# Patient Record
Sex: Male | Born: 1940 | Race: White | Hispanic: No | Marital: Married | Smoking: Never smoker
Health system: Southern US, Community
[De-identification: ages and names within clinical notes are randomized; demographics above are authoritative.]

## PROBLEM LIST (undated history)

## (undated) DIAGNOSIS — B029 Zoster without complications: Secondary | ICD-10-CM

## (undated) DIAGNOSIS — R197 Diarrhea, unspecified: Secondary | ICD-10-CM

## (undated) DIAGNOSIS — M1990 Unspecified osteoarthritis, unspecified site: Secondary | ICD-10-CM

## (undated) DIAGNOSIS — I10 Essential (primary) hypertension: Secondary | ICD-10-CM

## (undated) DIAGNOSIS — E785 Hyperlipidemia, unspecified: Secondary | ICD-10-CM

## (undated) DIAGNOSIS — R339 Retention of urine, unspecified: Secondary | ICD-10-CM

## (undated) DIAGNOSIS — R7309 Other abnormal glucose: Secondary | ICD-10-CM

## (undated) DIAGNOSIS — J189 Pneumonia, unspecified organism: Secondary | ICD-10-CM

## (undated) DIAGNOSIS — L309 Dermatitis, unspecified: Secondary | ICD-10-CM

## (undated) DIAGNOSIS — N189 Chronic kidney disease, unspecified: Secondary | ICD-10-CM

## (undated) DIAGNOSIS — K529 Noninfective gastroenteritis and colitis, unspecified: Secondary | ICD-10-CM

## (undated) HISTORY — DX: Other abnormal glucose: R73.09

## (undated) HISTORY — DX: Hyperlipidemia, unspecified: E78.5

## (undated) HISTORY — DX: Essential (primary) hypertension: I10

## (undated) HISTORY — DX: Noninfective gastroenteritis and colitis, unspecified: K52.9

## (undated) HISTORY — PX: TONSILLECTOMY: SUR1361

## (undated) HISTORY — DX: Zoster without complications: B02.9

## (undated) HISTORY — DX: Retention of urine, unspecified: R33.9

## (undated) HISTORY — DX: Dermatitis, unspecified: L30.9

## (undated) HISTORY — DX: Diarrhea, unspecified: R19.7

---

## 2005-03-01 ENCOUNTER — Ambulatory Visit: Payer: Self-pay | Admitting: Gastroenterology

## 2005-03-15 ENCOUNTER — Ambulatory Visit: Payer: Self-pay | Admitting: Gastroenterology

## 2005-03-15 ENCOUNTER — Encounter (INDEPENDENT_AMBULATORY_CARE_PROVIDER_SITE_OTHER): Payer: Self-pay | Admitting: Specialist

## 2005-04-13 ENCOUNTER — Ambulatory Visit: Payer: Self-pay | Admitting: Gastroenterology

## 2005-05-18 ENCOUNTER — Ambulatory Visit: Payer: Self-pay | Admitting: Gastroenterology

## 2005-06-14 ENCOUNTER — Ambulatory Visit: Payer: Self-pay | Admitting: Gastroenterology

## 2005-07-15 ENCOUNTER — Ambulatory Visit: Payer: Self-pay | Admitting: Gastroenterology

## 2005-09-09 ENCOUNTER — Ambulatory Visit: Payer: Self-pay | Admitting: Internal Medicine

## 2005-11-28 ENCOUNTER — Ambulatory Visit: Payer: Self-pay | Admitting: Internal Medicine

## 2005-11-28 LAB — CONVERTED CEMR LAB
BUN: 13 mg/dL (ref 6–23)
HDL: 41.2 mg/dL (ref >39.0)
LDL DIRECT: 154.6 mg/dL
PSA: 0.42 ng/mL (ref 0.10–4.00)
Potassium: 4.5 meq/L (ref 3.5–5.1)
Sed Rate: 10 mm/hr (ref 0–20)
Sodium: 139 meq/L (ref 135–145)
Triglyceride fasting, serum: 384 mg/dL (ref 0–149)

## 2005-12-09 ENCOUNTER — Ambulatory Visit: Payer: Self-pay | Admitting: Internal Medicine

## 2006-03-17 ENCOUNTER — Ambulatory Visit: Payer: Self-pay | Admitting: Internal Medicine

## 2006-06-16 ENCOUNTER — Ambulatory Visit: Payer: Self-pay | Admitting: Internal Medicine

## 2006-06-16 LAB — CONVERTED CEMR LAB
Chloride: 101 meq/L (ref 96–112)
Creatinine, Ser: 0.9 mg/dL (ref 0.4–1.5)
GFR calc non Af Amer: 89 mL/min
Glucose, Bld: 106 mg/dL — ABNORMAL HIGH (ref 70–99)
Potassium: 4.3 meq/L (ref 3.5–5.1)
Sodium: 141 meq/L (ref 135–145)

## 2006-06-23 ENCOUNTER — Ambulatory Visit: Payer: Self-pay | Admitting: Internal Medicine

## 2006-11-09 ENCOUNTER — Encounter: Payer: Self-pay | Admitting: *Deleted

## 2006-11-09 DIAGNOSIS — I10 Essential (primary) hypertension: Secondary | ICD-10-CM

## 2006-12-08 ENCOUNTER — Ambulatory Visit: Payer: Self-pay | Admitting: Internal Medicine

## 2007-01-04 DIAGNOSIS — B029 Zoster without complications: Secondary | ICD-10-CM

## 2007-01-04 HISTORY — DX: Zoster without complications: B02.9

## 2007-06-08 ENCOUNTER — Ambulatory Visit: Payer: Self-pay | Admitting: Internal Medicine

## 2007-11-17 ENCOUNTER — Ambulatory Visit: Payer: Self-pay | Admitting: Family Medicine

## 2007-11-17 ENCOUNTER — Telehealth: Payer: Self-pay | Admitting: Family Medicine

## 2007-11-17 DIAGNOSIS — B029 Zoster without complications: Secondary | ICD-10-CM | POA: Insufficient documentation

## 2007-11-19 ENCOUNTER — Ambulatory Visit: Payer: Self-pay | Admitting: Internal Medicine

## 2007-11-19 DIAGNOSIS — R531 Weakness: Secondary | ICD-10-CM

## 2007-11-26 ENCOUNTER — Telehealth: Payer: Self-pay | Admitting: Internal Medicine

## 2007-11-30 ENCOUNTER — Telehealth: Payer: Self-pay | Admitting: Family Medicine

## 2007-12-05 ENCOUNTER — Telehealth: Payer: Self-pay | Admitting: Internal Medicine

## 2008-01-10 ENCOUNTER — Encounter: Payer: Self-pay | Admitting: Internal Medicine

## 2008-01-25 ENCOUNTER — Ambulatory Visit: Payer: Self-pay | Admitting: Internal Medicine

## 2008-01-29 LAB — CONVERTED CEMR LAB
ALT: 25 units/L (ref 0–53)
AST: 24 units/L (ref 0–37)
Alkaline Phosphatase: 34 units/L — ABNORMAL LOW (ref 39–117)
CO2: 29 meq/L (ref 19–32)
Chloride: 100 meq/L (ref 96–112)
Cholesterol: 245 mg/dL (ref 0–200)
GFR calc non Af Amer: 79 mL/min
Potassium: 3.9 meq/L (ref 3.5–5.1)
Total Bilirubin: 0.8 mg/dL (ref 0.3–1.2)
Total CHOL/HDL Ratio: 6.4
VLDL: 69 mg/dL — ABNORMAL HIGH (ref 0–40)

## 2008-02-01 ENCOUNTER — Ambulatory Visit: Payer: Self-pay | Admitting: Internal Medicine

## 2008-02-01 DIAGNOSIS — E785 Hyperlipidemia, unspecified: Secondary | ICD-10-CM

## 2008-02-01 DIAGNOSIS — R7309 Other abnormal glucose: Secondary | ICD-10-CM | POA: Insufficient documentation

## 2008-02-28 ENCOUNTER — Encounter: Payer: Self-pay | Admitting: Internal Medicine

## 2008-03-11 ENCOUNTER — Encounter: Payer: Self-pay | Admitting: Internal Medicine

## 2008-04-15 ENCOUNTER — Ambulatory Visit: Payer: Self-pay | Admitting: Internal Medicine

## 2008-05-23 ENCOUNTER — Ambulatory Visit: Payer: Self-pay | Admitting: Internal Medicine

## 2008-05-26 LAB — CONVERTED CEMR LAB
ALT: 26 units/L (ref 0–53)
AST: 22 units/L (ref 0–37)
Albumin: 4.7 g/dL (ref 3.5–5.2)
BUN: 22 mg/dL (ref 6–23)
CO2: 28 meq/L (ref 19–32)
Chloride: 104 meq/L (ref 96–112)
Cholesterol: 262 mg/dL — ABNORMAL HIGH (ref 0–200)
Creatinine, Ser: 1.1 mg/dL (ref 0.4–1.5)
Glucose, Bld: 101 mg/dL — ABNORMAL HIGH (ref 70–99)
Potassium: 4.1 meq/L (ref 3.5–5.1)
Total Bilirubin: 0.7 mg/dL (ref 0.3–1.2)
Total CHOL/HDL Ratio: 6
Total Protein: 7.1 g/dL (ref 6.0–8.3)

## 2008-05-30 ENCOUNTER — Ambulatory Visit: Payer: Self-pay | Admitting: Internal Medicine

## 2008-05-30 DIAGNOSIS — L298 Other pruritus: Secondary | ICD-10-CM

## 2008-07-28 ENCOUNTER — Encounter: Payer: Self-pay | Admitting: Internal Medicine

## 2008-11-28 ENCOUNTER — Ambulatory Visit: Payer: Self-pay | Admitting: Internal Medicine

## 2008-12-01 LAB — CONVERTED CEMR LAB
BUN: 27 mg/dL — ABNORMAL HIGH (ref 6–23)
Basophils Absolute: 0.1 10*3/uL (ref 0.0–0.1)
Basophils Relative: 0.9 % (ref 0.0–3.0)
CO2: 25 meq/L (ref 19–32)
Calcium: 9.6 mg/dL (ref 8.4–10.5)
Chloride: 105 meq/L (ref 96–112)
Cholesterol: 311 mg/dL — ABNORMAL HIGH (ref 0–200)
Creatinine, Ser: 1.2 mg/dL (ref 0.4–1.5)
Direct LDL: 168.8 mg/dL
Eosinophils Absolute: 0.1 10*3/uL (ref 0.0–0.7)
Eosinophils Relative: 1.4 % (ref 0.0–5.0)
GFR calc non Af Amer: 63.58 mL/min (ref >60)
Glucose, Bld: 114 mg/dL — ABNORMAL HIGH (ref 70–99)
HCT: 34.2 % — ABNORMAL LOW (ref 39.0–52.0)
HDL: 43 mg/dL (ref >39.00)
Hemoglobin: 11.9 g/dL — ABNORMAL LOW (ref 13.0–17.0)
Hgb A1c MFr Bld: 5.6 % (ref 4.6–6.5)
Lymphocytes Relative: 19.5 % (ref 12.0–46.0)
Lymphs Abs: 1.5 10*3/uL (ref 0.7–4.0)
MCHC: 34.7 g/dL (ref 30.0–36.0)
MCV: 98 fL (ref 78.0–100.0)
Monocytes Absolute: 0.6 10*3/uL (ref 0.1–1.0)
Monocytes Relative: 7.3 % (ref 3.0–12.0)
Neutro Abs: 5.4 10*3/uL (ref 1.4–7.7)
Neutrophils Relative %: 70.9 % (ref 43.0–77.0)
PSA: 0.52 ng/mL (ref 0.10–4.00)
Platelets: 203 10*3/uL (ref 150.0–400.0)
Potassium: 4.5 meq/L (ref 3.5–5.1)
RBC: 3.49 M/uL — ABNORMAL LOW (ref 4.22–5.81)
RDW: 12.1 % (ref 11.5–14.6)
Sodium: 140 meq/L (ref 135–145)
TSH: 0.58 microintl units/mL (ref 0.35–5.50)
Total CHOL/HDL Ratio: 7
Triglycerides: 418 mg/dL — ABNORMAL HIGH (ref 0.0–149.0)
VLDL: 83.6 mg/dL — ABNORMAL HIGH (ref 0.0–40.0)
WBC: 7.7 10*3/uL (ref 4.5–10.5)

## 2008-12-05 ENCOUNTER — Ambulatory Visit: Payer: Self-pay | Admitting: Internal Medicine

## 2009-03-20 ENCOUNTER — Ambulatory Visit: Payer: Self-pay | Admitting: Internal Medicine

## 2009-03-23 LAB — CONVERTED CEMR LAB
Calcium: 9.1 mg/dL (ref 8.4–10.5)
Cholesterol: 251 mg/dL — ABNORMAL HIGH (ref 0–200)
Creatinine, Ser: 1.4 mg/dL (ref 0.4–1.5)
Triglycerides: 370 mg/dL — ABNORMAL HIGH (ref 0.0–149.0)

## 2009-03-27 ENCOUNTER — Ambulatory Visit: Payer: Self-pay | Admitting: Internal Medicine

## 2009-03-27 DIAGNOSIS — R197 Diarrhea, unspecified: Secondary | ICD-10-CM

## 2009-08-24 ENCOUNTER — Telehealth: Payer: Self-pay | Admitting: Internal Medicine

## 2009-09-25 ENCOUNTER — Ambulatory Visit: Payer: Self-pay | Admitting: Internal Medicine

## 2009-09-25 LAB — CONVERTED CEMR LAB
ALT: 22 units/L (ref 0–53)
BUN: 24 mg/dL — ABNORMAL HIGH (ref 6–23)
CO2: 24 meq/L (ref 19–32)
Chloride: 104 meq/L (ref 96–112)
Cholesterol: 255 mg/dL — ABNORMAL HIGH (ref 0–200)
Creatinine, Ser: 1.3 mg/dL (ref 0.4–1.5)
Direct LDL: 144.8 mg/dL
Total Protein: 6.7 g/dL (ref 6.0–8.3)
Triglycerides: 265 mg/dL — ABNORMAL HIGH (ref 0.0–149.0)

## 2009-10-02 ENCOUNTER — Ambulatory Visit: Payer: Self-pay | Admitting: Internal Medicine

## 2010-01-20 ENCOUNTER — Telehealth: Payer: Self-pay | Admitting: Internal Medicine

## 2010-02-02 NOTE — Assessment & Plan Note (Signed)
Summary: 3 MONTH FOLLOW UP   Vital Signs:  Patient profile:   70 year old male Weight:      176 pounds BMI:     3449.69 Temp:     97.8 degrees F oral Pulse rate:   83 / minute BP sitting:   132 / 74  (left arm)  Vitals Entered By: Tora Perches (March 27, 2009 3:42 PM) CC: f/u Is Patient Diabetic? No   Primary Care Provider:  Tresa Garter MD  CC:  f/u.  History of Present Illness: The patient presents for a follow up of hypertension, rash, hyperlipidemia   Preventive Screening-Counseling & Management  Alcohol-Tobacco     Smoking Status: never  Current Medications (verified): 1)  Verapamil Hcl Cr 240 Mg  Tbcr (Verapamil Hcl) .... Take One Tablet Once Daily 2)  Chlorthalidone 100 Mg  Tabs (Chlorthalidone) .... Take One Tablet Once Daily 3)  Welchol 625 Mg  Tabs (Colesevelam Hcl) .... 3 Two Times A Day By Mouth 4)  Lisinopril 40 Mg Tabs (Lisinopril) .Marland Kitchen.. 1 By Mouth Once Daily 5)  Vitamin D3 1000 Unit  Tabs (Cholecalciferol) .Marland Kitchen.. 1 Qd 6)  Bactroban 2 % Oint (Mupirocin) .... Apply To Affected Area Two Times A Day As Needed 7)  Triamcinolone Acetonide 0.1 % Oint (Triamcinolone Acetonide) .... Use 1-2 Times A Day  Allergies (verified): No Known Drug Allergies  Past History:  Past Medical History: Last updated: 02/01/2008 HYPERTENSION (ICD-401.9) COLITIS, HX OF (ICD-V12.79) H zoster 2009 Elev glu  Hyperlipidemia  Social History: Last updated: 12/05/2008 Occupation:- car dealership sales Married Never Smoked  Physical Exam  General:  alert, well-developed, well-nourished, and cooperative to examination.    Nose:  External nasal examination shows no deformity or inflammation. Nasal mucosa are pink and moist without lesions or exudates. Mouth:  Oral mucosa and oropharynx without lesions or exudates.  Teeth in good repair. Lungs:  normal respiratory effort, no intercostal retractions, no accessory muscle use, no wheezes, and L base crackles.   Heart:  normal  rate, regular rhythm, no murmur, and no rub. BLE without edema. normal DP pulses and normal cap refill in all 4 extremities    Abdomen:  Bowel sounds positive,abdomen soft and non-tender without masses, organomegaly or hernias noted. Msk:  No deformity or scoliosis noted of thoracic or lumbar spine.   Extremities:  No clubbing, cyanosis, edema, or deformity noted with normal full range of motion of all joints.   Neurologic:  No cranial nerve deficits noted. Station and gait are normal. Plantar reflexes are down-going bilaterally. DTRs are symmetrical throughout. Sensory, motor and coordinative functions appear intact. Skin:  Erythematous throat mucosa and intranasal erythema. patches on face and scalp  Cervical Nodes:  No lymphadenopathy noted Psych:  Cognition and judgment appear intact. Alert and cooperative with normal attention span and concentration. No apparent delusions, illusions, hallucinations   Impression & Recommendations:  Problem # 1:  HYPERLIPIDEMIA (ICD-272.4) Assessment Improved  His updated medication list for this problem includes:    Welchol 625 Mg Tabs (Colesevelam hcl) .Marland KitchenMarland KitchenMarland KitchenMarland Kitchen 3 two times a day by mouth Refusing statins Labs Reviewed: SGOT: 22 (05/23/2008)   SGPT: 26 (05/23/2008)   HDL:42.80 (03/20/2009), 43.00 (11/28/2008)  LDL:DEL (01/25/2008), DEL (11/28/2005)  Chol:251 (03/20/2009), 311 (11/28/2008)  Trig:370.0 (03/20/2009), 418.0 (11/28/2008)  Problem # 2:  FACIAL RASH (ICD-782.1) Assessment: Improved  His updated medication list for this problem includes:    Triamcinolone Acetonide 0.1 % Oint (Triamcinolone acetonide) ..... Use 1-2 times a day  Problem #  3:  HYPERTENSION (ICD-401.9) Assessment: Improved  His updated medication list for this problem includes:    Verapamil Hcl Cr 240 Mg Tbcr (Verapamil hcl) .Marland Kitchen... Take one tablet once daily    Chlorthalidone 100 Mg Tabs (Chlorthalidone) .Marland Kitchen... Take one tablet once daily    Lisinopril 40 Mg Tabs (Lisinopril)  .Marland Kitchen... 1 by mouth once daily  Problem # 4:  DIARRHEA, CHRONIC (ICD-787.91) Assessment: Improved On prescription/OTC  therapy   Complete Medication List: 1)  Verapamil Hcl Cr 240 Mg Tbcr (Verapamil hcl) .... Take one tablet once daily 2)  Chlorthalidone 100 Mg Tabs (Chlorthalidone) .... Take one tablet once daily 3)  Welchol 625 Mg Tabs (Colesevelam hcl) .... 3 two times a day by mouth 4)  Lisinopril 40 Mg Tabs (Lisinopril) .Marland Kitchen.. 1 by mouth once daily 5)  Vitamin D3 1000 Unit Tabs (Cholecalciferol) .Marland Kitchen.. 1 qd 6)  Bactroban 2 % Oint (Mupirocin) .... Apply to affected area two times a day as needed 7)  Triamcinolone Acetonide 0.1 % Oint (Triamcinolone acetonide) .... Use 1-2 times a day  Patient Instructions: 1)  Please schedule a follow-up appointment in 6 months. 2)  BMP prior to visit, ICD-9: 3)  Hepatic Panel prior to visit, ICD-9: 4)  Lipid Panel prior to visit, ICD-9:272.2

## 2010-02-02 NOTE — Progress Notes (Signed)
Summary: Rf Bactroban  Phone Note Refill Request   Refills Requested: Medication #1:  BACTROBAN 2 % OINT apply to affected area two times a day as needed   Dosage confirmed as above?Dosage Confirmed   Supply Requested: 44 gram orig RX was in 2010   Method Requested: Electronic Initial call taken by: Lanier Prude, [facility]),  August 24, 2009 5:14 PM  Follow-up for Phone Call        ok 3 ref Follow-up by: Tresa Garter MD,  August 24, 2009 5:37 PM    Prescriptions: BACTROBAN 2 % OINT (MUPIROCIN) apply to affected area two times a day as needed  #45 g x 3   Entered by:   Lucious Groves CMA   Authorized by:   Tresa Garter MD   Signed by:   Lucious Groves CMA on 08/25/2009   Method used:   [facility] to        [facility]  [phone]* (retail)       [address] [place], [place]  [address]       Ph: [phone] or [phone]       Fax: [phone]   RxID:   [id number]

## 2010-02-02 NOTE — Assessment & Plan Note (Signed)
Summary: 6 MO ROV /NWS #   Vital Signs:  Patient profile:   70 year old male Height:      72 inches Weight:      177 pounds BMI:     24.09 Temp:     97.4 degrees F oral Pulse rate:   88 / minute Pulse rhythm:   regular Resp:     16 per minute BP sitting:   140 / 78  (left arm) Cuff size:   regular  Vitals Entered By: Lanier Prude, CMA(AAMA) (October 02, 2009 10:38 AM) CC: 6 mo f/u Is Patient Diabetic? No   Primary Care Provider:  Tresa Garter MD  CC:  6 mo f/u.  History of Present Illness: The patient presents for a follow up of hypertension, diarrhea, hyperlipidemia  Preventive Screening-Counseling & Management  Alcohol-Tobacco     Smoking Status: never  Current Medications (verified): 1)  Verapamil Hcl Cr 240 Mg  Tbcr (Verapamil Hcl) .... Take One Tablet Once Daily 2)  Chlorthalidone 100 Mg  Tabs (Chlorthalidone) .... Take One Tablet Once Daily 3)  Welchol 625 Mg  Tabs (Colesevelam Hcl) .... 3 Two Times A Day By Mouth 4)  Lisinopril 40 Mg Tabs (Lisinopril) .Marland Kitchen.. 1 By Mouth Once Daily 5)  Vitamin D3 1000 Unit  Tabs (Cholecalciferol) .Marland Kitchen.. 1 Qd 6)  Bactroban 2 % Oint (Mupirocin) .... Apply To Affected Area Two Times A Day As Needed 7)  Triamcinolone Acetonide 0.1 % Oint (Triamcinolone Acetonide) .... Use 1-2 Times A Day  Allergies (verified): No Known Drug Allergies  Past History:  Past Medical History: Last updated: 02/01/2008 HYPERTENSION (ICD-401.9) COLITIS, HX OF (ICD-V12.79) H zoster 2009 Elev glu  Hyperlipidemia  Social History: Last updated: 12/05/2008 Occupation:- car dealership sales Married Never Smoked  Review of Systems  The patient denies abdominal pain, chest pain, syncope, and dyspnea on exertion.    Physical Exam  General:  alert, well-developed, well-nourished, and cooperative to examination.    Mouth:  Oral mucosa and oropharynx without lesions or exudates.  Teeth in good repair. Heart:  normal rate, regular rhythm, no  murmur, and no rub. BLE without edema. normal DP pulses and normal cap refill in all 4 extremities    Abdomen:  Bowel sounds positive,abdomen soft and non-tender without masses, organomegaly or hernias noted. Msk:  No deformity or scoliosis noted of thoracic or lumbar spine.   Extremities:  No clubbing, cyanosis, edema, or deformity noted with normal full range of motion of all joints.   Skin:  erythematous  patches on face and scalp  Psych:  Cognition and judgment appear intact. Alert and cooperative with normal attention span and concentration. No apparent delusions, illusions, hallucinations   Impression & Recommendations:  Problem # 1:  HYPERLIPIDEMIA (ICD-272.4) Assessment Unchanged  Not interested in other Rx His updated medication list for this problem includes:    Welchol 625 Mg Tabs (Colesevelam hcl) .Marland KitchenMarland KitchenMarland KitchenMarland Kitchen 3 two times a day by mouth  Labs Reviewed: SGOT: 21 (09/25/2009)   SGPT: 22 (09/25/2009)   HDL:41.70 (09/25/2009), 42.80 (03/20/2009)  LDL:DEL (01/25/2008), DEL (11/28/2005)  Chol:255 (09/25/2009), 251 (03/20/2009)  Trig:265.0 (09/25/2009), 370.0 (03/20/2009)  Problem # 2:  FACIAL RASH (ICD-782.1) Assessment: Improved  His updated medication list for this problem includes:    Triamcinolone Acetonide 0.1 % Oint (Triamcinolone acetonide) ..... Use 1-2 times a day  Problem # 3:  ABNORMAL GLUCOSE NEC (ICD-790.29) Assessment: Improved  Problem # 4:  COLITIS, HX OF (ICD-V12.79) Assessment: Unchanged  Problem # 5:  HYPERTENSION (ICD-401.9) Assessment: Unchanged  His updated medication list for this problem includes:    Verapamil Hcl Cr 240 Mg Tbcr (Verapamil hcl) .Marland Kitchen... Take one tablet once daily    Chlorthalidone 100 Mg Tabs (Chlorthalidone) .Marland Kitchen... Take one tablet once daily    Lisinopril 40 Mg Tabs (Lisinopril) .Marland Kitchen... 1 by mouth once daily  Complete Medication List: 1)  Verapamil Hcl Cr 240 Mg Tbcr (Verapamil hcl) .... Take one tablet once daily 2)  Chlorthalidone 100 Mg  Tabs (Chlorthalidone) .... Take one tablet once daily 3)  Welchol 625 Mg Tabs (Colesevelam hcl) .... 3 two times a day by mouth 4)  Lisinopril 40 Mg Tabs (Lisinopril) .Marland Kitchen.. 1 by mouth once daily 5)  Vitamin D3 1000 Unit Tabs (Cholecalciferol) .Marland Kitchen.. 1 qd 6)  Bactroban 2 % Oint (Mupirocin) .... Apply to affected area two times a day as needed 7)  Triamcinolone Acetonide 0.1 % Oint (Triamcinolone acetonide) .... Use 1-2 times a day  Other Orders: EKG w/ Interpretation (93000)  Patient Instructions: 1)  Please schedule a follow-up appointment in 6 months well w/labs.  Contraindications/Deferment of Procedures/Staging:    Test/Procedure: FLU VAX    Reason for deferment: patient declined     Test/Procedure: Pneumovax vaccine    Reason for deferment: patient declined     Test/Procedure: DPT vaccine    Reason for deferment: declined  Prescriptions: BACTROBAN 2 % OINT (MUPIROCIN) apply to affected area two times a day as needed  #45 g x 3   Entered and Authorized by:   Tresa Garter MD   Signed by:   Tresa Garter MD on 10/02/2009   Method used:   Print then Give to Patient   RxID:   [id number] TRIAMCINOLONE ACETONIDE 0.1 % OINT (TRIAMCINOLONE ACETONIDE) Use 1-2 times a day  #120 g x 3   Entered and Authorized by:   Tresa Garter MD   Signed by:   Tresa Garter MD on 10/02/2009   Method used:   Print then Give to Patient   RxID:   [id number] LISINOPRIL 40 MG TABS (LISINOPRIL) 1 by mouth once daily  #30 Tablet x 12   Entered and Authorized by:   Tresa Garter MD   Signed by:   Tresa Garter MD on 10/02/2009   Method used:   Print then Give to Patient   RxID:   [id number] WELCHOL 625 MG  TABS (COLESEVELAM HCL) 3 two times a day by mouth  #180 Tablet x 12   Entered and Authorized by:   Tresa Garter MD   Signed by:   Tresa Garter MD on 10/02/2009   Method used:   Print then Give to Patient   RxID:    [id number] CHLORTHALIDONE 100 MG  TABS (CHLORTHALIDONE) Take one tablet once daily  #30 x 12   Entered and Authorized by:   Tresa Garter MD   Signed by:   Tresa Garter MD on 10/02/2009   Method used:   Print then Give to Patient   RxID:   [id number] VERAPAMIL HCL CR 240 MG  TBCR (VERAPAMIL HCL) Take one tablet once daily  #30 Tablet x 12   Entered and Authorized by:   Tresa Garter MD   Signed by:   Tresa Garter MD on 10/02/2009   Method used:   Print then Give to Patient   RxID:   [id number]    Not Administered:    Influenza Vaccine not given  due to: declined

## 2010-02-04 NOTE — Progress Notes (Signed)
Summary: med clarification  Phone Note From Pharmacy   [facility] [facility]  [phone] Summary of Call: Pharmacy called regarding rx for CHLORTHALIDONE 100 MG  TABS  twice daily. They state that he 100mg  is not available and comes as 50mg  tabs. They advised patient that a new rx for 50mg  two times a day is needed but pt informed them that MD discussed decreasing med down to one tab daily . Please clarify thanks.Alvy Beal Archie CMA  January 20, 2010 11:11 AM   Follow-up for Phone Call        Take 100 mg q am Follow-up by: Tresa Garter MD,  January 21, 2010 7:38 AM    New/Updated Medications: CHLORTHALIDONE 50 MG TABS (CHLORTHALIDONE) 2 by mouth once daily in am Prescriptions: CHLORTHALIDONE 50 MG TABS (CHLORTHALIDONE) 2 by mouth once daily in am  #180 x 1   Entered by:   Lamar Sprinkles, CMA   Authorized by:   Tresa Garter MD   Signed by:   Lamar Sprinkles, CMA on 01/21/2010   Method used:   Electronically to        [facility]  [phone]* (retail)       [address] [place], [place]  [address]       Ph: [phone] or [phone]       Fax: [phone]   RxID:   [id number]

## 2010-03-29 ENCOUNTER — Other Ambulatory Visit: Payer: Self-pay | Admitting: *Deleted

## 2010-03-29 DIAGNOSIS — Z0389 Encounter for observation for other suspected diseases and conditions ruled out: Secondary | ICD-10-CM

## 2010-03-29 DIAGNOSIS — Z0000 Encounter for general adult medical examination without abnormal findings: Secondary | ICD-10-CM

## 2010-03-30 ENCOUNTER — Other Ambulatory Visit (INDEPENDENT_AMBULATORY_CARE_PROVIDER_SITE_OTHER): Payer: Medicare Other

## 2010-03-30 DIAGNOSIS — Z0000 Encounter for general adult medical examination without abnormal findings: Secondary | ICD-10-CM

## 2010-03-30 DIAGNOSIS — Z0389 Encounter for observation for other suspected diseases and conditions ruled out: Secondary | ICD-10-CM

## 2010-04-02 ENCOUNTER — Other Ambulatory Visit: Payer: Medicare Other

## 2010-04-02 LAB — CBC WITH DIFFERENTIAL/PLATELET
Eosinophils Relative: 3 % (ref 0.0–5.0)
MCV: 96.1 fl (ref 78.0–100.0)
Monocytes Absolute: 0.7 10*3/uL (ref 0.1–1.0)
Neutrophils Relative %: 65.6 % (ref 43.0–77.0)
Platelets: 184 10*3/uL (ref 150.0–400.0)
WBC: 8 10*3/uL (ref 4.5–10.5)

## 2010-04-02 LAB — BASIC METABOLIC PANEL
BUN: 40 mg/dL — ABNORMAL HIGH (ref 6–23)
Calcium: 9.2 mg/dL (ref 8.4–10.5)
Chloride: 112 mEq/L (ref 96–112)
Creatinine, Ser: 1.8 mg/dL — ABNORMAL HIGH (ref 0.4–1.5)
GFR: 40.18 mL/min — ABNORMAL LOW (ref >60.00)

## 2010-04-02 LAB — URINALYSIS
Bilirubin Urine: NEGATIVE
Hgb urine dipstick: NEGATIVE
Ketones, ur: NEGATIVE
Urine Glucose: NEGATIVE
Urobilinogen, UA: 0.2 (ref 0.0–1.0)

## 2010-04-02 LAB — HEPATIC FUNCTION PANEL
ALT: 23 U/L (ref 0–53)
Bilirubin, Direct: 0 mg/dL (ref 0.0–0.3)
Total Bilirubin: 0.2 mg/dL — ABNORMAL LOW (ref 0.3–1.2)

## 2010-04-02 LAB — LIPID PANEL
HDL: 34.7 mg/dL — ABNORMAL LOW (ref >39.00)
Total CHOL/HDL Ratio: 6
Triglycerides: 258 mg/dL — ABNORMAL HIGH (ref 0.0–149.0)

## 2010-04-02 LAB — PSA: PSA: 0.41 ng/mL (ref 0.10–4.00)

## 2010-04-06 ENCOUNTER — Ambulatory Visit (INDEPENDENT_AMBULATORY_CARE_PROVIDER_SITE_OTHER): Payer: Medicare Other | Admitting: Internal Medicine

## 2010-04-06 ENCOUNTER — Encounter: Payer: Self-pay | Admitting: Internal Medicine

## 2010-04-06 DIAGNOSIS — D509 Iron deficiency anemia, unspecified: Secondary | ICD-10-CM | POA: Insufficient documentation

## 2010-04-06 DIAGNOSIS — I10 Essential (primary) hypertension: Secondary | ICD-10-CM

## 2010-04-06 DIAGNOSIS — R21 Rash and other nonspecific skin eruption: Secondary | ICD-10-CM

## 2010-04-06 DIAGNOSIS — D6489 Other specified anemias: Secondary | ICD-10-CM

## 2010-04-06 DIAGNOSIS — E785 Hyperlipidemia, unspecified: Secondary | ICD-10-CM

## 2010-04-06 DIAGNOSIS — R197 Diarrhea, unspecified: Secondary | ICD-10-CM

## 2010-04-06 MED ORDER — ACYCLOVIR 400 MG PO TABS: 400.0000 mg | ORAL_TABLET | Freq: Two times a day (BID) | ORAL | Status: DC

## 2010-04-06 NOTE — Assessment & Plan Note (Signed)
Recurrent - better now after a flare up

## 2010-04-06 NOTE — Assessment & Plan Note (Signed)
On Rx - cont meds

## 2010-04-06 NOTE — Progress Notes (Signed)
  Subjective:    Patient ID: Marvin Morrison, male    DOB: [birth year], 70 y.o.   MRN: [id number]  HPI  The patient presents for a follow-up of  chronic hypertension, chronic dyslipidemia, diarrhea controlled with medicines. His aunt died in 04-06-2022 and he had a flare up of diarrhea and rash.    Review of Systems  Constitutional: Negative for appetite change and fatigue.  HENT: Negative for sneezing and neck pain.   Eyes: Negative for itching.  Respiratory: Negative for cough.   Cardiovascular: Negative for leg swelling.  Gastrointestinal: Positive for diarrhea. Negative for nausea, abdominal pain and blood in stool.  Genitourinary: Negative for difficulty urinating.  Musculoskeletal: Negative for arthralgias.  Skin: Positive for rash. Negative for color change.  Neurological: Negative for dizziness and syncope.  Psychiatric/Behavioral: Negative for dysphoric mood.       Objective:   Physical Exam  Constitutional: He is oriented to person, place, and time. He appears well-developed.  HENT:  Mouth/Throat: Oropharynx is clear and moist.  Eyes: Conjunctivae are normal. Pupils are equal, round, and reactive to light.  Neck: Normal range of motion. No JVD present. No thyromegaly present.  Cardiovascular: Normal rate, regular rhythm, normal heart sounds and intact distal pulses.  Exam reveals no gallop and no friction rub.   No murmur heard. Pulmonary/Chest: Effort normal and breath sounds normal. No respiratory distress. He has no wheezes. He has no rales. He exhibits no tenderness.  Abdominal: Soft. Bowel sounds are normal. He exhibits no distension and no mass. There is no tenderness. There is no rebound and no guarding.  Musculoskeletal: Normal range of motion. He exhibits no edema and no tenderness.  Lymphadenopathy:    He has no cervical adenopathy.  Neurological: He is alert and oriented to person, place, and time. He has normal reflexes. No cranial nerve deficit. He exhibits normal  muscle tone. Coordination normal.  Skin: Skin is warm and dry. Rash (on face) noted.  Psychiatric: He has a normal mood and affect. His behavior is normal. Judgment and thought content normal.          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 Will get a Celiac panel  He refused skin biopsy  HYPERTENSION Controlled - cont Rx  HYPERLIPIDEMIA On Rx - cont meds  Anemia, worse  Check labs  Lab Results  Component Value Date   WBC 8.0 04/02/2010   HGB 9.9* 04/02/2010   HCT 29.2* 04/02/2010   PLT 184.0 04/02/2010   CHOL 204* 04/02/2010   TRIG 258.0* 04/02/2010   HDL 34.70* 04/02/2010   LDLDIRECT 111.3 04/02/2010   ALT 23 04/02/2010   AST 16 04/02/2010   NA 140 04/02/2010   K 4.5 04/02/2010   CL 112 04/02/2010   CREATININE 1.8* 04/02/2010   BUN 40* 04/02/2010   CO2 20 04/02/2010   TSH 1.07 04/02/2010   PSA 0.41 04/02/2010   HGBA1C 5.6 11/28/2008   Wt Readings from Last 3 Encounters:  04/06/10 163 lb (73.936 kg)  10/02/09 177 lb (80.287 kg)  03/27/09 176 lb (79.833 kg)   Total time over 45 mins > 50% spent counseling patient with regards to the problems above, coordination of care and treatment options.

## 2010-04-06 NOTE — Assessment & Plan Note (Signed)
Controlled - cont Rx

## 2010-04-06 NOTE — Assessment & Plan Note (Addendum)
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

## 2010-05-18 ENCOUNTER — Other Ambulatory Visit: Payer: Self-pay | Admitting: Internal Medicine

## 2010-05-21 NOTE — Assessment & Plan Note (Signed)
[facility]                             PRIMARY CARE OFFICE NOTE   Marvin Morrison, Marvin Morrison                        MRN:          [id number]  DATE:09/09/2005                            DOB:          08-12-1940    The patient is a 70 year old male, new patient, presents to establish  primary with me and to address problems with his diarrhea, weight loss and  elevated blood pressure.   ALLERGIES:  None.   PAST MEDICAL HISTORY:  Diarrhea, diagnosed as collagenous colitis by Dr.  Arlyce Dice.  Weight loss due to the above results.  Hypertension.   FAMILY HISTORY:  Father died at age 69 with coronary disease.  Mother died  early of unknown cause.   SOCIAL HISTORY:  He is married with one daughter.  Occasional beer, does not  smoke.  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   CURRENT MEDICATIONS:  1. Lisinopril 20 mg daily.  2. Avalide 300/25 daily.   REVIEW OF SYSTEMS:  He has been physically very active.  Denies chest pain,  shortness of breath.  Continues to have occasional diarrhea. Mostly  manifested by urgency after meals.  No blood or mucus.  Weight loss has  resulted and he has gained back much of his weight.  No urinary symptoms.  The rest is negative.   PHYSICAL EXAMINATION:  VITAL SIGNS:  Blood pressure 160/90.  Pulse 96.  Temperature 98.3.  Weight 187 pounds.  GENERAL:  No in acute distress.  Looks well.  HEENT:  Moist mucosa.  NECK:  Supple.  No thyromegaly or bruit.  LUNGS:  Clear no wheezes or rales.  HEART:  S1, S2.  No gallop.  No murmur.  ABDOMEN:  Soft, nontender.  No organomegaly or mass palpable.  LOWER EXTREMITIES:  Without edema.  He is alert and cooperative.  Denies being depressed.  SKIN:  With aging changes.  He has some actinic keratosis lesions on his  forehead.   LABORATORY DATA:  Intestinal colon biopsy consistent with collagenous  colitis.  A colonoscopy in March 2007.  CBC normal on July 15, 2005 with  hemoglobin 12.5.   ASSESSMENT/PLAN:  1. 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  Given Welchol to try 1 twice a      day. May consider Flagyl.  Lomotil as needed.  2. Weight loss due to problem number 1 - resolved.  3. Diarrhea due to problem number 1.  Lomotil 1-2 t.i.d. as needed.  4. Hypertension.  5. I will see him back in three months with lab work.  We will try      verapamil XR 180 mg daily. Discontinue Avalide.  Continue Lisinopril.  6. Actinic keratosis, sun screen to use.  I will see him back in three      months, we will do cryosurgery.                                   Georgina Quint. Plotnikov, MD   AVP/MedQ  DD:  09/15/2005  DT:  09/16/2005  Job #:  [id number]

## 2010-07-06 ENCOUNTER — Other Ambulatory Visit (INDEPENDENT_AMBULATORY_CARE_PROVIDER_SITE_OTHER): Payer: Medicare Other

## 2010-07-06 DIAGNOSIS — D509 Iron deficiency anemia, unspecified: Secondary | ICD-10-CM

## 2010-07-06 DIAGNOSIS — I10 Essential (primary) hypertension: Secondary | ICD-10-CM

## 2010-07-06 DIAGNOSIS — R21 Rash and other nonspecific skin eruption: Secondary | ICD-10-CM

## 2010-07-06 DIAGNOSIS — D6489 Other specified anemias: Secondary | ICD-10-CM

## 2010-07-06 DIAGNOSIS — R197 Diarrhea, unspecified: Secondary | ICD-10-CM

## 2010-07-06 LAB — CBC WITH DIFFERENTIAL/PLATELET
Basophils Absolute: 0 10*3/uL (ref 0.0–0.1)
Eosinophils Absolute: 0.2 10*3/uL (ref 0.0–0.7)
Lymphocytes Relative: 24.8 % (ref 12.0–46.0)
MCHC: 34.7 g/dL (ref 30.0–36.0)
MCV: 96.9 fl (ref 78.0–100.0)
Monocytes Absolute: 0.6 10*3/uL (ref 0.1–1.0)
Neutrophils Relative %: 63.1 % (ref 43.0–77.0)
Platelets: 177 10*3/uL (ref 150.0–400.0)
RDW: 13.6 % (ref 11.5–14.6)

## 2010-07-06 LAB — BASIC METABOLIC PANEL
BUN: 26 mg/dL — ABNORMAL HIGH (ref 6–23)
GFR: 46.76 mL/min — ABNORMAL LOW (ref >60.00)
Potassium: 4.2 mEq/L (ref 3.5–5.1)
Sodium: 137 mEq/L (ref 135–145)

## 2010-07-06 LAB — SEDIMENTATION RATE: Sed Rate: 19 mm/hr (ref 0–22)

## 2010-07-06 LAB — TESTOSTERONE: Testosterone: 233.8 ng/dL — ABNORMAL LOW (ref 350.00–890.00)

## 2010-07-08 LAB — GLIADIN ANTIBODIES, SERUM: Gliadin IgA: 3.7 U/mL (ref <20)

## 2010-07-08 LAB — RETICULIN ANTIBODIES, IGA W TITER

## 2010-07-09 ENCOUNTER — Telehealth: Payer: Self-pay | Admitting: Internal Medicine

## 2010-07-09 LAB — PROTEIN ELECTROPHORESIS, SERUM
Albumin ELP: 66.7 % — ABNORMAL HIGH (ref 55.8–66.1)
Alpha-1-Globulin: 4 % (ref 2.9–4.9)
Total Protein, Serum Electrophoresis: 6.9 g/dL (ref 6.0–8.3)

## 2010-07-09 NOTE — Telephone Encounter (Signed)
Keep rov 

## 2010-07-13 ENCOUNTER — Encounter: Payer: Self-pay | Admitting: Internal Medicine

## 2010-07-13 ENCOUNTER — Ambulatory Visit (INDEPENDENT_AMBULATORY_CARE_PROVIDER_SITE_OTHER): Payer: Medicare Other | Admitting: Internal Medicine

## 2010-07-13 DIAGNOSIS — R5381 Other malaise: Secondary | ICD-10-CM

## 2010-07-13 DIAGNOSIS — I10 Essential (primary) hypertension: Secondary | ICD-10-CM

## 2010-07-13 DIAGNOSIS — R21 Rash and other nonspecific skin eruption: Secondary | ICD-10-CM

## 2010-07-13 DIAGNOSIS — R7309 Other abnormal glucose: Secondary | ICD-10-CM

## 2010-07-13 DIAGNOSIS — D6489 Other specified anemias: Secondary | ICD-10-CM

## 2010-07-13 DIAGNOSIS — R197 Diarrhea, unspecified: Secondary | ICD-10-CM

## 2010-07-13 DIAGNOSIS — N189 Chronic kidney disease, unspecified: Secondary | ICD-10-CM

## 2010-07-13 DIAGNOSIS — E291 Testicular hypofunction: Secondary | ICD-10-CM

## 2010-07-13 MED ORDER — VITAMIN D 1000 UNITS PO TABS: 1000.0000 [IU] | ORAL_TABLET | Freq: Every day | ORAL | Status: AC

## 2010-07-13 NOTE — Patient Instructions (Signed)
Wt Readings from Last 3 Encounters:  07/13/10 171 lb (77.565 kg)  04/06/10 163 lb (73.936 kg)  10/02/09 177 lb (80.287 kg)

## 2010-07-13 NOTE — Assessment & Plan Note (Signed)
Discussed.

## 2010-07-13 NOTE — Assessment & Plan Note (Signed)
Controlled w/Welchol

## 2010-07-13 NOTE — Assessment & Plan Note (Signed)
Renal consult offered - he refused

## 2010-07-13 NOTE — Progress Notes (Signed)
  Subjective:    Patient ID: Marvin Morrison, male    DOB: 1940-12-31, 70 y.o.   MRN: [id number]  HPI  The patient presents for a follow-up of  chronic hypertension, chronic dyslipidemia, diarrhea, CRI, wt loss and rash controlled with medicines    Review of Systems  Constitutional: Negative for appetite change, fatigue and unexpected weight change.  HENT: Negative for nosebleeds, congestion, sore throat, sneezing, trouble swallowing and neck pain.   Eyes: Negative for itching and visual disturbance.  Respiratory: Negative for cough.   Cardiovascular: Negative for chest pain, palpitations and leg swelling.  Gastrointestinal: Negative for nausea, diarrhea (2 stools a day), blood in stool and abdominal distention.  Genitourinary: Negative for frequency and hematuria.  Musculoskeletal: Negative for back pain, joint swelling and gait problem.  Skin: Positive for rash (recurrent).  Neurological: Negative for dizziness, tremors, speech difficulty and weakness.  Psychiatric/Behavioral: Negative for sleep disturbance, dysphoric mood and agitation. The patient is nervous/anxious.        Objective:   Physical Exam  Constitutional: He is oriented to person, place, and time. He appears well-developed.  HENT:  Head: Normocephalic.  Mouth/Throat: Oropharynx is clear and moist.  Eyes: Conjunctivae are normal. Pupils are equal, round, and reactive to light.  Neck: Normal range of motion. No JVD present. No thyromegaly present.  Cardiovascular: Normal rate, regular rhythm, normal heart sounds and intact distal pulses.  Exam reveals no gallop and no friction rub.   No murmur heard. Pulmonary/Chest: Effort normal and breath sounds normal. No respiratory distress. He has no wheezes. He has no rales. He exhibits no tenderness.  Abdominal: Soft. Bowel sounds are normal. He exhibits no distension and no mass. There is no tenderness. There is no rebound and no guarding.  Musculoskeletal: Normal range of  motion. He exhibits no edema and no tenderness.       Rash on face  Lymphadenopathy:    He has no cervical adenopathy.  Neurological: He is alert and oriented to person, place, and time. He has normal reflexes. No cranial nerve deficit. He exhibits normal muscle tone. Coordination normal.  Skin: Skin is warm and dry. Rash noted.       Rash on face  Psychiatric: He has a normal mood and affect. His behavior is normal. Judgment and thought content normal.        Lab Results  Component Value Date   WBC 6.6 07/06/2010   HGB 10.9* 07/06/2010   HCT 31.3* 07/06/2010   PLT 177.0 07/06/2010   CHOL 204* 04/02/2010   TRIG 258.0* 04/02/2010   HDL 34.70* 04/02/2010   LDLDIRECT 111.3 04/02/2010   ALT 23 04/02/2010   AST 16 04/02/2010   NA 137 07/06/2010   K 4.2 07/06/2010   CL 110 07/06/2010   CREATININE 1.6* 07/06/2010   BUN 26* 07/06/2010   CO2 21 07/06/2010   TSH 1.07 04/02/2010   PSA 0.41 04/02/2010   HGBA1C 5.6 11/28/2008     Assessment & Plan:

## 2010-07-13 NOTE — Assessment & Plan Note (Signed)
On Welchol

## 2010-07-13 NOTE — Assessment & Plan Note (Signed)
BP Readings from Last 3 Encounters:  07/13/10 110/72  04/06/10 120/70  10/02/09 140/78  On Rx

## 2010-07-13 NOTE — Assessment & Plan Note (Signed)
Better  

## 2010-07-13 NOTE — Assessment & Plan Note (Signed)
Monitoring

## 2010-07-13 NOTE — Assessment & Plan Note (Signed)
Tests for gluten intol were neg

## 2010-09-20 ENCOUNTER — Other Ambulatory Visit: Payer: Self-pay | Admitting: Internal Medicine

## 2010-10-05 ENCOUNTER — Other Ambulatory Visit: Payer: Self-pay | Admitting: Internal Medicine

## 2010-10-15 ENCOUNTER — Ambulatory Visit: Payer: Medicare Other | Admitting: Internal Medicine

## 2010-10-17 ENCOUNTER — Other Ambulatory Visit: Payer: Self-pay | Admitting: Internal Medicine

## 2010-11-01 ENCOUNTER — Other Ambulatory Visit (INDEPENDENT_AMBULATORY_CARE_PROVIDER_SITE_OTHER): Payer: Medicare Other

## 2010-11-01 DIAGNOSIS — D6489 Other specified anemias: Secondary | ICD-10-CM

## 2010-11-01 DIAGNOSIS — N189 Chronic kidney disease, unspecified: Secondary | ICD-10-CM

## 2010-11-01 DIAGNOSIS — R5383 Other fatigue: Secondary | ICD-10-CM

## 2010-11-01 DIAGNOSIS — R5381 Other malaise: Secondary | ICD-10-CM

## 2010-11-01 LAB — CBC WITH DIFFERENTIAL/PLATELET
Basophils Relative: 0.7 % (ref 0.0–3.0)
Eosinophils Relative: 2.5 % (ref 0.0–5.0)
HCT: 32.3 % — ABNORMAL LOW (ref 39.0–52.0)
Hemoglobin: 11.2 g/dL — ABNORMAL LOW (ref 13.0–17.0)
Lymphs Abs: 1.3 10*3/uL (ref 0.7–4.0)
Monocytes Relative: 9.3 % (ref 3.0–12.0)
Platelets: 188 10*3/uL (ref 150.0–400.0)
RBC: 3.32 Mil/uL — ABNORMAL LOW (ref 4.22–5.81)
WBC: 6.7 10*3/uL (ref 4.5–10.5)

## 2010-11-01 LAB — COMPREHENSIVE METABOLIC PANEL
Albumin: 4.3 g/dL (ref 3.5–5.2)
CO2: 22 mEq/L (ref 19–32)
GFR: 45.04 mL/min — ABNORMAL LOW (ref >60.00)
Glucose, Bld: 122 mg/dL — ABNORMAL HIGH (ref 70–99)
Potassium: 3.9 mEq/L (ref 3.5–5.1)
Sodium: 138 mEq/L (ref 135–145)
Total Bilirubin: 0.3 mg/dL (ref 0.3–1.2)
Total Protein: 6.9 g/dL (ref 6.0–8.3)

## 2010-11-05 ENCOUNTER — Encounter: Payer: Self-pay | Admitting: Internal Medicine

## 2010-11-05 ENCOUNTER — Ambulatory Visit (INDEPENDENT_AMBULATORY_CARE_PROVIDER_SITE_OTHER): Payer: Medicare Other | Admitting: Internal Medicine

## 2010-11-05 DIAGNOSIS — N189 Chronic kidney disease, unspecified: Secondary | ICD-10-CM

## 2010-11-05 DIAGNOSIS — R7309 Other abnormal glucose: Secondary | ICD-10-CM

## 2010-11-05 DIAGNOSIS — E785 Hyperlipidemia, unspecified: Secondary | ICD-10-CM

## 2010-11-05 DIAGNOSIS — R197 Diarrhea, unspecified: Secondary | ICD-10-CM

## 2010-11-05 DIAGNOSIS — I10 Essential (primary) hypertension: Secondary | ICD-10-CM

## 2010-11-05 DIAGNOSIS — E291 Testicular hypofunction: Secondary | ICD-10-CM

## 2010-11-05 MED ORDER — MUPIROCIN 2 % EX OINT: TOPICAL_OINTMENT | Freq: Two times a day (BID) | CUTANEOUS | Status: DC | PRN

## 2010-11-05 MED ORDER — VERAPAMIL HCL 240 MG (CO) PO TB24: 240.0000 mg | ORAL_TABLET | Freq: Every day | ORAL | Status: DC

## 2010-11-05 MED ORDER — LISINOPRIL 40 MG PO TABS: 40.0000 mg | ORAL_TABLET | Freq: Every day | ORAL | Status: DC

## 2010-11-05 MED ORDER — COLESEVELAM HCL 625 MG PO TABS: 1250.0000 mg | ORAL_TABLET | Freq: Two times a day (BID) | ORAL | Status: DC

## 2010-11-05 MED ORDER — TRIAMCINOLONE ACETONIDE 0.1 % EX OINT: TOPICAL_OINTMENT | Freq: Two times a day (BID) | CUTANEOUS | Status: DC

## 2010-11-05 MED ORDER — ACYCLOVIR 400 MG PO TABS: 400.0000 mg | ORAL_TABLET | Freq: Two times a day (BID) | ORAL | Status: DC

## 2010-11-05 MED ORDER — CHLORTHALIDONE 50 MG PO TABS: 50.0000 mg | ORAL_TABLET | Freq: Every day | ORAL | Status: DC

## 2010-11-05 NOTE — Assessment & Plan Note (Signed)
Continue with current prescription therapy as reflected on the Med list.  

## 2010-11-05 NOTE — Assessment & Plan Note (Signed)
Controlled w/Welchol

## 2010-11-05 NOTE — Assessment & Plan Note (Signed)
Stable. Increase water intake

## 2010-11-05 NOTE — Assessment & Plan Note (Signed)
No desire to be treated yet

## 2010-11-05 NOTE — Assessment & Plan Note (Signed)
Monitoring

## 2010-11-05 NOTE — Progress Notes (Signed)
  Subjective:    Patient ID: Marvin Morrison, male    DOB: 1940/03/08, 70 y.o.   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  HENT: Negative for nosebleeds, congestion, sore throat, sneezing, trouble swallowing and neck pain.   Eyes: Negative for itching and visual disturbance.  Respiratory: Negative for cough.   Cardiovascular: Negative for chest pain, palpitations and leg swelling.  Gastrointestinal: Positive for diarrhea. Negative for nausea, blood in stool and abdominal distention.  Genitourinary: Negative for frequency and hematuria.  Musculoskeletal: Negative for back pain, joint swelling and gait problem.  Skin: Negative for rash.  Neurological: Negative for dizziness, tremors, speech difficulty and weakness.  Psychiatric/Behavioral: Negative for sleep disturbance, dysphoric mood and agitation. The patient is not nervous/anxious.        Objective:   Physical Exam  Constitutional: He is oriented to person, place, and time. He appears well-developed.  HENT:  Mouth/Throat: Oropharynx is clear and moist.  Eyes: Conjunctivae are normal. Pupils are equal, round, and reactive to light.  Neck: Normal range of motion. No JVD present. No thyromegaly present.  Cardiovascular: Normal rate, regular rhythm, normal heart sounds and intact distal pulses.  Exam reveals no gallop and no friction rub.   No murmur heard. Pulmonary/Chest: Effort normal and breath sounds normal. No respiratory distress. He has no wheezes. He has no rales. He exhibits no tenderness.  Abdominal: Soft. Bowel sounds are normal. He exhibits no distension and no mass. There  is no tenderness. There is no rebound and no guarding.  Musculoskeletal: Normal range of motion. He exhibits no edema and no tenderness.  Lymphadenopathy:    He has no cervical adenopathy.  Neurological: He is alert and oriented to person, place, and time. He has normal reflexes. No cranial nerve deficit. He exhibits normal muscle tone. Coordination normal.  Skin: Skin is warm and dry. Rash (mild) noted.  Psychiatric: He has a normal mood and affect. His behavior is normal. Judgment and thought content normal.    Lab Results  Component Value Date   WBC 6.7 11/01/2010   HGB 11.2* 11/01/2010   HCT 32.3* 11/01/2010   PLT 188.0 11/01/2010   GLUCOSE 122* 11/01/2010   CHOL 204* 04/02/2010   TRIG 258.0* 04/02/2010   HDL 34.70* 04/02/2010   LDLDIRECT 111.3 04/02/2010   ALT 20 11/01/2010   AST 17 11/01/2010   NA 138 11/01/2010   K 3.9 11/01/2010   CL 107 11/01/2010   CREATININE 1.6* 11/01/2010   BUN 32* 11/01/2010   CO2 22 11/01/2010   TSH 1.07 04/02/2010   PSA 0.41 04/02/2010   HGBA1C 5.6 11/28/2008          Assessment & Plan:

## 2010-11-16 ENCOUNTER — Other Ambulatory Visit: Payer: Self-pay | Admitting: Internal Medicine

## 2011-04-18 ENCOUNTER — Other Ambulatory Visit: Payer: Self-pay | Admitting: Internal Medicine

## 2011-05-06 ENCOUNTER — Ambulatory Visit: Payer: Medicare Other | Admitting: Internal Medicine

## 2011-05-13 ENCOUNTER — Other Ambulatory Visit (INDEPENDENT_AMBULATORY_CARE_PROVIDER_SITE_OTHER): Payer: Medicare Other

## 2011-05-13 ENCOUNTER — Encounter: Payer: Self-pay | Admitting: Internal Medicine

## 2011-05-13 ENCOUNTER — Ambulatory Visit (INDEPENDENT_AMBULATORY_CARE_PROVIDER_SITE_OTHER): Payer: Medicare Other | Admitting: Internal Medicine

## 2011-05-13 VITALS — BP 130/80 | HR 84 | Temp 97.4°F | Resp 16 | Wt 168.0 lb

## 2011-05-13 DIAGNOSIS — N189 Chronic kidney disease, unspecified: Secondary | ICD-10-CM | POA: Diagnosis not present

## 2011-05-13 DIAGNOSIS — R5381 Other malaise: Secondary | ICD-10-CM

## 2011-05-13 DIAGNOSIS — R5383 Other fatigue: Secondary | ICD-10-CM

## 2011-05-13 DIAGNOSIS — I10 Essential (primary) hypertension: Secondary | ICD-10-CM

## 2011-05-13 DIAGNOSIS — Z0000 Encounter for general adult medical examination without abnormal findings: Secondary | ICD-10-CM | POA: Diagnosis not present

## 2011-05-13 DIAGNOSIS — R197 Diarrhea, unspecified: Secondary | ICD-10-CM | POA: Diagnosis not present

## 2011-05-13 DIAGNOSIS — E785 Hyperlipidemia, unspecified: Secondary | ICD-10-CM | POA: Diagnosis not present

## 2011-05-13 DIAGNOSIS — N320 Bladder-neck obstruction: Secondary | ICD-10-CM

## 2011-05-13 LAB — CBC WITH DIFFERENTIAL/PLATELET
Basophils Relative: 0.7 % (ref 0.0–3.0)
Eosinophils Absolute: 0.2 10*3/uL (ref 0.0–0.7)
Eosinophils Relative: 2.1 % (ref 0.0–5.0)
Hemoglobin: 10.7 g/dL — ABNORMAL LOW (ref 13.0–17.0)
Lymphocytes Relative: 20.3 % (ref 12.0–46.0)
MCHC: 33.3 g/dL (ref 30.0–36.0)
Monocytes Relative: 11.5 % (ref 3.0–12.0)
Neutro Abs: 5.7 10*3/uL (ref 1.4–7.7)
RBC: 3.26 Mil/uL — ABNORMAL LOW (ref 4.22–5.81)
WBC: 8.8 10*3/uL (ref 4.5–10.5)

## 2011-05-13 LAB — LIPID PANEL
HDL: 38.1 mg/dL — ABNORMAL LOW (ref >39.00)
Total CHOL/HDL Ratio: 5
Triglycerides: 208 mg/dL — ABNORMAL HIGH (ref 0.0–149.0)

## 2011-05-13 LAB — URINALYSIS
Ketones, ur: NEGATIVE
Specific Gravity, Urine: 1.025 (ref 1.000–1.030)
Urine Glucose: NEGATIVE
Urobilinogen, UA: 0.2 (ref 0.0–1.0)

## 2011-05-13 LAB — HEPATIC FUNCTION PANEL
ALT: 16 U/L (ref 0–53)
AST: 18 U/L (ref 0–37)
Albumin: 4.4 g/dL (ref 3.5–5.2)
Alkaline Phosphatase: 43 U/L (ref 39–117)
Total Protein: 6.9 g/dL (ref 6.0–8.3)

## 2011-05-13 LAB — BASIC METABOLIC PANEL
CO2: 20 mEq/L (ref 19–32)
Calcium: 9.3 mg/dL (ref 8.4–10.5)
Sodium: 139 mEq/L (ref 135–145)

## 2011-05-13 LAB — LDL CHOLESTEROL, DIRECT: Direct LDL: 114.2 mg/dL

## 2011-05-13 NOTE — Assessment & Plan Note (Signed)
Monitor labs 

## 2011-05-13 NOTE — Progress Notes (Signed)
Patient ID: Marvin Morrison, male   DOB: [birth year], 71 y.o.   MRN: [id number]  Subjective:    Patient ID: Marvin Morrison, male    DOB: 1940-01-17, 71 y.o.   MRN: [id number]  HPI The patient is here for a wellness exam. The patient has been doing well overall without major physical or psychological issues going on lately. 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  HENT: Negative for nosebleeds, congestion, sore throat, sneezing, trouble swallowing and neck pain.   Eyes: Negative for itching and visual disturbance.  Respiratory: Negative for cough.   Cardiovascular: Negative for chest pain, palpitations and leg swelling.  Gastrointestinal: Positive for diarrhea. Negative for nausea, blood in stool and abdominal distention.  Genitourinary: Negative for frequency and hematuria.  Musculoskeletal: Negative for back pain, joint swelling and gait problem.  Skin: Negative for rash.  Neurological: Negative for dizziness, tremors, speech difficulty and weakness.  Psychiatric/Behavioral: Negative for sleep disturbance, dysphoric mood and agitation. The patient is not nervous/anxious.        Objective:   Physical Exam  Constitutional: He is oriented to person, place, and time. He appears well-developed.  HENT:  Mouth/Throat: Oropharynx is clear and moist.  Eyes: Conjunctivae are normal. Pupils are equal, round, and reactive to light.  Neck: Normal range of motion. No JVD present. No thyromegaly present.  Cardiovascular: Normal rate, regular rhythm, normal heart sounds and intact  distal pulses.  Exam reveals no gallop and no friction rub.   No murmur heard. Pulmonary/Chest: Effort normal and breath sounds normal. No respiratory distress. He has no wheezes. He has no rales. He exhibits no tenderness.  Abdominal: Soft. Bowel sounds are normal. He exhibits no distension and no mass. There is no tenderness. There is no rebound and no guarding.  Musculoskeletal: Normal range of motion. He exhibits no edema and no tenderness.  Lymphadenopathy:    He has no cervical adenopathy.  Neurological: He is alert and oriented to person, place, and time. He has normal reflexes. No cranial nerve deficit. He exhibits normal muscle tone. Coordination normal.  Skin: Skin is warm and dry. Rash (mild) noted.  Psychiatric: He has a normal mood and affect. His behavior is normal. Judgment and thought content normal.    Lab Results  Component Value Date   WBC 6.7 11/01/2010   HGB 11.2* 11/01/2010   HCT 32.3* 11/01/2010   PLT 188.0 11/01/2010   GLUCOSE 122* 11/01/2010   CHOL 204* 04/02/2010   TRIG 258.0* 04/02/2010   HDL 34.70* 04/02/2010   LDLDIRECT 111.3 04/02/2010   ALT 20 11/01/2010   AST 17 11/01/2010   NA 138 11/01/2010   K 3.9 11/01/2010   CL 107 11/01/2010   CREATININE 1.6* 11/01/2010   BUN 32* 11/01/2010   CO2 22 11/01/2010   TSH 1.07 04/02/2010   PSA 0.41 04/02/2010   HGBA1C 5.6 11/28/2008          Assessment & Plan:

## 2011-05-13 NOTE — Assessment & Plan Note (Signed)

## 2011-05-13 NOTE — Assessment & Plan Note (Signed)
Continue with current prescription therapy as reflected on the Med list.  

## 2011-05-13 NOTE — Assessment & Plan Note (Signed)
Controlled Continue with current prescription therapy as reflected on the Med list.  

## 2011-05-13 NOTE — Assessment & Plan Note (Signed)
Monitoring labs 

## 2011-05-13 NOTE — Assessment & Plan Note (Signed)
Better  

## 2011-05-17 ENCOUNTER — Other Ambulatory Visit: Payer: Self-pay | Admitting: Internal Medicine

## 2011-06-13 ENCOUNTER — Other Ambulatory Visit: Payer: Self-pay | Admitting: Internal Medicine

## 2011-10-14 ENCOUNTER — Encounter: Payer: Self-pay | Admitting: Internal Medicine

## 2011-10-14 ENCOUNTER — Ambulatory Visit (INDEPENDENT_AMBULATORY_CARE_PROVIDER_SITE_OTHER): Payer: Medicare Other | Admitting: Internal Medicine

## 2011-10-14 VITALS — BP 128/72 | HR 80 | Temp 98.1°F | Resp 16 | Wt 169.0 lb

## 2011-10-14 DIAGNOSIS — R7309 Other abnormal glucose: Secondary | ICD-10-CM

## 2011-10-14 DIAGNOSIS — I10 Essential (primary) hypertension: Secondary | ICD-10-CM | POA: Diagnosis not present

## 2011-10-14 DIAGNOSIS — E291 Testicular hypofunction: Secondary | ICD-10-CM | POA: Diagnosis not present

## 2011-10-14 DIAGNOSIS — E785 Hyperlipidemia, unspecified: Secondary | ICD-10-CM | POA: Diagnosis not present

## 2011-10-14 DIAGNOSIS — R197 Diarrhea, unspecified: Secondary | ICD-10-CM | POA: Diagnosis not present

## 2011-10-14 DIAGNOSIS — N320 Bladder-neck obstruction: Secondary | ICD-10-CM

## 2011-10-14 MED ORDER — ACYCLOVIR 400 MG PO TABS: 400.0000 mg | ORAL_TABLET | Freq: Two times a day (BID) | ORAL | Status: DC

## 2011-10-14 MED ORDER — CHLORTHALIDONE 50 MG PO TABS: 50.0000 mg | ORAL_TABLET | Freq: Every day | ORAL | Status: DC

## 2011-10-14 MED ORDER — VERAPAMIL HCL ER 240 MG PO TBCR: 240.0000 mg | EXTENDED_RELEASE_TABLET | Freq: Every day | ORAL | Status: DC

## 2011-10-14 MED ORDER — COLESEVELAM HCL 625 MG PO TABS: 1250.0000 mg | ORAL_TABLET | Freq: Two times a day (BID) | ORAL | Status: DC

## 2011-10-14 MED ORDER — LISINOPRIL 40 MG PO TABS: 40.0000 mg | ORAL_TABLET | Freq: Every day | ORAL | Status: DC

## 2011-10-14 MED ORDER — MUPIROCIN 2 % EX OINT: TOPICAL_OINTMENT | Freq: Two times a day (BID) | CUTANEOUS | Status: DC | PRN

## 2011-10-14 MED ORDER — TRIAMCINOLONE ACETONIDE 0.1 % EX OINT: TOPICAL_OINTMENT | Freq: Two times a day (BID) | CUTANEOUS | Status: DC

## 2011-10-14 NOTE — Progress Notes (Signed)
   Subjective:    Patient ID: Marvin Morrison, male    DOB: 1940-07-02, 71 y.o.   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  HENT: Negative for nosebleeds, congestion, sore throat, sneezing, trouble swallowing and neck pain.   Eyes: Negative for itching and visual disturbance.  Respiratory: Negative for cough.   Cardiovascular: Negative for chest pain, palpitations and leg swelling.  Gastrointestinal: Positive for diarrhea. Negative for nausea, blood in stool and abdominal distention.  Genitourinary: Negative for frequency and hematuria.  Musculoskeletal: Negative for back pain, joint swelling and gait problem.  Skin: Negative for rash.  Neurological: Negative for dizziness, tremors, speech difficulty and weakness.  Psychiatric/Behavioral: Negative for disturbed wake/sleep cycle, dysphoric mood and agitation. The patient is not nervous/anxious.        Objective:   Physical Exam  Constitutional: He is oriented to person, place, and time. He appears well-developed.  HENT:  Mouth/Throat: Oropharynx is clear and moist.  Eyes: Conjunctivae normal are normal. Pupils are equal, round, and reactive to light.  Neck: Normal range of motion. No JVD present. No thyromegaly present.  Cardiovascular: Normal rate, regular rhythm, normal heart sounds and intact distal pulses.  Exam reveals no gallop and no friction rub.   No murmur heard. Pulmonary/Chest: Effort normal and breath sounds normal. No respiratory distress. He has no wheezes. He has no rales. He  exhibits no tenderness.  Abdominal: Soft. Bowel sounds are normal. He exhibits no distension and no mass. There is no tenderness. There is no rebound and no guarding.  Musculoskeletal: Normal range of motion. He exhibits no edema and no tenderness.  Lymphadenopathy:    He has no cervical adenopathy.  Neurological: He is alert and oriented to person, place, and time. He has normal reflexes. No cranial nerve deficit. He exhibits normal muscle tone. Coordination normal.  Skin: Skin is warm and dry. Rash (mild) noted.  Psychiatric: He has a normal mood and affect. His behavior is normal. Judgment and thought content normal.    Lab Results  Component Value Date   WBC 8.8 05/13/2011   HGB 10.7* 05/13/2011   HCT 32.0* 05/13/2011   PLT 173.0 05/13/2011   GLUCOSE 116* 05/13/2011   CHOL 197 05/13/2011   TRIG 208.0* 05/13/2011   HDL 38.10* 05/13/2011   LDLDIRECT 114.2 05/13/2011   ALT 16 05/13/2011   AST 18 05/13/2011   NA 139 05/13/2011   K 4.6 05/13/2011   CL 110 05/13/2011   CREATININE 1.8* 05/13/2011   BUN 38* 05/13/2011   CO2 20 05/13/2011   TSH 1.17 05/13/2011   PSA 0.57 05/13/2011   HGBA1C 5.6 11/28/2008          Assessment & Plan:

## 2011-10-14 NOTE — Assessment & Plan Note (Signed)
  2012 He declined Rx

## 2011-10-14 NOTE — Assessment & Plan Note (Signed)
=-  On welchol

## 2011-10-14 NOTE — Assessment & Plan Note (Signed)
Continue with current prescription therapy as reflected on the Med list.  

## 2011-10-14 NOTE — Assessment & Plan Note (Signed)
On Welchol 

## 2011-10-15 ENCOUNTER — Other Ambulatory Visit: Payer: Self-pay | Admitting: Internal Medicine

## 2011-10-20 ENCOUNTER — Other Ambulatory Visit: Payer: Self-pay

## 2011-10-20 NOTE — Telephone Encounter (Signed)
Pt called requesting direction on Welchol be changed to 3 tablets BID so that he can get a more medications at a cheaper cost. Pharmacist and I advised pt that conflicting record on actual doing vs prescribed dosing is not a good medical practice as it can become confusing. Pt is still requesting MD change medication direction and call him directly.

## 2011-10-20 NOTE — Telephone Encounter (Signed)
Ok 3 tabs bid - changed Rx Thx

## 2011-10-25 MED ORDER — COLESEVELAM HCL 625 MG PO TABS: 1875.0000 mg | ORAL_TABLET | Freq: Two times a day (BID) | ORAL | Status: DC

## 2011-11-15 ENCOUNTER — Other Ambulatory Visit: Payer: Self-pay | Admitting: Internal Medicine

## 2012-01-16 ENCOUNTER — Other Ambulatory Visit: Payer: Self-pay | Admitting: Internal Medicine

## 2012-05-14 ENCOUNTER — Other Ambulatory Visit: Payer: Self-pay | Admitting: Internal Medicine

## 2012-05-15 ENCOUNTER — Other Ambulatory Visit: Payer: Self-pay | Admitting: Internal Medicine

## 2012-05-18 ENCOUNTER — Encounter: Payer: Self-pay | Admitting: Internal Medicine

## 2012-05-18 ENCOUNTER — Ambulatory Visit (INDEPENDENT_AMBULATORY_CARE_PROVIDER_SITE_OTHER): Payer: Medicare Other | Admitting: Internal Medicine

## 2012-05-18 VITALS — BP 120/80 | HR 76 | Temp 98.0°F | Resp 16 | Wt 164.0 lb

## 2012-05-18 DIAGNOSIS — E785 Hyperlipidemia, unspecified: Secondary | ICD-10-CM

## 2012-05-18 DIAGNOSIS — N320 Bladder-neck obstruction: Secondary | ICD-10-CM

## 2012-05-18 DIAGNOSIS — I10 Essential (primary) hypertension: Secondary | ICD-10-CM

## 2012-05-18 DIAGNOSIS — N189 Chronic kidney disease, unspecified: Secondary | ICD-10-CM

## 2012-05-18 DIAGNOSIS — R197 Diarrhea, unspecified: Secondary | ICD-10-CM

## 2012-05-18 DIAGNOSIS — I129 Hypertensive chronic kidney disease with stage 1 through stage 4 chronic kidney disease, or unspecified chronic kidney disease: Secondary | ICD-10-CM

## 2012-05-18 DIAGNOSIS — R7309 Other abnormal glucose: Secondary | ICD-10-CM

## 2012-05-18 DIAGNOSIS — Z0000 Encounter for general adult medical examination without abnormal findings: Secondary | ICD-10-CM | POA: Diagnosis not present

## 2012-05-18 DIAGNOSIS — E291 Testicular hypofunction: Secondary | ICD-10-CM

## 2012-05-18 DIAGNOSIS — R634 Abnormal weight loss: Secondary | ICD-10-CM

## 2012-05-18 MED ORDER — CHLORTHALIDONE 50 MG PO TABS: 25.0000 mg | ORAL_TABLET | Freq: Every day | ORAL | Status: DC

## 2012-05-18 MED ORDER — VERAPAMIL HCL ER 240 MG PO TBCR: 240.0000 mg | EXTENDED_RELEASE_TABLET | Freq: Every day | ORAL | Status: DC

## 2012-05-18 MED ORDER — LISINOPRIL 40 MG PO TABS: 40.0000 mg | ORAL_TABLET | Freq: Every day | ORAL | Status: DC

## 2012-05-18 MED ORDER — COLESEVELAM HCL 625 MG PO TABS: 1250.0000 mg | ORAL_TABLET | Freq: Two times a day (BID) | ORAL | Status: DC

## 2012-05-18 MED ORDER — MUPIROCIN 2 % EX OINT: TOPICAL_OINTMENT | Freq: Every day | CUTANEOUS | Status: DC

## 2012-05-18 MED ORDER — COLESEVELAM HCL 625 MG PO TABS: 1875.0000 mg | ORAL_TABLET | Freq: Two times a day (BID) | ORAL | Status: DC

## 2012-05-18 MED ORDER — ACYCLOVIR 400 MG PO TABS: 400.0000 mg | ORAL_TABLET | Freq: Two times a day (BID) | ORAL | Status: DC

## 2012-05-18 MED ORDER — TRIAMCINOLONE ACETONIDE 0.1 % EX OINT: TOPICAL_OINTMENT | Freq: Two times a day (BID) | CUTANEOUS | Status: DC

## 2012-05-18 NOTE — Progress Notes (Signed)
  Subjective:    HPI  The patient is here for a wellness exam. The patient has been doing well overall without major physical or psychological issues going on lately.  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  HENT: Negative for nosebleeds, congestion, sore throat, sneezing, trouble swallowing and neck pain.   Eyes: Negative for itching and visual disturbance.  Respiratory: Negative for cough.   Cardiovascular: Negative for chest pain, palpitations and leg swelling.  Gastrointestinal: Positive for diarrhea. Negative for nausea, blood in stool and abdominal distention.  Genitourinary: Negative for frequency and hematuria.  Musculoskeletal: Negative for back pain, joint swelling and gait problem.  Skin: Negative for rash.  Neurological: Negative for dizziness, tremors, speech difficulty and weakness.  Psychiatric/Behavioral: Negative for suicidal ideas, sleep disturbance, dysphoric mood and agitation. The patient is not nervous/anxious.        Objective:   Physical Exam  Constitutional: He is oriented to person, place, and time. He appears well-developed.  HENT:  Mouth/Throat: Oropharynx is clear and moist.  Eyes: Conjunctivae are normal. Pupils are equal, round, and reactive to light.  Neck: Normal range of motion. No JVD present. No thyromegaly present.  Cardiovascular: Normal rate, regular rhythm, normal heart sounds and intact distal pulses.  Exam reveals no gallop and no friction rub.   No murmur heard. Pulmonary/Chest: Effort normal and breath sounds  normal. No respiratory distress. He has no wheezes. He has no rales. He exhibits no tenderness.  Abdominal: Soft. Bowel sounds are normal. He exhibits no distension and no mass. There is no tenderness. There is no rebound and no guarding.  Genitourinary: Rectum normal, prostate normal and penis normal. Guaiac negative stool.  Musculoskeletal: Normal range of motion. He exhibits no edema and no tenderness.  Lymphadenopathy:    He has no cervical adenopathy.  Neurological: He is alert and oriented to person, place, and time. He has normal reflexes. No cranial nerve deficit. He exhibits normal muscle tone. Coordination normal.  Skin: Skin is warm and dry. Rash (mild) noted.  Psychiatric: He has a normal mood and affect. His behavior is normal. Judgment and thought content normal.    Lab Results  Component Value Date   WBC 8.8 05/13/2011   HGB 10.7* 05/13/2011   HCT 32.0* 05/13/2011   PLT 173.0 05/13/2011   GLUCOSE 116* 05/13/2011   CHOL 197 05/13/2011   TRIG 208.0* 05/13/2011   HDL 38.10* 05/13/2011   LDLDIRECT 114.2 05/13/2011   ALT 16 05/13/2011   AST 18 05/13/2011   NA 139 05/13/2011   K 4.6 05/13/2011   CL 110 05/13/2011   CREATININE 1.8* 05/13/2011   BUN 38* 05/13/2011   CO2 20 05/13/2011   TSH 1.17 05/13/2011   PSA 0.57 05/13/2011   HGBA1C 5.6 11/28/2008          Assessment & Plan:

## 2012-05-18 NOTE — Assessment & Plan Note (Signed)
labs

## 2012-05-18 NOTE — Assessment & Plan Note (Addendum)

## 2012-05-18 NOTE — Assessment & Plan Note (Signed)
Labs

## 2012-05-18 NOTE — Assessment & Plan Note (Signed)
Continue with current prescription therapy as reflected on the Med list.  

## 2012-05-21 ENCOUNTER — Other Ambulatory Visit (INDEPENDENT_AMBULATORY_CARE_PROVIDER_SITE_OTHER): Payer: Medicare Other

## 2012-05-21 DIAGNOSIS — I10 Essential (primary) hypertension: Secondary | ICD-10-CM

## 2012-05-21 DIAGNOSIS — E785 Hyperlipidemia, unspecified: Secondary | ICD-10-CM | POA: Diagnosis not present

## 2012-05-21 DIAGNOSIS — E291 Testicular hypofunction: Secondary | ICD-10-CM

## 2012-05-21 DIAGNOSIS — N189 Chronic kidney disease, unspecified: Secondary | ICD-10-CM

## 2012-05-21 DIAGNOSIS — R197 Diarrhea, unspecified: Secondary | ICD-10-CM | POA: Diagnosis not present

## 2012-05-21 DIAGNOSIS — Z0000 Encounter for general adult medical examination without abnormal findings: Secondary | ICD-10-CM

## 2012-05-21 DIAGNOSIS — N320 Bladder-neck obstruction: Secondary | ICD-10-CM

## 2012-05-21 DIAGNOSIS — R634 Abnormal weight loss: Secondary | ICD-10-CM

## 2012-05-21 DIAGNOSIS — R7309 Other abnormal glucose: Secondary | ICD-10-CM | POA: Diagnosis not present

## 2012-05-21 LAB — CBC WITH DIFFERENTIAL/PLATELET
Basophils Absolute: 0.1 10*3/uL (ref 0.0–0.1)
Basophils Relative: 0.8 % (ref 0.0–3.0)
Eosinophils Absolute: 0.3 10*3/uL (ref 0.0–0.7)
HCT: 31.1 % — ABNORMAL LOW (ref 39.0–52.0)
Hemoglobin: 10.7 g/dL — ABNORMAL LOW (ref 13.0–17.0)
Lymphs Abs: 1.7 10*3/uL (ref 0.7–4.0)
MCHC: 34.5 g/dL (ref 30.0–36.0)
MCV: 95.9 fl (ref 78.0–100.0)
Monocytes Absolute: 0.7 10*3/uL (ref 0.1–1.0)
Neutro Abs: 4.3 10*3/uL (ref 1.4–7.7)
RDW: 13.2 % (ref 11.5–14.6)

## 2012-05-21 LAB — URINALYSIS
Hgb urine dipstick: NEGATIVE
Nitrite: NEGATIVE
Total Protein, Urine: NEGATIVE
Urine Glucose: NEGATIVE
pH: 6 (ref 5.0–8.0)

## 2012-05-21 LAB — LDL CHOLESTEROL, DIRECT: Direct LDL: 117.7 mg/dL

## 2012-05-21 LAB — HEPATIC FUNCTION PANEL
AST: 17 U/L (ref 0–37)
Albumin: 4.3 g/dL (ref 3.5–5.2)
Alkaline Phosphatase: 41 U/L (ref 39–117)
Bilirubin, Direct: 0 mg/dL (ref 0.0–0.3)
Total Protein: 6.8 g/dL (ref 6.0–8.3)

## 2012-05-21 LAB — LIPID PANEL: Cholesterol: 207 mg/dL — ABNORMAL HIGH (ref 0–200)

## 2012-05-21 LAB — BASIC METABOLIC PANEL
BUN: 45 mg/dL — ABNORMAL HIGH (ref 6–23)
Calcium: 9.3 mg/dL (ref 8.4–10.5)
Creatinine, Ser: 1.9 mg/dL — ABNORMAL HIGH (ref 0.4–1.5)

## 2012-05-21 LAB — HEMOGLOBIN A1C: Hgb A1c MFr Bld: 5.6 % (ref 4.6–6.5)

## 2012-10-19 ENCOUNTER — Other Ambulatory Visit: Payer: Self-pay | Admitting: Internal Medicine

## 2012-12-13 ENCOUNTER — Ambulatory Visit (INDEPENDENT_AMBULATORY_CARE_PROVIDER_SITE_OTHER): Payer: Medicare Other | Admitting: Internal Medicine

## 2012-12-13 ENCOUNTER — Encounter: Payer: Self-pay | Admitting: Internal Medicine

## 2012-12-13 ENCOUNTER — Other Ambulatory Visit (INDEPENDENT_AMBULATORY_CARE_PROVIDER_SITE_OTHER): Payer: Medicare Other

## 2012-12-13 VITALS — BP 130/78 | HR 72 | Temp 96.0°F | Resp 16 | Wt 164.8 lb

## 2012-12-13 DIAGNOSIS — R634 Abnormal weight loss: Secondary | ICD-10-CM | POA: Diagnosis not present

## 2012-12-13 DIAGNOSIS — B029 Zoster without complications: Secondary | ICD-10-CM

## 2012-12-13 DIAGNOSIS — E785 Hyperlipidemia, unspecified: Secondary | ICD-10-CM

## 2012-12-13 DIAGNOSIS — N189 Chronic kidney disease, unspecified: Secondary | ICD-10-CM

## 2012-12-13 DIAGNOSIS — I10 Essential (primary) hypertension: Secondary | ICD-10-CM | POA: Diagnosis not present

## 2012-12-13 DIAGNOSIS — R7309 Other abnormal glucose: Secondary | ICD-10-CM | POA: Diagnosis not present

## 2012-12-13 LAB — CBC WITH DIFFERENTIAL/PLATELET
Basophils Relative: 0.5 % (ref 0.0–3.0)
Eosinophils Relative: 1.7 % (ref 0.0–5.0)
HCT: 32.6 % — ABNORMAL LOW (ref 39.0–52.0)
Hemoglobin: 11.1 g/dL — ABNORMAL LOW (ref 13.0–17.0)
Lymphocytes Relative: 16.9 % (ref 12.0–46.0)
MCHC: 34 g/dL (ref 30.0–36.0)
Monocytes Absolute: 0.8 10*3/uL (ref 0.1–1.0)
Monocytes Relative: 9.5 % (ref 3.0–12.0)
Neutro Abs: 6.1 10*3/uL (ref 1.4–7.7)
Neutrophils Relative %: 71.4 % (ref 43.0–77.0)
Platelets: 177 10*3/uL (ref 150.0–400.0)

## 2012-12-13 LAB — BASIC METABOLIC PANEL
BUN: 44 mg/dL — ABNORMAL HIGH (ref 6–23)
CO2: 19 mEq/L (ref 19–32)
Chloride: 108 mEq/L (ref 96–112)
GFR: 40.14 mL/min — ABNORMAL LOW (ref >60.00)
Potassium: 4.5 mEq/L (ref 3.5–5.1)

## 2012-12-13 LAB — HEPATIC FUNCTION PANEL
ALT: 16 U/L (ref 0–53)
AST: 15 U/L (ref 0–37)
Alkaline Phosphatase: 60 U/L (ref 39–117)
Bilirubin, Direct: 0 mg/dL (ref 0.0–0.3)
Total Bilirubin: 0.5 mg/dL (ref 0.3–1.2)
Total Protein: 7.3 g/dL (ref 6.0–8.3)

## 2012-12-13 LAB — HEMOGLOBIN A1C: Hgb A1c MFr Bld: 5.6 % (ref 4.6–6.5)

## 2012-12-13 MED ORDER — COLESEVELAM HCL 625 MG PO TABS: 1875.0000 mg | ORAL_TABLET | Freq: Two times a day (BID) | ORAL | Status: DC

## 2012-12-13 MED ORDER — VERAPAMIL HCL ER 240 MG PO TBCR: 240.0000 mg | EXTENDED_RELEASE_TABLET | Freq: Every day | ORAL | Status: DC

## 2012-12-13 MED ORDER — LISINOPRIL 40 MG PO TABS: 40.0000 mg | ORAL_TABLET | Freq: Every day | ORAL | Status: DC

## 2012-12-13 MED ORDER — CHLORTHALIDONE 50 MG PO TABS: 50.0000 mg | ORAL_TABLET | Freq: Two times a day (BID) | ORAL | Status: DC

## 2012-12-13 MED ORDER — ACYCLOVIR 400 MG PO TABS: 400.0000 mg | ORAL_TABLET | Freq: Two times a day (BID) | ORAL | Status: DC

## 2012-12-13 NOTE — Progress Notes (Signed)
Pre visit review using our clinic review tool, if applicable. No additional management support is needed unless otherwise documented below in the visit note. 

## 2012-12-13 NOTE — Assessment & Plan Note (Signed)
Continue with current prescription therapy as reflected on the Med list.  

## 2012-12-13 NOTE — Assessment & Plan Note (Signed)
Labs

## 2012-12-13 NOTE — Assessment & Plan Note (Signed)
No relapse 

## 2012-12-13 NOTE — Progress Notes (Signed)
   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  HENT: Negative for congestion, nosebleeds, sneezing, sore throat and trouble swallowing.   Eyes: Negative for itching and visual disturbance.  Respiratory: Negative for cough.   Cardiovascular: Negative for chest pain, palpitations and leg swelling.  Gastrointestinal: Positive for diarrhea. Negative for nausea, blood in stool and abdominal distention.  Genitourinary: Negative for frequency and hematuria.  Musculoskeletal: Negative for back pain, gait problem, joint swelling and neck pain.  Skin: Negative for rash.  Neurological: Negative for dizziness, tremors, speech difficulty and weakness.  Psychiatric/Behavioral: Negative for sleep disturbance, dysphoric mood and agitation. The patient is not nervous/anxious.        Objective:   Physical Exam  Constitutional: He is oriented to person, place, and time. He appears well-developed.  HENT:  Mouth/Throat: Oropharynx is clear and moist.  Eyes: Conjunctivae are normal. Pupils are equal, round, and reactive to light.  Neck: Normal range of motion. No JVD present. No thyromegaly present.  Cardiovascular: Normal rate, regular rhythm, normal heart sounds and intact distal pulses.  Exam reveals no gallop and no friction rub.   No murmur heard. Pulmonary/Chest: Effort normal and breath sounds normal. No respiratory distress. He has no wheezes. He has no rales. He exhibits no tenderness.  Abdominal: Soft. Bowel sounds are normal. He exhibits no  distension and no mass. There is no tenderness. There is no rebound and no guarding.  Musculoskeletal: Normal range of motion. He exhibits no edema and no tenderness.  Lymphadenopathy:    He has no cervical adenopathy.  Neurological: He is alert and oriented to person, place, and time. He has normal reflexes. No cranial nerve deficit. He exhibits normal muscle tone. Coordination normal.  Skin: Skin is warm and dry. Rash (mild) noted.  Psychiatric: He has a normal mood and affect. His behavior is normal. Judgment and thought content normal.    Lab Results  Component Value Date   WBC 7.0 05/21/2012   HGB 10.7* 05/21/2012   HCT 31.1* 05/21/2012   PLT 158.0 05/21/2012   GLUCOSE 104* 05/21/2012   CHOL 207* 05/21/2012   TRIG 186.0* 05/21/2012   HDL 35.60* 05/21/2012   LDLDIRECT 117.7 05/21/2012   ALT 18 05/21/2012   AST 17 05/21/2012   NA 138 05/21/2012   K 4.9 05/21/2012   CL 112 05/21/2012   CREATININE 1.9* 05/21/2012   BUN 45* 05/21/2012   CO2 18* 05/21/2012   TSH 1.17 05/21/2012   PSA 0.48 05/21/2012   HGBA1C 5.6 05/21/2012          Assessment & Plan:

## 2012-12-13 NOTE — Assessment & Plan Note (Signed)
Wt Readings from Last 3 Encounters:  12/13/12 164 lb 12 oz (74.73 kg)  05/18/12 164 lb (74.39 kg)  10/14/11 169 lb (76.658 kg)

## 2013-06-13 ENCOUNTER — Ambulatory Visit (INDEPENDENT_AMBULATORY_CARE_PROVIDER_SITE_OTHER): Payer: Medicare Other | Admitting: Internal Medicine

## 2013-06-13 ENCOUNTER — Encounter: Payer: Self-pay | Admitting: Internal Medicine

## 2013-06-13 ENCOUNTER — Other Ambulatory Visit (INDEPENDENT_AMBULATORY_CARE_PROVIDER_SITE_OTHER): Payer: Medicare Other

## 2013-06-13 VITALS — BP 140/86 | HR 103 | Temp 98.3°F | Resp 16 | Ht 72.0 in | Wt 171.0 lb

## 2013-06-13 DIAGNOSIS — Z0000 Encounter for general adult medical examination without abnormal findings: Secondary | ICD-10-CM

## 2013-06-13 DIAGNOSIS — D509 Iron deficiency anemia, unspecified: Secondary | ICD-10-CM

## 2013-06-13 DIAGNOSIS — I10 Essential (primary) hypertension: Secondary | ICD-10-CM | POA: Diagnosis not present

## 2013-06-13 DIAGNOSIS — N320 Bladder-neck obstruction: Secondary | ICD-10-CM

## 2013-06-13 DIAGNOSIS — R7309 Other abnormal glucose: Secondary | ICD-10-CM

## 2013-06-13 DIAGNOSIS — E785 Hyperlipidemia, unspecified: Secondary | ICD-10-CM

## 2013-06-13 DIAGNOSIS — R634 Abnormal weight loss: Secondary | ICD-10-CM

## 2013-06-13 LAB — HEPATIC FUNCTION PANEL
ALBUMIN: 4.2 g/dL (ref 3.5–5.2)
ALT: 21 U/L (ref 0–53)
AST: 20 U/L (ref 0–37)
Alkaline Phosphatase: 50 U/L (ref 39–117)
Bilirubin, Direct: 0.1 mg/dL (ref 0.0–0.3)
TOTAL PROTEIN: 6.6 g/dL (ref 6.0–8.3)
Total Bilirubin: 0.3 mg/dL (ref 0.2–1.2)

## 2013-06-13 LAB — BASIC METABOLIC PANEL
BUN: 50 mg/dL — ABNORMAL HIGH (ref 6–23)
CALCIUM: 9.6 mg/dL (ref 8.4–10.5)
CO2: 21 mEq/L (ref 19–32)
CREATININE: 1.9 mg/dL — AB (ref 0.4–1.5)
Chloride: 110 mEq/L (ref 96–112)
GFR: 37.62 mL/min — AB (ref >60.00)
Glucose, Bld: 120 mg/dL — ABNORMAL HIGH (ref 70–99)
Potassium: 5.1 mEq/L (ref 3.5–5.1)
Sodium: 138 mEq/L (ref 135–145)

## 2013-06-13 LAB — LIPID PANEL
CHOL/HDL RATIO: 5
Cholesterol: 199 mg/dL (ref 0–200)
HDL: 36.9 mg/dL — AB (ref >39.00)
LDL CALC: 102 mg/dL — AB (ref 0–99)
NONHDL: 162.1
Triglycerides: 302 mg/dL — ABNORMAL HIGH (ref 0.0–149.0)
VLDL: 60.4 mg/dL — ABNORMAL HIGH (ref 0.0–40.0)

## 2013-06-13 LAB — URINALYSIS
BILIRUBIN URINE: NEGATIVE
Hgb urine dipstick: NEGATIVE
KETONES UR: NEGATIVE
Leukocytes, UA: NEGATIVE
Nitrite: NEGATIVE
Specific Gravity, Urine: 1.02 (ref 1.000–1.030)
Total Protein, Urine: NEGATIVE
URINE GLUCOSE: NEGATIVE
UROBILINOGEN UA: 0.2 (ref 0.0–1.0)
pH: 5.5 (ref 5.0–8.0)

## 2013-06-13 LAB — CBC WITH DIFFERENTIAL/PLATELET
BASOS ABS: 0 10*3/uL (ref 0.0–0.1)
Basophils Relative: 0.5 % (ref 0.0–3.0)
EOS ABS: 0.2 10*3/uL (ref 0.0–0.7)
Eosinophils Relative: 1.9 % (ref 0.0–5.0)
HCT: 31 % — ABNORMAL LOW (ref 39.0–52.0)
HEMOGLOBIN: 10.6 g/dL — AB (ref 13.0–17.0)
LYMPHS PCT: 16.5 % (ref 12.0–46.0)
Lymphs Abs: 1.4 10*3/uL (ref 0.7–4.0)
MCHC: 34.1 g/dL (ref 30.0–36.0)
MCV: 97 fl (ref 78.0–100.0)
Monocytes Absolute: 0.9 10*3/uL (ref 0.1–1.0)
Monocytes Relative: 11.2 % (ref 3.0–12.0)
NEUTROS PCT: 69.9 % (ref 43.0–77.0)
Neutro Abs: 5.8 10*3/uL (ref 1.4–7.7)
Platelets: 204 10*3/uL (ref 150.0–400.0)
RBC: 3.19 Mil/uL — ABNORMAL LOW (ref 4.22–5.81)
RDW: 14 % (ref 11.5–15.5)
WBC: 8.3 10*3/uL (ref 4.0–10.5)

## 2013-06-13 LAB — HEMOGLOBIN A1C: HEMOGLOBIN A1C: 5.7 % (ref 4.6–6.5)

## 2013-06-13 LAB — IBC PANEL
Iron: 112 ug/dL (ref 42–165)
SATURATION RATIOS: 38.3 % (ref 20.0–50.0)
Transferrin: 208.8 mg/dL — ABNORMAL LOW (ref 212.0–360.0)

## 2013-06-13 LAB — PSA: PSA: 0.66 ng/mL (ref 0.10–4.00)

## 2013-06-13 LAB — TSH: TSH: 0.92 u[IU]/mL (ref 0.35–4.50)

## 2013-06-13 MED ORDER — TRIAMCINOLONE ACETONIDE 0.1 % EX OINT: TOPICAL_OINTMENT | Freq: Two times a day (BID) | CUTANEOUS | Status: DC

## 2013-06-13 MED ORDER — LISINOPRIL 40 MG PO TABS: 40.0000 mg | ORAL_TABLET | Freq: Every day | ORAL | Status: DC

## 2013-06-13 MED ORDER — CHLORTHALIDONE 50 MG PO TABS: 50.0000 mg | ORAL_TABLET | Freq: Two times a day (BID) | ORAL | Status: DC

## 2013-06-13 MED ORDER — COLESEVELAM HCL 625 MG PO TABS: 1875.0000 mg | ORAL_TABLET | Freq: Two times a day (BID) | ORAL | Status: DC

## 2013-06-13 MED ORDER — MUPIROCIN 2 % EX OINT: TOPICAL_OINTMENT | Freq: Every day | CUTANEOUS | Status: DC

## 2013-06-13 MED ORDER — ACYCLOVIR 400 MG PO TABS: 400.0000 mg | ORAL_TABLET | Freq: Two times a day (BID) | ORAL | Status: DC

## 2013-06-13 MED ORDER — VERAPAMIL HCL ER 240 MG PO TBCR: 240.0000 mg | EXTENDED_RELEASE_TABLET | Freq: Every day | ORAL | Status: DC

## 2013-06-13 NOTE — Assessment & Plan Note (Signed)
Continue with current prescription therapy as reflected on the Med list.  

## 2013-06-13 NOTE — Progress Notes (Signed)
Subjective:    HPI  The patient is here for a wellness exam. The patient has been doing well overall without major physical or psychological issues going on lately.  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  HENT: Negative for congestion, nosebleeds, sneezing, sore throat and trouble swallowing.   Eyes: Negative for itching and visual disturbance.  Respiratory: Negative for cough.   Cardiovascular: Negative for chest pain, palpitations and leg swelling.  Gastrointestinal: Positive for diarrhea. Negative for nausea, blood in stool and abdominal distention.  Genitourinary: Negative for frequency and hematuria.  Musculoskeletal: Negative for back pain, gait problem, joint swelling and neck pain.  Skin: Negative for rash.  Neurological: Negative for dizziness, tremors, speech difficulty and weakness.  Psychiatric/Behavioral: Negative for sleep disturbance, dysphoric mood and agitation. The patient is not nervous/anxious.        Objective:   Physical Exam  Constitutional: He is oriented to person, place, and time. He appears well-developed.  HENT:  Mouth/Throat: Oropharynx is clear and moist.  Eyes: Conjunctivae are normal. Pupils are equal, round, and reactive to light.  Neck: Normal range of motion. No JVD present. No thyromegaly present.  Cardiovascular: Normal rate, regular rhythm, normal heart sounds and intact distal pulses.  Exam reveals no gallop and no friction rub.   No murmur heard. Pulmonary/Chest: Effort normal and breath sounds normal. No  respiratory distress. He has no wheezes. He has no rales. He exhibits no tenderness.  Abdominal: Soft. Bowel sounds are normal. He exhibits no distension and no mass. There is no tenderness. There is no rebound and no guarding.  Musculoskeletal: Normal range of motion. He exhibits no edema and no tenderness.  Lymphadenopathy:    He has no cervical adenopathy.  Neurological: He is alert and oriented to person, place, and time. He has normal reflexes. No cranial nerve deficit. He exhibits normal muscle tone. Coordination normal.  Skin: Skin is warm and dry. Rash (mild) noted.  Psychiatric: He has a normal mood and affect. His behavior is normal. Judgment and thought content normal.    Subjective:    HPI  The patient is here for a wellness exam. The patient has been doing well overall without major physical or psychological issues going on lately.  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  HENT: Negative for nosebleeds, congestion, sore throat, sneezing, trouble swallowing and neck pain.   Eyes: Negative for itching and visual disturbance.  Respiratory: Negative for cough.   Cardiovascular: Negative for chest pain, palpitations and leg swelling.  Gastrointestinal: Positive for diarrhea. Negative for nausea, blood in stool and abdominal distention.  Genitourinary:  Negative for frequency and hematuria.  Musculoskeletal: Negative for back pain, joint swelling and gait problem.  Skin: Negative for rash.  Neurological: Negative for dizziness, tremors, speech  difficulty and weakness.  Psychiatric/Behavioral: Negative for suicidal ideas, sleep disturbance, dysphoric mood and agitation. The patient is not nervous/anxious.        Objective:   Physical Exam  Constitutional: He is oriented to person, place, and time. He appears well-developed.  HENT:  Mouth/Throat: Oropharynx is clear and moist.  Eyes: Conjunctivae are normal. Pupils are equal, round, and reactive to light.  Neck: Normal range of motion. No JVD present. No thyromegaly present.  Cardiovascular: Normal rate, regular rhythm, normal heart sounds and intact distal pulses.  Exam reveals no gallop and no friction rub.   No murmur heard. Pulmonary/Chest: Effort normal and breath sounds normal. No respiratory distress. He has no wheezes. He has no rales. He exhibits no tenderness.  Abdominal: Soft. Bowel sounds are normal. He exhibits no distension and no mass. There is no tenderness. There is no rebound and no guarding.  Genitourinary: Rectum normal, prostate normal and penis normal. Guaiac negative stool.  Musculoskeletal: Normal range of motion. He exhibits no edema and no tenderness.  Lymphadenopathy:    He has no cervical adenopathy.  Neurological: He is alert and oriented to person, place, and time. He has normal reflexes. No cranial nerve deficit. He exhibits normal muscle tone. Coordination normal.  Skin: Skin is warm and dry. Rash (mild) noted.  Psychiatric: He has a normal mood and affect. His behavior is normal. Judgment and thought content normal.    Lab Results  Component Value Date   WBC 8.6 12/13/2012   HGB 11.1* 12/13/2012   HCT 32.6* 12/13/2012   PLT 177.0 12/13/2012   GLUCOSE 117* 12/13/2012   CHOL 207* 05/21/2012   TRIG 186.0* 05/21/2012   HDL 35.60* 05/21/2012   LDLDIRECT 117.7 05/21/2012   ALT 16 12/13/2012   AST 15 12/13/2012   NA 137 12/13/2012   K 4.5 12/13/2012   CL 108 12/13/2012   CREATININE 1.8* 12/13/2012   BUN 44* 12/13/2012   CO2 19 12/13/2012    TSH 0.96 12/13/2012   PSA 0.48 05/21/2012   HGBA1C 5.6 12/13/2012          Assessment & Plan:    Lab Results  Component Value Date   WBC 8.6 12/13/2012   HGB 11.1* 12/13/2012   HCT 32.6* 12/13/2012   PLT 177.0 12/13/2012   GLUCOSE 117* 12/13/2012   CHOL 207* 05/21/2012   TRIG 186.0* 05/21/2012   HDL 35.60* 05/21/2012   LDLDIRECT 117.7 05/21/2012   ALT 16 12/13/2012   AST 15 12/13/2012   NA 137 12/13/2012   K 4.5 12/13/2012   CL 108 12/13/2012   CREATININE 1.8* 12/13/2012   BUN 44* 12/13/2012   CO2 19 12/13/2012   TSH 0.96 12/13/2012   PSA 0.48 05/21/2012   HGBA1C 5.6 12/13/2012          Assessment & Plan:

## 2013-06-13 NOTE — Assessment & Plan Note (Signed)
CBC

## 2013-06-13 NOTE — Assessment & Plan Note (Signed)

## 2013-06-13 NOTE — Assessment & Plan Note (Signed)
Resolved

## 2013-06-13 NOTE — Assessment & Plan Note (Signed)
Labs

## 2013-06-13 NOTE — Progress Notes (Signed)
Pre visit review using our clinic review tool, if applicable. No additional management support is needed unless otherwise documented below in the visit note. 

## 2013-06-14 ENCOUNTER — Telehealth: Payer: Self-pay | Admitting: Internal Medicine

## 2013-06-14 NOTE — Telephone Encounter (Signed)
Relevant patient education assigned to patient using Emmi. ° °

## 2013-08-13 ENCOUNTER — Other Ambulatory Visit: Payer: Self-pay | Admitting: Internal Medicine

## 2013-12-05 ENCOUNTER — Ambulatory Visit (INDEPENDENT_AMBULATORY_CARE_PROVIDER_SITE_OTHER): Payer: Medicare Other | Admitting: Internal Medicine

## 2013-12-05 ENCOUNTER — Encounter: Payer: Self-pay | Admitting: Internal Medicine

## 2013-12-05 VITALS — BP 138/74 | HR 87 | Temp 97.5°F | Wt 167.0 lb

## 2013-12-05 DIAGNOSIS — R634 Abnormal weight loss: Secondary | ICD-10-CM

## 2013-12-05 DIAGNOSIS — L298 Other pruritus: Secondary | ICD-10-CM

## 2013-12-05 DIAGNOSIS — E785 Hyperlipidemia, unspecified: Secondary | ICD-10-CM

## 2013-12-05 DIAGNOSIS — I10 Essential (primary) hypertension: Secondary | ICD-10-CM | POA: Diagnosis not present

## 2013-12-05 DIAGNOSIS — Z0000 Encounter for general adult medical examination without abnormal findings: Secondary | ICD-10-CM

## 2013-12-05 DIAGNOSIS — N320 Bladder-neck obstruction: Secondary | ICD-10-CM

## 2013-12-05 NOTE — Assessment & Plan Note (Signed)
Continue with current prescription therapy as reflected on the Med list.  

## 2013-12-05 NOTE — Progress Notes (Signed)
Pre visit review using our clinic review tool, if applicable. No additional management support is needed unless otherwise documented below in the visit note. 

## 2013-12-05 NOTE — Assessment & Plan Note (Signed)
Wt Readings from Last 3 Encounters:  12/05/13 167 lb (75.751 kg)  06/13/13 171 lb (77.565 kg)  12/13/12 164 lb 12 oz (74.73 kg)

## 2013-12-05 NOTE — Progress Notes (Signed)
   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  HENT: Negative for congestion, nosebleeds, sneezing, sore throat and trouble swallowing.   Eyes: Negative for itching and visual disturbance.  Respiratory: Negative for cough.   Cardiovascular: Negative for chest pain, palpitations and leg swelling.  Gastrointestinal: Positive for diarrhea. Negative for nausea, blood in stool and abdominal distention.  Genitourinary: Negative for frequency and hematuria.  Musculoskeletal: Negative for back pain, joint swelling, gait problem and neck pain.  Skin: Negative for rash.  Neurological: Negative for dizziness, tremors, speech difficulty and weakness.  Psychiatric/Behavioral: Negative for sleep disturbance, dysphoric mood and agitation. The patient is not nervous/anxious.        Objective:   Physical Exam  Constitutional: He is oriented to person, place, and time. He appears well-developed. No distress.  NAD  HENT:  Mouth/Throat: Oropharynx is clear and moist.  Eyes: Conjunctivae are normal. Pupils are equal, round, and reactive to light.  Neck: Normal range of motion. No JVD present. No thyromegaly present.  Cardiovascular: Normal rate, regular rhythm, normal heart sounds and intact distal pulses.  Exam reveals no gallop and no friction rub.   No murmur heard. Pulmonary/Chest: Effort normal and breath sounds normal. No respiratory distress. He has no wheezes. He has no rales. He exhibits no tenderness.  Abdominal: Soft. Bowel sounds are normal.  He exhibits no distension and no mass. There is no tenderness. There is no rebound and no guarding.  Musculoskeletal: Normal range of motion. He exhibits no edema or tenderness.  Lymphadenopathy:    He has no cervical adenopathy.  Neurological: He is alert and oriented to person, place, and time. He has normal reflexes. No cranial nerve deficit. He exhibits normal muscle tone. He displays a negative Romberg sign. Coordination and gait normal.  No meningeal signs  Skin: Skin is warm and dry. No rash noted.  Psychiatric: He has a normal mood and affect. His behavior is normal. Judgment and thought content normal.    Lab Results  Component Value Date   WBC 8.3 06/13/2013   HGB 10.6* 06/13/2013   HCT 31.0* 06/13/2013   PLT 204.0 06/13/2013   GLUCOSE 120* 06/13/2013   CHOL 199 06/13/2013   TRIG 302.0* 06/13/2013   HDL 36.90* 06/13/2013   LDLDIRECT 117.7 05/21/2012   LDLCALC 102* 06/13/2013   ALT 21 06/13/2013   AST 20 06/13/2013   NA 138 06/13/2013   K 5.1 06/13/2013   CL 110 06/13/2013   CREATININE 1.9* 06/13/2013   BUN 50* 06/13/2013   CO2 21 06/13/2013   TSH 0.92 06/13/2013   PSA 0.66 06/13/2013   HGBA1C 5.7 06/13/2013          Assessment & Plan:

## 2013-12-24 ENCOUNTER — Other Ambulatory Visit: Payer: Self-pay | Admitting: Internal Medicine

## 2013-12-24 ENCOUNTER — Ambulatory Visit (INDEPENDENT_AMBULATORY_CARE_PROVIDER_SITE_OTHER): Payer: Medicare Other | Admitting: Internal Medicine

## 2013-12-24 ENCOUNTER — Encounter: Payer: Self-pay | Admitting: Internal Medicine

## 2013-12-24 ENCOUNTER — Other Ambulatory Visit (INDEPENDENT_AMBULATORY_CARE_PROVIDER_SITE_OTHER): Payer: Medicare Other

## 2013-12-24 VITALS — BP 98/60 | HR 115 | Temp 97.8°F | Wt 160.0 lb

## 2013-12-24 DIAGNOSIS — I10 Essential (primary) hypertension: Secondary | ICD-10-CM

## 2013-12-24 DIAGNOSIS — D6489 Other specified anemias: Secondary | ICD-10-CM

## 2013-12-24 DIAGNOSIS — E785 Hyperlipidemia, unspecified: Secondary | ICD-10-CM

## 2013-12-24 DIAGNOSIS — R790 Abnormal level of blood mineral: Secondary | ICD-10-CM

## 2013-12-24 DIAGNOSIS — R634 Abnormal weight loss: Secondary | ICD-10-CM | POA: Diagnosis not present

## 2013-12-24 DIAGNOSIS — N183 Chronic kidney disease, stage 3 unspecified: Secondary | ICD-10-CM

## 2013-12-24 DIAGNOSIS — Z0000 Encounter for general adult medical examination without abnormal findings: Secondary | ICD-10-CM | POA: Diagnosis not present

## 2013-12-24 DIAGNOSIS — R531 Weakness: Secondary | ICD-10-CM | POA: Diagnosis not present

## 2013-12-24 DIAGNOSIS — N320 Bladder-neck obstruction: Secondary | ICD-10-CM

## 2013-12-24 DIAGNOSIS — L298 Other pruritus: Secondary | ICD-10-CM | POA: Diagnosis not present

## 2013-12-24 DIAGNOSIS — Z125 Encounter for screening for malignant neoplasm of prostate: Secondary | ICD-10-CM | POA: Diagnosis not present

## 2013-12-24 DIAGNOSIS — I129 Hypertensive chronic kidney disease with stage 1 through stage 4 chronic kidney disease, or unspecified chronic kidney disease: Secondary | ICD-10-CM

## 2013-12-24 LAB — CBC WITH DIFFERENTIAL/PLATELET
Basophils Absolute: 0 10*3/uL (ref 0.0–0.1)
Basophils Relative: 0.3 % (ref 0.0–3.0)
Eosinophils Absolute: 0.2 10*3/uL (ref 0.0–0.7)
Eosinophils Relative: 1.9 % (ref 0.0–5.0)
HCT: 27.9 % — ABNORMAL LOW (ref 39.0–52.0)
Hemoglobin: 9.1 g/dL — ABNORMAL LOW (ref 13.0–17.0)
LYMPHS ABS: 1.3 10*3/uL (ref 0.7–4.0)
Lymphocytes Relative: 11.4 % — ABNORMAL LOW (ref 12.0–46.0)
MCHC: 32.7 g/dL (ref 30.0–36.0)
MCV: 98 fl (ref 78.0–100.0)
Monocytes Absolute: 1 10*3/uL (ref 0.1–1.0)
Monocytes Relative: 9.2 % (ref 3.0–12.0)
NEUTROS PCT: 77.2 % — AB (ref 43.0–77.0)
Neutro Abs: 8.5 10*3/uL — ABNORMAL HIGH (ref 1.4–7.7)
PLATELETS: 191 10*3/uL (ref 150.0–400.0)
RBC: 2.85 Mil/uL — ABNORMAL LOW (ref 4.22–5.81)
RDW: 13.7 % (ref 11.5–15.5)
WBC: 11.1 10*3/uL — ABNORMAL HIGH (ref 4.0–10.5)

## 2013-12-24 LAB — LDL CHOLESTEROL, DIRECT: LDL DIRECT: 92.1 mg/dL

## 2013-12-24 LAB — URINALYSIS
Bilirubin Urine: NEGATIVE
Hgb urine dipstick: NEGATIVE
Ketones, ur: NEGATIVE
LEUKOCYTES UA: NEGATIVE
Nitrite: NEGATIVE
Specific Gravity, Urine: 1.02 (ref 1.000–1.030)
UROBILINOGEN UA: 0.2 (ref 0.0–1.0)
Urine Glucose: NEGATIVE
pH: 5.5 (ref 5.0–8.0)

## 2013-12-24 LAB — HEPATIC FUNCTION PANEL
ALT: 41 U/L (ref 0–53)
AST: 22 U/L (ref 0–37)
Albumin: 4.3 g/dL (ref 3.5–5.2)
Alkaline Phosphatase: 53 U/L (ref 39–117)
BILIRUBIN DIRECT: 0 mg/dL (ref 0.0–0.3)
BILIRUBIN TOTAL: 0.6 mg/dL (ref 0.2–1.2)
Total Protein: 6.5 g/dL (ref 6.0–8.3)

## 2013-12-24 LAB — BASIC METABOLIC PANEL
BUN: 103 mg/dL — AB (ref 6–23)
CHLORIDE: 114 meq/L — AB (ref 96–112)
CO2: 10 meq/L — AB (ref 19–32)
CREATININE: 3.4 mg/dL — AB (ref 0.4–1.5)
Calcium: 9.8 mg/dL (ref 8.4–10.5)
GFR: 18.78 mL/min — ABNORMAL LOW (ref >60.00)
Glucose, Bld: 147 mg/dL — ABNORMAL HIGH (ref 70–99)
POTASSIUM: 3.8 meq/L (ref 3.5–5.1)
Sodium: 138 mEq/L (ref 135–145)

## 2013-12-24 LAB — PSA: PSA: 0.58 ng/mL (ref 0.10–4.00)

## 2013-12-24 LAB — LIPID PANEL
CHOLESTEROL: 154 mg/dL (ref 0–200)
HDL: 18.1 mg/dL — ABNORMAL LOW (ref >39.00)
NonHDL: 135.9
TRIGLYCERIDES: 287 mg/dL — AB (ref 0.0–149.0)
Total CHOL/HDL Ratio: 9
VLDL: 57.4 mg/dL — ABNORMAL HIGH (ref 0.0–40.0)

## 2013-12-24 LAB — TSH: TSH: 0.89 u[IU]/mL (ref 0.35–4.50)

## 2013-12-24 NOTE — Progress Notes (Signed)
   Subjective:    Patient ID: Marvin Morrison, male    DOB: [birth year], 73 y.o.   MRN: [id number]  HPI He was seen 12/05/13 for a physical; since that time he's had profound fatigue. A week after he was seen he was unable to walk his dog. This was not due to chest pain or shortness of breath but just generalized weakness  He's also had anorexia for the last week.  Over the weekend 12/19 and 12/22/13 had nausea and vomiting 3. He also had loose-watery stools.  The muscle weakness is not associated with muscle pain. He has no associated joint symptoms either  With the present symptoms he's noticed slight blurring  Of his vision when he was not wearing his glasses This is a new phenomena.  He has lost 8 pounds since that visit 12/3.  He was to get labs but these have not been scheduled. His last labs on record was 6/15. At that time creatinine was 1.9, BUN 50, and GFR 37.62.  It was his impression that his labs were normal. He is not monitoring his blood pressure at home. He is on chlorthalidone as well as an calcium channel blocker, and ACE inhibitor  Review of Systems   He specifically denies dysphagia. He has had some dyspepsia  There's been no hematemesis.  He has no fever, chills, sweats  He has no genitourinary symptoms such as dysuria, pyuria, hematuria.  There's been no associated hoarseness .  He denies any associated rash. He's had no tick exposure  There is no new changes in hair, skin, or nails  His wife denies excessive snoring or apnea.  He has no abnormal bruising or bleeding.     Objective:   Physical Exam  Pertinent or positive findings include: He ambulates with a cane; gait is slightly broad Ptosis present, greater on the left. He has severe caries to the gumline and below He has a long beard ,hair and mustache.Pattern alopecia. He exhibits a resting tachycardia with slight flow murmur.  General appearance :adequately nourished; in no distress. Eyes:  No conjunctival inflammation or scleral icterus is present. Oral exam:  Lips and gums are healthy appearing.There is no oropharyngeal erythema or exudate noted.  Lungs:Chest clear to auscultation; no wheezes, rhonchi,rales ,or rubs present.No increased work of breathing.  Abdomen: bowel sounds normal, soft and non-tender without masses, organomegaly or hernias noted.  No guarding or rebound.  Vascular : all pulses equal ; no bruits present. Skin:Warm & dry.  Intact without suspicious lesions or rashes ; no jaundice or tenting Lymphatic: No lymphadenopathy is noted about the head, neck, axilla          Assessment & Plan:  #1 profound fatigue   #2 hypotension in the context of 3 agents #3 renal insufficiency  Plan: See orders recommendations

## 2013-12-24 NOTE — Patient Instructions (Signed)
Your next office appointment will be determined based upon review of your pending labs . Those instructions will be transmitted to you through My Chart  OR  by mail;whichever process is your choice to receive results & recommendations .   Followup as needed for your acute issue. Please report any significant change in your symptoms.  Hold the Hygroton until labs are back.

## 2013-12-24 NOTE — Progress Notes (Signed)
Pre visit review using our clinic review tool, if applicable. No additional management support is needed unless otherwise documented below in the visit note. 

## 2013-12-25 ENCOUNTER — Telehealth: Payer: Self-pay

## 2013-12-25 ENCOUNTER — Other Ambulatory Visit (INDEPENDENT_AMBULATORY_CARE_PROVIDER_SITE_OTHER): Payer: Medicare Other

## 2013-12-25 ENCOUNTER — Telehealth: Payer: Self-pay | Admitting: *Deleted

## 2013-12-25 DIAGNOSIS — R739 Hyperglycemia, unspecified: Secondary | ICD-10-CM | POA: Diagnosis not present

## 2013-12-25 LAB — HEMOGLOBIN A1C: HEMOGLOBIN A1C: 5.9 % (ref 4.6–6.5)

## 2013-12-25 NOTE — Telephone Encounter (Signed)
-----   Message from Hendricks Limes, MD sent at 12/24/2013  6:34 PM EST ----- Please add A1c (R73.9)

## 2013-12-25 NOTE — Telephone Encounter (Signed)
Pt informed of below. 12/24/13 copy of labs mailed to pt. He would like to know Dr. Judeen Hammans opinion on the labs before he sees nephrologist. Pt is aware PCP is out of office until 12/30/13. Please advise.

## 2013-12-25 NOTE — Telephone Encounter (Signed)
Request for add on has been faxed to lab 

## 2013-12-25 NOTE — Telephone Encounter (Signed)
-----   Message from Hendricks Limes, MD sent at 12/24/2013  6:29 PM EST -----  BUN, creatinine, and GFR  all assess kidney function. To protect the kidneys it  is important to control your blood pressure and sugar. You should also stay well hydrated. Drink to thirst, up to 32 ounces of fluids per day.  Progressive kidney impairment is typically associated with anemia which is unrelated to  iron deficiency or B12 deficiency.Anemia is present & has progressed. STOP Hygroton & monitor BP.Minimal Blood Pressure Goal= AVERAGE < 140/90;  Ideal is an AVERAGE < 135/85. This AVERAGE should be calculated from @ least 5-7 BP readings taken @ different times of day on different days of week. You should not respond to isolated BP readings , but rather the AVERAGE for that week .Please bring your  blood pressure cuff to office visits to verify that it is reliable.It  can also be checked against the blood pressure device at the pharmacy. Finger or wrist cuffs are not dependable; an arm cuff is. The  glucose is elevated. An A1c , the Diabetes screening test will be added.  I recommend an Nephrology consultation to determine optimal therapy of kidney impairment

## 2013-12-26 NOTE — Telephone Encounter (Incomplete)
I agree w/Nephrology consult Thx

## 2013-12-31 ENCOUNTER — Other Ambulatory Visit: Payer: Self-pay

## 2013-12-31 MED ORDER — CHLORTHALIDONE 50 MG PO TABS: 50.0000 mg | ORAL_TABLET | Freq: Two times a day (BID) | ORAL | Status: DC

## 2014-01-09 ENCOUNTER — Telehealth: Payer: Self-pay | Admitting: Internal Medicine

## 2014-01-09 ENCOUNTER — Telehealth: Payer: Self-pay

## 2014-01-09 NOTE — Telephone Encounter (Signed)
Phone call from Beatrix Shipper at [facility] [address]. She states patient will be seen soon and Dr Joelyn Oms Request patient to be off his Lisinopril. [facility] will notify the patient of this. FYI for you.

## 2014-01-09 NOTE — Telephone Encounter (Signed)
Ok off Lisinopril Thx

## 2014-01-09 NOTE — Telephone Encounter (Signed)
Marvin Morrison, I am concerned about his judgement

## 2014-01-09 NOTE — Telephone Encounter (Signed)
Per Pam at [facility] pt was rated a number 1. They called pt and offered pt an appt for 01/10/14 and pt declined appt .

## 2014-01-09 NOTE — Telephone Encounter (Signed)
Marvin Morrison, your patient; I saw him acutely. [employer]

## 2014-01-19 NOTE — Telephone Encounter (Signed)
Stacey, please,call patient to confirm that he stopped  Lisinopril and chlorthalidone. Check BMET in 1 week OV w/results Thx

## 2014-01-21 NOTE — Telephone Encounter (Signed)
Noted. Pls ask him at least to repeat labs next wk - it is very important: he doesn't want his kidneys to fail... I'll f/u on labs w/him. Thx

## 2014-01-21 NOTE — Telephone Encounter (Signed)
Pt informed of below. He says he has not stopped taking any meds. He says he feels fine and nothing is wrong with him. He states he was only dehydrated and he feels fine. He declines coming any sooner than 05/22/14.

## 2014-01-22 NOTE — Telephone Encounter (Signed)
Notified pt with md response. Pt states there is nothing wrong with his kidneys he was just dehydrated for 10 days. He feels fine. The md that he saw just got him all work up for nothing. Will see Dr. Camila Li on 5/19...Marvin Morrison

## 2014-04-18 ENCOUNTER — Other Ambulatory Visit (INDEPENDENT_AMBULATORY_CARE_PROVIDER_SITE_OTHER): Payer: Medicare Other

## 2014-04-18 ENCOUNTER — Ambulatory Visit (INDEPENDENT_AMBULATORY_CARE_PROVIDER_SITE_OTHER)
Admission: RE | Admit: 2014-04-18 | Discharge: 2014-04-18 | Disposition: A | Discharge status: Home / Self Care | Payer: Medicare Other | Source: Ambulatory Visit | Attending: Internal Medicine | Admitting: Internal Medicine

## 2014-04-18 ENCOUNTER — Encounter: Payer: Self-pay | Admitting: Internal Medicine

## 2014-04-18 ENCOUNTER — Ambulatory Visit (INDEPENDENT_AMBULATORY_CARE_PROVIDER_SITE_OTHER): Payer: Medicare Other | Admitting: Internal Medicine

## 2014-04-18 VITALS — BP 92/78 | HR 105 | Wt 148.0 lb

## 2014-04-18 DIAGNOSIS — R531 Weakness: Secondary | ICD-10-CM

## 2014-04-18 DIAGNOSIS — D6489 Other specified anemias: Secondary | ICD-10-CM

## 2014-04-18 DIAGNOSIS — R112 Nausea with vomiting, unspecified: Secondary | ICD-10-CM

## 2014-04-18 DIAGNOSIS — I10 Essential (primary) hypertension: Secondary | ICD-10-CM

## 2014-04-18 DIAGNOSIS — N183 Chronic kidney disease, stage 3 unspecified: Secondary | ICD-10-CM

## 2014-04-18 DIAGNOSIS — R1110 Vomiting, unspecified: Secondary | ICD-10-CM | POA: Diagnosis not present

## 2014-04-18 DIAGNOSIS — R634 Abnormal weight loss: Secondary | ICD-10-CM

## 2014-04-18 LAB — CBC WITH DIFFERENTIAL/PLATELET
BASOS PCT: 0.1 % (ref 0.0–3.0)
Basophils Absolute: 0 10*3/uL (ref 0.0–0.1)
EOS PCT: 0.9 % (ref 0.0–5.0)
Eosinophils Absolute: 0.1 10*3/uL (ref 0.0–0.7)
HEMATOCRIT: 28.6 % — AB (ref 39.0–52.0)
HEMOGLOBIN: 9.7 g/dL — AB (ref 13.0–17.0)
LYMPHS ABS: 1 10*3/uL (ref 0.7–4.0)
Lymphocytes Relative: 8.1 % — ABNORMAL LOW (ref 12.0–46.0)
MCHC: 34 g/dL (ref 30.0–36.0)
MCV: 95.3 fl (ref 78.0–100.0)
MONOS PCT: 6.9 % (ref 3.0–12.0)
Monocytes Absolute: 0.9 10*3/uL (ref 0.1–1.0)
Neutro Abs: 10.6 10*3/uL — ABNORMAL HIGH (ref 1.4–7.7)
Neutrophils Relative %: 84 % — ABNORMAL HIGH (ref 43.0–77.0)
PLATELETS: 202 10*3/uL (ref 150.0–400.0)
RBC: 3 Mil/uL — ABNORMAL LOW (ref 4.22–5.81)
RDW: 13.5 % (ref 11.5–15.5)
WBC: 12.6 10*3/uL — AB (ref 4.0–10.5)

## 2014-04-18 LAB — HEPATIC FUNCTION PANEL
ALT: 30 U/L (ref 0–53)
AST: 12 U/L (ref 0–37)
Albumin: 4.4 g/dL (ref 3.5–5.2)
Alkaline Phosphatase: 65 U/L (ref 39–117)
BILIRUBIN DIRECT: 0.1 mg/dL (ref 0.0–0.3)
BILIRUBIN TOTAL: 0.4 mg/dL (ref 0.2–1.2)
Total Protein: 7.3 g/dL (ref 6.0–8.3)

## 2014-04-18 LAB — SEDIMENTATION RATE: Sed Rate: 54 mm/hr — ABNORMAL HIGH (ref 0–22)

## 2014-04-18 LAB — BASIC METABOLIC PANEL
BUN: 136 mg/dL (ref 6–23)
CHLORIDE: 103 meq/L (ref 96–112)
CO2: 21 meq/L (ref 19–32)
Calcium: 10.1 mg/dL (ref 8.4–10.5)
Creatinine, Ser: 3.56 mg/dL — ABNORMAL HIGH (ref 0.40–1.50)
GFR: 17.86 mL/min — ABNORMAL LOW (ref >60.00)
Glucose, Bld: 199 mg/dL — ABNORMAL HIGH (ref 70–99)
Potassium: 4.1 mEq/L (ref 3.5–5.1)
SODIUM: 138 meq/L (ref 135–145)

## 2014-04-18 LAB — LIPASE: Lipase: 203 U/L — ABNORMAL HIGH (ref 11.0–59.0)

## 2014-04-18 LAB — IBC PANEL
Iron: 127 ug/dL (ref 42–165)
SATURATION RATIOS: 48 % (ref 20.0–50.0)
TRANSFERRIN: 189 mg/dL — AB (ref 212.0–360.0)

## 2014-04-18 LAB — VITAMIN D 25 HYDROXY (VIT D DEFICIENCY, FRACTURES): VITD: 30.54 ng/mL (ref 30.00–100.00)

## 2014-04-18 LAB — AMMONIA: Ammonia: 22 umol/L (ref 11–35)

## 2014-04-18 LAB — MAGNESIUM: Magnesium: 2.3 mg/dL (ref 1.5–2.5)

## 2014-04-18 LAB — HEMOGLOBIN A1C: Hgb A1c MFr Bld: 5.8 % (ref 4.6–6.5)

## 2014-04-18 LAB — VITAMIN B12: Vitamin B-12: >1500 pg/mL — ABNORMAL HIGH (ref 211–911)

## 2014-04-18 LAB — TSH: TSH: 0.45 u[IU]/mL (ref 0.35–4.50)

## 2014-04-18 MED ORDER — PANTOPRAZOLE SODIUM 40 MG PO TBEC: 40.0000 mg | DELAYED_RELEASE_TABLET | Freq: Every day | ORAL | Status: DC

## 2014-04-18 MED ORDER — ONDANSETRON HCL 4 MG PO TABS: 4.0000 mg | ORAL_TABLET | Freq: Three times a day (TID) | ORAL | Status: DC | PRN

## 2014-04-18 MED ORDER — ONDANSETRON HCL 4 MG/2ML IJ SOLN
4.0000 mg | Freq: Once | INTRAMUSCULAR | Status: AC
  Administered 2014-04-18: 4 mg via INTRAMUSCULAR

## 2014-04-18 NOTE — Assessment & Plan Note (Addendum)
Labs incl SPEP, UPEP Abd [place] Marvin Morrison has refused a w/up in the past

## 2014-04-18 NOTE — Progress Notes (Signed)
Pre visit review using our clinic review tool, if applicable. No additional management support is needed unless otherwise documented below in the visit note. 

## 2014-04-18 NOTE — Assessment & Plan Note (Addendum)
?  etiology Multifactorial  Labs, incl UPEP, CPEP CXR Abd [place]  Go to ER if worse. Mitsugi wants to try to manage at home first (w/meds prescribed)

## 2014-04-18 NOTE — Assessment & Plan Note (Addendum)
Labs CXR Abd [place] Go to ER if worse. Krikor wants to try to manage at home first (w/meds prescribed)

## 2014-04-18 NOTE — Assessment & Plan Note (Signed)
Labs CXR Abd [place]

## 2014-04-18 NOTE — Progress Notes (Signed)
Subjective:    HPI  Marvin Morrison comes w/his wife. C/o n/v, wt loss x3 weeks or longer. No diarrhea lately. C/o weakness. [name] has been able to drink Boost only, 1-3 a day. He has stopped all his meds a while ago. No cough, CP, abd pain...  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  HENT: Negative for congestion, nosebleeds, sneezing, sore throat and trouble swallowing.   Eyes: Negative for redness, itching and visual disturbance.  Respiratory: Negative for cough, chest tightness, shortness of breath and wheezing.   Cardiovascular: Negative for chest pain, palpitations and leg swelling.  Gastrointestinal: Positive for nausea and vomiting. Negative for abdominal pain, diarrhea, constipation, blood in stool and abdominal distention.  Genitourinary: Negative for frequency and hematuria.  Musculoskeletal: Positive for gait problem. Negative for back pain, joint swelling, arthralgias and neck pain.  Skin: Negative for pallor, rash and wound.  Neurological: Positive for dizziness, weakness and light-headedness. Negative for tremors, speech difficulty and numbness.  Psychiatric/Behavioral: Negative for suicidal ideas, confusion, sleep disturbance, dysphoric mood and agitation. The patient is not nervous/anxious.        Objective:   Physical Exam  Constitutional: He is oriented to person, place, and time. He appears well-developed. No distress.  Appears chronically ill. Thin. Weak.  HENT:  Mouth/Throat: Oropharynx is clear  and moist. No oropharyngeal exudate.  Eyes: Conjunctivae are normal. Pupils are equal, round, and reactive to light.  Neck: Normal range of motion. No JVD present. No thyromegaly present.  Cardiovascular: Normal rate, normal heart sounds and intact distal pulses.  Exam reveals no gallop and no friction rub.   No murmur heard. Tachy - mild  Pulmonary/Chest: Effort normal and breath sounds normal. No respiratory distress. He has no wheezes. He has no rales. He exhibits no tenderness.  Abdominal: Soft. Bowel sounds are normal. He exhibits no distension and no mass. There is no tenderness. There is no rebound and no guarding.  Musculoskeletal: Normal range of motion. He exhibits no edema or tenderness.  Lymphadenopathy:    He has no cervical adenopathy.  Neurological: He is alert and oriented to person, place, and time. He has normal reflexes. No cranial nerve deficit. He exhibits normal muscle tone. He displays a negative Romberg sign. Coordination and gait normal.  Skin: Skin is warm and dry. No rash noted. No pallor.  Psychiatric: He has a normal mood and affect. His behavior is normal. Judgment and thought content normal.    Lab Results  Component Value Date   WBC 11.1* 12/24/2013   HGB 9.1* 12/24/2013   HCT 27.9* 12/24/2013   PLT 191.0 12/24/2013   GLUCOSE 147* 12/24/2013   CHOL 154 12/24/2013   TRIG 287.0* 12/24/2013   HDL 18.10* 12/24/2013   LDLDIRECT 92.1 12/24/2013   LDLCALC 102* 06/13/2013   ALT 41 12/24/2013   AST 22 12/24/2013   NA 138 12/24/2013   K 3.8 12/24/2013   CL 114* 12/24/2013   CREATININE 3.4* 12/24/2013   BUN 103* 12/24/2013   CO2 10*  12/24/2013   TSH 0.89 12/24/2013   PSA 0.58 12/24/2013   HGBA1C 5.9 12/25/2013     A complex case     Assessment & Plan:

## 2014-04-19 ENCOUNTER — Telehealth: Payer: Self-pay | Admitting: Family Medicine

## 2014-04-19 DIAGNOSIS — R112 Nausea with vomiting, unspecified: Secondary | ICD-10-CM | POA: Insufficient documentation

## 2014-04-19 NOTE — Assessment & Plan Note (Signed)
Go to ER if worse. Kriss wants to try to manage at home first (w/meds prescribed) Zofran IM/PO Protonix qd Abd [place] Labs

## 2014-04-19 NOTE — Telephone Encounter (Signed)
Apparently I have no access to his full lab report this weekend but regardless I am very alarmed about his health status. He was in renal failure last December when his BUN was 130 and his creatinine was 3.4. He was advised to see Nephrology but he refused. Then when he was seen yesterday he was anoriexic, vomiting, losing weight, and very weak. In my opinion he needs to be admitted to the hospital, and the one lab result we do have shows his kidneys are even worse than they were before. Please call the patient and insist that he go to the ER this morning to be evaluated and treated.

## 2014-04-19 NOTE — Telephone Encounter (Signed)
I received a call yesterday about 5:30 pm from [facility] that the lab had called to report a critical value of BUN at 136. The lab person told the [facility] nurse that "everything else was normal". I then checked the computer for the rest of the results but none were available. I checked again for results this am but nothing is available in the computer. Please check with [facility] to see if we can get these results.

## 2014-04-19 NOTE — Telephone Encounter (Signed)
Called and spoke with pt and pt states " I do not want to hear from any doctor except Dr. Alain Marion."  Advised pt that Dr. Sarajane Jews was the on call physician for the weekend and received a phone call from the lab about pt's lab results.  Pt states " I really only want to hear from Dr. Alain Marion but go ahead and tell me what he had to say."  Advised pt of Dr. Barbie Banner recommendations and pt states "Dr. Alain Marion gave me a medication yesterday and it is working.  I am able to keep my food down.  I only want to deal with the doctor I have been seeing.  Yesterday, Dr. Alain Marion told me not to go to the ER. Have him call me on Monday."  Advised pt that I would send this information to Dr. Alain Marion.

## 2014-04-19 NOTE — Assessment & Plan Note (Signed)
Off all meds x 1 month

## 2014-04-19 NOTE — Telephone Encounter (Signed)
Called [facility] and spoke with Tammara and she states pt's lab were not sent to there.  Further investigation and pt's labs were done downstairs in the lab at [facility].

## 2014-04-21 ENCOUNTER — Other Ambulatory Visit: Payer: Medicare Other

## 2014-04-21 DIAGNOSIS — R531 Weakness: Secondary | ICD-10-CM | POA: Diagnosis not present

## 2014-04-21 DIAGNOSIS — I10 Essential (primary) hypertension: Secondary | ICD-10-CM

## 2014-04-21 DIAGNOSIS — D6489 Other specified anemias: Secondary | ICD-10-CM | POA: Diagnosis not present

## 2014-04-21 DIAGNOSIS — N183 Chronic kidney disease, stage 3 unspecified: Secondary | ICD-10-CM

## 2014-04-21 DIAGNOSIS — R112 Nausea with vomiting, unspecified: Secondary | ICD-10-CM

## 2014-04-21 DIAGNOSIS — R634 Abnormal weight loss: Secondary | ICD-10-CM | POA: Diagnosis not present

## 2014-04-21 LAB — H. PYLORI ANTIBODY, IGG: H PYLORI IGG: NEGATIVE

## 2014-04-21 NOTE — Addendum Note (Signed)
Addended by: Cresenciano Lick on: 04/21/2014 08:06 AM   Modules accepted: Orders

## 2014-04-22 NOTE — Addendum Note (Signed)
Addended by: Cresenciano Lick on: 04/22/2014 01:14 PM   Modules accepted: Orders

## 2014-04-23 ENCOUNTER — Telehealth: Payer: Self-pay | Admitting: *Deleted

## 2014-04-23 LAB — PROTEIN ELECTROPHORESIS, SERUM
Albumin ELP: 4.4 g/dL (ref 3.8–4.8)
Alpha-1-Globulin: 0.4 g/dL — ABNORMAL HIGH (ref 0.2–0.3)
Alpha-2-Globulin: 1 g/dL — ABNORMAL HIGH (ref 0.5–0.9)
BETA 2: 0.8 g/dL — AB (ref 0.2–0.5)
BETA GLOBULIN: 0.4 g/dL (ref 0.4–0.6)
Gamma Globulin: 0.6 g/dL — ABNORMAL LOW (ref 0.8–1.7)
Total Protein, Serum Electrophoresis: 7.6 g/dL (ref 6.1–8.1)

## 2014-04-23 NOTE — Telephone Encounter (Signed)
[place] Night - Client TELEPHONE [facility] Patient Name: Marvin Morrison Gender: Male DOB: 04/01/1940 Age: 74 Y 58 M 2 D Return Phone Number: [phone] (Primary) Address: City/State/Zip: [place] Client [facility] Night - Client Client Site [place] - Night Physician Plotnikov, Alex Contact Type Call Call Type Triage / Clinical Caller Name Gordy Levan Relationship To Patient Provider Return Phone Number [phone] (Primary) Chief Complaint Lab Result (Critical) Initial Comment Caller States Gordy Levan From [facility] at ph [phone], critical Lab, ref number none Nurse Assessment Nurse: Justine Null, RN, Rodena Piety Date/Time (Eastern Time): 04/18/2014 5:49:55 PM Is there an on-call provider listed? ---Yes Please list name of person reporting value (Lab Employee) and a contact number. ---Caller States Gill From [facility] at ph [phone], critical Lab, ref number none Please document the following items: Lab name Lab value (read back to lab to verify) Reference range for lab value Date and time blood was drawn ---BUN 136 drawn on 04/18/2014 at 1627 and reference value is 6 to 23 Please collect the patient contact information from the lab. (name, phone number and address) ---as per the chart List any special notes provided by lab. ---last BUN on 12/2013 103 Guidelines Guideline Title Affirmed Question Affirmed Notes Nurse Date/Time (Eastern Time) Disp. Time Eilene Ghazi Time) Disposition Final User 04/18/2014 5:59:38 PM Paged On Call back to [facility], RN, Rodena Piety 04/18/2014 6:17:52 PM Call Completed Justine Null, RN, Rodena Piety 04/18/2014 6:18:05 PM Clinical Call Yes Justine Null, RN, Rodena Piety After Care Instructions Given Call Event Type User Date / Time Description

## 2014-04-25 ENCOUNTER — Encounter: Payer: Self-pay | Admitting: Internal Medicine

## 2014-04-25 ENCOUNTER — Other Ambulatory Visit (INDEPENDENT_AMBULATORY_CARE_PROVIDER_SITE_OTHER): Payer: Medicare Other

## 2014-04-25 ENCOUNTER — Ambulatory Visit (HOSPITAL_BASED_OUTPATIENT_CLINIC_OR_DEPARTMENT_OTHER)
Admission: RE | Admit: 2014-04-25 | Discharge: 2014-04-25 | Disposition: A | Discharge status: Home / Self Care | Payer: Medicare Other | Source: Ambulatory Visit | Attending: Internal Medicine | Admitting: Internal Medicine

## 2014-04-25 ENCOUNTER — Ambulatory Visit (INDEPENDENT_AMBULATORY_CARE_PROVIDER_SITE_OTHER): Payer: Medicare Other | Admitting: Internal Medicine

## 2014-04-25 VITALS — BP 92/60 | HR 114 | Wt 151.0 lb

## 2014-04-25 DIAGNOSIS — N183 Chronic kidney disease, stage 3 unspecified: Secondary | ICD-10-CM

## 2014-04-25 DIAGNOSIS — R634 Abnormal weight loss: Secondary | ICD-10-CM

## 2014-04-25 DIAGNOSIS — I10 Essential (primary) hypertension: Secondary | ICD-10-CM

## 2014-04-25 DIAGNOSIS — N2889 Other specified disorders of kidney and ureter: Secondary | ICD-10-CM | POA: Diagnosis not present

## 2014-04-25 DIAGNOSIS — R112 Nausea with vomiting, unspecified: Secondary | ICD-10-CM | POA: Insufficient documentation

## 2014-04-25 DIAGNOSIS — R531 Weakness: Secondary | ICD-10-CM

## 2014-04-25 LAB — HEPATIC FUNCTION PANEL
ALT: 29 U/L (ref 0–53)
AST: 18 U/L (ref 0–37)
Albumin: 3.9 g/dL (ref 3.5–5.2)
Alkaline Phosphatase: 60 U/L (ref 39–117)
BILIRUBIN TOTAL: 0.3 mg/dL (ref 0.2–1.2)
Bilirubin, Direct: 0.1 mg/dL (ref 0.0–0.3)
Total Protein: 6.9 g/dL (ref 6.0–8.3)

## 2014-04-25 LAB — URINALYSIS, ROUTINE W REFLEX MICROSCOPIC
Bilirubin Urine: NEGATIVE
Hgb urine dipstick: NEGATIVE
Ketones, ur: NEGATIVE
LEUKOCYTES UA: NEGATIVE
Nitrite: NEGATIVE
RBC / HPF: NONE SEEN (ref 0–?)
Specific Gravity, Urine: 1.02 (ref 1.000–1.030)
TOTAL PROTEIN, URINE-UPE24: NEGATIVE
Urine Glucose: NEGATIVE
Urobilinogen, UA: 0.2 (ref 0.0–1.0)
pH: 5.5 (ref 5.0–8.0)

## 2014-04-25 LAB — CBC WITH DIFFERENTIAL/PLATELET
Basophils Absolute: 0 10*3/uL (ref 0.0–0.1)
Basophils Relative: 0.2 % (ref 0.0–3.0)
EOS PCT: 3.3 % (ref 0.0–5.0)
Eosinophils Absolute: 0.4 10*3/uL (ref 0.0–0.7)
HCT: 25.6 % — ABNORMAL LOW (ref 39.0–52.0)
Hemoglobin: 8.6 g/dL — ABNORMAL LOW (ref 13.0–17.0)
LYMPHS PCT: 9.7 % — AB (ref 12.0–46.0)
Lymphs Abs: 1.3 10*3/uL (ref 0.7–4.0)
MCHC: 33.5 g/dL (ref 30.0–36.0)
MCV: 95.5 fl (ref 78.0–100.0)
MONO ABS: 1.5 10*3/uL — AB (ref 0.1–1.0)
Monocytes Relative: 11.7 % (ref 3.0–12.0)
Neutro Abs: 9.8 10*3/uL — ABNORMAL HIGH (ref 1.4–7.7)
Neutrophils Relative %: 75.1 % (ref 43.0–77.0)
PLATELETS: 163 10*3/uL (ref 150.0–400.0)
RDW: 12.7 % (ref 11.5–15.5)
WBC: 13 10*3/uL — ABNORMAL HIGH (ref 4.0–10.5)

## 2014-04-25 LAB — BASIC METABOLIC PANEL
BUN: 64 mg/dL — ABNORMAL HIGH (ref 6–23)
CO2: 25 mEq/L (ref 19–32)
Calcium: 8.9 mg/dL (ref 8.4–10.5)
Chloride: 98 mEq/L (ref 96–112)
Creatinine, Ser: 2.86 mg/dL — ABNORMAL HIGH (ref 0.40–1.50)
GFR: 22.99 mL/min — ABNORMAL LOW (ref >60.00)
Glucose, Bld: 149 mg/dL — ABNORMAL HIGH (ref 70–99)
Potassium: 3.4 mEq/L — ABNORMAL LOW (ref 3.5–5.1)
Sodium: 133 mEq/L — ABNORMAL LOW (ref 135–145)

## 2014-04-25 LAB — SEDIMENTATION RATE: SED RATE: 94 mm/h — AB (ref 0–22)

## 2014-04-25 LAB — LIPASE: Lipase: 131 U/L — ABNORMAL HIGH (ref 11.0–59.0)

## 2014-04-25 NOTE — Assessment & Plan Note (Signed)
Resolved  Pt had [place] this am Repeat labs

## 2014-04-25 NOTE — Assessment & Plan Note (Signed)
Wt Readings from Last 3 Encounters:  04/25/14 151 lb (68.493 kg)  04/18/14 148 lb (67.132 kg)  12/24/13 160 lb (72.576 kg)

## 2014-04-25 NOTE — Assessment & Plan Note (Signed)
Off Rx now   

## 2014-04-25 NOTE — Progress Notes (Signed)
Pre visit review using our clinic review tool, if applicable. No additional management support is needed unless otherwise documented below in the visit note. 

## 2014-04-25 NOTE — Assessment & Plan Note (Signed)
  Pt had [place] this am Repeat labs

## 2014-04-27 NOTE — Progress Notes (Signed)
Subjective:    HPI  Marvin Morrison comes w/his wife. F/u n/v, wt loss x3 weeks or longer - much better. No diarrhea lately.   F/u weakness - much better. No cough, CP, abd pain...  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  HENT: Negative for congestion, nosebleeds, sneezing, sore throat and trouble swallowing.   Eyes: Negative for redness, itching and visual disturbance.  Respiratory: Negative for cough, chest tightness, shortness of breath and wheezing.   Cardiovascular: Negative for chest pain, palpitations and leg swelling.  Gastrointestinal: Positive for nausea and vomiting. Negative for abdominal pain, diarrhea, constipation, blood in stool and abdominal distention.  Genitourinary: Negative for frequency and hematuria.  Musculoskeletal: Positive for gait problem. Negative for back pain, joint swelling, arthralgias and neck pain.  Skin: Negative for pallor, rash and wound.  Neurological: Positive for dizziness, weakness and light-headedness. Negative for tremors, speech difficulty and numbness.  Psychiatric/Behavioral: Negative for suicidal ideas, confusion, sleep disturbance, dysphoric mood and agitation. The patient is not nervous/anxious.        Objective:   Physical Exam  Constitutional: He is oriented to person, place, and time. He appears well-developed. No distress.  Appears chronically ill. Thin. Weak.  HENT:  Mouth/Throat: Oropharynx is clear and moist. No oropharyngeal exudate.  Eyes: Conjunctivae are  normal. Pupils are equal, round, and reactive to light.  Neck: Normal range of motion. No JVD present. No thyromegaly present.  Cardiovascular: Normal rate, normal heart sounds and intact distal pulses.  Exam reveals no gallop and no friction rub.   No murmur heard. Tachy - mild  Pulmonary/Chest: Effort normal and breath sounds normal. No respiratory distress. He has no wheezes. He has no rales. He exhibits no tenderness.  Abdominal: Soft. Bowel sounds are normal. He exhibits no distension and no mass. There is no tenderness. There is no rebound and no guarding.  Musculoskeletal: Normal range of motion. He exhibits no edema or tenderness.  Lymphadenopathy:    He has no cervical adenopathy.  Neurological: He is alert and oriented to person, place, and time. He has normal reflexes. No cranial nerve deficit. He exhibits normal muscle tone. He displays a negative Romberg sign. Coordination and gait normal.  Skin: Skin is warm and dry. No rash noted. No pallor.  Psychiatric: He has a normal mood and affect. His behavior is normal. Judgment and thought content normal.    Lab Results  Component Value Date   WBC 13.0* 04/25/2014   HGB 8.6 Repeated and verified X2.* 04/25/2014   HCT 25.6 Repeated and verified X2.* 04/25/2014   PLT 163.0 04/25/2014   GLUCOSE 149* 04/25/2014   CHOL 154 12/24/2013   TRIG 287.0* 12/24/2013   HDL 18.10* 12/24/2013   LDLDIRECT 92.1 12/24/2013   LDLCALC 102* 06/13/2013   ALT 29 04/25/2014   AST 18 04/25/2014   NA 133* 04/25/2014   K 3.4* 04/25/2014   CL 98 04/25/2014   CREATININE 2.86* 04/25/2014   BUN 64* 04/25/2014   CO2 25 04/25/2014   TSH  0.45 04/18/2014   PSA 0.58 12/24/2013   HGBA1C 5.8 04/18/2014     A complex case     Assessment & Plan:

## 2014-04-29 LAB — PROTEIN ELECTROPHORESIS, URINE REFLEX: Total Protein, Urine: 39 mg/dL

## 2014-05-04 ENCOUNTER — Other Ambulatory Visit: Payer: Self-pay | Admitting: Internal Medicine

## 2014-05-05 ENCOUNTER — Other Ambulatory Visit: Payer: Self-pay

## 2014-05-05 NOTE — Telephone Encounter (Signed)
Please advise, thanks.

## 2014-05-22 ENCOUNTER — Ambulatory Visit: Payer: Medicare Other | Admitting: Internal Medicine

## 2014-05-28 ENCOUNTER — Other Ambulatory Visit (INDEPENDENT_AMBULATORY_CARE_PROVIDER_SITE_OTHER): Payer: Medicare Other

## 2014-05-28 ENCOUNTER — Ambulatory Visit (INDEPENDENT_AMBULATORY_CARE_PROVIDER_SITE_OTHER): Payer: Medicare Other | Admitting: Internal Medicine

## 2014-05-28 ENCOUNTER — Encounter: Payer: Self-pay | Admitting: Internal Medicine

## 2014-05-28 VITALS — BP 160/90 | HR 90 | Wt 175.0 lb

## 2014-05-28 DIAGNOSIS — R634 Abnormal weight loss: Secondary | ICD-10-CM

## 2014-05-28 DIAGNOSIS — R112 Nausea with vomiting, unspecified: Secondary | ICD-10-CM

## 2014-05-28 DIAGNOSIS — N184 Chronic kidney disease, stage 4 (severe): Secondary | ICD-10-CM

## 2014-05-28 DIAGNOSIS — N183 Chronic kidney disease, stage 3 unspecified: Secondary | ICD-10-CM

## 2014-05-28 DIAGNOSIS — N2889 Other specified disorders of kidney and ureter: Secondary | ICD-10-CM

## 2014-05-28 DIAGNOSIS — I10 Essential (primary) hypertension: Secondary | ICD-10-CM

## 2014-05-28 DIAGNOSIS — R197 Diarrhea, unspecified: Secondary | ICD-10-CM

## 2014-05-28 DIAGNOSIS — D509 Iron deficiency anemia, unspecified: Secondary | ICD-10-CM

## 2014-05-28 DIAGNOSIS — D6489 Other specified anemias: Secondary | ICD-10-CM | POA: Diagnosis not present

## 2014-05-28 DIAGNOSIS — R531 Weakness: Secondary | ICD-10-CM

## 2014-05-28 LAB — CBC WITH DIFFERENTIAL/PLATELET
Basophils Absolute: 0 10*3/uL (ref 0.0–0.1)
Basophils Relative: 0.3 % (ref 0.0–3.0)
EOS PCT: 2.2 % (ref 0.0–5.0)
Eosinophils Absolute: 0.2 10*3/uL (ref 0.0–0.7)
HCT: 26.5 % — ABNORMAL LOW (ref 39.0–52.0)
Hemoglobin: 8.9 g/dL — ABNORMAL LOW (ref 13.0–17.0)
LYMPHS ABS: 1.4 10*3/uL (ref 0.7–4.0)
Lymphocytes Relative: 14 % (ref 12.0–46.0)
MCHC: 33.6 g/dL (ref 30.0–36.0)
MCV: 94.1 fl (ref 78.0–100.0)
Monocytes Absolute: 0.9 10*3/uL (ref 0.1–1.0)
Monocytes Relative: 8.6 % (ref 3.0–12.0)
NEUTROS PCT: 74.9 % (ref 43.0–77.0)
Neutro Abs: 7.7 10*3/uL (ref 1.4–7.7)
Platelets: 193 10*3/uL (ref 150.0–400.0)
RBC: 2.82 Mil/uL — AB (ref 4.22–5.81)
RDW: 13.9 % (ref 11.5–15.5)
WBC: 10.3 10*3/uL (ref 4.0–10.5)

## 2014-05-28 LAB — IBC PANEL
IRON: 49 ug/dL (ref 42–165)
Saturation Ratios: 16.2 % — ABNORMAL LOW (ref 20.0–50.0)
TRANSFERRIN: 216 mg/dL (ref 212.0–360.0)

## 2014-05-28 LAB — BASIC METABOLIC PANEL
BUN: 27 mg/dL — ABNORMAL HIGH (ref 6–23)
CHLORIDE: 102 meq/L (ref 96–112)
CO2: 28 mEq/L (ref 19–32)
Calcium: 9 mg/dL (ref 8.4–10.5)
Creatinine, Ser: 1.83 mg/dL — ABNORMAL HIGH (ref 0.40–1.50)
GFR: 38.48 mL/min — AB (ref >60.00)
GLUCOSE: 113 mg/dL — AB (ref 70–99)
Potassium: 4.5 mEq/L (ref 3.5–5.1)
SODIUM: 137 meq/L (ref 135–145)

## 2014-05-28 LAB — LIPASE: Lipase: 62 U/L — ABNORMAL HIGH (ref 11.0–59.0)

## 2014-05-28 MED ORDER — FERROUS SULFATE 325 (65 FE) MG PO TABS: 325.0000 mg | ORAL_TABLET | Freq: Every day | ORAL | Status: DC

## 2014-05-28 MED ORDER — VITAMIN D 1000 UNITS PO TABS: 1000.0000 [IU] | ORAL_TABLET | Freq: Every day | ORAL | Status: AC

## 2014-05-28 NOTE — Progress Notes (Signed)
Subjective:    HPI  Marvin Morrison comes w/his wife. F/u n/v, wt loss - resolved. No diarrhea lately.   F/u weakness - much better. No cough, CP, abd pain...  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  HENT: Negative for congestion, nosebleeds, sneezing, sore throat and trouble swallowing.   Eyes: Negative for redness, itching and visual disturbance.  Respiratory: Negative for cough, chest tightness, shortness of breath and wheezing.   Cardiovascular: Negative for chest pain, palpitations and leg swelling.  Gastrointestinal: Positive for nausea and vomiting. Negative for abdominal pain, diarrhea, constipation, blood in stool and abdominal distention.  Genitourinary: Negative for frequency and hematuria.  Musculoskeletal: Positive for gait problem. Negative for back pain, joint swelling, arthralgias and neck pain.  Skin: Negative for pallor, rash and wound.  Neurological: Positive for dizziness, weakness and light-headedness. Negative for tremors, speech difficulty and numbness.  Psychiatric/Behavioral: Negative for suicidal ideas, confusion, sleep disturbance, dysphoric mood and agitation. The patient is not nervous/anxious.        Objective:   Physical Exam  Constitutional: He is oriented to person, place, and time. He appears well-developed. No distress.  Appears chronically ill. Thin. Weak.  HENT:  Mouth/Throat: Oropharynx is clear and moist. No oropharyngeal exudate.  Eyes: Conjunctivae are normal. Pupils are  equal, round, and reactive to light.  Neck: Normal range of motion. No JVD present. No thyromegaly present.  Cardiovascular: Normal rate, normal heart sounds and intact distal pulses.  Exam reveals no gallop and no friction rub.   No murmur heard. Tachy - mild  Pulmonary/Chest: Effort normal and breath sounds normal. No respiratory distress. He has no wheezes. He has no rales. He exhibits no tenderness.  Abdominal: Soft. Bowel sounds are normal. He exhibits no distension and no mass. There is no tenderness. There is no rebound and no guarding.  Musculoskeletal: Normal range of motion. He exhibits no edema or tenderness.  Lymphadenopathy:    He has no cervical adenopathy.  Neurological: He is alert and oriented to person, place, and time. He has normal reflexes. No cranial nerve deficit. He exhibits normal muscle tone. He displays a negative Romberg sign. Coordination and gait normal.  Skin: Skin is warm and dry. No rash noted. No pallor.  Psychiatric: He has a normal mood and affect. His behavior is normal. Judgment and thought content normal.    Lab Results  Component Value Date   WBC 13.0* 04/25/2014   HGB 8.6 Repeated and verified X2.* 04/25/2014   HCT 25.6 Repeated and verified X2.* 04/25/2014   PLT 163.0 04/25/2014   GLUCOSE 149* 04/25/2014   CHOL 154 12/24/2013   TRIG 287.0* 12/24/2013   HDL 18.10* 12/24/2013   LDLDIRECT 92.1 12/24/2013   LDLCALC 102* 06/13/2013   ALT 29 04/25/2014   AST 18 04/25/2014   NA 133* 04/25/2014   K 3.4* 04/25/2014   CL 98 04/25/2014   CREATININE 2.86* 04/25/2014   BUN 64* 04/25/2014   CO2 25 04/25/2014   TSH 0.45 04/18/2014   PSA  0.58 12/24/2013   HGBA1C 5.8 04/18/2014   A complex case     Assessment & Plan:

## 2014-05-28 NOTE — Assessment & Plan Note (Signed)
Resolved

## 2014-05-28 NOTE — Assessment & Plan Note (Signed)
No diarrhea

## 2014-05-28 NOTE — Assessment & Plan Note (Signed)
Labs today

## 2014-05-28 NOTE — Assessment & Plan Note (Signed)
Labs

## 2014-05-28 NOTE — Progress Notes (Signed)
Pre visit review using our clinic review tool, if applicable. No additional management support is needed unless otherwise documented below in the visit note. 

## 2014-05-29 ENCOUNTER — Telehealth: Payer: Self-pay | Admitting: *Deleted

## 2014-05-29 NOTE — Telephone Encounter (Signed)
Pt wants to know if it is ok to take Vit E. Please advise.

## 2014-05-30 LAB — PROTEIN ELECTROPHORESIS, SERUM, WITH REFLEX
ALBUMIN ELP: 3.8 g/dL (ref 3.8–4.8)
ALPHA-1-GLOBULIN: 0.5 g/dL — AB (ref 0.2–0.3)
Alpha-2-Globulin: 0.8 g/dL (ref 0.5–0.9)
BETA GLOBULIN: 0.4 g/dL (ref 0.4–0.6)
Beta 2: 0.2 g/dL (ref 0.2–0.5)
GAMMA GLOBULIN: 0.6 g/dL — AB (ref 0.8–1.7)
Total Protein, Serum Electrophoresis: 6.2 g/dL (ref 6.1–8.1)

## 2014-05-30 LAB — IFE INTERPRETATION

## 2014-05-30 NOTE — Telephone Encounter (Signed)
No need to take Vit E Thx

## 2014-05-30 NOTE — Telephone Encounter (Signed)
LVM for pt to call back as soon as possible. \  RE: MD response below.  

## 2014-05-30 NOTE — Telephone Encounter (Signed)
Gave patient MD response.

## 2014-05-31 LAB — IGG, IGA, IGM
IGA: 121 mg/dL (ref 68–379)
IgG (Immunoglobin G), Serum: 680 mg/dL (ref 650–1600)
IgM, Serum: 25 mg/dL — ABNORMAL LOW (ref 41–251)

## 2014-07-24 ENCOUNTER — Other Ambulatory Visit: Payer: Self-pay | Admitting: Internal Medicine

## 2014-07-24 NOTE — Telephone Encounter (Signed)
Noted  

## 2014-08-01 ENCOUNTER — Other Ambulatory Visit: Payer: Self-pay | Admitting: Internal Medicine

## 2014-08-07 ENCOUNTER — Other Ambulatory Visit: Payer: Self-pay | Admitting: Internal Medicine

## 2014-09-03 ENCOUNTER — Other Ambulatory Visit: Payer: Self-pay | Admitting: *Deleted

## 2014-09-03 MED ORDER — ONDANSETRON HCL 4 MG PO TABS: 4.0000 mg | ORAL_TABLET | Freq: Three times a day (TID) | ORAL | Status: DC | PRN

## 2014-09-05 ENCOUNTER — Other Ambulatory Visit: Payer: Self-pay | Admitting: Internal Medicine

## 2014-09-09 NOTE — Telephone Encounter (Signed)
Patient is requesting call back in regards. °

## 2014-09-09 NOTE — Telephone Encounter (Signed)
Patient called in.  States face is breaking out and needs script as soon as possible.

## 2014-09-30 ENCOUNTER — Other Ambulatory Visit (INDEPENDENT_AMBULATORY_CARE_PROVIDER_SITE_OTHER): Payer: Medicare Other

## 2014-09-30 ENCOUNTER — Encounter: Payer: Self-pay | Admitting: Internal Medicine

## 2014-09-30 ENCOUNTER — Ambulatory Visit (INDEPENDENT_AMBULATORY_CARE_PROVIDER_SITE_OTHER): Payer: Medicare Other | Admitting: Internal Medicine

## 2014-09-30 VITALS — BP 160/70 | HR 83 | Wt 184.0 lb

## 2014-09-30 DIAGNOSIS — R197 Diarrhea, unspecified: Secondary | ICD-10-CM | POA: Diagnosis not present

## 2014-09-30 DIAGNOSIS — I10 Essential (primary) hypertension: Secondary | ICD-10-CM

## 2014-09-30 DIAGNOSIS — R112 Nausea with vomiting, unspecified: Secondary | ICD-10-CM | POA: Diagnosis not present

## 2014-09-30 DIAGNOSIS — R634 Abnormal weight loss: Secondary | ICD-10-CM

## 2014-09-30 DIAGNOSIS — N183 Chronic kidney disease, stage 3 unspecified: Secondary | ICD-10-CM

## 2014-09-30 DIAGNOSIS — D509 Iron deficiency anemia, unspecified: Secondary | ICD-10-CM | POA: Diagnosis not present

## 2014-09-30 DIAGNOSIS — N2889 Other specified disorders of kidney and ureter: Secondary | ICD-10-CM

## 2014-09-30 LAB — CBC WITH DIFFERENTIAL/PLATELET
BASOS ABS: 0 10*3/uL (ref 0.0–0.1)
BASOS PCT: 0.4 % (ref 0.0–3.0)
EOS ABS: 0.2 10*3/uL (ref 0.0–0.7)
Eosinophils Relative: 2.5 % (ref 0.0–5.0)
HEMATOCRIT: 34 % — AB (ref 39.0–52.0)
HEMOGLOBIN: 11.4 g/dL — AB (ref 13.0–17.0)
LYMPHS PCT: 14.7 % (ref 12.0–46.0)
Lymphs Abs: 1.4 10*3/uL (ref 0.7–4.0)
MCHC: 33.6 g/dL (ref 30.0–36.0)
MCV: 92.6 fl (ref 78.0–100.0)
MONO ABS: 0.7 10*3/uL (ref 0.1–1.0)
Monocytes Relative: 7.8 % (ref 3.0–12.0)
Neutro Abs: 7 10*3/uL (ref 1.4–7.7)
Neutrophils Relative %: 74.6 % (ref 43.0–77.0)
Platelets: 183 10*3/uL (ref 150.0–400.0)
RBC: 3.68 Mil/uL — AB (ref 4.22–5.81)
RDW: 15.3 % (ref 11.5–15.5)
WBC: 9.4 10*3/uL (ref 4.0–10.5)

## 2014-09-30 LAB — BASIC METABOLIC PANEL
BUN: 30 mg/dL — AB (ref 6–23)
CHLORIDE: 100 meq/L (ref 96–112)
CO2: 29 meq/L (ref 19–32)
Calcium: 9.5 mg/dL (ref 8.4–10.5)
Creatinine, Ser: 1.89 mg/dL — ABNORMAL HIGH (ref 0.40–1.50)
GFR: 37.04 mL/min — AB (ref >60.00)
GLUCOSE: 113 mg/dL — AB (ref 70–99)
POTASSIUM: 4 meq/L (ref 3.5–5.1)
SODIUM: 136 meq/L (ref 135–145)

## 2014-09-30 LAB — HEPATIC FUNCTION PANEL
ALBUMIN: 4.5 g/dL (ref 3.5–5.2)
ALT: 24 U/L (ref 0–53)
AST: 20 U/L (ref 0–37)
Alkaline Phosphatase: 80 U/L (ref 39–117)
BILIRUBIN TOTAL: 0.4 mg/dL (ref 0.2–1.2)
Bilirubin, Direct: 0 mg/dL (ref 0.0–0.3)
Total Protein: 7.2 g/dL (ref 6.0–8.3)

## 2014-09-30 LAB — SEDIMENTATION RATE: Sed Rate: 37 mm/hr — ABNORMAL HIGH (ref 0–22)

## 2014-09-30 LAB — IBC PANEL
Iron: 87 ug/dL (ref 42–165)
SATURATION RATIOS: 28.6 % (ref 20.0–50.0)
TRANSFERRIN: 217 mg/dL (ref 212.0–360.0)

## 2014-09-30 MED ORDER — MUPIROCIN 2 % EX OINT: TOPICAL_OINTMENT | Freq: Every day | CUTANEOUS | Status: DC

## 2014-09-30 NOTE — Progress Notes (Signed)
Subjective:  Patient ID: Marvin Morrison, male    DOB: Apr 19, 1940  Age: 74 y.o. MRN: [id number]  CC: No chief complaint on file.   HPI Marvin Morrison presents for weakness, diarrhea, wt loss f/u --- resolved. F/u GERD, HTN  Outpatient Prescriptions Prior to Visit  Medication Sig Dispense Refill  . acyclovir (ZOVIRAX) 400 MG tablet TAKE 1 TABLET BY MOUTH TWICE DAILY 60 tablet 3  . cholecalciferol (VITAMIN D) 1000 UNITS tablet Take 1 tablet (1,000 Units total) by mouth daily. 100 tablet 3  . ferrous sulfate 325 (65 FE) MG tablet TAKE 1 TABLET (325 MG TOTAL) BY MOUTH DAILY. 30 tablet 5  . ondansetron (ZOFRAN) 4 MG tablet Take 1 tablet (4 mg total) by mouth every 8 (eight) hours as needed for nausea or vomiting. 20 tablet 1  . pantoprazole (PROTONIX) 40 MG tablet TAKE 1 TABLET BY MOUTH EVERY DAY 30 tablet 3  . Saw Palmetto, Serenoa repens, (SAW PALMETTO PO) Take by mouth daily.    Marland Kitchen triamcinolone ointment (KENALOG) 0.1 %   5  . verapamil (CALAN-SR) 240 MG CR tablet TAKE 1 TABLET BY MOUTH AT BEDTIME 30 tablet 11   No facility-administered medications prior to visit.    ROS Review of Systems  Constitutional: Negative for appetite change, fatigue and unexpected weight change.  HENT: Negative for congestion, nosebleeds, sneezing, sore throat and trouble swallowing.   Eyes: Negative for itching and visual disturbance.  Respiratory: Negative for cough.   Cardiovascular: Negative for chest pain, palpitations and leg swelling.  Gastrointestinal: Negative for nausea, diarrhea, blood in stool and abdominal distention.  Genitourinary: Negative for frequency and hematuria.  Musculoskeletal: Negative for back pain, joint swelling, gait problem and neck pain.  Skin: Negative for rash.  Neurological: Negative for dizziness, tremors, speech difficulty and weakness.  Psychiatric/Behavioral: Negative for sleep disturbance, dysphoric mood and agitation. The patient is not nervous/anxious.      Objective:  BP 160/70 mmHg  Pulse 83  Wt 184 lb (83.462 kg)  SpO2 94%  BP Readings from Last 3 Encounters:  09/30/14 160/70  05/28/14 160/90  04/25/14 92/60    Wt Readings from Last 3 Encounters:  09/30/14 184 lb (83.462 kg)  05/28/14 175 lb (79.379 kg)  04/25/14 151 lb (68.493 kg)    Physical Exam  Constitutional: He is oriented to person, place, and time. He appears well-developed. No distress.  NAD  HENT:  Mouth/Throat: Oropharynx is clear and moist.  Eyes: Conjunctivae are normal. Pupils are equal, round, and reactive to light.  Neck: Normal range of motion. No JVD present. No thyromegaly present.  Cardiovascular: Normal rate, regular rhythm, normal heart sounds and intact distal pulses.  Exam reveals no gallop and no friction rub.   No murmur heard. Pulmonary/Chest: Effort normal and breath sounds normal. No respiratory distress. He has no wheezes. He has no rales. He exhibits no tenderness.  Abdominal: Soft. Bowel sounds are normal. He exhibits no distension and no mass. There is no tenderness. There is no rebound and no guarding.  Musculoskeletal: Normal range of motion. He exhibits no edema or tenderness.  Lymphadenopathy:    He has no cervical adenopathy.  Neurological: He is alert and oriented to person, place, and time. He has normal reflexes. No cranial nerve deficit. He exhibits normal muscle tone. He displays a negative Romberg sign. Coordination and gait normal.  Skin: Skin is warm and dry. Rash noted.  Psychiatric: He has a normal mood and affect. His behavior is  normal. Judgment and thought content normal.    Lab Results  Component Value Date   WBC 10.3 05/28/2014   HGB 8.9* 05/28/2014   HCT 26.5 Repeated and verified X2.* 05/28/2014   PLT 193.0 05/28/2014   GLUCOSE 113* 05/28/2014   CHOL 154 12/24/2013   TRIG 287.0* 12/24/2013   HDL 18.10* 12/24/2013   LDLDIRECT 92.1 12/24/2013   LDLCALC 102* 06/13/2013   ALT 29 04/25/2014   AST 18 04/25/2014    NA 137 05/28/2014   K 4.5 05/28/2014   CL 102 05/28/2014   CREATININE 1.83* 05/28/2014   BUN 27* 05/28/2014   CO2 28 05/28/2014   TSH 0.45 04/18/2014   PSA 0.58 12/24/2013   HGBA1C 5.8 04/18/2014    US Abdomen Complete  04/25/2014   CLINICAL DATA:  Nausea and vomiting.  EXAM: ULTRASOUND ABDOMEN COMPLETE  COMPARISON:  None.  FINDINGS: Gallbladder: No gallstones or wall thickening visualized. No sonographic Murphy sign noted.  Common bile duct: Diameter: 2 mm.  Liver: No focal lesion identified. Within normal limits in parenchymal echogenicity.  IVC: No abnormality visualized.  Pancreas: Visualized portion unremarkable.  Spleen: Size and appearance within normal limits.  Right Kidney: Length: 12.8 cm. Lobular renal contour. This could be from fetal lobulation or scarring. Echogenicity within normal limits. No hydronephrosis visualized. Tiny 8 mm hypoechoic region with increased through transmission right kidney. Most likely tiny cyst.  Left Kidney: Length: 11.8 cm. Lobular renal contour. This could be from fetal lobulation or scarring. Echogenicity within normal limits. No mass or hydronephrosis visualized.  Abdominal aorta: No aneurysm visualized.  Other findings: None.  IMPRESSION: No acute abnormality identified. No evidence of gallstones or biliary distention.   Electronically Signed   By: Marcello Moores  Register   On: 04/25/2014 10:38    Assessment & Plan:   There are no diagnoses linked to this encounter. I am having Mr. Gadsden maintain his triamcinolone ointment, (Saw Palmetto, Serenoa repens, (SAW PALMETTO PO)), cholecalciferol, pantoprazole, verapamil, ondansetron, ferrous sulfate, and acyclovir.  No orders of the defined types were placed in this encounter.     Follow-up: No Follow-up on file.  Walker Kehr, MD

## 2014-09-30 NOTE — Assessment & Plan Note (Signed)
Resolved, however he has some in am - taking Zofran

## 2014-09-30 NOTE — Assessment & Plan Note (Signed)
On Verapamil 

## 2014-09-30 NOTE — Assessment & Plan Note (Signed)
Resolved in 2016

## 2014-09-30 NOTE — Assessment & Plan Note (Signed)
Recheck labs 

## 2014-09-30 NOTE — Assessment & Plan Note (Signed)
Resolved

## 2014-09-30 NOTE — Assessment & Plan Note (Signed)
Labs

## 2014-09-30 NOTE — Progress Notes (Signed)
Pre visit review using our clinic review tool, if applicable. No additional management support is needed unless otherwise documented below in the visit note. 

## 2014-10-02 ENCOUNTER — Other Ambulatory Visit: Payer: Self-pay | Admitting: Internal Medicine

## 2014-11-13 ENCOUNTER — Other Ambulatory Visit: Payer: Self-pay | Admitting: *Deleted

## 2014-11-13 MED ORDER — ONDANSETRON HCL 4 MG PO TABS: 4.0000 mg | ORAL_TABLET | Freq: Three times a day (TID) | ORAL | Status: DC | PRN

## 2014-11-17 NOTE — Telephone Encounter (Signed)
A user error has taken place.

## 2014-11-26 ENCOUNTER — Other Ambulatory Visit: Payer: Self-pay | Admitting: Internal Medicine

## 2014-12-14 ENCOUNTER — Other Ambulatory Visit: Payer: Self-pay | Admitting: Internal Medicine

## 2014-12-15 NOTE — Telephone Encounter (Signed)
Please advise, thanks.

## 2015-01-12 ENCOUNTER — Other Ambulatory Visit: Payer: Self-pay | Admitting: Internal Medicine

## 2015-01-27 ENCOUNTER — Other Ambulatory Visit: Payer: Self-pay | Admitting: Internal Medicine

## 2015-02-25 ENCOUNTER — Other Ambulatory Visit: Payer: Self-pay | Admitting: Internal Medicine

## 2015-03-09 ENCOUNTER — Other Ambulatory Visit: Payer: Self-pay | Admitting: Internal Medicine

## 2015-03-09 NOTE — Telephone Encounter (Signed)
Please advise in dr plotnikov's absence---thanks

## 2015-03-24 ENCOUNTER — Other Ambulatory Visit: Payer: Self-pay | Admitting: Internal Medicine

## 2015-03-26 ENCOUNTER — Other Ambulatory Visit: Payer: Self-pay | Admitting: Internal Medicine

## 2015-03-26 NOTE — Telephone Encounter (Signed)
Please advise, thanks.

## 2015-03-31 ENCOUNTER — Encounter: Payer: Self-pay | Admitting: Internal Medicine

## 2015-03-31 ENCOUNTER — Other Ambulatory Visit (INDEPENDENT_AMBULATORY_CARE_PROVIDER_SITE_OTHER): Payer: Medicare Other

## 2015-03-31 ENCOUNTER — Ambulatory Visit (INDEPENDENT_AMBULATORY_CARE_PROVIDER_SITE_OTHER): Payer: Medicare Other | Admitting: Internal Medicine

## 2015-03-31 VITALS — BP 160/92 | HR 83 | Wt 180.0 lb

## 2015-03-31 DIAGNOSIS — R112 Nausea with vomiting, unspecified: Secondary | ICD-10-CM

## 2015-03-31 DIAGNOSIS — I10 Essential (primary) hypertension: Secondary | ICD-10-CM

## 2015-03-31 DIAGNOSIS — R7309 Other abnormal glucose: Secondary | ICD-10-CM

## 2015-03-31 DIAGNOSIS — N183 Chronic kidney disease, stage 3 unspecified: Secondary | ICD-10-CM

## 2015-03-31 DIAGNOSIS — K591 Functional diarrhea: Secondary | ICD-10-CM

## 2015-03-31 DIAGNOSIS — D509 Iron deficiency anemia, unspecified: Secondary | ICD-10-CM

## 2015-03-31 LAB — HEPATIC FUNCTION PANEL
ALBUMIN: 4.3 g/dL (ref 3.5–5.2)
ALT: 16 U/L (ref 0–53)
AST: 18 U/L (ref 0–37)
Alkaline Phosphatase: 77 U/L (ref 39–117)
BILIRUBIN TOTAL: 0.3 mg/dL (ref 0.2–1.2)
Bilirubin, Direct: 0.1 mg/dL (ref 0.0–0.3)
Total Protein: 6.9 g/dL (ref 6.0–8.3)

## 2015-03-31 LAB — BASIC METABOLIC PANEL
BUN: 25 mg/dL — AB (ref 6–23)
CALCIUM: 9.1 mg/dL (ref 8.4–10.5)
CO2: 26 mEq/L (ref 19–32)
CREATININE: 1.72 mg/dL — AB (ref 0.40–1.50)
Chloride: 103 mEq/L (ref 96–112)
GFR: 41.24 mL/min — AB (ref >60.00)
GLUCOSE: 115 mg/dL — AB (ref 70–99)
POTASSIUM: 4.1 meq/L (ref 3.5–5.1)
Sodium: 139 mEq/L (ref 135–145)

## 2015-03-31 LAB — CBC WITH DIFFERENTIAL/PLATELET
BASOS ABS: 0 10*3/uL (ref 0.0–0.1)
BASOS PCT: 0.5 % (ref 0.0–3.0)
EOS PCT: 2.1 % (ref 0.0–5.0)
Eosinophils Absolute: 0.2 10*3/uL (ref 0.0–0.7)
HEMATOCRIT: 34.3 % — AB (ref 39.0–52.0)
Hemoglobin: 11.8 g/dL — ABNORMAL LOW (ref 13.0–17.0)
LYMPHS PCT: 15.7 % (ref 12.0–46.0)
Lymphs Abs: 1.4 10*3/uL (ref 0.7–4.0)
MCHC: 34.3 g/dL (ref 30.0–36.0)
MCV: 95.6 fl (ref 78.0–100.0)
MONOS PCT: 9.5 % (ref 3.0–12.0)
Monocytes Absolute: 0.8 10*3/uL (ref 0.1–1.0)
NEUTROS ABS: 6.4 10*3/uL (ref 1.4–7.7)
Neutrophils Relative %: 72.2 % (ref 43.0–77.0)
PLATELETS: 169 10*3/uL (ref 150.0–400.0)
RBC: 3.59 Mil/uL — ABNORMAL LOW (ref 4.22–5.81)
RDW: 13.4 % (ref 11.5–15.5)
WBC: 8.8 10*3/uL (ref 4.0–10.5)

## 2015-03-31 LAB — IBC PANEL
IRON: 77 ug/dL (ref 42–165)
SATURATION RATIOS: 24.8 % (ref 20.0–50.0)
TRANSFERRIN: 222 mg/dL (ref 212.0–360.0)

## 2015-03-31 MED ORDER — ONDANSETRON HCL 4 MG PO TABS: 4.0000 mg | ORAL_TABLET | Freq: Three times a day (TID) | ORAL | Status: DC | PRN

## 2015-03-31 MED ORDER — ACYCLOVIR 400 MG PO TABS: 400.0000 mg | ORAL_TABLET | Freq: Two times a day (BID) | ORAL | Status: DC

## 2015-03-31 NOTE — Assessment & Plan Note (Signed)
On Verapamil 

## 2015-03-31 NOTE — Assessment & Plan Note (Signed)
Protonix.  ?

## 2015-03-31 NOTE — Progress Notes (Signed)
Subjective:  Patient ID: Marvin Morrison, male    DOB: 08/06/1940  Age: 75 y.o. MRN: [id number]  CC: No chief complaint on file.   HPI Marvin Morrison presents for 6 mo f/u - diarrhea, rash, GERD f/u  Outpatient Prescriptions Prior to Visit  Medication Sig Dispense Refill  . cholecalciferol (VITAMIN D) 1000 UNITS tablet Take 1 tablet (1,000 Units total) by mouth daily. 100 tablet 3  . ferrous sulfate 325 (65 FE) MG tablet TAKE 1 TABLET (325 MG TOTAL) BY MOUTH DAILY. 30 tablet 5  . mupirocin ointment (BACTROBAN) 2 % Apply topically daily. 30 g 3  . pantoprazole (PROTONIX) 40 MG tablet TAKE 1 TABLET BY MOUTH EVERY DAY 30 tablet 3  . Saw Palmetto, Serenoa repens, (SAW PALMETTO PO) Take by mouth daily.    Marland Kitchen triamcinolone ointment (KENALOG) 0.1 % APPLY TO AFFECTED AREA TWICE DAILY 80 g 2  . verapamil (CALAN-SR) 240 MG CR tablet TAKE 1 TABLET BY MOUTH AT BEDTIME 30 tablet 11  . acyclovir (ZOVIRAX) 400 MG tablet TAKE 1 TABLET BY MOUTH TWICE DAILY 60 tablet 3  . ondansetron (ZOFRAN) 4 MG tablet TAKE 1 TABLET BY MOUTH EVERY 8 HOURS AS NEEDED FOR NAUSEA AND VOMITING 20 tablet 0   No facility-administered medications prior to visit.    ROS Review of Systems  Constitutional: Negative for appetite change, fatigue and unexpected weight change.  HENT: Negative for congestion, nosebleeds, sneezing, sore throat and trouble swallowing.   Eyes: Negative for itching and visual disturbance.  Respiratory: Negative for cough.   Cardiovascular: Negative for chest pain, palpitations and leg swelling.  Gastrointestinal: Negative for nausea, diarrhea, blood in stool and abdominal distention.  Genitourinary: Negative for frequency and hematuria.  Musculoskeletal: Negative for back pain, joint swelling, gait problem and neck pain.  Skin: Negative for rash.  Neurological: Negative for dizziness, tremors, speech difficulty and weakness.  Psychiatric/Behavioral: Negative for suicidal ideas, sleep disturbance,  dysphoric mood and agitation. The patient is not nervous/anxious.     Objective:  BP 160/92 mmHg  Pulse 83  Wt 180 lb (81.647 kg)  SpO2 95%  BP Readings from Last 3 Encounters:  03/31/15 160/92  09/30/14 160/70  05/28/14 160/90    Wt Readings from Last 3 Encounters:  03/31/15 180 lb (81.647 kg)  09/30/14 184 lb (83.462 kg)  05/28/14 175 lb (79.379 kg)    Physical Exam  Constitutional: He is oriented to person, place, and time. He appears well-developed. No distress.  NAD  HENT:  Mouth/Throat: Oropharynx is clear and moist.  Eyes: Conjunctivae are normal. Pupils are equal, round, and reactive to light.  Neck: Normal range of motion. No JVD present. No thyromegaly present.  Cardiovascular: Normal rate, regular rhythm, normal heart sounds and intact distal pulses.  Exam reveals no gallop and no friction rub.   No murmur heard. Pulmonary/Chest: Effort normal and breath sounds normal. No respiratory distress. He has no wheezes. He has no rales. He exhibits no tenderness.  Abdominal: Soft. Bowel sounds are normal. He exhibits no distension and no mass. There is no tenderness. There is no rebound and no guarding.  Musculoskeletal: Normal range of motion. He exhibits no edema or tenderness.  Lymphadenopathy:    He has no cervical adenopathy.  Neurological: He is alert and oriented to person, place, and time. He has normal reflexes. No cranial nerve deficit. He exhibits normal muscle tone. He displays a negative Romberg sign. Coordination and gait normal.  Skin: Skin is warm and  dry. No rash noted.  Psychiatric: He has a normal mood and affect. His behavior is normal. Judgment and thought content normal.    Lab Results  Component Value Date   WBC 9.4 09/30/2014   HGB 11.4* 09/30/2014   HCT 34.0* 09/30/2014   PLT 183.0 09/30/2014   GLUCOSE 113* 09/30/2014   CHOL 154 12/24/2013   TRIG 287.0* 12/24/2013   HDL 18.10* 12/24/2013   LDLDIRECT 92.1 12/24/2013   LDLCALC 102*  06/13/2013   ALT 24 09/30/2014   AST 20 09/30/2014   NA 136 09/30/2014   K 4.0 09/30/2014   CL 100 09/30/2014   CREATININE 1.89* 09/30/2014   BUN 30* 09/30/2014   CO2 29 09/30/2014   TSH 0.45 04/18/2014   PSA 0.58 12/24/2013   HGBA1C 5.8 04/18/2014    US Abdomen Complete  04/25/2014  CLINICAL DATA:  Nausea and vomiting. EXAM: ULTRASOUND ABDOMEN COMPLETE COMPARISON:  None. FINDINGS: Gallbladder: No gallstones or wall thickening visualized. No sonographic Murphy sign noted. Common bile duct: Diameter: 2 mm. Liver: No focal lesion identified. Within normal limits in parenchymal echogenicity. IVC: No abnormality visualized. Pancreas: Visualized portion unremarkable. Spleen: Size and appearance within normal limits. Right Kidney: Length: 12.8 cm. Lobular renal contour. This could be from fetal lobulation or scarring. Echogenicity within normal limits. No hydronephrosis visualized. Tiny 8 mm hypoechoic region with increased through transmission right kidney. Most likely tiny cyst. Left Kidney: Length: 11.8 cm. Lobular renal contour. This could be from fetal lobulation or scarring. Echogenicity within normal limits. No mass or hydronephrosis visualized. Abdominal aorta: No aneurysm visualized. Other findings: None. IMPRESSION: No acute abnormality identified. No evidence of gallstones or biliary distention. Electronically Signed   By: Marcello Moores  Register   On: 04/25/2014 10:38    Assessment & Plan:   Diagnoses and all orders for this visit:  Essential hypertension -     CBC with Differential/Platelet; Future -     Basic metabolic panel; Future -     Hepatic function panel; Future -     IBC panel; Future  Non-intractable vomiting with nausea, unspecified vomiting type -     CBC with Differential/Platelet; Future -     Basic metabolic panel; Future -     Hepatic function panel; Future -     IBC panel; Future  Functional diarrhea -     CBC with Differential/Platelet; Future -     Basic  metabolic panel; Future -     Hepatic function panel; Future -     IBC panel; Future  ABNORMAL GLUCOSE NEC -     CBC with Differential/Platelet; Future -     Basic metabolic panel; Future -     Hepatic function panel; Future -     IBC panel; Future  CRI (chronic renal insufficiency), stage 3 (moderate) -     CBC with Differential/Platelet; Future -     Basic metabolic panel; Future -     Hepatic function panel; Future -     IBC panel; Future  Anemia, iron deficiency -     CBC with Differential/Platelet; Future -     Basic metabolic panel; Future -     Hepatic function panel; Future -     IBC panel; Future  Other orders -     ondansetron (ZOFRAN) 4 MG tablet; Take 1 tablet (4 mg total) by mouth every 8 (eight) hours as needed for nausea or vomiting. -     acyclovir (ZOVIRAX) 400 MG tablet; Take 1 tablet (  400 mg total) by mouth 2 (two) times daily.  I have changed Mr. Stedman ondansetron and acyclovir. I am also having him maintain his (Saw Palmetto, Serenoa repens, (SAW PALMETTO PO)), cholecalciferol, verapamil, mupirocin ointment, triamcinolone ointment, ferrous sulfate, and pantoprazole.  Meds ordered this encounter  Medications  . ondansetron (ZOFRAN) 4 MG tablet    Sig: Take 1 tablet (4 mg total) by mouth every 8 (eight) hours as needed for nausea or vomiting.    Dispense:  30 tablet    Refill:  5  . acyclovir (ZOVIRAX) 400 MG tablet    Sig: Take 1 tablet (400 mg total) by mouth 2 (two) times daily.    Dispense:  60 tablet    Refill:  11     Follow-up: Return in about 6 months (around 10/01/2015) for Wellness Exam.  Walker Kehr, MD

## 2015-03-31 NOTE — Assessment & Plan Note (Signed)
CBC and iron 

## 2015-03-31 NOTE — Assessment & Plan Note (Signed)
Labs

## 2015-03-31 NOTE — Progress Notes (Signed)
Pre visit review using our clinic review tool, if applicable. No additional management support is needed unless otherwise documented below in the visit note. 

## 2015-03-31 NOTE — Assessment & Plan Note (Signed)
zofran prn

## 2015-06-19 ENCOUNTER — Other Ambulatory Visit: Payer: Self-pay | Admitting: *Deleted

## 2015-07-15 ENCOUNTER — Other Ambulatory Visit: Payer: Self-pay | Admitting: *Deleted

## 2015-07-15 MED ORDER — PANTOPRAZOLE SODIUM 40 MG PO TBEC: 40.0000 mg | DELAYED_RELEASE_TABLET | Freq: Every day | ORAL | Status: DC

## 2015-07-15 MED ORDER — VERAPAMIL HCL ER 240 MG PO TBCR: 240.0000 mg | EXTENDED_RELEASE_TABLET | Freq: Every day | ORAL | Status: DC

## 2015-09-02 ENCOUNTER — Other Ambulatory Visit: Payer: Self-pay | Admitting: Internal Medicine

## 2015-10-02 ENCOUNTER — Other Ambulatory Visit: Payer: Self-pay | Admitting: Internal Medicine

## 2015-10-21 ENCOUNTER — Ambulatory Visit: Payer: Medicare Other | Admitting: Internal Medicine

## 2015-10-28 ENCOUNTER — Ambulatory Visit (INDEPENDENT_AMBULATORY_CARE_PROVIDER_SITE_OTHER): Payer: Medicare Other | Admitting: Internal Medicine

## 2015-10-28 ENCOUNTER — Ambulatory Visit (INDEPENDENT_AMBULATORY_CARE_PROVIDER_SITE_OTHER)
Admission: RE | Admit: 2015-10-28 | Discharge: 2015-10-28 | Disposition: A | Discharge status: Home / Self Care | Payer: Medicare Other | Source: Ambulatory Visit | Attending: Internal Medicine | Admitting: Internal Medicine

## 2015-10-28 ENCOUNTER — Encounter: Payer: Self-pay | Admitting: Internal Medicine

## 2015-10-28 VITALS — BP 158/92 | HR 92 | Wt 172.0 lb

## 2015-10-28 DIAGNOSIS — M48062 Spinal stenosis, lumbar region with neurogenic claudication: Secondary | ICD-10-CM

## 2015-10-28 DIAGNOSIS — R531 Weakness: Secondary | ICD-10-CM

## 2015-10-28 DIAGNOSIS — M545 Low back pain: Secondary | ICD-10-CM | POA: Diagnosis not present

## 2015-10-28 DIAGNOSIS — M48061 Spinal stenosis, lumbar region without neurogenic claudication: Secondary | ICD-10-CM | POA: Insufficient documentation

## 2015-10-28 MED ORDER — PREDNISONE 10 MG PO TABS: ORAL_TABLET | ORAL | 1 refills | Status: DC

## 2015-10-28 NOTE — Assessment & Plan Note (Signed)
LEs 10/17 ?spinal stenosis

## 2015-10-28 NOTE — Progress Notes (Signed)
Subjective:  Patient ID: Marvin Morrison, male    DOB: [birth year]  Age: 75 y.o. MRN: [id number]  CC: No chief complaint on file.   HPI Marvin Morrison presents for shooting pain down B legs w/o arterial claudication sx's x 2 mo. C/o "heat flushes" in B inner thighs x 2-3 min. C/o numbness in buttocks, and thighs. Pain in B legs is relieved w/bending over  Outpatient Medications Prior to Visit  Medication Sig Dispense Refill  . acyclovir (ZOVIRAX) 400 MG tablet Take 1 tablet (400 mg total) by mouth 2 (two) times daily. 60 tablet 11  . ferrous sulfate 325 (65 FE) MG tablet TAKE 1 TABLET BY MOUTH EVERY DAY 30 tablet 5  . mupirocin ointment (BACTROBAN) 2 % APPLY TOPICALLY DAILY. 22 g 2  . ondansetron (ZOFRAN) 4 MG tablet TAKE 1 TABLET BY MOUTH EVERY 8 HOURS AS NEEDED FOR NAUSEA AND VOMITING 30 tablet 5  . pantoprazole (PROTONIX) 40 MG tablet Take 1 tablet (40 mg total) by mouth daily. 90 tablet 2  . Saw Palmetto, Serenoa repens, (SAW PALMETTO PO) Take by mouth daily.    Marland Kitchen triamcinolone ointment (KENALOG) 0.1 % APPLY TO AFFECTED AREA TWICE DAILY 80 g 2  . verapamil (CALAN-SR) 240 MG CR tablet Take 1 tablet (240 mg total) by mouth at bedtime. 90 tablet 2   No facility-administered medications prior to visit.     ROS Review of Systems  Constitutional: Negative for appetite change, fatigue and unexpected weight change.  HENT: Negative for congestion, nosebleeds, sneezing, sore throat and trouble swallowing.   Eyes: Negative for itching and visual disturbance.  Respiratory: Negative for cough.   Cardiovascular: Negative for chest pain, palpitations and leg swelling.  Gastrointestinal: Negative for abdominal distention, blood in stool, diarrhea and nausea.  Genitourinary: Negative for frequency and hematuria.  Musculoskeletal: Positive for arthralgias and gait problem. Negative for back pain, joint swelling and neck pain.  Skin: Negative for rash.  Neurological: Negative for dizziness, tremors,  speech difficulty and weakness.  Psychiatric/Behavioral: Negative for agitation, dysphoric mood and sleep disturbance. The patient is not nervous/anxious.     Objective:  BP (!) 158/92   Pulse 92   Wt 172 lb (78 kg)   SpO2 93%   BMI 23.33 kg/m   BP Readings from Last 3 Encounters:  10/28/15 (!) 158/92  03/31/15 (!) 160/92  09/30/14 (!) 160/70    Wt Readings from Last 3 Encounters:  10/28/15 172 lb (78 kg)  03/31/15 180 lb (81.6 kg)  09/30/14 184 lb (83.5 kg)    Physical Exam  Constitutional: He is oriented to person, place, and time. He appears well-developed. No distress.  NAD  HENT:  Mouth/Throat: Oropharynx is clear and moist.  Eyes: Conjunctivae are normal. Pupils are equal, round, and reactive to light.  Neck: Normal range of motion. No JVD present. No thyromegaly present.  Cardiovascular: Normal rate, regular rhythm, normal heart sounds and intact distal pulses.  Exam reveals no gallop and no friction rub.   No murmur heard. Pulmonary/Chest: Effort normal and breath sounds normal. No respiratory distress. He has no wheezes. He has no rales. He exhibits no tenderness.  Abdominal: Soft. Bowel sounds are normal. He exhibits no distension and no mass. There is no tenderness. There is no rebound and no guarding.  Musculoskeletal: Normal range of motion. He exhibits tenderness. He exhibits no edema.  Lymphadenopathy:    He has no cervical adenopathy.  Neurological: He is alert and oriented to person,  place, and time. He has normal reflexes. No cranial nerve deficit. He exhibits normal muscle tone. He displays a negative Romberg sign. Coordination and gait normal.  Skin: Skin is warm and dry. No rash noted.  Psychiatric: He has a normal mood and affect. His behavior is normal. Judgment and thought content normal.  LE pulses ok Thin calves No hair on LEs B LS tender w/decreased ROM  Lab Results  Component Value Date   WBC 8.8 03/31/2015   HGB 11.8 (L) 03/31/2015   HCT  34.3 (L) 03/31/2015   PLT 169.0 03/31/2015   GLUCOSE 115 (H) 03/31/2015   CHOL 154 12/24/2013   TRIG 287.0 (H) 12/24/2013   HDL 18.10 (L) 12/24/2013   LDLDIRECT 92.1 12/24/2013   LDLCALC 102 (H) 06/13/2013   ALT 16 03/31/2015   AST 18 03/31/2015   NA 139 03/31/2015   K 4.1 03/31/2015   CL 103 03/31/2015   CREATININE 1.72 (H) 03/31/2015   BUN 25 (H) 03/31/2015   CO2 26 03/31/2015   TSH 0.45 04/18/2014   PSA 0.58 12/24/2013   HGBA1C 5.8 04/18/2014    US Abdomen Complete  Result Date: 04/25/2014 CLINICAL DATA:  Nausea and vomiting. EXAM: ULTRASOUND ABDOMEN COMPLETE COMPARISON:  None. FINDINGS: Gallbladder: No gallstones or wall thickening visualized. No sonographic Murphy sign noted. Common bile duct: Diameter: 2 mm. Liver: No focal lesion identified. Within normal limits in parenchymal echogenicity. IVC: No abnormality visualized. Pancreas: Visualized portion unremarkable. Spleen: Size and appearance within normal limits. Right Kidney: Length: 12.8 cm. Lobular renal contour. This could be from fetal lobulation or scarring. Echogenicity within normal limits. No hydronephrosis visualized. Tiny 8 mm hypoechoic region with increased through transmission right kidney. Most likely tiny cyst. Left Kidney: Length: 11.8 cm. Lobular renal contour. This could be from fetal lobulation or scarring. Echogenicity within normal limits. No mass or hydronephrosis visualized. Abdominal aorta: No aneurysm visualized. Other findings: None. IMPRESSION: No acute abnormality identified. No evidence of gallstones or biliary distention. Electronically Signed   By: Marcello Moores  Register   On: 04/25/2014 10:38    Assessment & Plan:   There are no diagnoses linked to this encounter. I am having Mr. Salee maintain his (Saw Palmetto, Serenoa repens, (SAW PALMETTO PO)), triamcinolone ointment, acyclovir, verapamil, pantoprazole, ferrous sulfate, mupirocin ointment, and ondansetron.  No orders of the defined types were  placed in this encounter.    Follow-up: No Follow-up on file.  Walker Kehr, MD

## 2015-10-28 NOTE — Assessment & Plan Note (Addendum)
10/17 Prednisone 10 mg: take 4 tabs a day x 3 days; then 3 tabs a day x 4 days; then 2 tabs a day x 4 days, then 1 tab a day x 6 days, then stop. Take pc. X ray Labs MRI if needed  RTC 2 wks

## 2015-10-28 NOTE — Progress Notes (Signed)
Pre visit review using our clinic review tool, if applicable. No additional management support is needed unless otherwise documented below in the visit note. 

## 2015-11-10 ENCOUNTER — Telehealth: Payer: Self-pay | Admitting: Internal Medicine

## 2015-11-10 DIAGNOSIS — M48062 Spinal stenosis, lumbar region with neurogenic claudication: Secondary | ICD-10-CM

## 2015-11-10 NOTE — Telephone Encounter (Signed)
Patient is calling to advise that he is unable to sleep because of the arthritis pain in his legs. 10/10.   He is asking that [facility] on [facility] and call him to advise we did so. He states that he is bed-ridden

## 2015-11-11 MED ORDER — TRAMADOL HCL 50 MG PO TABS: 50.0000 mg | ORAL_TABLET | Freq: Three times a day (TID) | ORAL | 0 refills | Status: DC | PRN

## 2015-11-11 NOTE — Telephone Encounter (Signed)
Tramadol - pls call in I'll sch MRI Go to ER if worse Thx

## 2015-11-11 NOTE — Telephone Encounter (Signed)
Rx phoned in. Pt informed . He seems to be doing better today and he slept well lastnight.

## 2015-11-18 ENCOUNTER — Encounter: Payer: Self-pay | Admitting: Internal Medicine

## 2015-11-18 ENCOUNTER — Ambulatory Visit (INDEPENDENT_AMBULATORY_CARE_PROVIDER_SITE_OTHER): Payer: Medicare Other | Admitting: Internal Medicine

## 2015-11-18 DIAGNOSIS — R634 Abnormal weight loss: Secondary | ICD-10-CM | POA: Diagnosis not present

## 2015-11-18 DIAGNOSIS — R269 Unspecified abnormalities of gait and mobility: Secondary | ICD-10-CM | POA: Diagnosis not present

## 2015-11-18 DIAGNOSIS — M48062 Spinal stenosis, lumbar region with neurogenic claudication: Secondary | ICD-10-CM | POA: Diagnosis not present

## 2015-11-18 MED ORDER — PREDNISONE 10 MG PO TABS: ORAL_TABLET | ORAL | 1 refills | Status: DC

## 2015-11-18 MED ORDER — TRAMADOL HCL 50 MG PO TABS: 50.0000 mg | ORAL_TABLET | Freq: Three times a day (TID) | ORAL | 2 refills | Status: DC | PRN

## 2015-11-18 MED ORDER — GABAPENTIN 300 MG PO CAPS: 300.0000 mg | ORAL_CAPSULE | Freq: Three times a day (TID) | ORAL | 3 refills | Status: DC

## 2015-11-18 NOTE — Progress Notes (Signed)
Pre visit review using our clinic review tool, if applicable. No additional management support is needed unless otherwise documented below in the visit note. 

## 2015-11-18 NOTE — Assessment & Plan Note (Addendum)
weakness was better on 40 mg/d Prednisone only; pain is better on Tramadol MRI pending Start higher dose prednisone Cont Tramadol prn Gabapentin at HS  Potential benefits of a long term opioids use as well as potential risks (i.e. addiction risk, apnea etc) and complications (i.e. Somnolence, constipation and others) were explained to the patient and were aknowledged.  Potential benefits of steroid  use as well as potential risks  and complications were explained to the patient and were aknowledged.

## 2015-11-18 NOTE — Assessment & Plan Note (Signed)
Wt Readings from Last 3 Encounters:  11/18/15 170 lb (77.1 kg)  10/28/15 172 lb (78 kg)  03/31/15 180 lb (81.6 kg)

## 2015-11-18 NOTE — Progress Notes (Signed)
Subjective:  Patient ID: Marvin Morrison, male    DOB: 1940/03/15  Age: 75 y.o. MRN: [id number]  CC: No chief complaint on file.   HPI DAGMAWI BALSTER presents for B leg pain and weakness - (weakness was better on 40 mg/d Prednisone only; pain is better on Tramadol)  Outpatient Medications Prior to Visit  Medication Sig Dispense Refill  . acyclovir (ZOVIRAX) 400 MG tablet Take 1 tablet (400 mg total) by mouth 2 (two) times daily. 60 tablet 11  . ferrous sulfate 325 (65 FE) MG tablet TAKE 1 TABLET BY MOUTH EVERY DAY 30 tablet 5  . mupirocin ointment (BACTROBAN) 2 % APPLY TOPICALLY DAILY. 22 g 2  . ondansetron (ZOFRAN) 4 MG tablet TAKE 1 TABLET BY MOUTH EVERY 8 HOURS AS NEEDED FOR NAUSEA AND VOMITING 30 tablet 5  . pantoprazole (PROTONIX) 40 MG tablet Take 1 tablet (40 mg total) by mouth daily. 90 tablet 2  . predniSONE (DELTASONE) 10 MG tablet Prednisone 10 mg: take 4 tabs a day x 3 days; then 3 tabs a day x 4 days; then 2 tabs a day x 4 days, then 1 tab a day x 6 days, then stop. Take pc. 38 tablet 1  . Saw Palmetto, Serenoa repens, (SAW PALMETTO PO) Take by mouth daily.    . traMADol (ULTRAM) 50 MG tablet Take 1-2 tablets (50-100 mg total) by mouth every 8 (eight) hours as needed. 30 tablet 0  . triamcinolone ointment (KENALOG) 0.1 % APPLY TO AFFECTED AREA TWICE DAILY 80 g 2  . verapamil (CALAN-SR) 240 MG CR tablet Take 1 tablet (240 mg total) by mouth at bedtime. 90 tablet 2   No facility-administered medications prior to visit.     ROS Review of Systems  Objective:  BP (!) 188/90   Pulse (!) 113   Wt 170 lb (77.1 kg)   SpO2 97%   BMI 23.06 kg/m   BP Readings from Last 3 Encounters:  11/18/15 (!) 188/90  10/28/15 (!) 158/92  03/31/15 (!) 160/92    Wt Readings from Last 3 Encounters:  11/18/15 170 lb (77.1 kg)  10/28/15 172 lb (78 kg)  03/31/15 180 lb (81.6 kg)    Physical Exam  Lab Results  Component Value Date   WBC 8.8 03/31/2015   HGB 11.8 (L) 03/31/2015   HCT  34.3 (L) 03/31/2015   PLT 169.0 03/31/2015   GLUCOSE 115 (H) 03/31/2015   CHOL 154 12/24/2013   TRIG 287.0 (H) 12/24/2013   HDL 18.10 (L) 12/24/2013   LDLDIRECT 92.1 12/24/2013   LDLCALC 102 (H) 06/13/2013   ALT 16 03/31/2015   AST 18 03/31/2015   NA 139 03/31/2015   K 4.1 03/31/2015   CL 103 03/31/2015   CREATININE 1.72 (H) 03/31/2015   BUN 25 (H) 03/31/2015   CO2 26 03/31/2015   TSH 0.45 04/18/2014   PSA 0.58 12/24/2013   HGBA1C 5.8 04/18/2014    Dg Lumbar Spine 2-3 Views  Result Date: 10/28/2015 CLINICAL DATA:  Low back pain EXAM: LUMBAR SPINE - 2-3 VIEW COMPARISON:  None. FINDINGS: Three views of the lumbar spine submitted. No acute fracture or subluxation. Significant disc space flattening with vacuum disc phenomenon endplate sclerotic changes and anterior spurring at L2-L3 and L3-L4 level. Moderate disc space flattening with vacuum disc phenomenon at L4-L5 and L5-S1 level. Facet degenerative changes noted L3-L4 and L5 level. Atherosclerotic calcifications of abdominal aorta. IMPRESSION: No acute fracture or subluxation. Osteoarthritic changes as described above. Electronically  Signed   By: Lahoma Crocker M.D.   On: 10/28/2015 14:34    Assessment & Plan:   There are no diagnoses linked to this encounter. I am having Mr. Reaume maintain his (Saw Palmetto, Serenoa repens, (SAW PALMETTO PO)), triamcinolone ointment, acyclovir, verapamil, pantoprazole, ferrous sulfate, mupirocin ointment, ondansetron, predniSONE, and traMADol.  No orders of the defined types were placed in this encounter.    Follow-up: No Follow-up on file.  Walker Kehr, MD

## 2015-11-28 ENCOUNTER — Ambulatory Visit
Admission: RE | Admit: 2015-11-28 | Discharge: 2015-11-28 | Disposition: A | Discharge status: Home / Self Care | Payer: Medicare Other | Source: Ambulatory Visit | Attending: Internal Medicine | Admitting: Internal Medicine

## 2015-11-28 DIAGNOSIS — M48062 Spinal stenosis, lumbar region with neurogenic claudication: Secondary | ICD-10-CM

## 2015-11-28 DIAGNOSIS — M48061 Spinal stenosis, lumbar region without neurogenic claudication: Secondary | ICD-10-CM | POA: Diagnosis not present

## 2015-11-30 ENCOUNTER — Other Ambulatory Visit: Payer: Self-pay | Admitting: Internal Medicine

## 2015-11-30 ENCOUNTER — Telehealth: Payer: Self-pay | Admitting: Internal Medicine

## 2015-11-30 DIAGNOSIS — M48062 Spinal stenosis, lumbar region with neurogenic claudication: Secondary | ICD-10-CM

## 2015-11-30 NOTE — Telephone Encounter (Signed)
Pt informed of MRI report NS cons Thx

## 2015-12-08 ENCOUNTER — Ambulatory Visit (INDEPENDENT_AMBULATORY_CARE_PROVIDER_SITE_OTHER): Payer: Medicare Other | Admitting: Internal Medicine

## 2015-12-08 ENCOUNTER — Other Ambulatory Visit (INDEPENDENT_AMBULATORY_CARE_PROVIDER_SITE_OTHER): Payer: Medicare Other

## 2015-12-08 ENCOUNTER — Ambulatory Visit: Payer: Medicare Other | Admitting: Internal Medicine

## 2015-12-08 ENCOUNTER — Encounter: Payer: Self-pay | Admitting: Internal Medicine

## 2015-12-08 DIAGNOSIS — R269 Unspecified abnormalities of gait and mobility: Secondary | ICD-10-CM | POA: Diagnosis not present

## 2015-12-08 DIAGNOSIS — M48062 Spinal stenosis, lumbar region with neurogenic claudication: Secondary | ICD-10-CM | POA: Diagnosis not present

## 2015-12-08 DIAGNOSIS — N184 Chronic kidney disease, stage 4 (severe): Secondary | ICD-10-CM | POA: Diagnosis not present

## 2015-12-08 LAB — HEPATIC FUNCTION PANEL
ALBUMIN: 3.9 g/dL (ref 3.5–5.2)
ALT: 19 U/L (ref 0–53)
AST: 11 U/L (ref 0–37)
Alkaline Phosphatase: 64 U/L (ref 39–117)
Bilirubin, Direct: 0.1 mg/dL (ref 0.0–0.3)
TOTAL PROTEIN: 6.4 g/dL (ref 6.0–8.3)
Total Bilirubin: 0.4 mg/dL (ref 0.2–1.2)

## 2015-12-08 LAB — BASIC METABOLIC PANEL
BUN: 36 mg/dL — AB (ref 6–23)
CHLORIDE: 101 meq/L (ref 96–112)
CO2: 19 meq/L (ref 19–32)
CREATININE: 2.07 mg/dL — AB (ref 0.40–1.50)
Calcium: 9.2 mg/dL (ref 8.4–10.5)
GFR: 33.24 mL/min — ABNORMAL LOW (ref >60.00)
GLUCOSE: 143 mg/dL — AB (ref 70–99)
Potassium: 4.4 mEq/L (ref 3.5–5.1)
Sodium: 135 mEq/L (ref 135–145)

## 2015-12-08 MED ORDER — PREDNISONE 20 MG PO TABS: 40.0000 mg | ORAL_TABLET | Freq: Every day | ORAL | 1 refills | Status: DC

## 2015-12-08 NOTE — Assessment & Plan Note (Addendum)
MRI LS spine:  1. 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 This also results in severe bilateral foraminal stenosis. 2. Left lateral recess narrowing at L5-S1 secondary to severe bilateral facet arthrosis and subarticular disc protrusion on the left. Correlate for left S1 radiculopathy. 3. Moderate spinal canal stenosis and moderate to severe bilateral foraminal stenosis at L2-L3 and L3-L4.   Electronically Signed   By: Ulyses Jarred M.D.   On: 11/28/2015 19:36  Dr Ellene Route in early Jan Predn 40-60 mg qd  Potential benefits of higer dose steroid use as well as potential risks  and complications were explained to the patient and were aknowledged. To ER if worse,incontinence sx's etc Labs

## 2015-12-08 NOTE — Assessment & Plan Note (Signed)
MRI LS spine:  1. 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 This also results in severe bilateral foraminal stenosis. 2. Left lateral recess narrowing at L5-S1 secondary to severe bilateral facet arthrosis and subarticular disc protrusion on the left. Correlate for left S1 radiculopathy. 3. Moderate spinal canal stenosis and moderate to severe bilateral foraminal stenosis at L2-L3 and L3-L4.   Electronically Signed   By: Ulyses Jarred M.D.   On: 11/28/2015 19:36  Tramadol, Prednisone To ER if worse

## 2015-12-08 NOTE — Assessment & Plan Note (Signed)
Labs today

## 2015-12-08 NOTE — Progress Notes (Signed)
Subjective:  Patient ID: ATO CRONEN, male    DOB: [birth year]  Age: 75 y.o. MRN: [id number]  CC: No chief complaint on file.   HPI Marvin Morrison presents for leg weakness and LBP. Marvin Morrison is doing well on 40-60 mg of prednisone a day. Marvin Morrison is not well on lower doses...  Outpatient Medications Prior to Visit  Medication Sig Dispense Refill  . acyclovir (ZOVIRAX) 400 MG tablet Take 1 tablet (400 mg total) by mouth 2 (two) times daily. 60 tablet 11  . ferrous sulfate 325 (65 FE) MG tablet TAKE 1 TABLET BY MOUTH EVERY DAY 30 tablet 5  . gabapentin (NEURONTIN) 300 MG capsule Take 1-2 capsules (300-600 mg total) by mouth 3 (three) times daily. 60 capsule 3  . mupirocin ointment (BACTROBAN) 2 % APPLY TOPICALLY DAILY. 22 g 2  . ondansetron (ZOFRAN) 4 MG tablet TAKE 1 TABLET BY MOUTH EVERY 8 HOURS AS NEEDED FOR NAUSEA AND VOMITING 30 tablet 5  . pantoprazole (PROTONIX) 40 MG tablet Take 1 tablet (40 mg total) by mouth daily. 90 tablet 2  . predniSONE (DELTASONE) 10 MG tablet Prednisone 10 mg: take 6 tabs a day x 6 days; then 5 tabs a day x 6 days; then 3 tabs a day x 6 days, then 1 tab a day x 6 days, then stop. Take pc. 90 tablet 1  . Saw Palmetto, Serenoa repens, (SAW PALMETTO PO) Take by mouth daily.    . traMADol (ULTRAM) 50 MG tablet Take 1-2 tablets (50-100 mg total) by mouth every 8 (eight) hours as needed. 120 tablet 2  . triamcinolone ointment (KENALOG) 0.1 % APPLY TO AFFECTED AREA TWICE DAILY 80 g 2  . verapamil (CALAN-SR) 240 MG CR tablet Take 1 tablet (240 mg total) by mouth at bedtime. 90 tablet 2   No facility-administered medications prior to visit.     ROS Review of Systems  Constitutional: Negative for appetite change, fatigue and unexpected weight change.  HENT: Negative for congestion, nosebleeds, sneezing, sore throat and trouble swallowing.   Eyes: Negative for itching and visual disturbance.  Respiratory: Negative for cough.   Cardiovascular: Negative for chest pain,  palpitations and leg swelling.  Gastrointestinal: Negative for abdominal distention, blood in stool, diarrhea and nausea.  Genitourinary: Negative for frequency and hematuria.  Musculoskeletal: Positive for back pain and gait problem. Negative for joint swelling and neck pain.  Skin: Negative for rash.  Neurological: Positive for weakness. Negative for dizziness, tremors and speech difficulty.  Psychiatric/Behavioral: Negative for agitation, dysphoric mood and sleep disturbance. The patient is not nervous/anxious.     Objective:  BP (!) 162/88   Pulse (!) 108   Wt 168 lb (76.2 kg)   SpO2 97%   BMI 22.78 kg/m   BP Readings from Last 3 Encounters:  12/08/15 (!) 162/88  11/18/15 (!) 188/90  10/28/15 (!) 158/92    Wt Readings from Last 3 Encounters:  12/08/15 168 lb (76.2 kg)  11/18/15 170 lb (77.1 kg)  10/28/15 172 lb (78 kg)    Physical Exam  Constitutional: Marvin Morrison is oriented to person, place, and time. Marvin Morrison appears well-developed. No distress.  NAD  HENT:  Mouth/Throat: Oropharynx is clear and moist.  Eyes: Conjunctivae are normal. Pupils are equal, round, and reactive to light.  Neck: Normal range of motion. No JVD present. No thyromegaly present.  Cardiovascular: Normal rate, regular rhythm, normal heart sounds and intact distal pulses.  Exam reveals no gallop and no friction rub.  No murmur heard. Pulmonary/Chest: Effort normal and breath sounds normal. No respiratory distress. Marvin Morrison has no wheezes. Marvin Morrison has no rales. Marvin Morrison exhibits no tenderness.  Abdominal: Soft. Bowel sounds are normal. Marvin Morrison exhibits no distension and no mass. There is no tenderness. There is no rebound and no guarding.  Musculoskeletal: Normal range of motion. Marvin Morrison exhibits tenderness. Marvin Morrison exhibits no edema.  Lymphadenopathy:    Marvin Morrison has no cervical adenopathy.  Neurological: Marvin Morrison is alert and oriented to person, place, and time. Marvin Morrison has normal reflexes. No cranial nerve deficit. Marvin Morrison exhibits normal muscle tone. Marvin Morrison displays  a negative Romberg sign. Coordination abnormal. Gait normal.  Skin: Skin is warm and dry. No rash noted.  Psychiatric: Marvin Morrison has a normal mood and affect. His behavior is normal. Judgment and thought content normal.  Ataxic Using a cane  Lab Results  Component Value Date   WBC 8.8 03/31/2015   HGB 11.8 (L) 03/31/2015   HCT 34.3 (L) 03/31/2015   PLT 169.0 03/31/2015   GLUCOSE 115 (H) 03/31/2015   CHOL 154 12/24/2013   TRIG 287.0 (H) 12/24/2013   HDL 18.10 (L) 12/24/2013   LDLDIRECT 92.1 12/24/2013   LDLCALC 102 (H) 06/13/2013   ALT 16 03/31/2015   AST 18 03/31/2015   NA 139 03/31/2015   K 4.1 03/31/2015   CL 103 03/31/2015   CREATININE 1.72 (H) 03/31/2015   BUN 25 (H) 03/31/2015   CO2 26 03/31/2015   TSH 0.45 04/18/2014   PSA 0.58 12/24/2013   HGBA1C 5.8 04/18/2014    Mr Lumbar Spine Wo Contrast  Result Date: 11/28/2015 CLINICAL DATA:  Low back pain radiating down both legs. EXAM: MRI LUMBAR SPINE WITHOUT CONTRAST TECHNIQUE: Multiplanar, multisequence MR imaging of the lumbar spine was performed. No intravenous contrast was administered. COMPARISON:  None. FINDINGS: Segmentation: Normal. The lowest disc space is considered to be L5-S1. Alignment:  Grade 1 retrolisthesis at L3-L4. Vertebrae: No acute compression fracture, facet edema or focal marrow lesion. Conus medullaris: Extends to the L1 level and appears normal. Paraspinal and other soft tissues: The visualized aorta, IVC and iliac vessels are normal. The visualized retroperitoneal organs and paraspinal soft tissues are normal. Disc levels: T12-L1: Evaluated on sagittal images only. No significant disc herniation, spinal canal stenosis or neuroforaminal narrowing. L1-L2: Schmorl's nodes at both endplates. No disc bulge. Normal facets. No spinal canal stenosis. No neuroforaminal stenosis. L2-L3: Severe disc space narrowing. Endplate osteophyte formation and diffuse disc bulge. Moderate spinal canal stenosis. Moderate bilateral  neuroforaminal stenosis. L3-L4: Severe disc space narrowing and diffuse disc bulge with mild bilateral facet hypertrophy. Moderate spinal canal stenosis. Severe bilateral neuroforaminal stenosis. L4-L5: Large right subarticular disc extrusion with superior migration. Severe bilateral facet hypertrophy with fluid in the widened facet joint spaces. Severe spinal canal stenosis. Severe bilateral neuroforaminal stenosis. L5-S1: Severe bilateral facet hypertrophy. Medium-sized left subarticular disc protrusion narrowing the left lateral recess and displacing the descending left S1 nerve root. No central spinal canal stenosis. Mild left neuroforaminal stenosis. IMPRESSION: 1. 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 This also results in severe bilateral foraminal stenosis. 2. Left lateral recess narrowing at L5-S1 secondary to severe bilateral facet arthrosis and subarticular disc protrusion on the left. Correlate for left S1 radiculopathy. 3. Moderate spinal canal stenosis and moderate to severe bilateral foraminal stenosis at L2-L3 and L3-L4. Electronically Signed   By: Ulyses Jarred M.D.   On: 11/28/2015 19:36  Assessment & Plan:   There are no diagnoses linked to this encounter. I am having Marvin Morrison maintain his (Saw Palmetto, Serenoa repens, (SAW PALMETTO PO)), triamcinolone ointment, acyclovir, verapamil, pantoprazole, ferrous sulfate, mupirocin ointment, ondansetron, traMADol, predniSONE, and gabapentin.  No orders of the defined types were placed in this encounter.    Follow-up: No Follow-up on file.  Walker Kehr, MD

## 2015-12-08 NOTE — Progress Notes (Signed)
Pre visit review using our clinic review tool, if applicable. No additional management support is needed unless otherwise documented below in the visit note. 

## 2015-12-30 ENCOUNTER — Other Ambulatory Visit: Payer: Self-pay | Admitting: Internal Medicine

## 2015-12-30 DIAGNOSIS — M48062 Spinal stenosis, lumbar region with neurogenic claudication: Secondary | ICD-10-CM | POA: Diagnosis not present

## 2016-01-01 ENCOUNTER — Other Ambulatory Visit: Payer: Self-pay | Admitting: Neurological Surgery

## 2016-01-08 ENCOUNTER — Encounter (HOSPITAL_COMMUNITY): Payer: Self-pay | Admitting: *Deleted

## 2016-01-08 NOTE — Progress Notes (Addendum)
Spoke with pt for pre-op call. Pt denies cardiac history, chest pain or sob. 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 He refused nephrology consult.  Pt states his medication list is the same as what we have, states nothing has changed since he was seen by Dr. Alain Marion in December.

## 2016-01-11 ENCOUNTER — Inpatient Hospital Stay (HOSPITAL_COMMUNITY): Payer: Medicare Other | Admitting: Emergency Medicine

## 2016-01-11 ENCOUNTER — Encounter (HOSPITAL_COMMUNITY): Payer: Self-pay | Admitting: *Deleted

## 2016-01-11 ENCOUNTER — Inpatient Hospital Stay (HOSPITAL_COMMUNITY): Payer: Medicare Other

## 2016-01-11 ENCOUNTER — Inpatient Hospital Stay (HOSPITAL_COMMUNITY)
Admission: RE | Admit: 2016-01-11 | Discharge: 2016-01-13 | DRG: 519 | Disposition: A | Discharge status: Rehabilitation Facility | Payer: Medicare Other | Source: Ambulatory Visit | Attending: Internal Medicine | Admitting: Internal Medicine

## 2016-01-11 ENCOUNTER — Encounter (HOSPITAL_COMMUNITY)
Admission: RE | Disposition: A | Discharge status: Rehabilitation Facility | Payer: Self-pay | Source: Ambulatory Visit | Attending: Internal Medicine

## 2016-01-11 DIAGNOSIS — R739 Hyperglycemia, unspecified: Secondary | ICD-10-CM | POA: Diagnosis not present

## 2016-01-11 DIAGNOSIS — M5417 Radiculopathy, lumbosacral region: Secondary | ICD-10-CM | POA: Diagnosis not present

## 2016-01-11 DIAGNOSIS — R339 Retention of urine, unspecified: Secondary | ICD-10-CM

## 2016-01-11 DIAGNOSIS — N179 Acute kidney failure, unspecified: Secondary | ICD-10-CM | POA: Diagnosis not present

## 2016-01-11 DIAGNOSIS — M5415 Radiculopathy, thoracolumbar region: Secondary | ICD-10-CM | POA: Diagnosis not present

## 2016-01-11 DIAGNOSIS — G8220 Paraplegia, unspecified: Secondary | ICD-10-CM | POA: Diagnosis present

## 2016-01-11 DIAGNOSIS — D500 Iron deficiency anemia secondary to blood loss (chronic): Secondary | ICD-10-CM | POA: Diagnosis not present

## 2016-01-11 DIAGNOSIS — N183 Chronic kidney disease, stage 3 unspecified: Secondary | ICD-10-CM

## 2016-01-11 DIAGNOSIS — R202 Paresthesia of skin: Secondary | ICD-10-CM | POA: Diagnosis present

## 2016-01-11 DIAGNOSIS — E440 Moderate protein-calorie malnutrition: Secondary | ICD-10-CM | POA: Insufficient documentation

## 2016-01-11 DIAGNOSIS — M48062 Spinal stenosis, lumbar region with neurogenic claudication: Secondary | ICD-10-CM | POA: Diagnosis present

## 2016-01-11 DIAGNOSIS — K5909 Other constipation: Secondary | ICD-10-CM | POA: Diagnosis not present

## 2016-01-11 DIAGNOSIS — Z993 Dependence on wheelchair: Secondary | ICD-10-CM

## 2016-01-11 DIAGNOSIS — M792 Neuralgia and neuritis, unspecified: Secondary | ICD-10-CM | POA: Diagnosis not present

## 2016-01-11 DIAGNOSIS — I129 Hypertensive chronic kidney disease with stage 1 through stage 4 chronic kidney disease, or unspecified chronic kidney disease: Secondary | ICD-10-CM | POA: Diagnosis not present

## 2016-01-11 DIAGNOSIS — M4727 Other spondylosis with radiculopathy, lumbosacral region: Secondary | ICD-10-CM | POA: Diagnosis present

## 2016-01-11 DIAGNOSIS — Z6822 Body mass index (BMI) 22.0-22.9, adult: Secondary | ICD-10-CM

## 2016-01-11 DIAGNOSIS — D62 Acute posthemorrhagic anemia: Secondary | ICD-10-CM | POA: Diagnosis not present

## 2016-01-11 DIAGNOSIS — E875 Hyperkalemia: Secondary | ICD-10-CM | POA: Diagnosis not present

## 2016-01-11 DIAGNOSIS — I10 Essential (primary) hypertension: Secondary | ICD-10-CM | POA: Diagnosis present

## 2016-01-11 DIAGNOSIS — R0989 Other specified symptoms and signs involving the circulatory and respiratory systems: Secondary | ICD-10-CM | POA: Diagnosis not present

## 2016-01-11 DIAGNOSIS — E785 Hyperlipidemia, unspecified: Secondary | ICD-10-CM | POA: Diagnosis present

## 2016-01-11 DIAGNOSIS — L89312 Pressure ulcer of right buttock, stage 2: Secondary | ICD-10-CM | POA: Diagnosis present

## 2016-01-11 DIAGNOSIS — D509 Iron deficiency anemia, unspecified: Secondary | ICD-10-CM | POA: Diagnosis not present

## 2016-01-11 DIAGNOSIS — D72820 Lymphocytosis (symptomatic): Secondary | ICD-10-CM | POA: Diagnosis not present

## 2016-01-11 DIAGNOSIS — D72829 Elevated white blood cell count, unspecified: Secondary | ICD-10-CM

## 2016-01-11 DIAGNOSIS — K5903 Drug induced constipation: Secondary | ICD-10-CM | POA: Diagnosis not present

## 2016-01-11 DIAGNOSIS — M4807 Spinal stenosis, lumbosacral region: Secondary | ICD-10-CM | POA: Diagnosis present

## 2016-01-11 DIAGNOSIS — M5136 Other intervertebral disc degeneration, lumbar region: Secondary | ICD-10-CM | POA: Diagnosis not present

## 2016-01-11 DIAGNOSIS — M79609 Pain in unspecified limb: Secondary | ICD-10-CM | POA: Diagnosis not present

## 2016-01-11 DIAGNOSIS — N390 Urinary tract infection, site not specified: Secondary | ICD-10-CM | POA: Diagnosis not present

## 2016-01-11 DIAGNOSIS — L89150 Pressure ulcer of sacral region, unstageable: Secondary | ICD-10-CM | POA: Diagnosis present

## 2016-01-11 DIAGNOSIS — Z8249 Family history of ischemic heart disease and other diseases of the circulatory system: Secondary | ICD-10-CM

## 2016-01-11 DIAGNOSIS — L8990 Pressure ulcer of unspecified site, unspecified stage: Secondary | ICD-10-CM | POA: Insufficient documentation

## 2016-01-11 DIAGNOSIS — M5117 Intervertebral disc disorders with radiculopathy, lumbosacral region: Secondary | ICD-10-CM | POA: Diagnosis present

## 2016-01-11 DIAGNOSIS — G9611 Dural tear: Secondary | ICD-10-CM | POA: Diagnosis not present

## 2016-01-11 DIAGNOSIS — M47816 Spondylosis without myelopathy or radiculopathy, lumbar region: Secondary | ICD-10-CM | POA: Diagnosis not present

## 2016-01-11 DIAGNOSIS — R195 Other fecal abnormalities: Secondary | ICD-10-CM | POA: Diagnosis not present

## 2016-01-11 DIAGNOSIS — M7989 Other specified soft tissue disorders: Secondary | ICD-10-CM | POA: Diagnosis not present

## 2016-01-11 DIAGNOSIS — E784 Other hyperlipidemia: Secondary | ICD-10-CM

## 2016-01-11 DIAGNOSIS — J90 Pleural effusion, not elsewhere classified: Secondary | ICD-10-CM | POA: Diagnosis not present

## 2016-01-11 DIAGNOSIS — M4726 Other spondylosis with radiculopathy, lumbar region: Secondary | ICD-10-CM | POA: Diagnosis not present

## 2016-01-11 DIAGNOSIS — L89322 Pressure ulcer of left buttock, stage 2: Secondary | ICD-10-CM | POA: Diagnosis present

## 2016-01-11 DIAGNOSIS — M48061 Spinal stenosis, lumbar region without neurogenic claudication: Secondary | ICD-10-CM | POA: Diagnosis present

## 2016-01-11 DIAGNOSIS — E782 Mixed hyperlipidemia: Secondary | ICD-10-CM | POA: Diagnosis not present

## 2016-01-11 DIAGNOSIS — E876 Hypokalemia: Secondary | ICD-10-CM | POA: Diagnosis present

## 2016-01-11 DIAGNOSIS — Z7401 Bed confinement status: Secondary | ICD-10-CM | POA: Diagnosis not present

## 2016-01-11 DIAGNOSIS — Z419 Encounter for procedure for purposes other than remedying health state, unspecified: Secondary | ICD-10-CM

## 2016-01-11 DIAGNOSIS — M5116 Intervertebral disc disorders with radiculopathy, lumbar region: Secondary | ICD-10-CM | POA: Diagnosis present

## 2016-01-11 DIAGNOSIS — R300 Dysuria: Secondary | ICD-10-CM | POA: Diagnosis not present

## 2016-01-11 DIAGNOSIS — M5126 Other intervertebral disc displacement, lumbar region: Secondary | ICD-10-CM | POA: Diagnosis present

## 2016-01-11 DIAGNOSIS — G8918 Other acute postprocedural pain: Secondary | ICD-10-CM

## 2016-01-11 DIAGNOSIS — R634 Abnormal weight loss: Secondary | ICD-10-CM | POA: Diagnosis present

## 2016-01-11 DIAGNOSIS — R197 Diarrhea, unspecified: Secondary | ICD-10-CM | POA: Diagnosis not present

## 2016-01-11 DIAGNOSIS — K5901 Slow transit constipation: Secondary | ICD-10-CM | POA: Diagnosis not present

## 2016-01-11 HISTORY — DX: Chronic kidney disease, unspecified: N18.9

## 2016-01-11 HISTORY — PX: LUMBAR LAMINECTOMY/DECOMPRESSION MICRODISCECTOMY: SHX5026

## 2016-01-11 HISTORY — DX: Unspecified osteoarthritis, unspecified site: M19.90

## 2016-01-11 HISTORY — DX: Pneumonia, unspecified organism: J18.9

## 2016-01-11 LAB — SURGICAL PCR SCREEN
MRSA, PCR: NEGATIVE
STAPHYLOCOCCUS AUREUS: NEGATIVE

## 2016-01-11 LAB — ABO/RH: ABO/RH(D): A NEG

## 2016-01-11 LAB — BASIC METABOLIC PANEL
ANION GAP: 10 (ref 5–15)
BUN: 25 mg/dL — ABNORMAL HIGH (ref 6–20)
CHLORIDE: 101 mmol/L (ref 101–111)
CO2: 26 mmol/L (ref 22–32)
Calcium: 8.5 mg/dL — ABNORMAL LOW (ref 8.9–10.3)
Creatinine, Ser: 1.86 mg/dL — ABNORMAL HIGH (ref 0.61–1.24)
GFR calc Af Amer: 39 mL/min — ABNORMAL LOW (ref >60)
GFR, EST NON AFRICAN AMERICAN: 33 mL/min — AB (ref >60)
GLUCOSE: 87 mg/dL (ref 65–99)
POTASSIUM: 2.5 mmol/L — AB (ref 3.5–5.1)
SODIUM: 137 mmol/L (ref 135–145)

## 2016-01-11 LAB — CBC
HEMATOCRIT: 26.3 % — AB (ref 39.0–52.0)
HEMOGLOBIN: 9.1 g/dL — AB (ref 13.0–17.0)
MCH: 33.1 pg (ref 26.0–34.0)
MCHC: 34.6 g/dL (ref 30.0–36.0)
MCV: 95.6 fL (ref 78.0–100.0)
Platelets: 279 10*3/uL (ref 150–400)
RBC: 2.75 MIL/uL — ABNORMAL LOW (ref 4.22–5.81)
RDW: 15.2 % (ref 11.5–15.5)
WBC: 17.4 10*3/uL — AB (ref 4.0–10.5)

## 2016-01-11 LAB — PREPARE RBC (CROSSMATCH)

## 2016-01-11 LAB — MAGNESIUM: Magnesium: 1.7 mg/dL (ref 1.7–2.4)

## 2016-01-11 SURGERY — LUMBAR LAMINECTOMY/DECOMPRESSION MICRODISCECTOMY 4 LEVEL
Anesthesia: General | Site: Back

## 2016-01-11 MED ORDER — HEMOSTATIC AGENTS (NO CHARGE) OPTIME
TOPICAL | Status: DC | PRN
  Administered 2016-01-11: 1 via TOPICAL

## 2016-01-11 MED ORDER — LIDOCAINE HCL (CARDIAC) 20 MG/ML IV SOLN
INTRAVENOUS | Status: DC | PRN
  Administered 2016-01-11: 40 mg via INTRAVENOUS

## 2016-01-11 MED ORDER — METHOCARBAMOL 1000 MG/10ML IJ SOLN
500.0000 mg | Freq: Four times a day (QID) | INTRAMUSCULAR | Status: DC | PRN
  Filled 2016-01-11: qty 5

## 2016-01-11 MED ORDER — SUGAMMADEX SODIUM 200 MG/2ML IV SOLN
INTRAVENOUS | Status: DC | PRN
  Administered 2016-01-11: 150 mg via INTRAVENOUS

## 2016-01-11 MED ORDER — SODIUM CHLORIDE 0.9 % IV SOLN: 250.0000 mL | INTRAVENOUS | Status: DC

## 2016-01-11 MED ORDER — ACETAMINOPHEN 325 MG PO TABS: 650.0000 mg | ORAL_TABLET | ORAL | Status: DC | PRN

## 2016-01-11 MED ORDER — FENTANYL CITRATE (PF) 100 MCG/2ML IJ SOLN
INTRAMUSCULAR | Status: DC | PRN
  Administered 2016-01-11: 50 ug via INTRAVENOUS
  Administered 2016-01-11: 100 ug via INTRAVENOUS
  Administered 2016-01-11: 50 ug via INTRAVENOUS
  Administered 2016-01-11: 100 ug via INTRAVENOUS

## 2016-01-11 MED ORDER — SENNA 8.6 MG PO TABS
1.0000 | ORAL_TABLET | Freq: Two times a day (BID) | ORAL | Status: DC
  Administered 2016-01-11 – 2016-01-13 (×4): 8.6 mg via ORAL
  Filled 2016-01-11 (×4): qty 1

## 2016-01-11 MED ORDER — ONDANSETRON HCL 4 MG PO TABS
4.0000 mg | ORAL_TABLET | Freq: Three times a day (TID) | ORAL | Status: DC | PRN
  Administered 2016-01-11 – 2016-01-13 (×3): 4 mg via ORAL
  Filled 2016-01-11 (×3): qty 1

## 2016-01-11 MED ORDER — THROMBIN 20000 UNITS EX SOLR
CUTANEOUS | Status: AC
  Filled 2016-01-11: qty 20000

## 2016-01-11 MED ORDER — ALBUMIN HUMAN 5 % IV SOLN
INTRAVENOUS | Status: DC | PRN
  Administered 2016-01-11: 16:00:00 via INTRAVENOUS

## 2016-01-11 MED ORDER — MORPHINE SULFATE (PF) 2 MG/ML IV SOLN
1.0000 mg | INTRAVENOUS | Status: DC | PRN
  Administered 2016-01-12 – 2016-01-13 (×5): 2 mg via INTRAVENOUS
  Filled 2016-01-11 (×5): qty 1

## 2016-01-11 MED ORDER — THROMBIN 5000 UNITS EX SOLR
CUTANEOUS | Status: AC
  Filled 2016-01-11: qty 5000

## 2016-01-11 MED ORDER — FENTANYL CITRATE (PF) 100 MCG/2ML IJ SOLN
INTRAMUSCULAR | Status: AC
  Filled 2016-01-11: qty 2

## 2016-01-11 MED ORDER — SODIUM CHLORIDE 0.9 % IV SOLN
30.0000 meq | Freq: Once | INTRAVENOUS | Status: AC
  Administered 2016-01-11: 30 meq via INTRAVENOUS
  Filled 2016-01-11: qty 15

## 2016-01-11 MED ORDER — ROCURONIUM BROMIDE 50 MG/5ML IV SOSY
PREFILLED_SYRINGE | INTRAVENOUS | Status: AC
  Filled 2016-01-11: qty 5

## 2016-01-11 MED ORDER — FENTANYL CITRATE (PF) 100 MCG/2ML IJ SOLN
INTRAMUSCULAR | Status: AC
  Administered 2016-01-11: 50 ug via INTRAVENOUS
  Filled 2016-01-11: qty 2

## 2016-01-11 MED ORDER — VERAPAMIL HCL ER 240 MG PO TBCR
240.0000 mg | EXTENDED_RELEASE_TABLET | Freq: Every day | ORAL | Status: DC
  Administered 2016-01-11 – 2016-01-12 (×2): 240 mg via ORAL
  Filled 2016-01-11 (×3): qty 1

## 2016-01-11 MED ORDER — THROMBIN 20000 UNITS EX SOLR
CUTANEOUS | Status: DC | PRN
  Administered 2016-01-11: 16:00:00 via TOPICAL

## 2016-01-11 MED ORDER — MENTHOL 3 MG MT LOZG: 1.0000 | LOZENGE | OROMUCOSAL | Status: DC | PRN

## 2016-01-11 MED ORDER — PHENYLEPHRINE HCL 10 MG/ML IJ SOLN
INTRAMUSCULAR | Status: DC | PRN
  Administered 2016-01-11 (×2): 120 ug via INTRAVENOUS

## 2016-01-11 MED ORDER — ONDANSETRON HCL 4 MG/2ML IJ SOLN
INTRAMUSCULAR | Status: AC
  Filled 2016-01-11: qty 2

## 2016-01-11 MED ORDER — LACTATED RINGERS IV SOLN
INTRAVENOUS | Status: DC | PRN
  Administered 2016-01-11 (×2): via INTRAVENOUS

## 2016-01-11 MED ORDER — METHOCARBAMOL 500 MG PO TABS
500.0000 mg | ORAL_TABLET | Freq: Four times a day (QID) | ORAL | Status: DC | PRN
  Administered 2016-01-12 – 2016-01-13 (×2): 500 mg via ORAL
  Filled 2016-01-11 (×2): qty 1

## 2016-01-11 MED ORDER — PANTOPRAZOLE SODIUM 40 MG PO TBEC
40.0000 mg | DELAYED_RELEASE_TABLET | Freq: Every day | ORAL | Status: DC
  Administered 2016-01-11 – 2016-01-13 (×3): 40 mg via ORAL
  Filled 2016-01-11 (×3): qty 1

## 2016-01-11 MED ORDER — DOCUSATE SODIUM 100 MG PO CAPS
100.0000 mg | ORAL_CAPSULE | Freq: Two times a day (BID) | ORAL | Status: DC
  Administered 2016-01-11 – 2016-01-13 (×4): 100 mg via ORAL
  Filled 2016-01-11 (×4): qty 1

## 2016-01-11 MED ORDER — OXYCODONE HCL 5 MG PO TABS
5.0000 mg | ORAL_TABLET | Freq: Once | ORAL | Status: DC | PRN
Start: 2016-01-11 — End: 2016-01-11

## 2016-01-11 MED ORDER — CEFAZOLIN SODIUM-DEXTROSE 2-4 GM/100ML-% IV SOLN
INTRAVENOUS | Status: AC
  Filled 2016-01-11: qty 100

## 2016-01-11 MED ORDER — ROCURONIUM BROMIDE 100 MG/10ML IV SOLN
INTRAVENOUS | Status: DC | PRN
  Administered 2016-01-11: 50 mg via INTRAVENOUS

## 2016-01-11 MED ORDER — SODIUM CHLORIDE 0.9 % IV SOLN
INTRAVENOUS | Status: DC
  Administered 2016-01-11: 22:00:00 via INTRAVENOUS

## 2016-01-11 MED ORDER — CEFAZOLIN SODIUM-DEXTROSE 2-4 GM/100ML-% IV SOLN
2.0000 g | Freq: Three times a day (TID) | INTRAVENOUS | Status: AC
  Administered 2016-01-11 – 2016-01-12 (×2): 2 g via INTRAVENOUS
  Filled 2016-01-11 (×2): qty 100

## 2016-01-11 MED ORDER — FENTANYL CITRATE (PF) 100 MCG/2ML IJ SOLN
25.0000 ug | INTRAMUSCULAR | Status: DC | PRN
  Administered 2016-01-11: 50 ug via INTRAVENOUS

## 2016-01-11 MED ORDER — ACYCLOVIR 800 MG PO TABS
400.0000 mg | ORAL_TABLET | Freq: Two times a day (BID) | ORAL | Status: DC
  Administered 2016-01-11 – 2016-01-13 (×4): 400 mg via ORAL
  Filled 2016-01-11 (×4): qty 1

## 2016-01-11 MED ORDER — ROCURONIUM BROMIDE 50 MG/5ML IV SOSY
PREFILLED_SYRINGE | INTRAVENOUS | Status: AC
  Filled 2016-01-11: qty 10

## 2016-01-11 MED ORDER — CHLORHEXIDINE GLUCONATE CLOTH 2 % EX PADS: 6.0000 | MEDICATED_PAD | Freq: Once | CUTANEOUS | Status: DC

## 2016-01-11 MED ORDER — SUGAMMADEX SODIUM 200 MG/2ML IV SOLN
INTRAVENOUS | Status: AC
  Filled 2016-01-11: qty 2

## 2016-01-11 MED ORDER — SUCCINYLCHOLINE CHLORIDE 20 MG/ML IJ SOLN
INTRAMUSCULAR | Status: DC | PRN
  Administered 2016-01-11: 100 mg via INTRAVENOUS

## 2016-01-11 MED ORDER — THROMBIN 5000 UNITS EX SOLR
CUTANEOUS | Status: AC
  Filled 2016-01-11: qty 10000

## 2016-01-11 MED ORDER — BISACODYL 10 MG RE SUPP: 10.0000 mg | Freq: Every day | RECTAL | Status: DC | PRN

## 2016-01-11 MED ORDER — 0.9 % SODIUM CHLORIDE (POUR BTL) OPTIME
TOPICAL | Status: DC | PRN
  Administered 2016-01-11: 1000 mL

## 2016-01-11 MED ORDER — BUPIVACAINE HCL (PF) 0.5 % IJ SOLN
INTRAMUSCULAR | Status: DC | PRN
  Administered 2016-01-11: 5 mL
  Administered 2016-01-11: 20 mL

## 2016-01-11 MED ORDER — ONDANSETRON HCL 4 MG/2ML IJ SOLN: 4.0000 mg | Freq: Once | INTRAMUSCULAR | Status: DC | PRN

## 2016-01-11 MED ORDER — PHENYLEPHRINE HCL 10 MG/ML IJ SOLN
INTRAMUSCULAR | Status: DC | PRN
  Administered 2016-01-11: 40 ug/min via INTRAVENOUS

## 2016-01-11 MED ORDER — OXYCODONE-ACETAMINOPHEN 5-325 MG PO TABS
1.0000 | ORAL_TABLET | ORAL | Status: DC | PRN
  Administered 2016-01-12: 2 via ORAL
  Administered 2016-01-12 – 2016-01-13 (×3): 1 via ORAL
  Administered 2016-01-13 (×2): 2 via ORAL
  Filled 2016-01-11: qty 2
  Filled 2016-01-11: qty 1
  Filled 2016-01-11 (×2): qty 2
  Filled 2016-01-11 (×2): qty 1

## 2016-01-11 MED ORDER — ONDANSETRON HCL 4 MG/2ML IJ SOLN: 4.0000 mg | INTRAMUSCULAR | Status: DC | PRN

## 2016-01-11 MED ORDER — THROMBIN 5000 UNITS EX SOLR
OROMUCOSAL | Status: DC | PRN
  Administered 2016-01-11: 5 mL via TOPICAL
  Administered 2016-01-11 (×2): via TOPICAL

## 2016-01-11 MED ORDER — SODIUM CHLORIDE 0.9% FLUSH: 3.0000 mL | Freq: Two times a day (BID) | INTRAVENOUS | Status: DC

## 2016-01-11 MED ORDER — LIDOCAINE-EPINEPHRINE (PF) 2 %-1:200000 IJ SOLN
INTRAMUSCULAR | Status: AC
  Filled 2016-01-11: qty 20

## 2016-01-11 MED ORDER — PROPOFOL 10 MG/ML IV BOLUS
INTRAVENOUS | Status: AC
  Filled 2016-01-11: qty 20

## 2016-01-11 MED ORDER — PHENOL 1.4 % MT LIQD
1.0000 | OROMUCOSAL | Status: DC | PRN
Start: 2016-01-11 — End: 2016-01-13

## 2016-01-11 MED ORDER — BUPIVACAINE HCL (PF) 0.5 % IJ SOLN
INTRAMUSCULAR | Status: AC
  Filled 2016-01-11: qty 30

## 2016-01-11 MED ORDER — ACETAMINOPHEN 650 MG RE SUPP: 650.0000 mg | RECTAL | Status: DC | PRN

## 2016-01-11 MED ORDER — POLYETHYLENE GLYCOL 3350 17 G PO PACK: 17.0000 g | PACK | Freq: Every day | ORAL | Status: DC | PRN

## 2016-01-11 MED ORDER — MUPIROCIN 2 % EX OINT
1.0000 "application " | TOPICAL_OINTMENT | Freq: Once | CUTANEOUS | Status: AC
  Administered 2016-01-11: 1 via TOPICAL

## 2016-01-11 MED ORDER — HYDROCODONE-ACETAMINOPHEN 5-325 MG PO TABS
1.0000 | ORAL_TABLET | ORAL | Status: DC | PRN
  Administered 2016-01-12: 2 via ORAL
  Administered 2016-01-12: 1 via ORAL
  Filled 2016-01-11: qty 1
  Filled 2016-01-11: qty 2

## 2016-01-11 MED ORDER — LIDOCAINE-EPINEPHRINE (PF) 2 %-1:200000 IJ SOLN
INTRAMUSCULAR | Status: DC | PRN
  Administered 2016-01-11: 5 mL via INTRADERMAL

## 2016-01-11 MED ORDER — BACITRACIN 50000 UNITS IM SOLR
INTRAMUSCULAR | Status: DC | PRN
  Administered 2016-01-11: 15:00:00

## 2016-01-11 MED ORDER — CEFAZOLIN SODIUM-DEXTROSE 2-4 GM/100ML-% IV SOLN: 2.0000 g | INTRAVENOUS | Status: DC

## 2016-01-11 MED ORDER — FERROUS SULFATE 325 (65 FE) MG PO TABS
325.0000 mg | ORAL_TABLET | Freq: Three times a day (TID) | ORAL | Status: DC
  Administered 2016-01-12 – 2016-01-13 (×6): 325 mg via ORAL
  Filled 2016-01-11 (×7): qty 1

## 2016-01-11 MED ORDER — OXYCODONE HCL 5 MG/5ML PO SOLN: 5.0000 mg | Freq: Once | ORAL | Status: DC | PRN

## 2016-01-11 MED ORDER — HYDROCORTISONE NA SUCCINATE PF 250 MG IJ SOLR
INTRAMUSCULAR | Status: AC
  Filled 2016-01-11: qty 250

## 2016-01-11 MED ORDER — PROPOFOL 10 MG/ML IV BOLUS
INTRAVENOUS | Status: DC | PRN
  Administered 2016-01-11: 150 mg via INTRAVENOUS

## 2016-01-11 MED ORDER — HYDROCORTISONE NA SUCCINATE PF 100 MG IJ SOLR
INTRAMUSCULAR | Status: DC | PRN
  Administered 2016-01-11: 100 mg via INTRAVENOUS

## 2016-01-11 MED ORDER — SODIUM CHLORIDE 0.9 % IV SOLN: Freq: Once | INTRAVENOUS | Status: DC

## 2016-01-11 MED ORDER — SODIUM CHLORIDE 0.9% FLUSH: 3.0000 mL | INTRAVENOUS | Status: DC | PRN

## 2016-01-11 MED ORDER — LIDOCAINE 2% (20 MG/ML) 5 ML SYRINGE
INTRAMUSCULAR | Status: AC
  Filled 2016-01-11: qty 5

## 2016-01-11 MED ORDER — ONDANSETRON HCL 4 MG/2ML IJ SOLN
INTRAMUSCULAR | Status: DC | PRN
  Administered 2016-01-11: 4 mg via INTRAVENOUS

## 2016-01-11 MED ORDER — SODIUM CHLORIDE 0.9 % IV SOLN
INTRAVENOUS | Status: DC
  Administered 2016-01-11: 12:00:00 via INTRAVENOUS

## 2016-01-11 MED ORDER — ALUM & MAG HYDROXIDE-SIMETH 200-200-20 MG/5ML PO SUSP: 30.0000 mL | Freq: Four times a day (QID) | ORAL | Status: DC | PRN

## 2016-01-11 MED ORDER — MUPIROCIN 2 % EX OINT
TOPICAL_OINTMENT | CUTANEOUS | Status: AC
  Administered 2016-01-11: 1 via TOPICAL
  Filled 2016-01-11: qty 22

## 2016-01-11 SURGICAL SUPPLY — 55 items
BAG DECANTER FOR FLEXI CONT (MISCELLANEOUS) ×3 IMPLANT
BLADE CLIPPER SURG (BLADE) IMPLANT
BUR ACORN 6.0 (BURR) IMPLANT
BUR ACORN 6.0MM (BURR)
BUR MATCHSTICK NEURO 3.0 LAGG (BURR) ×3 IMPLANT
CANISTER SUCT 3000ML PPV (MISCELLANEOUS) ×3 IMPLANT
CARTRIDGE OIL MAESTRO DRILL (MISCELLANEOUS) ×1 IMPLANT
DECANTER SPIKE VIAL GLASS SM (MISCELLANEOUS) ×3 IMPLANT
DERMABOND ADVANCED (GAUZE/BANDAGES/DRESSINGS) ×2
DERMABOND ADVANCED .7 DNX12 (GAUZE/BANDAGES/DRESSINGS) ×1 IMPLANT
DEVICE DISSECT PLASMABLAD 3.0S (MISCELLANEOUS) ×1 IMPLANT
DIFFUSER DRILL AIR PNEUMATIC (MISCELLANEOUS) ×3 IMPLANT
DRAPE HALF SHEET 40X57 (DRAPES) IMPLANT
DRAPE LAPAROTOMY 100X72X124 (DRAPES) ×3 IMPLANT
DRAPE MICROSCOPE LEICA (MISCELLANEOUS) IMPLANT
DRAPE POUCH INSTRU U-SHP 10X18 (DRAPES) ×3 IMPLANT
DRSG OPSITE POSTOP 4X8 (GAUZE/BANDAGES/DRESSINGS) ×3 IMPLANT
DURAPREP 26ML APPLICATOR (WOUND CARE) ×3 IMPLANT
ELECT REM PT RETURN 9FT ADLT (ELECTROSURGICAL) ×3
ELECTRODE REM PT RTRN 9FT ADLT (ELECTROSURGICAL) ×1 IMPLANT
GAUZE SPONGE 4X4 12PLY STRL (GAUZE/BANDAGES/DRESSINGS) ×3 IMPLANT
GAUZE SPONGE 4X4 16PLY XRAY LF (GAUZE/BANDAGES/DRESSINGS) ×3 IMPLANT
GLOVE BIO SURGEON STRL SZ8 (GLOVE) ×3 IMPLANT
GLOVE BIOGEL PI IND STRL 8.5 (GLOVE) ×1 IMPLANT
GLOVE BIOGEL PI INDICATOR 8.5 (GLOVE) ×2
GLOVE ECLIPSE 8.5 STRL (GLOVE) ×3 IMPLANT
GLOVE INDICATOR 7.5 STRL GRN (GLOVE) ×3 IMPLANT
GLOVE INDICATOR 8.5 STRL (GLOVE) ×3 IMPLANT
GOWN STRL REUS W/ TWL LRG LVL3 (GOWN DISPOSABLE) IMPLANT
GOWN STRL REUS W/ TWL XL LVL3 (GOWN DISPOSABLE) IMPLANT
GOWN STRL REUS W/TWL 2XL LVL3 (GOWN DISPOSABLE) ×3 IMPLANT
GOWN STRL REUS W/TWL LRG LVL3 (GOWN DISPOSABLE)
GOWN STRL REUS W/TWL XL LVL3 (GOWN DISPOSABLE)
HEMOSTAT POWDER KIT SURGIFOAM (HEMOSTASIS) ×9 IMPLANT
KIT BASIN OR (CUSTOM PROCEDURE TRAY) ×3 IMPLANT
KIT ROOM TURNOVER OR (KITS) ×3 IMPLANT
NEEDLE HYPO 22GX1.5 SAFETY (NEEDLE) ×3 IMPLANT
NEEDLE SPNL 20GX3.5 QUINCKE YW (NEEDLE) IMPLANT
NS IRRIG 1000ML POUR BTL (IV SOLUTION) ×3 IMPLANT
OIL CARTRIDGE MAESTRO DRILL (MISCELLANEOUS) ×3
PACK LAMINECTOMY NEURO (CUSTOM PROCEDURE TRAY) ×3 IMPLANT
PAD ARMBOARD 7.5X6 YLW CONV (MISCELLANEOUS) ×9 IMPLANT
PATTIES SURGICAL .5 X1 (DISPOSABLE) ×3 IMPLANT
PLASMABLADE 3.0S (MISCELLANEOUS) ×3
RUBBERBAND STERILE (MISCELLANEOUS) IMPLANT
SEALANT ADHERUS EXTEND TIP (MISCELLANEOUS) ×3 IMPLANT
SPONGE SURGIFOAM ABS GEL SZ50 (HEMOSTASIS) ×6 IMPLANT
SUT VIC AB 1 CT1 18XBRD ANBCTR (SUTURE) ×2 IMPLANT
SUT VIC AB 1 CT1 8-18 (SUTURE) ×4
SUT VIC AB 2-0 CP2 18 (SUTURE) ×6 IMPLANT
SUT VIC AB 3-0 SH 8-18 (SUTURE) ×6 IMPLANT
TOWEL OR 17X24 6PK STRL BLUE (TOWEL DISPOSABLE) ×3 IMPLANT
TOWEL OR 17X26 10 PK STRL BLUE (TOWEL DISPOSABLE) ×3 IMPLANT
TRAY FOLEY W/METER SILVER 16FR (SET/KITS/TRAYS/PACK) ×3 IMPLANT
WATER STERILE IRR 1000ML POUR (IV SOLUTION) ×3 IMPLANT

## 2016-01-11 NOTE — Progress Notes (Signed)
Arrived from PACU to 5C07. Alert and orientedx4. Denies any pain. Dressing CDI.  Oriented to room.

## 2016-01-11 NOTE — Progress Notes (Signed)
Patient made RN aware of bed sores on bottom

## 2016-01-11 NOTE — Consult Note (Signed)
Medical Consultation   Marvin Morrison  [id number]  DOB: 08/05/1940  DOA: 01/11/2016  PCP: Walker Kehr, MD   Outpatient Specialists: Neurosurgery Dr. Ellene Route   Requesting physician: Dr. Ellene Route  Reason for consultation: Management of medical problems  History of Present Illness: Marvin Morrison is an 76 y.o. male with past medical history listed below who was admitted into the hospital for treatment by neurosurgery for advanced spondylitic stenosis. Concern was for management of medical problems including suspected malnutrition, hypokalemia, chronic kidney disease, and hypertension. Patient is currently sedated as he has been evaluated after procedure.  Review of Systems:  ROS unable to assess secondary to sedation    Past Medical History: Past Medical History:  Diagnosis Date  . Arthritis   . Chronic kidney disease   . Colitis   . Diarrhea   . Eczema   . Elevated glucose   . HTN (hypertension)   . Hyperlipidemia   . Pneumonia   . Shingles 2009    Past Surgical History: Past Surgical History:  Procedure Laterality Date  . TONSILLECTOMY     Age 2     Allergies:   Allergies  Allergen Reactions  . Gabapentin Other (See Comments)    DIZZY FALLS      Social History:  reports that he has never smoked. He has never used smokeless tobacco. He reports that he does not drink alcohol or use drugs.   Family History: Family History  Problem Relation Age of Onset  . Hypertension Mother   . Hyperlipidemia Father   . Hypertension Father    Based on chart.   Physical Exam: Vitals:   01/08/16 1118 01/11/16 1052 01/11/16 1343 01/11/16 1735  BP:  111/62  (!) 143/77  Pulse:  (!) 101    Resp:  18    Temp:  98.5 F (36.9 C)  97.6 F (36.4 C)  TempSrc:  Oral    SpO2:  94%    Weight: 72.6 kg (160 lb 0.9 oz) 72.6 kg (160 lb) 72.6 kg (160 lb 0.9 oz)   Height:   6' (1.829 m)     Constitutional:  in nad, resting comfortably, supine in  bed ENMT: external ears and nose appear normal,            Lips appears normal, oropharynx mucosa,  Neck: neck appears normal, no masses,  CVS: S1-S2 clear, no murmur rubs or gallops, no LE edema, normal pedal pulses  Respiratory:  clear to auscultation bilaterally, no wheezing, rales or rhonchi. Respiratory effort normal. No accessory muscle use.  Abdomen: soft nontender, nondistended, normal bowel sounds,  Musculoskeletal: : no cyanosis, clubbing  Neuro: sedated, no facial asymmetry, arousable Psych:unable to assess due to sedation Skin: no rashes or lesions or ulcers, no induration or nodules on limited exam. Unable to assess sacral area during this evaluation   Data reviewed:  I have personally reviewed following labs and imaging studies Labs:  CBC:  Recent Labs Lab 01/11/16 1218  WBC 17.4*  HGB 9.1*  HCT 26.3*  MCV 95.6  PLT 123XX123    Basic Metabolic Panel:  Recent Labs Lab 01/11/16 1218  NA 137  K 2.5*  CL 101  CO2 26  GLUCOSE 87  BUN 25*  CREATININE 1.86*  CALCIUM 8.5*   GFR Estimated Creatinine Clearance: 34.2 mL/min (by C-G formula based on SCr of 1.86 mg/dL (H)). Liver Function Tests: No results for  input(s): AST, ALT, ALKPHOS, BILITOT, PROT, ALBUMIN in the last 168 hours. No results for input(s): LIPASE, AMYLASE in the last 168 hours. No results for input(s): AMMONIA in the last 168 hours. Coagulation profile No results for input(s): INR, PROTIME in the last 168 hours.  Cardiac Enzymes: No results for input(s): CKTOTAL, CKMB, CKMBINDEX, TROPONINI in the last 168 hours. BNP: Invalid input(s): POCBNP CBG: No results for input(s): GLUCAP in the last 168 hours. D-Dimer No results for input(s): DDIMER in the last 72 hours. Hgb A1c No results for input(s): HGBA1C in the last 72 hours. Lipid Profile No results for input(s): CHOL, HDL, LDLCALC, TRIG, CHOLHDL, LDLDIRECT in the last 72 hours. Thyroid function studies No results for input(s): TSH, T4TOTAL,  T3FREE, THYROIDAB in the last 72 hours.  Invalid input(s): FREET3 Anemia work up No results for input(s): VITAMINB12, FOLATE, FERRITIN, TIBC, IRON, RETICCTPCT in the last 72 hours. Urinalysis    Component Value Date/Time   COLORURINE YELLOW 04/25/2014 1653   APPEARANCEUR CLEAR 04/25/2014 1653   LABSPEC 1.020 04/25/2014 1653   PHURINE 5.5 04/25/2014 1653   GLUCOSEU NEGATIVE 04/25/2014 1653   HGBUR NEGATIVE 04/25/2014 1653   BILIRUBINUR NEGATIVE 04/25/2014 1653   KETONESUR NEGATIVE 04/25/2014 1653   UROBILINOGEN 0.2 04/25/2014 1653   NITRITE NEGATIVE 04/25/2014 [place] 04/25/2014 1653     Microbiology Recent Results (from the past 240 hour(s))  Surgical pcr screen     Status: None   Collection Time: 01/11/16 12:18 PM  Result Value Ref Range Status   MRSA, PCR NEGATIVE NEGATIVE Final   Staphylococcus aureus NEGATIVE NEGATIVE Final    Comment:        The Xpert SA Assay (FDA approved for NASAL specimens in patients over 60 years of age), is one component of a comprehensive surveillance program.  Test performance has been validated by [facility] for patients greater than or equal to 35 year old. It is not intended to diagnose infection nor to guide or monitor treatment.        Inpatient Medications:   Scheduled Meds: . sodium chloride   Intravenous Once  . ceFAZolin      . [START ON 01/12/2016]  ceFAZolin (ANCEF) IV  2 g Intravenous On Call to OR  . Chlorhexidine Gluconate Cloth  6 each Topical Once   And  . Chlorhexidine Gluconate Cloth  6 each Topical Once  . potassium chloride (KCL MULTIRUN) 30 mEq in 265 mL IVPB  30 mEq Intravenous Once   Continuous Infusions: . sodium chloride 10 mL/hr at 01/11/16 1226  . sodium chloride       Radiological Exams on Admission: No results found.  Impression/Recommendations Active Problems:  Hypokalemia - We'll plan on replacing IV and orally. Reassess next a.m. -Assess magnesium level    Leukocytosis -In context of recent operation. Patient currently on cefazolin.    Hyperlipidemia   Essential hypertension - Patient is on verapamil, may have to hold will reassess serum creatinine levels next a.m.    Anemia, iron deficiency - Patient has history of iron deficiency. Was recently transfused. - Iron supplementation    Loss of weight - Please consult for dietitian to evaluate. Suspect some sort of malnutrition.    Lumbar stenosis - Status post neuro surgeon evaluation and treatment. Please refer to operative report  Thank you for this consultation.  Our [facility] hospitalist team will follow the patient with you.   Time Spent: <> 35 minutes  Velvet Bathe M.D. [facility]  01/11/2016, 6:07 PM

## 2016-01-11 NOTE — Anesthesia Procedure Notes (Signed)
Procedure Name: Intubation Date/Time: 01/11/2016 2:36 PM Performed by: Trixie Deis A Pre-anesthesia Checklist: Patient identified, Emergency Drugs available, Suction available and Patient being monitored Patient Re-evaluated:Patient Re-evaluated prior to inductionOxygen Delivery Method: Circle System Utilized Preoxygenation: Pre-oxygenation with 100% oxygen Intubation Type: IV induction Ventilation: Mask ventilation without difficulty Laryngoscope Size: Mac and 4 Grade View: Grade II Tube type: Oral Tube size: 7.5 mm Number of attempts: 1 Airway Equipment and Method: Stylet and Oral airway Placement Confirmation: ETT inserted through vocal cords under direct vision,  positive ETCO2 and breath sounds checked- equal and bilateral Secured at: 23 cm Tube secured with: Tape Dental Injury: Teeth and Oropharynx as per pre-operative assessment

## 2016-01-11 NOTE — Anesthesia Preprocedure Evaluation (Addendum)
Anesthesia Evaluation  Patient identified by MRN, date of birth, ID band Patient awake    Reviewed: Allergy & Precautions, NPO status , Patient's Chart, lab work & pertinent test results  Airway Mallampati: II  TM Distance: >3 FB Neck ROM: Full    Dental  (+) Teeth Intact, Dental Advisory Given   Pulmonary    breath sounds clear to auscultation       Cardiovascular hypertension,  Rhythm:Regular Rate:Normal     Neuro/Psych    GI/Hepatic   Endo/Other    Renal/GU      Musculoskeletal   Abdominal   Peds  Hematology   Anesthesia Other Findings   Reproductive/Obstetrics                             Anesthesia Physical Anesthesia Plan  ASA: III  Anesthesia Plan: General   Post-op Pain Management:    Induction: Intravenous  Airway Management Planned: Oral ETT  Additional Equipment:   Intra-op Plan:   Post-operative Plan: Extubation in OR  Informed Consent: I have reviewed the patients History and Physical, chart, labs and discussed the procedure including the risks, benefits and alternatives for the proposed anesthesia with the patient or authorized representative who has indicated his/her understanding and acceptance.   Dental advisory given  Plan Discussed with: CRNA and Anesthesiologist  Anesthesia Plan Comments:         Anesthesia Quick Evaluation  

## 2016-01-11 NOTE — Transfer of Care (Signed)
Immediate Anesthesia Transfer of Care Note  Patient: Marvin Morrison  Procedure(s) Performed: Procedure(s) with comments: Lumbar two- three, Lumbar three- four, Lumbar four- five, Lumbar five-Sacral one Laminectomy (N/A) - L2-3 L3-4 L4-5 L5-S1 Laminectomy  Patient Location: PACU  Anesthesia Type:General  Level of Consciousness: patient cooperative, drowsy  Airway & Oxygen Therapy: Patient Spontanous Breathing and Patient connected to nasal cannula oxygen  Post-op Assessment: Report given to RN and Post -op Vital signs reviewed and stable  Post vital signs: Reviewed and stable  Last Vitals:  Vitals:   01/11/16 1052  BP: 111/62  Pulse: (!) 101  Resp: 18  Temp: 36.9 C    Last Pain:  Vitals:   01/11/16 1052  TempSrc: Oral      Patients Stated Pain Goal: 3 ([phone])  Complications: No apparent anesthesia complications

## 2016-01-11 NOTE — Op Note (Signed)
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 Discectomy L5-S1 right Surgeon: Kristeen Miss First assistant: Francesca Jewett M.D. Anesthesia: Gen. endotracheal Indications: Marvin Morrison is a 76 year old male who has had significant spondylosis and stenosis to the point where he's lost his ability to walk. He has numbness into both lower extremities. Is advised regarding need for surgery.  Procedure: Patient was brought to the operating room supine on a stretcher. After the smooth induction of general endotracheal anesthesia, he was turned prone. Back was prepped with alcohol DuraPrep and draped in a sterile fashion. A midline incision was created and carried down to the lumbar dorsal fashion the first spinous processes were noted to be that of L3 and L4. Then by dissecting up to L2 and dissecting down to L5 and a subperiosteal fashion lamina were exposed. The spinous processes were then removed moving the only the inferior margin of the spinous processes of L2 the complete spinous process of L3-L4 and L5. A laminectomy was then performed using a combination of rongeurs including Leksell's in Kerrison punches along with a high-speed drill to thin the laminar arches and remove them sequentially. Thickened redundant yellow ligament was taken up with a 2 and a 3 mm Kerrison punch and an initial central laminectomy was created. Then on each side laminectomy was widened out to expose the take off of the individual nerve roots starting at L2 superiorly and then L3 L4 L5 and the sacrum. This was first done on the right side than on the done on her left side carefully protecting the dura. Ultimately the laminectomy was widened adequately to decompress each of the individual nerve roots. While doing this exploration was noted to be  a significant mass dorsally under the L5-S1 disc space on the right side. This was found to be a substantial disc herniation. This was removed in a piecemeal fashion allowed for better decompression of the S1 nerve root.  During this portion of the procedure was noted to be some spinal fluid leakage from the undersurface of the dura. Is not clear where the tear was however when left alone the dura would not produce any spinal fluid. This was covered with some biologic adhesive at the L5-S1 site on the right side. No further leakage of spinal fluid was noted. Once decompression was completed hemostasis was established then the lumbar dorsal fascia was closed with #1 Vicryl interrupted fashion 2-0 Vicryl was used in subcutaneous tissues and 3-0 Vicryl close subcutaneous take her skin. Blood loss for the procedure was estimated 300 mL. The patient did receive 2 units packed red cell transfusion as his initial hemoglobin was already 9.

## 2016-01-11 NOTE — H&P (Signed)
CHIEF COMPLAINT:                                          Weakness in the legs, and inability to walk.  HISTORY OF PRESENT ILLNESS:                     [name] is a 76 year old right-handed individual who tells me that he initially presented with some pain in his back, but he notes that the big things was that his legs were weak.  He also found that his legs were painful, but now he notes that they are numb.  He has been on some prednisone, and currently he is taking 60 mg a day of prednisone as prescribed by Dr. Alain Marion, this was started around the beginning of December.  He brings with him an MRI of the lumbar spine which demonstrates that the patient has advanced spondylitic disease at multiple levels with severe stenosis involving L4-5, moderately severe stenosis at L3-4 and at L2-3, and a disc herniation at L5-S1 that is large and eccentric to the left side.  He tells me at this point that he is not having any back pain.  The big problem is that he can't stand independently, and he has not walked independently for at least a months' period of time.  He started to have to require a cane for balance, but now he notes that he can get around the house from bed to chair with the use of a walker, and this is the only upright posture that he can do.  He presents in a wheelchair.  REVIEW OF SYSTEMS:                                    His systems review is notable for swelling in his feet particularly, leg pain while walking, arthritis in the spine, back pain and leg pain on a 14 point review sheet.   PAST MEDICAL HISTORY:                                His past medical history reveals that he has generally had good health.  He does have some hypertension.  . Current Medical Conditions:  He notes that he feels there are swelling in the soles of his feet on both sides, and he notes that this is part in parcel of decreased sense of sensation in his lower extremities.     . Medications and  Allergies:  His medication list includes Acyclovir 400 mg twice daily, Gabapentin 300 mg three times a day,  Mupirocin Ointment, Ondansetron, Pantoprazole, Prednisone, Saw Palmetto, Tramadol, Triamcinolone cream, Verapamil 240 CR.   PHYSICAL EXAMINATION:                                On physical examination I note that the patient requires substantial help to stand in the erect position.  He does tend to favor a 20 degree forward stoop, with that, I note that he has strength in his tibialis anterior that is graded at 4-/5 bilaterally.  The gastrocnemius strength is graded at 4-/5.  The iliopsoas and the quadriceps are both graded at  4-/5.  The reflexes in the deep tendons of the patellae and the Achilles are both absent.  Sensation appears diminished distally in both lower extremities.  There is no evidence of any peripheral edema at the ankles. There is 1+ pulse in the tibialis anterior and the dorsalis pedis.    IMPRESSION:                                                   The patient has evidence of advanced spondylitic stenosis at multiple levels, including the L2-3, L3-4, L4-5, and L5-S1 levels.  I note on AP and lateral radiograph that we obtained in the office today, that his alignment in the coronal and sagittal planes is good, albeit, he has the loss of the normal lumbar lordosis.  He has advanced disc degenerative changes at L2-3, L3-4, L4-5, and L5-S1.  I suggested to him that it may be best to proceed with the smallest operation that we can do in an effort to try to get him some relief, and this would involve laminectomies and laminotomies at L2-3, L3-4, L4-5, and L5-S1.  A discectomy could be performed as necessary to provide adequate decompression of his central spinal elements, but given the fact that he has not having substantial pain at this time, consideration of a fusion could be deferred.  This is a much bigger operation, and I am not certain that it is absolutely necessary, albeit, I am  concerned that the pain factor may be being masked by the use of the prednisone.  In any event, I have suggested that doing the simple decompression would hopefully allow the nerves to recover so that he can walk more independently.  We would have to watch how his back progresses, and if he does develop significant deformity, then he may require a stabilization procedure subsequently.  In any event, at this point we would like to see what we can do to salvage function of his lower extremities so that he can walk independently.  The patient notes his goal would be to be able to walk his dog, he has not been able to do so with his current condition over the last couple of months.  It is also quite alarming that this condition has progressed in the past three months or so to the point where he can now not walk hardly at all.  We will try to expedite his surgical intervention.

## 2016-01-12 ENCOUNTER — Inpatient Hospital Stay (HOSPITAL_COMMUNITY): Payer: Medicare Other

## 2016-01-12 ENCOUNTER — Encounter (HOSPITAL_COMMUNITY): Payer: Self-pay | Admitting: Neurological Surgery

## 2016-01-12 DIAGNOSIS — L8990 Pressure ulcer of unspecified site, unspecified stage: Secondary | ICD-10-CM | POA: Insufficient documentation

## 2016-01-12 DIAGNOSIS — M48062 Spinal stenosis, lumbar region with neurogenic claudication: Secondary | ICD-10-CM

## 2016-01-12 DIAGNOSIS — E440 Moderate protein-calorie malnutrition: Secondary | ICD-10-CM | POA: Insufficient documentation

## 2016-01-12 DIAGNOSIS — D500 Iron deficiency anemia secondary to blood loss (chronic): Secondary | ICD-10-CM

## 2016-01-12 LAB — URINALYSIS, ROUTINE W REFLEX MICROSCOPIC
Bilirubin Urine: NEGATIVE
GLUCOSE, UA: NEGATIVE mg/dL
Ketones, ur: NEGATIVE mg/dL
Nitrite: NEGATIVE
PH: 5 (ref 5.0–8.0)
Protein, ur: 30 mg/dL — AB
Specific Gravity, Urine: 1.015 (ref 1.005–1.030)
Squamous Epithelial / LPF: NONE SEEN

## 2016-01-12 LAB — TYPE AND SCREEN
ABO/RH(D): A NEG
ANTIBODY SCREEN: NEGATIVE
Unit division: 0
Unit division: 0

## 2016-01-12 LAB — COMPREHENSIVE METABOLIC PANEL
ALBUMIN: 2.6 g/dL — AB (ref 3.5–5.0)
ALT: 18 U/L (ref 17–63)
AST: 19 U/L (ref 15–41)
Alkaline Phosphatase: 46 U/L (ref 38–126)
Anion gap: 8 (ref 5–15)
BUN: 21 mg/dL — ABNORMAL HIGH (ref 6–20)
CHLORIDE: 102 mmol/L (ref 101–111)
CO2: 28 mmol/L (ref 22–32)
CREATININE: 1.68 mg/dL — AB (ref 0.61–1.24)
Calcium: 7.8 mg/dL — ABNORMAL LOW (ref 8.9–10.3)
GFR calc Af Amer: 44 mL/min — ABNORMAL LOW (ref >60)
GFR calc non Af Amer: 38 mL/min — ABNORMAL LOW (ref >60)
GLUCOSE: 88 mg/dL (ref 65–99)
POTASSIUM: 2.7 mmol/L — AB (ref 3.5–5.1)
Sodium: 138 mmol/L (ref 135–145)
Total Bilirubin: 0.5 mg/dL (ref 0.3–1.2)
Total Protein: 4.4 g/dL — ABNORMAL LOW (ref 6.5–8.1)

## 2016-01-12 LAB — BASIC METABOLIC PANEL
ANION GAP: 10 (ref 5–15)
BUN: 23 mg/dL — ABNORMAL HIGH (ref 6–20)
CALCIUM: 7.6 mg/dL — AB (ref 8.9–10.3)
CO2: 28 mmol/L (ref 22–32)
Chloride: 98 mmol/L — ABNORMAL LOW (ref 101–111)
Creatinine, Ser: 1.76 mg/dL — ABNORMAL HIGH (ref 0.61–1.24)
GFR calc non Af Amer: 36 mL/min — ABNORMAL LOW (ref >60)
GFR, EST AFRICAN AMERICAN: 41 mL/min — AB (ref >60)
Glucose, Bld: 201 mg/dL — ABNORMAL HIGH (ref 65–99)
POTASSIUM: 3.6 mmol/L (ref 3.5–5.1)
Sodium: 136 mmol/L (ref 135–145)

## 2016-01-12 MED ORDER — POTASSIUM CHLORIDE CRYS ER 20 MEQ PO TBCR
40.0000 meq | EXTENDED_RELEASE_TABLET | Freq: Two times a day (BID) | ORAL | Status: DC
  Administered 2016-01-12 – 2016-01-13 (×3): 40 meq via ORAL
  Filled 2016-01-12 (×3): qty 2

## 2016-01-12 MED ORDER — BOOST PLUS PO LIQD
237.0000 mL | Freq: Three times a day (TID) | ORAL | Status: DC
  Administered 2016-01-12 – 2016-01-13 (×3): 237 mL via ORAL
  Filled 2016-01-12 (×7): qty 237

## 2016-01-12 MED ORDER — POTASSIUM CHLORIDE CRYS ER 20 MEQ PO TBCR
40.0000 meq | EXTENDED_RELEASE_TABLET | Freq: Two times a day (BID) | ORAL | Status: DC
  Filled 2016-01-12: qty 2

## 2016-01-12 MED ORDER — SODIUM CHLORIDE 0.9 % IV SOLN
30.0000 meq | Freq: Once | INTRAVENOUS | Status: AC
  Administered 2016-01-12: 30 meq via INTRAVENOUS
  Filled 2016-01-12: qty 15

## 2016-01-12 MED ORDER — COLLAGENASE 250 UNIT/GM EX OINT
TOPICAL_OINTMENT | Freq: Every day | CUTANEOUS | Status: DC
  Administered 2016-01-12 – 2016-01-13 (×2): via TOPICAL
  Filled 2016-01-12: qty 30

## 2016-01-12 MED ORDER — MAGNESIUM SULFATE IN D5W 1-5 GM/100ML-% IV SOLN
1.0000 g | Freq: Once | INTRAVENOUS | Status: AC
  Administered 2016-01-12: 1 g via INTRAVENOUS
  Filled 2016-01-12: qty 100

## 2016-01-12 MED ORDER — ADULT MULTIVITAMIN W/MINERALS CH
1.0000 | ORAL_TABLET | Freq: Every day | ORAL | Status: DC
  Administered 2016-01-12 – 2016-01-13 (×2): 1 via ORAL
  Filled 2016-01-12 (×2): qty 1

## 2016-01-12 MED ORDER — POTASSIUM CHLORIDE CRYS ER 20 MEQ PO TBCR: 40.0000 meq | EXTENDED_RELEASE_TABLET | Freq: Two times a day (BID) | ORAL | Status: DC

## 2016-01-12 MED ORDER — POTASSIUM CHLORIDE IN NACL 40-0.9 MEQ/L-% IV SOLN
INTRAVENOUS | Status: DC
  Administered 2016-01-12 – 2016-01-13 (×2): 75 mL/h via INTRAVENOUS
  Filled 2016-01-12 (×5): qty 1000

## 2016-01-12 MED ORDER — POTASSIUM CHLORIDE CRYS ER 20 MEQ PO TBCR
40.0000 meq | EXTENDED_RELEASE_TABLET | Freq: Three times a day (TID) | ORAL | Status: DC
Start: 2016-01-12 — End: 2016-01-12

## 2016-01-12 NOTE — Progress Notes (Signed)
Patient ID: Marvin Morrison, male   DOB: 01-15-1940, 76 y.o.   MRN: [id number] Appreciate help of consultants.  Would be excellent candidate for inpatient rehab. Mobilization is very limited. Continue inpatient therapies

## 2016-01-12 NOTE — Care Management Note (Signed)
Case Management Note  Patient Details  Name: SHLOMO BRYS MRN: [id number] Date of Birth: 10/15/1940  Subjective/Objective:           Patient was admitted with lower extremity weakness, inability to ambulate.  Was wheelchair bound at home due to rapidly progressing weakness over the last 3 months. Patient underwent a Lumbar two- three, Lumbar three- four, Lumbar four- five, Lumbar five-Sacral one Laminectomy. CM will follow for discharge needs pending PT/OT evals and physician orders.  Action/Plan:   Expected Discharge Date:                  Expected Discharge Plan:     In-House Referral:     Discharge planning Services     Post Acute Care Choice:    Choice offered to:     DME Arranged:    DME Agency:     HH Arranged:    HH Agency:     Status of Service:     If discussed at [employer] of Stay Meetings, dates discussed:    Additional Comments:  Rolm Baptise, RN 01/12/2016, 9:52 AM

## 2016-01-12 NOTE — Anesthesia Postprocedure Evaluation (Addendum)
Anesthesia Post Note  Patient: Marvin Morrison  Procedure(s) Performed: Procedure(s) (LRB): Lumbar two- three, Lumbar three- four, Lumbar four- five, Lumbar five-Sacral one Laminectomy (N/A)  Patient location during evaluation: PACU Anesthesia Type: General Level of consciousness: awake, awake and alert and oriented Pain management: pain level controlled Vital Signs Assessment: post-procedure vital signs reviewed and stable Respiratory status: spontaneous breathing, nonlabored ventilation and respiratory function stable Cardiovascular status: blood pressure returned to baseline Anesthetic complications: no       Last Vitals:  Vitals:   01/12/16 0019 01/12/16 0511  BP: (!) 124/58 132/71  Pulse: 83 (!) 103  Resp: 16 18  Temp: 36.7 C 37 C    Last Pain:  Vitals:   01/12/16 0511  TempSrc: Oral  PainSc:                  Tammela Bales COKER

## 2016-01-12 NOTE — Evaluation (Signed)
Physical Therapy Evaluation Patient Details Name: Marvin Morrison MRN: [id number] DOB: 25-Oct-1940 Today's Date: 01/12/2016   History of Present Illness  Marvin Morrison is an 76 y.o. 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  Found to be hypokalemic, malnurished and with HTN.  Clinical Impression  Patient presents with LE weakness, poor proprioception, pain and back precautions limiting independence with mobility and he will benefit from skilled PT in the acute setting to allow return home with family support following CIR level rehab.    Follow Up Recommendations CIR    Equipment Recommendations  None recommended by PT    Recommendations for Other Services Rehab consult     Precautions / Restrictions Precautions Precautions: Fall;Back Precaution Booklet Issued: No Required Braces or Orthoses:  (no brace) Restrictions Weight Bearing Restrictions: No      Mobility  Bed Mobility Overal bed mobility: Needs Assistance Bed Mobility: Rolling;Sidelying to Sit;Sit to Sidelying Rolling: Supervision Sidelying to sit: Min assist     Sit to sidelying: Mod assist General bed mobility comments: cues for technique and assist to lift trunk; to side assist for legs into bed and positioning  Transfers Overall transfer level: Needs assistance Equipment used: Rolling walker (2 wheeled) Transfers: Sit to/from Stand Sit to Stand: From elevated surface;Min assist         General transfer comment: assist for balance; raised height of bed as has high bed at home  Ambulation/Gait Ambulation/Gait assistance: Min assist;Mod assist Ambulation Distance (Feet): 22 Feet Assistive device: Rolling walker (2 wheeled) Gait Pattern/deviations: Step-through pattern;Trunk flexed;Shuffle;Decreased stride length;Wide base of support     General Gait  Details: cues throughout for trunk extension; pt self limited due to LE fatigue, relies heavily on UE's on walker for ambulation in room  Stairs            Wheelchair Mobility    Modified Rankin (Stroke Patients Only)       Balance Overall balance assessment: Needs assistance   Sitting balance-Leahy Scale: Good     Standing balance support: Bilateral upper extremity supported Standing balance-Leahy Scale: Poor Standing balance comment: assist with UE support on walker for balance                             Pertinent Vitals/Pain Pain Assessment: Faces Faces Pain Scale: Hurts even more Pain Location: back and R leg with movement Pain Descriptors / Indicators: Aching Pain Intervention(s): Limited activity within patient's tolerance;Repositioned;Monitored during session;Premedicated before session    Home Living Family/patient expects to be discharged to:: Private residence Living Arrangements: Spouse/significant other Available Help at Discharge: Family Type of Home: House Home Access: Stairs to enter Entrance Stairs-Rails: None Entrance Stairs-Number of Steps: 2 Home Layout: Two level;Bed/bath upstairs Home Equipment: Walker - 2 wheels;Cane - single point;Shower seat      Prior Function Level of Independence: Needs assistance   Gait / Transfers Assistance Needed: reports could go to computer room with walker from his bed with walker; had to have help (carried) up stairs  ADL's / Homemaking Assistance Needed: wife assist with bathing and dressing        Hand Dominance        Extremity/Trunk Assessment   Upper Extremity Assessment Upper Extremity Assessment: Defer to OT evaluation    Lower Extremity Assessment Lower  Extremity Assessment: Generalized weakness;LLE deficits/detail;RLE deficits/detail RLE Deficits / Details: able to lift antigravity, but functionally very weak, strength not formally tested RLE Sensation: decreased light  touch;decreased proprioception LLE Deficits / Details: able to lift antigravity, but functionally very weak, strength not formally tested LLE Sensation: decreased light touch;decreased proprioception    Cervical / Trunk Assessment Cervical / Trunk Assessment: Other exceptions Cervical / Trunk Exceptions: flexed posture in standing, and post pelvic tilt sitting  Communication   Communication: No difficulties  Cognition Arousal/Alertness: Awake/alert Behavior During Therapy: Anxious Overall Cognitive Status: Within Functional Limits for tasks assessed                      General Comments General comments (skin integrity, edema, etc.): Patient not wanting to get up due to just had pain meds and feeling sleepy.  Much encouragement to participate with OT & PT available, in room    Exercises     Assessment/Plan    PT Assessment Patient needs continued PT services  PT Problem List Decreased strength;Decreased activity tolerance;Decreased balance;Decreased knowledge of use of DME;Decreased mobility;Impaired sensation;Decreased safety awareness;Pain          PT Treatment Interventions DME instruction;Gait training;Therapeutic activities;Therapeutic exercise;Stair training;Functional mobility training;Patient/family education    PT Goals (Current goals can be found in the Care Plan section)  Acute Rehab PT Goals Patient Stated Goal: To walk his dog PT Goal Formulation: With patient Time For Goal Achievement: 01/19/16 Potential to Achieve Goals: Fair    Frequency Min 5X/week   Barriers to discharge        Co-evaluation PT/OT/SLP Co-Evaluation/Treatment: Yes Reason for Co-Treatment: For patient/therapist safety PT goals addressed during session: Balance;Mobility/safety with mobility         End of Session Equipment Utilized During Treatment: Gait belt Activity Tolerance: Patient limited by fatigue Patient left: in bed;with call bell/phone within reach;with bed  alarm set Nurse Communication: Mobility status         Time: [id number] PT Time Calculation (min) (ACUTE ONLY): 27 min   Charges:   PT Evaluation $PT Eval Low Complexity: 1 Procedure $PT Eval High Complexity: 1 Procedure     PT G CodesReginia Naas January 25, 2016, 11:19 AM  Magda Kiel, PT [phone] 2016-01-25

## 2016-01-12 NOTE — Consult Note (Signed)
Physical Medicine and Rehabilitation Consult Reason for Consult: Lumbar radiculopathy Referring Physician: [facility]    HPI: Marvin Morrison is a 76 y.o. right handed male with history of hypertension, chronic renal insufficiency stage III creatinine. per chart review patient lives with wife. He has essentially been wheelchair and bed bound for 3 months with back pain radiating to the lower extremities as well as bilateral buttock and sacral ulcer. His wife works during the day. 2 level home with bedroom upstairs. 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 Underwent laminectomy L-2-L5 with decompression of L2-3-4 and L5 and S1 nerve roots. Discectomy L5-S1 01/11/2016 per Dr. Ellene Route. Hospital course pain management.WOC follow-up of skin care of decubitus. No plan for back bracing. Acute blood loss anemia 9.1 and monitored. Physical therapy evaluation completed with recommendations of physical medicine rehabilitation consult.   Review of Systems  Constitutional: Negative for chills and fever.  HENT: Negative for hearing loss and tinnitus.   Eyes: Negative for blurred vision.  Respiratory: Positive for shortness of breath. Negative for cough.   Cardiovascular: Positive for leg swelling. Negative for chest pain and palpitations.  Gastrointestinal: Positive for constipation. Negative for nausea and vomiting.  Genitourinary: Positive for urgency. Negative for dysuria, flank pain and hematuria.  Musculoskeletal: Positive for back pain, falls and myalgias.  Skin: Negative for rash.  Neurological: Positive for weakness. Negative for seizures and loss of consciousness.  All other systems reviewed and are negative.  Past Medical History:  Diagnosis Date  . Arthritis   . Chronic kidney disease   . Colitis   . Diarrhea   . Eczema   . Elevated glucose   .  HTN (hypertension)   . Hyperlipidemia   . Pneumonia   . Shingles 2009   Past Surgical History:  Procedure Laterality Date  . TONSILLECTOMY     Age 49   Family History  Problem Relation Age of Onset  . Hypertension Mother   . Hyperlipidemia Father   . Hypertension Father    Social History:  reports that he has never smoked. He has never used smokeless tobacco. He reports that he does not drink alcohol or use drugs. Allergies:  Allergies  Allergen Reactions  . Gabapentin Other (See Comments)    DIZZY FALLS    Medications Prior to Admission  Medication Sig Dispense Refill  . acyclovir (ZOVIRAX) 400 MG tablet Take 1 tablet (400 mg total) by mouth 2 (two) times daily. 60 tablet 11  . ferrous sulfate 325 (65 FE) MG tablet TAKE 1 TABLET BY MOUTH EVERY DAY 30 tablet 5  . ondansetron (ZOFRAN) 4 MG tablet TAKE 1 TABLET BY MOUTH EVERY 8 HOURS AS NEEDED FOR NAUSEA AND VOMITING 30 tablet 5  . pantoprazole (PROTONIX) 40 MG tablet Take 1 tablet (40 mg total) by mouth daily. 90 tablet 2  . predniSONE (DELTASONE) 20 MG tablet Take 2-3 tablets (40-60 mg total) by mouth daily with breakfast. (Patient taking differently: Take 60 mg by mouth daily with breakfast. ) 100 tablet 1  . traMADol (ULTRAM) 50 MG tablet Take 1-2 tablets (50-100 mg total) by mouth every 8 (eight) hours as needed. (Patient taking differently: Take 50-100 mg by mouth every 8 (eight) hours as needed for moderate pain. ) 120 tablet 2  . triamcinolone ointment (KENALOG) 0.1 % APPLY TO AFFECTED AREA TWICE DAILY (Patient taking differently: APPLY TO AFFECTED  AREA ONCE WEEKLY) 80 g 2  . verapamil (CALAN-SR) 240 MG CR tablet Take 1 tablet (240 mg total) by mouth at bedtime. 90 tablet 2  . mupirocin ointment (BACTROBAN) 2 % APPLY TOPICALLY DAILY. (Patient not taking: Reported on 01/11/2016) 22 g 2    Home: [place] expects to be discharged to:: Private residence Living Arrangements: Spouse/significant other Available  Help at Discharge: Family Type of Home: House Home Access: Stairs to enter Technical brewer of Steps: 2 Entrance Stairs-Rails: None Home Layout: Two level, Bed/bath upstairs Alternate Level Stairs-Number of Steps: 14 Alternate Level Stairs-Rails: Right Bathroom Shower/Tub: Multimedia programmer: Standard Home Equipment: Environmental consultant - 2 wheels, [employer] - single point, Careers adviser History: Prior Function Level of Independence: Needs assistance Gait / Transfers Assistance Needed: reports could go to computer room with walker from his bed with walker; had to have help (carried) up stairs ADL's / Homemaking Assistance Needed: wife assist with bathing and dressing Functional Status:  Mobility: Bed Mobility Overal bed mobility: Needs Assistance Bed Mobility: Rolling, Sidelying to Sit, Sit to Sidelying Rolling: Supervision Sidelying to sit: Min assist Sit to sidelying: Mod assist General bed mobility comments: cues for technique and assist to lift trunk; to side assist for legs into bed and positioning Transfers Overall transfer level: Needs assistance Equipment used: Rolling walker (2 wheeled) Transfers: Sit to/from Stand Sit to Stand: From elevated surface, Min assist General transfer comment: assist for balance; raised height of bed as has high bed at home Ambulation/Gait Ambulation/Gait assistance: Min assist, Mod assist Ambulation Distance (Feet): 22 Feet Assistive device: Rolling walker (2 wheeled) Gait Pattern/deviations: Step-through pattern, Trunk flexed, Shuffle, Decreased stride length, Wide base of support General Gait Details: cues throughout for trunk extension; pt self limited due to LE fatigue, relies heavily on UE's on walker for ambulation in room    ADL:    Cognition: Cognition Overall Cognitive Status: Within Functional Limits for tasks assessed Orientation Level: Oriented X4 Cognition Arousal/Alertness: Awake/alert Behavior During  Therapy: Anxious Overall Cognitive Status: Within Functional Limits for tasks assessed  Blood pressure (!) 108/52, pulse 88, temperature 98.8 F (37.1 C), temperature source Oral, resp. rate 20, height 6' (1.829 m), weight 75.7 kg (166 lb 14.2 oz), SpO2 96 %. Physical Exam  Neurological:  UE strength 5/5. RLE: 4/5 HF,KE, ADF/PF. LLE: 4-/5 HF, 4/5 KE and 3/5 APF, 1+ADF. Decreased sensation below ankle left foot. Intact sensation RLE.   Skin: Skin is warm.  Back incision is dressed. Patient would not turn enough in the bed to examine sacral decubitus due to pain  Psychiatric: He has a normal mood and affect. His behavior is normal. Judgment and thought content normal.    Results for orders placed or performed during the hospital encounter of 01/11/16 (from the past 24 hour(s))  Basic metabolic panel     Status: Abnormal   Collection Time: 01/11/16 12:18 PM  Result Value Ref Range   Sodium 137 135 - 145 mmol/L   Potassium 2.5 (LL) 3.5 - 5.1 mmol/L   Chloride 101 101 - 111 mmol/L   CO2 26 22 - 32 mmol/L   Glucose, Bld 87 65 - 99 mg/dL   BUN 25 (H) 6 - 20 mg/dL   Creatinine, Ser 1.86 (H) 0.61 - 1.24 mg/dL   Calcium 8.5 (L) 8.9 - 10.3 mg/dL   GFR calc non Af Amer 33 (L) >60 mL/min   GFR calc Af Amer 39 (L) >60 mL/min   Anion gap  10 5 - 15  CBC     Status: Abnormal   Collection Time: 01/11/16 12:18 PM  Result Value Ref Range   WBC 17.4 (H) 4.0 - 10.5 K/uL   RBC 2.75 (L) 4.22 - 5.81 MIL/uL   Hemoglobin 9.1 (L) 13.0 - 17.0 g/dL   HCT 26.3 (L) 39.0 - 52.0 %   MCV 95.6 78.0 - 100.0 fL   MCH 33.1 26.0 - 34.0 pg   MCHC 34.6 30.0 - 36.0 g/dL   RDW 15.2 11.5 - 15.5 %   Platelets 279 150 - 400 K/uL  Surgical pcr screen     Status: None   Collection Time: 01/11/16 12:18 PM  Result Value Ref Range   MRSA, PCR NEGATIVE NEGATIVE   Staphylococcus aureus NEGATIVE NEGATIVE  Prepare RBC     Status: None   Collection Time: 01/11/16  4:00 PM  Result Value Ref Range   Order Confirmation ORDER  PROCESSED BY BLOOD BANK   Type and screen     Status: None   Collection Time: 01/11/16  4:00 PM  Result Value Ref Range   ABO/RH(D) A NEG    Antibody Screen NEG    Sample Expiration 01/14/2016    Unit Number [id number]    Blood Component Type RED CELLS,LR    Unit division 00    Status of Unit ISSUED,FINAL    Transfusion Status OK TO TRANSFUSE    Crossmatch Result Compatible    Unit Number [id number]    Blood Component Type RED CELLS,LR    Unit division 00    Status of Unit ISSUED,FINAL    Transfusion Status OK TO TRANSFUSE    Crossmatch Result Compatible   ABO/Rh     Status: None   Collection Time: 01/11/16  4:00 PM  Result Value Ref Range   ABO/RH(D) A NEG   Magnesium     Status: None   Collection Time: 01/11/16  6:05 PM  Result Value Ref Range   Magnesium 1.7 1.7 - 2.4 mg/dL  Basic metabolic panel     Status: Abnormal   Collection Time: 01/11/16 11:27 PM  Result Value Ref Range   Sodium 136 135 - 145 mmol/L   Potassium 3.6 3.5 - 5.1 mmol/L   Chloride 98 (L) 101 - 111 mmol/L   CO2 28 22 - 32 mmol/L   Glucose, Bld 201 (H) 65 - 99 mg/dL   BUN 23 (H) 6 - 20 mg/dL   Creatinine, Ser 1.76 (H) 0.61 - 1.24 mg/dL   Calcium 7.6 (L) 8.9 - 10.3 mg/dL   GFR calc non Af Amer 36 (L) >60 mL/min   GFR calc Af Amer 41 (L) >60 mL/min   Anion gap 10 5 - 15  Comprehensive metabolic panel     Status: Abnormal   Collection Time: 01/12/16  5:04 AM  Result Value Ref Range   Sodium 138 135 - 145 mmol/L   Potassium 2.7 (LL) 3.5 - 5.1 mmol/L   Chloride 102 101 - 111 mmol/L   CO2 28 22 - 32 mmol/L   Glucose, Bld 88 65 - 99 mg/dL   BUN 21 (H) 6 - 20 mg/dL   Creatinine, Ser 1.68 (H) 0.61 - 1.24 mg/dL   Calcium 7.8 (L) 8.9 - 10.3 mg/dL   Total Protein 4.4 (L) 6.5 - 8.1 g/dL   Albumin 2.6 (L) 3.5 - 5.0 g/dL   AST 19 15 - 41 U/L   ALT 18 17 - 63 U/L   Alkaline Phosphatase  46 38 - 126 U/L   Total Bilirubin 0.5 0.3 - 1.2 mg/dL   GFR calc non Af Amer 38 (L) >60 mL/min   GFR calc Af Amer  44 (L) >60 mL/min   Anion gap 8 5 - 15   Dg Lumbar Spine 2-3 Views  Result Date: 01/11/2016 CLINICAL DATA:  Lumbar spine surgery. EXAM: LUMBAR SPINE - 2-3 VIEW COMPARISON:  12/30/2015 and MRI 11/28/2015 FINDINGS: Two cross-table lateral views of the lumbar spine were obtained. Again noted is multilevel disc disease with marked disc space narrowing at L3-L4. The image labeled #1 demonstrates surgical hardware posterior to L3-L4. The second image has a surgical marker posterior to L2 and S1. IMPRESSION: Surgical marking for lumbar spine surgery. Electronically Signed   By: Markus Daft M.D.   On: 01/11/2016 19:12   Dg Chest Port 1 View  Result Date: 01/12/2016 CLINICAL DATA:  Elevated white blood cell count postop lumbar spine surgery EXAM: PORTABLE CHEST 1 VIEW COMPARISON:  Chest x-ray of 04/18/2014 FINDINGS: The lungs are not well aerated and there is subtle nodularity throughout the lungs. Inflammatory or infectious process cannot be excluded and followup two-view chest x-ray or CT the chest is recommended. No effusion is seen. Mediastinal and hilar contours are unremarkable. The heart is within normal limits in size. No bony abnormality is seen. IMPRESSION: Vague nodularity throughout the lungs could being infectious or inflammatory and followup two-view chest x-ray or CT of the chest is recommended Electronically Signed   By: Ivar Drape M.D.   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 Does the need for close, 24 hr/day medical supervision in concert with the patient's rehab needs make it unreasonable for this patient to be served in a less intensive setting? Yes 2. Co-Morbidities requiring supervision/potential complications: htn, anemia, pain 3. Due to bladder management, bowel management, safety, skin/wound care, disease management, medication administration, pain management and patient education,  does the patient require 24 hr/day rehab nursing? Yes 4. Does the patient require coordinated care of a physician, rehab nurse, PT (1-2 hrs/day, 5 days/week) and OT (1-2 hrs/day, 5 days/week) to address physical and functional deficits in the context of the above medical diagnosis(es)? Yes Addressing deficits in the following areas: balance, endurance, locomotion, strength, transferring, bowel/bladder control, bathing, dressing, feeding, grooming, toileting and psychosocial support 5. Can the patient actively participate in an intensive therapy program of at least 3 hrs of therapy per day at least 5 days per week? Yes 6. The potential for patient to make measurable gains while on inpatient rehab is excellent 7. Anticipated functional outcomes upon discharge from inpatient rehab are modified independent  with PT, modified independent with OT, n/a with SLP. 8. Estimated rehab length of stay to reach the above functional goals is: 7-11 days 9. Does the patient have adequate social supports and living environment to accommodate these discharge functional goals? Yes 10. Anticipated D/C setting: Home 11. Anticipated post D/C treatments: HH therapy and Outpatient therapy 12. Overall Rehab/Functional Prognosis: excellent  RECOMMENDATIONS: This patient's condition is appropriate for continued rehabilitative care in the following setting: CIR Patient has agreed to participate in recommended program. Yes Note that insurance prior authorization may be required for reimbursement for recommended care.  Comment: Rehab Admissions Coordinator to follow up.  Thanks,  Meredith Staggers, MD, Mellody Drown    Cathlyn Parsons., PA-C 01/12/2016

## 2016-01-12 NOTE — Progress Notes (Signed)
Critical Potassium of 2.7. MD notified with order of 3 runs of potassium IV and 71meq potassium PO.

## 2016-01-12 NOTE — Progress Notes (Signed)
Clinical Social Worker met patient at discharge to offer support and discuss patients needs at discharge. Patients wife and daughter were present during assessment. Patients was rude, agressive and loud during assessment. Patient stated he does not want SNF placement and would prefer CIR placement. CSW explained to patient SNF placement would be a back up to CIR but patient became irratated and stated "if he doesn't get into CIR he wants to go home with Home Health". Patient was aggitated and stated "why would CIR see him if its not guaranteed  he is going". CSW tired explaining to both patient and wife the process but patient did not want to hear it. Patients daughter tired to calm patient down and explain the situation but still patient did not want to hear it. Patient is denying SNFand at this time CSW is signing off on patient.  Rhea Pink, MSW,  [place]

## 2016-01-12 NOTE — Progress Notes (Signed)
Patient's foley catheter was removed in the morning. The patient is now pass the 6 hour window to urinate. Pt was bladder scanned; volume of the bladder scan was 343 ML. MD was page notified.

## 2016-01-12 NOTE — Evaluation (Signed)
Occupational Therapy Evaluation Patient Details Name: Marvin Morrison MRN: [id number] DOB: 02/19/1940 Today's Date: 01/12/2016    History of Present Illness Marvin Morrison is an 76 y.o. 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  Found to be hypokalemic, malnurished and with HTN.   Clinical Impression   PTA, pt was using RW for functional mobility and required assistance for dressing/bathing due to back pain. Pt presents with decreased safety awareness for use of RW and requiring max assist for LB ADL and min-mod assist for functional toilet transfers. Educated pt on back precautions during ADL and pt requiring consistent VC's to maintain. Pt would benefit from continued OT services while admitted to improve independence with ADL and functional mobility. Recommend CIR placement for continued rehabilitation post-acute D/C in order to maximize independence and safety with ADL.    Follow Up Recommendations  CIR    Equipment Recommendations  Other (comment) (TBD at next venue)    Recommendations for Other Services Rehab consult     Precautions / Restrictions Precautions Precautions: Fall;Back Precaution Booklet Issued: No Precaution Comments: Verbally reviewed with pt Required Braces or Orthoses:  (No brace) Restrictions Weight Bearing Restrictions: No      Mobility Bed Mobility Overal bed mobility: Needs Assistance Bed Mobility: Rolling;Sidelying to Sit;Sit to Sidelying Rolling: Supervision Sidelying to sit: Min assist     Sit to sidelying: Mod assist General bed mobility comments: cues for technique and assist to lift trunk; to side assist for legs into bed and positioning  Transfers Overall transfer level: Needs assistance Equipment used: Rolling walker (2 wheeled) Transfers: Sit to/from Stand Sit to Stand: From elevated  surface;Min assist         General transfer comment: assist for balance; raised height of bed as has high bed at home    Balance Overall balance assessment: Needs assistance Sitting-balance support: Feet supported;No upper extremity supported Sitting balance-Leahy Scale: Good     Standing balance support: Bilateral upper extremity supported Standing balance-Leahy Scale: Poor Standing balance comment: assist with UE support on walker for balance                            ADL Overall ADL's : Needs assistance/impaired     Grooming: Set up;Sitting;Supervision/safety   Upper Body Bathing: Minimal assistance;Cueing for safety;Sitting   Lower Body Bathing: Maximal assistance;Sit to/from stand   Upper Body Dressing : Minimal assistance;Sitting   Lower Body Dressing: Maximal assistance;Sit to/from stand   Toilet Transfer: Minimal assistance;Moderate assistance;Ambulation;RW;+2 for safety/equipment   Toileting- Clothing Manipulation and Hygiene: Moderate assistance;Sit to/from stand       Functional mobility during ADLs: Minimal assistance;Moderate assistance;+2 for safety/equipment;Rolling walker General ADL Comments: Pt educated on back precautions during ADL. Walking with back bent and would straighten when cues given but pt returned to incorrect posture regularly.     Vision Vision Assessment?: No apparent visual deficits   Perception     Praxis      Pertinent Vitals/Pain Pain Assessment: Faces Faces Pain Scale: Hurts even more Pain Location: back and R leg with movement Pain Descriptors / Indicators: Aching Pain Intervention(s): Limited activity within patient's tolerance;Repositioned;Monitored during session;Premedicated before session     Hand Dominance Right   Extremity/Trunk Assessment Upper Extremity Assessment Upper Extremity Assessment: Generalized weakness   Lower Extremity Assessment  Lower Extremity Assessment: Defer to PT  evaluation RLE Deficits / Details: able to lift antigravity, but functionally very weak, strength not formally tested RLE Sensation: decreased light touch;decreased proprioception LLE Deficits / Details: able to lift antigravity, but functionally very weak, strength not formally tested LLE Sensation: decreased light touch;decreased proprioception   Cervical / Trunk Assessment Cervical / Trunk Assessment: Other exceptions Cervical / Trunk Exceptions: flexed posture in standing, and post pelvic tilt sitting   Communication Communication Communication: No difficulties   Cognition Arousal/Alertness: Awake/alert Behavior During Therapy: Anxious Overall Cognitive Status: Within Functional Limits for tasks assessed                     General Comments       Exercises       Shoulder Instructions      Home Living Family/patient expects to be discharged to:: Private residence Living Arrangements: Spouse/significant other Available Help at Discharge: Family Type of Home: House Home Access: Stairs to enter Technical brewer of Steps: 2 Entrance Stairs-Rails: None Home Layout: Two level;Bed/bath upstairs Alternate Level Stairs-Number of Steps: 14 Alternate Level Stairs-Rails: Right Bathroom Shower/Tub: Occupational psychologist: Standard Bathroom Accessibility: Yes How Accessible: Accessible via walker Home Equipment: [place] - 2 wheels;Cane - single point;Shower seat   Additional Comments: Pt has been staying upstairs and having people carry him up and down the stairs.      Prior Functioning/Environment Level of Independence: Needs assistance  Gait / Transfers Assistance Needed: reports could go to computer room with walker from his bed with walker; had to have help (carried) up stairs ADL's / Homemaking Assistance Needed: wife assist with bathing and dressing            OT Problem List: Decreased strength;Decreased range of motion;Decreased activity  tolerance;Impaired balance (sitting and/or standing);Decreased safety awareness;Decreased knowledge of use of DME or AE;Decreased knowledge of precautions;Pain   OT Treatment/Interventions: Self-care/ADL training;Therapeutic exercise;Energy conservation;DME and/or AE instruction;Therapeutic activities;Patient/family education;Balance training    OT Goals(Current goals can be found in the care plan section) Acute Rehab OT Goals Patient Stated Goal: To walk his dog OT Goal Formulation: With patient Time For Goal Achievement: 01/19/16 Potential to Achieve Goals: Good ADL Goals Pt Will Perform Lower Body Bathing: with supervision;sit to/from stand Pt Will Perform Lower Body Dressing: with supervision;sit to/from stand Pt Will Transfer to Toilet: with supervision;ambulating;bedside commode (BSC over toilet) Pt Will Perform Toileting - Clothing Manipulation and hygiene: with supervision;sit to/from stand Pt Will Perform Tub/Shower Transfer: with supervision;Shower transfer;shower seat;rolling walker  OT Frequency: Min 2X/week   Barriers to D/C:            Co-evaluation PT/OT/SLP Co-Evaluation/Treatment: Yes Reason for Co-Treatment: For patient/therapist safety PT goals addressed during session: Balance;Mobility/safety with mobility OT goals addressed during session: ADL's and self-care      End of Session Equipment Utilized During Treatment: Gait belt;Rolling walker Nurse Communication: Mobility status  Activity Tolerance: Patient tolerated treatment well Patient left: in bed;with call bell/phone within reach;with SCD's reapplied   Time: 0932-1000 OT Time Calculation (min): 28 min Charges:  OT General Charges $OT Visit: 1 Procedure OT Evaluation $OT Eval Moderate Complexity: 1 Procedure  Kassabian Herrlich, OTR/L [phone] 01/12/2016, 1:00 PM

## 2016-01-12 NOTE — Consult Note (Addendum)
[facility] Nurse wound consult note Reason for Consult: Consult requested for bilat buttocks and sacrum area.  Family members at the bedside states these wounds were present prior to admission, but appear to have declined. Location appears to be frequently moist, inner gluteal fold with partial thickness skin loss fissure related to moisture assiocated skin damage. Wound type: Left and right buttocks each have a wound; 5X2X.1cm; 30% of the wounds is stage 2 pressure injurry, 70% is yellow slough in patchy areas of unstageable pressure injury Pressure Injury POA: Yes Drainage (amount, consistency, odor) Mod amt yellow drainage, no odor Dressing procedure/placement/frequency: Air mattress to decrease pressure, Santyl for chemical debridement of nonviable tissue.  Discussed plan of care with patient and family members, they verbalized understanding. Please re-consult if further assistance is needed.  Thank-you,  Julien Girt MSN, [place], [place], [place], [place]

## 2016-01-12 NOTE — Progress Notes (Signed)
[facility] D/C. Pt not ambulated, pt states "can't walk." Had been bedridden prior to admission.

## 2016-01-12 NOTE — Progress Notes (Signed)
Rehab Admissions Coordinator Note:  Patient was screened by Retta Diones for appropriateness for an Inpatient Acute Rehab Consult.  At this time, an inpatient rehab consult is pending.  I will follow up once consult is completed.  Retta Diones 01/12/2016, 11:27 AM  I can be reached at [phone].

## 2016-01-12 NOTE — Progress Notes (Deleted)
Initial Nutrition Assessment  DOCUMENTATION CODES:   Non-severe (moderate) malnutrition in context of chronic illness  INTERVENTION:  Boost Plus po BID, each supplement provides 360 kcal and 14 grams of protein Multivitamin with minerals daily Encourage PO intake  NUTRITION DIAGNOSIS:   Increased nutrient needs related to wound healing as evidenced by estimated needs.   GOAL:   Patient will meet greater than or equal to 90% of their needs   MONITOR:   PO intake, Supplement acceptance, Labs, Weight trends, Skin, I & O's  REASON FOR ASSESSMENT:   Consult Assessment of nutrition requirement/status  ASSESSMENT:   76 y.o. male with past medical history listed below who was admitted into the hospital for treatment by neurosurgery for advanced spondylitic stenosis. Concern was for management of medical problems including suspected malnutrition, hypokalemia, chronic kidney disease, and hypertension.   Pt states that he used to weigh 195 lbs and has been losing weight over the past year due to a combination of poor appetite due to decreased mobility as well as nausea and reflux. He states that he has been eating 95% less than usual for the past year. Family at bedside confirms that patient eats very small portions. Pt states that he drinks a Boost supplement each morning and that 2 days PTA he felt that his appetite began to improve. He reports eating most of breakfast this morning. RD discussed the role of nutrition in wound healing. Encouraged increasing intake of Boost and snacking between meals. Offered additional supplements. Pt declined Pro-Stat, but is agreeable to multivitamin; he reports taking a variety of vitamin and minerals supplements at home.  Pt has some mild muscle wasting and mild fat wasting per nutrition-focused physical exam.   Labs: low potassium, low calcium, low hemoglobin  Diet Order:  Diet regular Room service appropriate? Yes; Fluid consistency: Thin  Skin:   Wound (see comment) (unstageable/Stage II pressure injury on buttocks)  Last BM:  PTA  Height:   Ht Readings from Last 1 Encounters:  01/11/16 6' (1.829 m)    Weight:   Wt Readings from Last 1 Encounters:  01/11/16 166 lb 14.2 oz (75.7 kg)    Ideal Body Weight:  80.9 kg  BMI:  Body mass index is 22.63 kg/m.  Estimated Nutritional Needs:   Kcal:  1900-2100  Protein:  100-115 grams  Fluid:  2.1 L/day  EDUCATION NEEDS:   No education needs identified at this time  Scarlette Ar RD, CSP, LDN Inpatient Clinical Dietitian Pager: [phone] After Hours Pager: [phone]

## 2016-01-12 NOTE — Progress Notes (Signed)
[facility] PROGRESS NOTE  [name] [id number] DOB: 06/20/1940 DOA: 01/11/2016   PCP: Walker Kehr, MD     Assessment/Plan: Active Problems:   Hyperlipidemia   Essential hypertension   Anemia, iron deficiency   Loss of weight   Lumbar stenosis   Pressure injury of skin   Marvin Morrison is an 76 y.o. male with past medical history listed below who was admitted into the hospital for treatment by neurosurgery for advanced spondylitic stenosis. Concern was for management of medical problems including suspected malnutrition, hypokalemia, chronic kidney disease, and hypertension. Patient is currently sedated as he has been evaluated after procedure.status post lumbar decompression laminectomy last night 01/11/2016.  Assessment and plan Chronic kidney disease, stage III Baseline creatinine around 1.8 Creatinine stable.  Hypokalemia Replete aggressively Replete magnesium Anticipate patient will be medically cleared up electrolyte panel is stable in the morning  Leukocytosis Chest x-ray, UA. Patient currently on cefazolin. Repeat chest x-ray tomorrow if stable anticipate patient will be medically cleared tomorrow Recommend outpatient CT of the chest in 1-2 weeks to be arranged for by PCP    Hyperlipidemia   Essential hypertension - Patient is on verapamil, may have to hold will reassess serum creatinine levels next a.m.    Anemia, iron deficiency - Patient has history of iron deficiency. Was recently transfused. - Iron supplementation, repeat CBC in the morning    Loss of weight - Please consult for dietitian to evaluate. Suspect some sort of malnutrition.    Lumbar stenosis - Status post neuro surgeon evaluation and treatment. Please refer to operative report    DVT prophylaxsis as per neurosurgery  Code Status:  Full code    Family Communication: Discussed in detail with the patient, all imaging results, lab results explained to the patient    Disposition Plan:  As per neurosurgery     Consultants:  [facility]  Procedures:   Laminectomy L2-L5 with decompression of L2 L3 L4 and L5 and S1 nerve roots  Antibiotics: Anti-infectives    Start     Dose/Rate Route Frequency Ordered Stop   01/12/16 0600  ceFAZolin (ANCEF) IVPB 2g/100 mL premix  Status:  Discontinued     2 g 200 mL/hr over 30 Minutes Intravenous On call to O.R. 01/11/16 1311 01/11/16 2037   01/11/16 2200  acyclovir (ZOVIRAX) tablet 400 mg     400 mg Oral 2 times daily 01/11/16 2043     01/11/16 2130  ceFAZolin (ANCEF) IVPB 2g/100 mL premix     2 g 200 mL/hr over 30 Minutes Intravenous Every 8 hours 01/11/16 2043 01/12/16 0521   01/11/16 1524  bacitracin 50,000 Units in sodium chloride irrigation 0.9 % 500 mL irrigation  Status:  Discontinued       As needed 01/11/16 1525 01/11/16 1728   01/11/16 1312  ceFAZolin (ANCEF) 2-4 GM/100ML-% IVPB    Comments:  Rosenberger, Meredit: cabinet override      01/11/16 1312 01/12/16 0129         HPI/Subjective: Complaining of back pain, no chest pain shortness of breath  Objective: Vitals:   01/11/16 2052 01/12/16 0019 01/12/16 0511 01/12/16 0922  BP: (!) 145/69 (!) 124/58 132/71 (!) 108/52  Pulse: 90 83 (!) 103 88  Resp: 16 16 18 20   Temp: 97.9 F (36.6 C) 98 F (36.7 C) 98.6 F (37 C) 98.8 F (37.1 C)  TempSrc: Oral Oral Oral Oral  SpO2: 97% 100% 92% 96%  Weight: 75.7 kg (  166 lb 14.2 oz)     Height: 6' (1.829 m)       Intake/Output Summary (Last 24 hours) at 01/12/16 1014 Last data filed at 01/12/16 0923  Gross per 24 hour  Intake             2460 ml  Output             3200 ml  Net             -740 ml    Exam:  Examination:  General exam: Appears calm and comfortable  Respiratory system: Clear to auscultation. Respiratory effort normal. Cardiovascular system: S1 & S2 heard, RRR. No JVD, murmurs, rubs, gallops or clicks. No pedal edema. Gastrointestinal system: Abdomen is nondistended, soft and  nontender. No organomegaly or masses felt. Normal bowel sounds heard. Central nervous system: Alert and oriented. No focal neurological deficits. Extremities: Symmetric 5 x 5 power. Skin: No rashes, lesions or ulcers Psychiatry: Judgement and insight appear normal. Mood & affect appropriate.     Data Reviewed: I have personally reviewed following labs and imaging studies  Micro Results Recent Results (from the past 240 hour(s))  Surgical pcr screen     Status: None   Collection Time: 01/11/16 12:18 PM  Result Value Ref Range Status   MRSA, PCR NEGATIVE NEGATIVE Final   Staphylococcus aureus NEGATIVE NEGATIVE Final    Comment:        The Xpert SA Assay (FDA approved for NASAL specimens in patients over 7 years of age), is one component of a comprehensive surveillance program.  Test performance has been validated by [facility] for patients greater than or equal to 79 year old. It is not intended to diagnose infection nor to guide or monitor treatment.     Radiology Reports Dg Lumbar Spine 2-3 Views  Result Date: 01/11/2016 CLINICAL DATA:  Lumbar spine surgery. EXAM: LUMBAR SPINE - 2-3 VIEW COMPARISON:  12/30/2015 and MRI 11/28/2015 FINDINGS: Two cross-table lateral views of the lumbar spine were obtained. Again noted is multilevel disc disease with marked disc space narrowing at L3-L4. The image labeled #1 demonstrates surgical hardware posterior to L3-L4. The second image has a surgical marker posterior to L2 and S1. IMPRESSION: Surgical marking for lumbar spine surgery. Electronically Signed   By: Markus Daft M.D.   On: 01/11/2016 19:12     CBC  Recent Labs Lab 01/11/16 1218  WBC 17.4*  HGB 9.1*  HCT 26.3*  PLT 279  MCV 95.6  MCH 33.1  MCHC 34.6  RDW 15.2    Chemistries   Recent Labs Lab 01/11/16 1218 01/11/16 1805 01/11/16 2327 01/12/16 0504  NA 137  --  136 138  K 2.5*  --  3.6 2.7*  CL 101  --  98* 102  CO2 26  --  28 28  GLUCOSE 87  --  201* 88   BUN 25*  --  23* 21*  CREATININE 1.86*  --  1.76* 1.68*  CALCIUM 8.5*  --  7.6* 7.8*  MG  --  1.7  --   --   AST  --   --   --  19  ALT  --   --   --  18  ALKPHOS  --   --   --  46  BILITOT  --   --   --  0.5   ------------------------------------------------------------------------------------------------------------------ estimated creatinine clearance is 39.4 mL/min (by C-G formula based on SCr of 1.68 mg/dL (H)). ------------------------------------------------------------------------------------------------------------------  No results for input(s): HGBA1C in the last 72 hours. ------------------------------------------------------------------------------------------------------------------ No results for input(s): CHOL, HDL, LDLCALC, TRIG, CHOLHDL, LDLDIRECT in the last 72 hours. ------------------------------------------------------------------------------------------------------------------ No results for input(s): TSH, T4TOTAL, T3FREE, THYROIDAB in the last 72 hours.  Invalid input(s): FREET3 ------------------------------------------------------------------------------------------------------------------ No results for input(s): VITAMINB12, FOLATE, FERRITIN, TIBC, IRON, RETICCTPCT in the last 72 hours.  Coagulation profile No results for input(s): INR, PROTIME in the last 168 hours.  No results for input(s): DDIMER in the last 72 hours.  Cardiac Enzymes No results for input(s): CKMB, TROPONINI, MYOGLOBIN in the last 168 hours.  Invalid input(s): CK ------------------------------------------------------------------------------------------------------------------ Invalid input(s): POCBNP   CBG: No results for input(s): GLUCAP in the last 168 hours.     Studies: Dg Lumbar Spine 2-3 Views  Result Date: 01/11/2016 CLINICAL DATA:  Lumbar spine surgery. EXAM: LUMBAR SPINE - 2-3 VIEW COMPARISON:  12/30/2015 and MRI 11/28/2015 FINDINGS: Two cross-table lateral views of  the lumbar spine were obtained. Again noted is multilevel disc disease with marked disc space narrowing at L3-L4. The image labeled #1 demonstrates surgical hardware posterior to L3-L4. The second image has a surgical marker posterior to L2 and S1. IMPRESSION: Surgical marking for lumbar spine surgery. Electronically Signed   By: Markus Daft M.D.   On: 01/11/2016 19:12      Lab Results  Component Value Date   HGBA1C 5.8 04/18/2014   HGBA1C 5.9 12/25/2013   HGBA1C 5.7 06/13/2013   Lab Results  Component Value Date   LDLCALC 102 (H) 06/13/2013   CREATININE 1.68 (H) 01/12/2016       Scheduled Meds: . acyclovir  400 mg Oral BID  . docusate sodium  100 mg Oral BID  . ferrous sulfate  325 mg Oral TID WC  . magnesium sulfate 1 - 4 g bolus IVPB  1 g Intravenous Once  . pantoprazole  40 mg Oral Daily  . potassium chloride  40 mEq Oral BID  . potassium chloride  40 mEq Oral TID  . senna  1 tablet Oral BID  . sodium chloride flush  3 mL Intravenous Q12H  . verapamil  240 mg Oral QHS   Continuous Infusions: . sodium chloride    . 0.9 % NaCl with KCl 40 mEq / L       LOS: 1 day    Time spent: >30 MINS    [facility] Pager [phone]. If 7PM-7AM, please contact night-coverage at [email or web address], password [facility] 01/12/2016, 10:14 AM  LOS: 1 day

## 2016-01-12 NOTE — Progress Notes (Signed)
Initial Nutrition Assessment  DOCUMENTATION CODES:   Non-severe (moderate) malnutrition in context of chronic illness  INTERVENTION:  Boost Plus po BID, each supplement provides 360 kcal and 14 grams of protein Multivitamin with minerals daily Encourage PO intake  Recommend 500 mg of Vitamin C daily for 2-3 weeks   NUTRITION DIAGNOSIS:   Increased nutrient needs related to wound healing as evidenced by estimated needs.   GOAL:   Patient will meet greater than or equal to 90% of their needs   MONITOR:   PO intake, Supplement acceptance, Labs, Weight trends, Skin, I & O's  REASON FOR ASSESSMENT:   Consult Assessment of nutrition requirement/status  ASSESSMENT:   76 y.o. male with past medical history listed below who was admitted into the hospital for treatment by neurosurgery for advanced spondylitic stenosis. Concern was for management of medical problems including suspected malnutrition, hypokalemia, chronic kidney disease, and hypertension.   Pt states that he used to weigh 195 lbs and has been losing weight over the past year due to a combination of poor appetite due to decreased mobility as well as nausea and reflux. He states that he has been eating 95% less than usual for the past year. Family at bedside confirms that patient eats very small portions. Pt states that he drinks a Boost supplement each morning and that 2 days PTA he felt that his appetite began to improve. He reports eating most of breakfast this morning. RD discussed the role of nutrition in wound healing. Encouraged increasing intake of Boost and snacking between meals. Offered additional supplements. Pt declined Pro-Stat, but is agreeable to multivitamin; he reports taking a variety of vitamin and minerals supplements at home. RD reviewed supplements and provided feedback.  Pt has some mild muscle wasting and mild fat wasting per nutrition-focused physical exam.   Labs: low potassium, low calcium, low  hemoglobin  Diet Order:  Diet regular Room service appropriate? Yes; Fluid consistency: Thin  Skin:  Wound (see comment) (unstageable/Stage II pressure injury on buttocks)  Last BM:  PTA  Height:   Ht Readings from Last 1 Encounters:  01/11/16 6' (1.829 m)    Weight:   Wt Readings from Last 1 Encounters:  01/11/16 166 lb 14.2 oz (75.7 kg)    Ideal Body Weight:  80.9 kg  BMI:  Body mass index is 22.63 kg/m.  Estimated Nutritional Needs:   Kcal:  1900-2100  Protein:  100-115 grams  Fluid:  2.1 L/day  EDUCATION NEEDS:   No education needs identified at this time  Scarlette Ar RD, CSP, LDN Inpatient Clinical Dietitian Pager: [phone] After Hours Pager: [phone]

## 2016-01-12 NOTE — Progress Notes (Signed)
Patient was bladder scanned again. Bladder scan volume was 533 ML. Pt attempted to urinate, and urinated 50ML. Writer advised that we needed to IN & OUT Cath. Patient refused ; and states he wants to keep trying. Will continue to monitor patient.

## 2016-01-12 NOTE — Progress Notes (Signed)
Physical medicine rehabilitation consult requested chart reviewed. Patient status post lumbar decompression laminectomy last night 01/11/2016. Currently awaiting physical and occupational therapy evaluations. Will follow-up with appropriate recommendations

## 2016-01-13 ENCOUNTER — Inpatient Hospital Stay (HOSPITAL_COMMUNITY): Payer: Medicare Other

## 2016-01-13 ENCOUNTER — Inpatient Hospital Stay (HOSPITAL_COMMUNITY)
Admission: RE | Admit: 2016-01-13 | Discharge: 2016-02-06 | DRG: 092 | Disposition: A | Discharge status: Home Health | Payer: Medicare Other | Source: Intra-hospital | Attending: Physical Medicine & Rehabilitation | Admitting: Physical Medicine & Rehabilitation

## 2016-01-13 ENCOUNTER — Encounter (HOSPITAL_COMMUNITY): Payer: Self-pay | Admitting: *Deleted

## 2016-01-13 DIAGNOSIS — B9620 Unspecified Escherichia coli [E. coli] as the cause of diseases classified elsewhere: Secondary | ICD-10-CM | POA: Diagnosis present

## 2016-01-13 DIAGNOSIS — M792 Neuralgia and neuritis, unspecified: Secondary | ICD-10-CM | POA: Diagnosis not present

## 2016-01-13 DIAGNOSIS — M5417 Radiculopathy, lumbosacral region: Secondary | ICD-10-CM | POA: Diagnosis not present

## 2016-01-13 DIAGNOSIS — K5900 Constipation, unspecified: Secondary | ICD-10-CM | POA: Diagnosis present

## 2016-01-13 DIAGNOSIS — L89322 Pressure ulcer of left buttock, stage 2: Secondary | ICD-10-CM | POA: Diagnosis present

## 2016-01-13 DIAGNOSIS — N183 Chronic kidney disease, stage 3 unspecified: Secondary | ICD-10-CM

## 2016-01-13 DIAGNOSIS — R739 Hyperglycemia, unspecified: Secondary | ICD-10-CM | POA: Diagnosis not present

## 2016-01-13 DIAGNOSIS — R195 Other fecal abnormalities: Secondary | ICD-10-CM | POA: Diagnosis not present

## 2016-01-13 DIAGNOSIS — E875 Hyperkalemia: Secondary | ICD-10-CM

## 2016-01-13 DIAGNOSIS — N390 Urinary tract infection, site not specified: Secondary | ICD-10-CM | POA: Diagnosis present

## 2016-01-13 DIAGNOSIS — E876 Hypokalemia: Secondary | ICD-10-CM

## 2016-01-13 DIAGNOSIS — M5415 Radiculopathy, thoracolumbar region: Secondary | ICD-10-CM | POA: Diagnosis not present

## 2016-01-13 DIAGNOSIS — K5903 Drug induced constipation: Secondary | ICD-10-CM

## 2016-01-13 DIAGNOSIS — E8809 Other disorders of plasma-protein metabolism, not elsewhere classified: Secondary | ICD-10-CM

## 2016-01-13 DIAGNOSIS — D62 Acute posthemorrhagic anemia: Secondary | ICD-10-CM

## 2016-01-13 DIAGNOSIS — D72820 Lymphocytosis (symptomatic): Secondary | ICD-10-CM | POA: Diagnosis not present

## 2016-01-13 DIAGNOSIS — Z9889 Other specified postprocedural states: Secondary | ICD-10-CM | POA: Diagnosis not present

## 2016-01-13 DIAGNOSIS — G479 Sleep disorder, unspecified: Secondary | ICD-10-CM

## 2016-01-13 DIAGNOSIS — N179 Acute kidney failure, unspecified: Secondary | ICD-10-CM

## 2016-01-13 DIAGNOSIS — Z79899 Other long term (current) drug therapy: Secondary | ICD-10-CM | POA: Diagnosis not present

## 2016-01-13 DIAGNOSIS — M79609 Pain in unspecified limb: Secondary | ICD-10-CM | POA: Diagnosis not present

## 2016-01-13 DIAGNOSIS — R339 Retention of urine, unspecified: Secondary | ICD-10-CM

## 2016-01-13 DIAGNOSIS — M7989 Other specified soft tissue disorders: Secondary | ICD-10-CM | POA: Diagnosis not present

## 2016-01-13 DIAGNOSIS — L89312 Pressure ulcer of right buttock, stage 2: Secondary | ICD-10-CM | POA: Diagnosis present

## 2016-01-13 DIAGNOSIS — R300 Dysuria: Secondary | ICD-10-CM | POA: Diagnosis not present

## 2016-01-13 DIAGNOSIS — R0989 Other specified symptoms and signs involving the circulatory and respiratory systems: Secondary | ICD-10-CM | POA: Diagnosis not present

## 2016-01-13 DIAGNOSIS — Z7952 Long term (current) use of systemic steroids: Secondary | ICD-10-CM

## 2016-01-13 DIAGNOSIS — I10 Essential (primary) hypertension: Secondary | ICD-10-CM

## 2016-01-13 DIAGNOSIS — R2689 Other abnormalities of gait and mobility: Secondary | ICD-10-CM | POA: Diagnosis present

## 2016-01-13 DIAGNOSIS — I129 Hypertensive chronic kidney disease with stage 1 through stage 4 chronic kidney disease, or unspecified chronic kidney disease: Secondary | ICD-10-CM | POA: Diagnosis present

## 2016-01-13 DIAGNOSIS — M48061 Spinal stenosis, lumbar region without neurogenic claudication: Secondary | ICD-10-CM | POA: Diagnosis not present

## 2016-01-13 DIAGNOSIS — Z993 Dependence on wheelchair: Secondary | ICD-10-CM

## 2016-01-13 DIAGNOSIS — E46 Unspecified protein-calorie malnutrition: Secondary | ICD-10-CM

## 2016-01-13 DIAGNOSIS — K5909 Other constipation: Secondary | ICD-10-CM | POA: Diagnosis not present

## 2016-01-13 DIAGNOSIS — Z419 Encounter for procedure for purposes other than remedying health state, unspecified: Secondary | ICD-10-CM

## 2016-01-13 DIAGNOSIS — M5410 Radiculopathy, site unspecified: Secondary | ICD-10-CM | POA: Diagnosis present

## 2016-01-13 DIAGNOSIS — G8918 Other acute postprocedural pain: Secondary | ICD-10-CM

## 2016-01-13 DIAGNOSIS — K5901 Slow transit constipation: Secondary | ICD-10-CM | POA: Diagnosis not present

## 2016-01-13 DIAGNOSIS — Z888 Allergy status to other drugs, medicaments and biological substances status: Secondary | ICD-10-CM | POA: Diagnosis not present

## 2016-01-13 DIAGNOSIS — E782 Mixed hyperlipidemia: Secondary | ICD-10-CM

## 2016-01-13 LAB — CBC
HEMATOCRIT: 28.6 % — AB (ref 39.0–52.0)
Hemoglobin: 9.8 g/dL — ABNORMAL LOW (ref 13.0–17.0)
MCH: 32.8 pg (ref 26.0–34.0)
MCHC: 34.3 g/dL (ref 30.0–36.0)
MCV: 95.7 fL (ref 78.0–100.0)
Platelets: 202 10*3/uL (ref 150–400)
RBC: 2.99 MIL/uL — ABNORMAL LOW (ref 4.22–5.81)
RDW: 16.4 % — ABNORMAL HIGH (ref 11.5–15.5)
WBC: 12.6 10*3/uL — AB (ref 4.0–10.5)

## 2016-01-13 LAB — POCT I-STAT 4, (NA,K, GLUC, HGB,HCT)
GLUCOSE: 107 mg/dL — AB (ref 65–99)
HCT: 21 % — ABNORMAL LOW (ref 39.0–52.0)
HEMOGLOBIN: 7.1 g/dL — AB (ref 13.0–17.0)
POTASSIUM: 2.4 mmol/L — AB (ref 3.5–5.1)
SODIUM: 136 mmol/L (ref 135–145)

## 2016-01-13 LAB — COMPREHENSIVE METABOLIC PANEL
ALT: 8 U/L — ABNORMAL LOW (ref 17–63)
ANION GAP: 6 (ref 5–15)
AST: 17 U/L (ref 15–41)
Albumin: 2.4 g/dL — ABNORMAL LOW (ref 3.5–5.0)
Alkaline Phosphatase: 56 U/L (ref 38–126)
BILIRUBIN TOTAL: 0.7 mg/dL (ref 0.3–1.2)
BUN: 22 mg/dL — ABNORMAL HIGH (ref 6–20)
CO2: 27 mmol/L (ref 22–32)
Calcium: 7.8 mg/dL — ABNORMAL LOW (ref 8.9–10.3)
Chloride: 105 mmol/L (ref 101–111)
Creatinine, Ser: 1.64 mg/dL — ABNORMAL HIGH (ref 0.61–1.24)
GFR, EST AFRICAN AMERICAN: 45 mL/min — AB (ref >60)
GFR, EST NON AFRICAN AMERICAN: 39 mL/min — AB (ref >60)
Glucose, Bld: 156 mg/dL — ABNORMAL HIGH (ref 65–99)
POTASSIUM: 3.7 mmol/L (ref 3.5–5.1)
Sodium: 138 mmol/L (ref 135–145)
TOTAL PROTEIN: 4.5 g/dL — AB (ref 6.5–8.1)

## 2016-01-13 MED ORDER — ADULT MULTIVITAMIN W/MINERALS CH
1.0000 | ORAL_TABLET | Freq: Every day | ORAL | Status: DC
  Administered 2016-01-14 – 2016-02-06 (×24): 1 via ORAL
  Filled 2016-01-13 (×24): qty 1

## 2016-01-13 MED ORDER — FERROUS SULFATE 325 (65 FE) MG PO TABS
325.0000 mg | ORAL_TABLET | Freq: Three times a day (TID) | ORAL | Status: DC
  Administered 2016-01-14 – 2016-02-06 (×71): 325 mg via ORAL
  Filled 2016-01-13 (×71): qty 1

## 2016-01-13 MED ORDER — ACYCLOVIR 400 MG PO TABS
400.0000 mg | ORAL_TABLET | Freq: Two times a day (BID) | ORAL | Status: DC
  Administered 2016-01-13 – 2016-02-06 (×48): 400 mg via ORAL
  Filled 2016-01-13 (×49): qty 1

## 2016-01-13 MED ORDER — SENNA 8.6 MG PO TABS
1.0000 | ORAL_TABLET | Freq: Two times a day (BID) | ORAL | Status: DC
  Administered 2016-01-13 – 2016-01-17 (×6): 8.6 mg via ORAL
  Filled 2016-01-13 (×7): qty 1

## 2016-01-13 MED ORDER — ONDANSETRON HCL 4 MG PO TABS
4.0000 mg | ORAL_TABLET | Freq: Three times a day (TID) | ORAL | Status: DC | PRN
  Administered 2016-01-14: 4 mg via ORAL
  Filled 2016-01-13: qty 1

## 2016-01-13 MED ORDER — POLYETHYLENE GLYCOL 3350 17 G PO PACK
17.0000 g | PACK | Freq: Every day | ORAL | Status: DC | PRN
  Administered 2016-02-04: 17 g via ORAL
  Filled 2016-01-13: qty 1

## 2016-01-13 MED ORDER — ACETAMINOPHEN 325 MG PO TABS
650.0000 mg | ORAL_TABLET | ORAL | Status: DC | PRN
  Administered 2016-01-13: 650 mg via ORAL
  Filled 2016-01-13: qty 2

## 2016-01-13 MED ORDER — ACETAMINOPHEN 650 MG RE SUPP: 650.0000 mg | RECTAL | Status: DC | PRN

## 2016-01-13 MED ORDER — METHOCARBAMOL 1000 MG/10ML IJ SOLN
500.0000 mg | Freq: Four times a day (QID) | INTRAVENOUS | Status: DC | PRN
  Filled 2016-01-13: qty 5

## 2016-01-13 MED ORDER — PANTOPRAZOLE SODIUM 40 MG PO TBEC
40.0000 mg | DELAYED_RELEASE_TABLET | Freq: Every day | ORAL | Status: DC
  Administered 2016-01-14 – 2016-02-06 (×24): 40 mg via ORAL
  Filled 2016-01-13 (×24): qty 1

## 2016-01-13 MED ORDER — BISACODYL 10 MG RE SUPP
10.0000 mg | Freq: Every day | RECTAL | Status: DC | PRN
  Administered 2016-01-17 – 2016-02-04 (×2): 10 mg via RECTAL
  Filled 2016-01-13 (×2): qty 1

## 2016-01-13 MED ORDER — COLLAGENASE 250 UNIT/GM EX OINT
TOPICAL_OINTMENT | Freq: Every day | CUTANEOUS | Status: DC
  Administered 2016-01-14 – 2016-01-26 (×13): via TOPICAL
  Filled 2016-01-13: qty 30

## 2016-01-13 MED ORDER — HYDROCODONE-ACETAMINOPHEN 5-325 MG PO TABS
1.0000 | ORAL_TABLET | ORAL | Status: DC | PRN
  Administered 2016-01-13 – 2016-01-25 (×39): 2 via ORAL
  Filled 2016-01-13 (×39): qty 2

## 2016-01-13 MED ORDER — METHOCARBAMOL 500 MG PO TABS
500.0000 mg | ORAL_TABLET | Freq: Four times a day (QID) | ORAL | Status: DC | PRN
  Administered 2016-01-13 – 2016-01-14 (×3): 500 mg via ORAL
  Filled 2016-01-13 (×3): qty 1

## 2016-01-13 MED ORDER — SORBITOL 70 % SOLN
30.0000 mL | Freq: Every day | Status: DC | PRN
  Administered 2016-01-13: 30 mL via ORAL
  Filled 2016-01-13: qty 30

## 2016-01-13 MED ORDER — OXYCODONE HCL 5 MG PO TABS
5.0000 mg | ORAL_TABLET | Freq: Once | ORAL | Status: AC
  Administered 2016-01-14: 5 mg via ORAL
  Filled 2016-01-13: qty 1

## 2016-01-13 MED ORDER — VERAPAMIL HCL ER 240 MG PO TBCR
240.0000 mg | EXTENDED_RELEASE_TABLET | Freq: Every day | ORAL | Status: DC
  Administered 2016-01-13 – 2016-01-14 (×2): 240 mg via ORAL
  Filled 2016-01-13 (×2): qty 1

## 2016-01-13 MED ORDER — BOOST PLUS PO LIQD
237.0000 mL | Freq: Three times a day (TID) | ORAL | Status: DC
  Administered 2016-01-13 – 2016-01-21 (×13): 237 mL via ORAL
  Filled 2016-01-13 (×27): qty 237

## 2016-01-13 MED ORDER — POTASSIUM CHLORIDE CRYS ER 20 MEQ PO TBCR
40.0000 meq | EXTENDED_RELEASE_TABLET | Freq: Two times a day (BID) | ORAL | Status: DC
  Administered 2016-01-13 – 2016-01-27 (×29): 40 meq via ORAL
  Filled 2016-01-13 (×30): qty 2

## 2016-01-13 NOTE — Progress Notes (Signed)
Patient ID: Marvin Morrison, male   DOB: [birth year], 76 y.o.   MRN: [id number] Vital signs are stable Patient still has moderate proximal lower extremity weakness Appears use a good candidate for transfer to rehabilitation Will plan transfer today

## 2016-01-13 NOTE — Progress Notes (Signed)
[facility] PROGRESS NOTE  Marvin Morrison [id number] DOB: March 13, 1940 DOA: 01/11/2016   PCP: Walker Kehr, MD     Assessment/Plan: Active Problems:   Hyperlipidemia   Essential hypertension   Anemia, iron deficiency   Loss of weight   Lumbar stenosis   Pressure injury of skin   Malnutrition of moderate degree   Marvin Morrison is an 76 y.o. male with past medical history listed below who was admitted into the hospital for treatment by neurosurgery for advanced spondylitic stenosis. Concern was for management of medical problems including suspected malnutrition, hypokalemia, chronic kidney disease, and hypertension. Patient is currently sedated as he has been evaluated after procedure.status post lumbar decompression laminectomy  01/11/2016.  Assessment and plan Chronic kidney disease, stage III Baseline creatinine around 1.8 Creatinine stable.  Hypokalemia Repleted aggressively Replete magnesium Will Require ongoing supplementation after discharge  Leukocytosis, improving Chest x-ray, UA negative. Received cefazolin perioperatively. Recommend outpatient CT of the chest in 1-2 weeks to be arranged for by PCP to further evaluate " Vague nodularity throughout the lungs  "patient would benefit from outpatient high-resolution CT scan Also check anti -CCP antibody, rheumatoid factor      Hyperlipidemia   Essential hypertension - Patient is on verapamil, may have to hold will reassess serum creatinine levels next a.m.    Anemia, iron deficiency-hemoglobin dropped from 11.8-7.1 - Patient has history of iron deficiency.  Transfused 1/8 Repeat hemoglobin 9.8     Loss of weight, moderate protein calorie malnutrition Nutrition consulted    Lumbar stenosis - Status post neuro surgeon evaluation and treatment. Please refer to operative report    DVT prophylaxsis as per neurosurgery  Code Status:  Full code    Family Communication: Discussed in detail  with the patient, all imaging results, lab results explained to the patient   Disposition Plan:  Okay to discharge from a medical standpoint     Consultants:  Wink  Procedures:   Laminectomy L2-L5 with decompression of L2 L3 L4 and L5 and S1 nerve roots  Antibiotics: Anti-infectives    Start     Dose/Rate Route Frequency Ordered Stop   01/12/16 0600  ceFAZolin (ANCEF) IVPB 2g/100 mL premix  Status:  Discontinued     2 g 200 mL/hr over 30 Minutes Intravenous On call to O.R. 01/11/16 1311 01/11/16 2037   01/11/16 2200  acyclovir (ZOVIRAX) tablet 400 mg     400 mg Oral 2 times daily 01/11/16 2043     01/11/16 2130  ceFAZolin (ANCEF) IVPB 2g/100 mL premix     2 g 200 mL/hr over 30 Minutes Intravenous Every 8 hours 01/11/16 2043 01/12/16 0521   01/11/16 1524  bacitracin 50,000 Units in sodium chloride irrigation 0.9 % 500 mL irrigation  Status:  Discontinued       As needed 01/11/16 1525 01/11/16 1728   01/11/16 1312  ceFAZolin (ANCEF) 2-4 GM/100ML-% IVPB    Comments:  Rosenberger, Meredit: cabinet override      01/11/16 1312 01/12/16 0129         HPI/Subjective: Denies any chest pain shortness of breath,  Objective: Vitals:   01/12/16 2201 01/13/16 0100 01/13/16 0544 01/13/16 0741  BP: 133/71 (!) 154/87 (!) 152/90 134/66  Pulse: 88 (!) 102 95 93  Resp: 20 20 20 20   Temp: 99.3 F (37.4 C) 98.9 F (37.2 C) 98.1 F (36.7 C) 98.4 F (36.9 C)  TempSrc: Oral Oral Oral Oral  SpO2: 93% 95% 95%  95%  Weight:      Height:        Intake/Output Summary (Last 24 hours) at 01/13/16 0756 Last data filed at 01/13/16 0742  Gross per 24 hour  Intake              360 ml  Output             1150 ml  Net             -790 ml    Exam:  Examination:  General exam: Appears calm and comfortable  Respiratory system: Clear to auscultation. Respiratory effort normal. Cardiovascular system: S1 & S2 heard, RRR. No JVD, murmurs, rubs, gallops or clicks. No pedal  edema. Gastrointestinal system: Abdomen is nondistended, soft and nontender. No organomegaly or masses felt. Normal bowel sounds heard. Central nervous system: Alert and oriented. No focal neurological deficits. Extremities: Symmetric 5 x 5 power. Skin: No rashes, lesions or ulcers Psychiatry: Judgement and insight appear normal. Mood & affect appropriate.     Data Reviewed: I have personally reviewed following labs and imaging studies  Micro Results Recent Results (from the past 240 hour(s))  Surgical pcr screen     Status: None   Collection Time: 01/11/16 12:18 PM  Result Value Ref Range Status   MRSA, PCR NEGATIVE NEGATIVE Final   Staphylococcus aureus NEGATIVE NEGATIVE Final    Comment:        The Xpert SA Assay (FDA approved for NASAL specimens in patients over 84 years of age), is one component of a comprehensive surveillance program.  Test performance has been validated by [facility] for patients greater than or equal to 28 year old. It is not intended to diagnose infection nor to guide or monitor treatment.     Radiology Reports Dg Lumbar Spine 2-3 Views  Result Date: 01/11/2016 CLINICAL DATA:  Lumbar spine surgery. EXAM: LUMBAR SPINE - 2-3 VIEW COMPARISON:  12/30/2015 and MRI 11/28/2015 FINDINGS: Two cross-table lateral views of the lumbar spine were obtained. Again noted is multilevel disc disease with marked disc space narrowing at L3-L4. The image labeled #1 demonstrates surgical hardware posterior to L3-L4. The second image has a surgical marker posterior to L2 and S1. IMPRESSION: Surgical marking for lumbar spine surgery. Electronically Signed   By: Markus Daft M.D.   On: 01/11/2016 19:12   Dg Chest Port 1 View  Result Date: 01/12/2016 CLINICAL DATA:  Elevated white blood cell count postop lumbar spine surgery EXAM: PORTABLE CHEST 1 VIEW COMPARISON:  Chest x-ray of 04/18/2014 FINDINGS: The lungs are not well aerated and there is subtle nodularity throughout the  lungs. Inflammatory or infectious process cannot be excluded and followup two-view chest x-ray or CT the chest is recommended. No effusion is seen. Mediastinal and hilar contours are unremarkable. The heart is within normal limits in size. No bony abnormality is seen. IMPRESSION: Vague nodularity throughout the lungs could being infectious or inflammatory and followup two-view chest x-ray or CT of the chest is recommended Electronically Signed   By: Ivar Drape M.D.   On: 01/12/2016 11:13     CBC  Recent Labs Lab 01/11/16 1218 01/11/16 1600  WBC 17.4*  --   HGB 9.1* 7.1*  HCT 26.3* 21.0*  PLT 279  --   MCV 95.6  --   MCH 33.1  --   MCHC 34.6  --   RDW 15.2  --     Chemistries   Recent Labs Lab 01/11/16 1218 01/11/16 1600  01/11/16 1805 01/11/16 2327 01/12/16 0504  NA 137 136  --  136 138  K 2.5* 2.4*  --  3.6 2.7*  CL 101  --   --  98* 102  CO2 26  --   --  28 28  GLUCOSE 87 107*  --  201* 88  BUN 25*  --   --  23* 21*  CREATININE 1.86*  --   --  1.76* 1.68*  CALCIUM 8.5*  --   --  7.6* 7.8*  MG  --   --  1.7  --   --   AST  --   --   --   --  19  ALT  --   --   --   --  18  ALKPHOS  --   --   --   --  46  BILITOT  --   --   --   --  0.5   ------------------------------------------------------------------------------------------------------------------ estimated creatinine clearance is 39.4 mL/min (by C-G formula based on SCr of 1.68 mg/dL (H)). ------------------------------------------------------------------------------------------------------------------ No results for input(s): HGBA1C in the last 72 hours. ------------------------------------------------------------------------------------------------------------------ No results for input(s): CHOL, HDL, LDLCALC, TRIG, CHOLHDL, LDLDIRECT in the last 72 hours. ------------------------------------------------------------------------------------------------------------------ No results for input(s): TSH, T4TOTAL,  T3FREE, THYROIDAB in the last 72 hours.  Invalid input(s): FREET3 ------------------------------------------------------------------------------------------------------------------ No results for input(s): VITAMINB12, FOLATE, FERRITIN, TIBC, IRON, RETICCTPCT in the last 72 hours.  Coagulation profile No results for input(s): INR, PROTIME in the last 168 hours.  No results for input(s): DDIMER in the last 72 hours.  Cardiac Enzymes No results for input(s): CKMB, TROPONINI, MYOGLOBIN in the last 168 hours.  Invalid input(s): CK ------------------------------------------------------------------------------------------------------------------ Invalid input(s): POCBNP   CBG: No results for input(s): GLUCAP in the last 168 hours.     Studies: Dg Lumbar Spine 2-3 Views  Result Date: 01/11/2016 CLINICAL DATA:  Lumbar spine surgery. EXAM: LUMBAR SPINE - 2-3 VIEW COMPARISON:  12/30/2015 and MRI 11/28/2015 FINDINGS: Two cross-table lateral views of the lumbar spine were obtained. Again noted is multilevel disc disease with marked disc space narrowing at L3-L4. The image labeled #1 demonstrates surgical hardware posterior to L3-L4. The second image has a surgical marker posterior to L2 and S1. IMPRESSION: Surgical marking for lumbar spine surgery. Electronically Signed   By: Markus Daft M.D.   On: 01/11/2016 19:12   Dg Chest Port 1 View  Result Date: 01/12/2016 CLINICAL DATA:  Elevated white blood cell count postop lumbar spine surgery EXAM: PORTABLE CHEST 1 VIEW COMPARISON:  Chest x-ray of 04/18/2014 FINDINGS: The lungs are not well aerated and there is subtle nodularity throughout the lungs. Inflammatory or infectious process cannot be excluded and followup two-view chest x-ray or CT the chest is recommended. No effusion is seen. Mediastinal and hilar contours are unremarkable. The heart is within normal limits in size. No bony abnormality is seen. IMPRESSION: Vague nodularity throughout the lungs  could being infectious or inflammatory and followup two-view chest x-ray or CT of the chest is recommended Electronically Signed   By: Ivar Drape M.D.   On: 01/12/2016 11:13      Lab Results  Component Value Date   HGBA1C 5.8 04/18/2014   HGBA1C 5.9 12/25/2013   HGBA1C 5.7 06/13/2013   Lab Results  Component Value Date   LDLCALC 102 (H) 06/13/2013   CREATININE 1.68 (H) 01/12/2016       Scheduled Meds: . acyclovir  400 mg Oral BID  . collagenase   Topical Daily  .  docusate sodium  100 mg Oral BID  . ferrous sulfate  325 mg Oral TID WC  . lactose free nutrition  237 mL Oral TID BM  . multivitamin with minerals  1 tablet Oral Daily  . pantoprazole  40 mg Oral Daily  . potassium chloride  40 mEq Oral BID  . senna  1 tablet Oral BID  . sodium chloride flush  3 mL Intravenous Q12H  . verapamil  240 mg Oral QHS   Continuous Infusions: . sodium chloride    . 0.9 % NaCl with KCl 40 mEq / L 75 mL/hr (01/13/16 0211)     LOS: 2 days    Time spent: >30 MINS    [facility]  [facility] Pager [phone]. If 7PM-7AM, please contact night-coverage at [email or web address], password [facility] 01/13/2016, 7:56 AM  LOS: 2 days

## 2016-01-13 NOTE — Progress Notes (Signed)
[facility] Admission Coordinator Signed Physical Medicine and Rehabilitation  PMR Pre-admission Date of Service: 01/13/2016 2:38 PM  Related encounter: Admission (Current) from 01/11/2016 in [place]       [] Hide copied text PMR Admission Coordinator Pre-Admission Assessment  Patient: Marvin Morrison is an 76 y.o., male MRN: [id number] DOB: [birth year] Height: 6' (182.9 cm) Weight: 75.7 kg (166 lb 14.2 oz)                                                                                                                                                  Insurance Information HMO:     PPO:      PCP:      IPA:      80/20:      OTHER:  PRIMARY: Medicare A & B      Policy#: [id number]      Subscriber: Self CM Name:       Phone#:      Fax#:  Pre-Cert#: eligible per [employer]      Employer: Retired  Benefits:  Phone #:      Name:  Eff. Date: A: 09/04/03; B: 08/03/05     Deduct: $1340      Out of Pocket Max: none      Life Max: n/a CIR: 100%      SNF: 100% days 1-20; 80% days 21-200 Outpatient: 80%     Co-Pay: 20% Home Health: 100%      Co-Pay: none DME: 80%     Co-Pay: 20% Providers: patient's choice   Medicaid Application Date:       Case Manager:  Disability Application Date:       Case Worker:   Emergency Contact Information        Contact Information    Name Relation Home Work Mobile   [place] Spouse [phone]  [phone]   [place], [place] Daughter [phone]       Current Medical History  Patient Admitting Diagnosis: 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  History of Present Illness: Marvin Morrison a 76 y.o.right handed malewith history of hypertension, chronic renal insufficiency stage III creatinine 1.68.per chart review patient lives with wife. He has essentially been wheelchair and bed bound for 3 months with back pain radiating to the lower  extremities as well as bilateral buttock and sacral ulcer. His wife works during the day. 2 level home with bedroom upstairs. Presented 01/11/2015 after recent MRI demonstrated advanced spondylosis at multiple levels with severe stenosis/radiculopathy at L4-5, L3-4 and L2-3 with disc herniation at L5-S1.No change with conservative care.Underwent laminectomy L-2-L5 with decompression of L2-3-4 and L5 and S1 nerve roots. Discectomy L5-S1 01/11/2016 per Dr. Ellene Route. Hospital course pain management.WOCfollow-up of skin care of decubitus. No plan for back bracing. Acute blood loss anemia 9.1 and monitored.Bouts of urinary  retention bladder scanned for 533 mL's requiring intermittent catheterization. Urinalysis negative nitrite. Hypokalemia 2.7 requiring potassium supplement.Physical and occupational therapy evaluationscompleted with recommendations of physical medicine rehabilitation consult.Patient was admitted for comprehensive rehabilitation program 01/13/16.      Past Medical History      Past Medical History:  Diagnosis Date  . Arthritis   . Chronic kidney disease   . Colitis   . Diarrhea   . Eczema   . Elevated glucose   . HTN (hypertension)   . Hyperlipidemia   . Pneumonia   . Shingles 2009    Family History  family history includes Hyperlipidemia in his father; Hypertension in his father and mother.  Prior Rehab/Hospitalizations:  Has the patient had major surgery during 100 days prior to admission? No  Current Medications   Current Facility-Administered Medications:  .  0.9 %  sodium chloride infusion, 250 mL, Intravenous, Continuous, Kristeen Miss, MD .  0.9 % NaCl with KCl 40 mEq / L  infusion, , Intravenous, Continuous, Reyne Dumas, MD, Last Rate: 75 mL/hr at 01/13/16 0211, 75 mL/hr at 01/13/16 0211 .  acetaminophen (TYLENOL) tablet 650 mg, 650 mg, Oral, Q4H PRN **OR** acetaminophen (TYLENOL) suppository 650 mg, 650 mg, Rectal, Q4H PRN, Kristeen Miss, MD .   acyclovir (ZOVIRAX) tablet 400 mg, 400 mg, Oral, BID, Kristeen Miss, MD, 400 mg at 01/13/16 1116 .  alum & mag hydroxide-simeth (MAALOX/MYLANTA) 200-200-20 MG/5ML suspension 30 mL, 30 mL, Oral, Q6H PRN, Kristeen Miss, MD .  bisacodyl (DULCOLAX) suppository 10 mg, 10 mg, Rectal, Daily PRN, Kristeen Miss, MD .  collagenase (SANTYL) ointment, , Topical, Daily, Reyne Dumas, MD .  docusate sodium (COLACE) capsule 100 mg, 100 mg, Oral, BID, Kristeen Miss, MD, 100 mg at 01/13/16 1116 .  ferrous sulfate tablet 325 mg, 325 mg, Oral, TID WC, Velvet Bathe, MD, 325 mg at 01/13/16 1227 .  HYDROcodone-acetaminophen (NORCO/VICODIN) 5-325 MG per tablet 1-2 tablet, 1-2 tablet, Oral, Q4H PRN, Kristeen Miss, MD, 1 tablet at 01/12/16 1440 .  lactose free nutrition (BOOST PLUS) liquid 237 mL, 237 mL, Oral, TID BM, Reyne Dumas, MD, 237 mL at 01/13/16 1117 .  menthol-cetylpyridinium (CEPACOL) lozenge 3 mg, 1 lozenge, Oral, PRN **OR** phenol (CHLORASEPTIC) mouth spray 1 spray, 1 spray, Mouth/Throat, PRN, Kristeen Miss, MD .  methocarbamol (ROBAXIN) tablet 500 mg, 500 mg, Oral, Q6H PRN, 500 mg at 01/13/16 0823 **OR** methocarbamol (ROBAXIN) 500 mg in dextrose 5 % 50 mL IVPB, 500 mg, Intravenous, Q6H PRN, Kristeen Miss, MD .  morphine 2 MG/ML injection 1-4 mg, 1-4 mg, Intravenous, Q3H PRN, Kristeen Miss, MD, 2 mg at 01/13/16 1256 .  multivitamin with minerals tablet 1 tablet, 1 tablet, Oral, Daily, Reyne Dumas, MD, 1 tablet at 01/13/16 1110 .  ondansetron (ZOFRAN) injection 4 mg, 4 mg, Intravenous, Q4H PRN, Kristeen Miss, MD .  ondansetron [facility]) tablet 4 mg, 4 mg, Oral, Q8H PRN, Kristeen Miss, MD, 4 mg at 01/13/16 1130 .  oxyCODONE-acetaminophen (PERCOCET/ROXICET) 5-325 MG per tablet 1-2 tablet, 1-2 tablet, Oral, Q4H PRN, Kristeen Miss, MD, 1 tablet at 01/13/16 1248 .  pantoprazole (PROTONIX) EC tablet 40 mg, 40 mg, Oral, Daily, Kristeen Miss, MD, 40 mg at 01/13/16 1115 .  polyethylene glycol (MIRALAX / GLYCOLAX) packet 17 g, 17 g,  Oral, Daily PRN, Kristeen Miss, MD .  potassium chloride SA (K-DUR,KLOR-CON) CR tablet 40 mEq, 40 mEq, Oral, BID, Reyne Dumas, MD, 40 mEq at 01/13/16 1115 .  senna (SENOKOT) tablet 8.6 mg, 1 tablet, Oral, BID,  Kristeen Miss, MD, 8.6 mg at 01/13/16 1112 .  sodium chloride flush (NS) 0.9 % injection 3 mL, 3 mL, Intravenous, Q12H, Kristeen Miss, MD .  sodium chloride flush (NS) 0.9 % injection 3 mL, 3 mL, Intravenous, PRN, Kristeen Miss, MD .  verapamil (CALAN-SR) CR tablet 240 mg, 240 mg, Oral, QHS, Kristeen Miss, MD, 240 mg at 01/12/16 2232  Patients Current Diet: Diet regular Room service appropriate? Yes; Fluid consistency: Thin Diet - low sodium heart healthy  Precautions / Restrictions Precautions Precautions: Fall, Back Precaution Booklet Issued: No Precaution Comments: Verbally reviewed with pt Restrictions Weight Bearing Restrictions: No   Has the patient had 2 or more falls or a fall with injury in the past year?Yes  Prior Activity Level Limited Community (1-2x/wk): Patient with a slow decline over the past year due to his back issues.  Recently, over the past few months patient has been limited to living on the second floor only and in the past month mostly bed level.  With family assistance he has been able to make it to medical appointments.  Patient is motivated to be able to walk his [place] again.  Patient also reponds best to one on one interactions with through explaniation.  There have been issues with pain and medication administration timing; patient may benefit from a written chart for what he can have and when.    Home Assistive Devices / Equipment Home Assistive Devices/Equipment: Eyeglasses, Environmental consultant (specify type), Cane (specify quad or straight) Home Equipment: Walker - 2 wheels, Cane - single point, Shower seat  Prior Device Use: Indicate devices/aids used by the patient prior to current illness, exacerbation or injury? Walker and [employer]  Prior Functional  Level Prior Function Level of Independence: Needs assistance Gait / Transfers Assistance Needed: reports could go to computer room with walker from his bed with walker; had to have help (carried) up stairs ADL's / Homemaking Assistance Needed: wife assist with bathing and dressing  Self Care: Did the patient need help bathing, dressing, using the toilet or eating? Needed some help  Indoor Mobility: Did the patient need assistance with walking from room to room (with or without device)? Needed some help  Stairs: Did the patient need assistance with internal or external stairs (with or without device)? Dependent  Functional Cognition: Did the patient need help planning regular tasks such as shopping or remembering to take medications? Independent  Current Functional Level Cognition Overall Cognitive Status: Within Functional Limits for tasks assessed Orientation Level: Oriented X4    Extremity Assessment (includes Sensation/Coordination) Upper Extremity Assessment: Generalized weakness  Lower Extremity Assessment: Defer to PT evaluation RLE Deficits / Details: able to lift antigravity, but functionally very weak, strength not formally tested RLE Sensation: decreased light touch, decreased proprioception LLE Deficits / Details: able to lift antigravity, but functionally very weak, strength not formally tested LLE Sensation: decreased light touch, decreased proprioception   ADLs Overall ADL's : Needs assistance/impaired Grooming: Set up, Sitting, Supervision/safety Upper Body Bathing: Minimal assistance, Cueing for safety, Sitting Lower Body Bathing: Maximal assistance, Sit to/from stand Upper Body Dressing : Minimal assistance, Sitting Lower Body Dressing: Maximal assistance, Sit to/from stand Toilet Transfer: Minimal assistance, Moderate assistance, Ambulation, RW, +2 for safety/equipment Toileting- Clothing Manipulation and Hygiene: Moderate assistance, Sit to/from  stand Functional mobility during ADLs: Minimal assistance, Moderate assistance, +2 for safety/equipment, Rolling walker General ADL Comments: Pt educated on back precautions during ADL. Walking with back bent and would straighten when cues given but pt returned  to incorrect posture regularly.   Mobility Overal bed mobility: Needs Assistance Bed Mobility: Rolling, Sidelying to Sit Rolling: Supervision Sidelying to sit: Min assist Sit to sidelying: Mod assist General bed mobility comments: assist for lifting trunk, cues for hand placement   Transfers Overall transfer level: Needs assistance Equipment used: Rolling walker (2 wheeled) Transfers: Sit to/from Stand, Stand Pivot Transfers Sit to Stand: Min assist, From elevated surface Stand pivot transfers: Min assist General transfer comment: difficulty pushing up from EOB, needed to have at least one hand on walker due to LE weakness; stood from EOB and pivot with RW to recliner for lunch, encouraged up for at lest 45 minutes   Ambulation / Gait / Stairs / Wheelchair Mobility Ambulation/Gait Ambulation/Gait assistance: Museum/gallery curator (Feet): 30 Feet Assistive device: Rolling walker (2 wheeled) Gait Pattern/deviations: Step-through pattern, Decreased stride length, Trunk flexed, Shuffle, Wide base of support General Gait Details: unable to extend trunk, LE weakness evident and heavy UE support on walker   Posture / Balance Balance Overall balance assessment: Needs assistance Sitting-balance support: Feet supported, No upper extremity supported Sitting balance-Leahy Scale: Good Standing balance support: Bilateral upper extremity supported Standing balance-Leahy Scale: Poor Standing balance comment: assist with UE support on walker for balance   Special needs/care consideration BiPAP/CPAP: No CPM: No Continuous Drip IV: No Dialysis: No        Life Vest: No Oxygen: No Special Bed: No Trach Size: No Wound Vac (area): No        Skin: Dry all over; Bruising to arms and legs; Abrasion left hand;                           *Location: Left and right buttocks each have a wound; 5X2X.1cm; 30% of the wounds is stage 2 pressure injurry, 70% is yellow slough in patchy areas of unstageable pressure injury inner gluteal fold with partial thickness skin loss fissure related to moisture assiocated skin damage. Wound care RN recommends: Air mattress to decrease pressure, Santyl for chemical debridement of nonviable tissue.  Bowel mgmt: PTA 01/10/16 Bladder mgmt: 1/9 foley removed with need for in and out cath due to retention   Diabetic mgmt: No    Previous Home Environment Living Arrangements: Spouse/significant other Available Help at Discharge: Family Type of Home: House Home Layout: Two level, Bed/bath upstairs Alternate Level Stairs-Rails: Right Alternate Level Stairs-Number of Steps: 14 Home Access: Stairs to enter Entrance Stairs-Rails: None Entrance Stairs-Number of Steps: 2 Bathroom Shower/Tub: Multimedia programmer: Standard Bathroom Accessibility: Yes How Accessible: Accessible via walker Additional Comments: Pt has been staying upstairs and having people carry him up and down the stairs.  Discharge Living Setting Plans for Discharge Living Setting: Patient's home, Lives with (comment) (spouse: Olin Hauser ) Type of Home at Discharge: House Discharge Home Layout: Two level, 1/2 bath on main level, Bed/bath upstairs Alternate Level Stairs-Rails: Right Alternate Level Stairs-Number of Steps: 15 steps Discharge Home Access: Stairs to enter Entrance Stairs-Rails: None Entrance Stairs-Number of Steps: 2 steps  Discharge Bathroom Shower/Tub: Walk-in shower Discharge Bathroom Toilet: Standard Discharge Bathroom Accessibility: Yes How Accessible: Accessible via walker Does the patient have any problems obtaining your medications?: No  Social/Family/Support Systems Patient Roles: Spouse, Parent Contact  Information: Spouse: Chioke Jorde Anticipated Caregiver: Spouse Anticipated Caregiver's Contact Information: [phone] Ability/Limitations of Caregiver: Intermittent  Caregiver Availability: Intermittent (can check at lunch and evenings like PTA) Discharge Plan Discussed with Primary Caregiver: Yes Is Caregiver  In Agreement with Plan?: Yes Does Caregiver/Family have Issues with Lodging/Transportation while Pt is in Rehab?: No  Goals/Additional Needs Patient/Family Goal for Rehab: PT/OT Mod I  Expected length of stay: 7-11 days  Cultural Considerations: None Dietary Needs: Regular and thin  Equipment Needs: TBD Special Service Needs: None Additional Information: None Pt/Family Agrees to Admission and willing to participate: Yes Program Orientation Provided & Reviewed with Pt/Caregiver Including Roles  & Responsibilities: Yes Additional Information Needs: Medication chart  Information Needs to be Provided By: Nursing    Decrease burden of Care through IP rehab admission: No  Possible need for SNF placement upon discharge: No  Patient Condition: This patient's condition remains as documented in the consult dated 01/13/16, in which the Rehabilitation Physician determined and documented that the patient's condition is appropriate for intensive rehabilitative care in an inpatient rehabilitation facility. Will admit to inpatient rehab today.   Preadmission Screen Completed By:  Marvin Morrison, 01/13/2016 2:41 PM ______________________________________________________________________   Discussed status with Dr. Posey Pronto on 01/13/16 at 1454 and received telephone approval for admission today.  Admission Coordinator:  Marvin Morrison, time 1454/Date 01/13/16       Cosigned by: Ankit [name], MD at 01/13/2016 2:59 PM  Revision History

## 2016-01-13 NOTE — Progress Notes (Signed)
Inpatient Rehabilitation  Met with patient, spouse, and daughter at the bedside to discuss team's recommendation for IP Rehab.  Shared booklets and answered questions.  They are in agreement with plans for IP Rehab.  Plan to admit patient today.  Please call with questions.    Carmelia Roller., CCC/SLP Admission Coordinator  [place]  Cell [phone]

## 2016-01-13 NOTE — Progress Notes (Signed)
Meredith Staggers, MD Physician Signed Physical Medicine and Rehabilitation  Consult Note Date of Service: 01/12/2016 11:34 AM  Related encounter: Admission (Current) from 01/11/2016 in [place] All Collapse All   [] Hide copied text [] Hover for attribution information      Physical Medicine and Rehabilitation Consult Reason for Consult: Lumbar radiculopathy Referring Physician: [facility]    HPI: Marvin Morrison is a 76 y.o. right handed male with history of hypertension, chronic renal insufficiency stage III creatinine. per chart review patient lives with wife. He has essentially been wheelchair and bed bound for 3 months with back pain radiating to the lower extremities as well as bilateral buttock and sacral ulcer. His wife works during the day. 2 level home with bedroom upstairs. 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 Underwent laminectomy L-2-L5 with decompression of L2-3-4 and L5 and S1 nerve roots. Discectomy L5-S1 01/11/2016 per Dr. Ellene Route. Hospital course pain management.WOC follow-up of skin care of decubitus. No plan for back bracing. Acute blood loss anemia 9.1 and monitored. Physical therapy evaluation completed with recommendations of physical medicine rehabilitation consult.   Review of Systems  Constitutional: Negative for chills and fever.  HENT: Negative for hearing loss and tinnitus.   Eyes: Negative for blurred vision.  Respiratory: Positive for shortness of breath. Negative for cough.   Cardiovascular: Positive for leg swelling. Negative for chest pain and palpitations.  Gastrointestinal: Positive for constipation. Negative for nausea and vomiting.  Genitourinary: Positive for urgency. Negative for dysuria, flank pain and hematuria.  Musculoskeletal: Positive for back  pain, falls and myalgias.  Skin: Negative for rash.  Neurological: Positive for weakness. Negative for seizures and loss of consciousness.  All other systems reviewed and are negative.      Past Medical History:  Diagnosis Date  . Arthritis   . Chronic kidney disease   . Colitis   . Diarrhea   . Eczema   . Elevated glucose   . HTN (hypertension)   . Hyperlipidemia   . Pneumonia   . Shingles 2009        Past Surgical History:  Procedure Laterality Date  . TONSILLECTOMY     Age 31        Family History  Problem Relation Age of Onset  . Hypertension Mother   . Hyperlipidemia Father   . Hypertension Father    Social History:  reports that he has never smoked. He has never used smokeless tobacco. He reports that he does not drink alcohol or use drugs. Allergies:       Allergies  Allergen Reactions  . Gabapentin Other (See Comments)    DIZZY FALLS         Medications Prior to Admission  Medication Sig Dispense Refill  . acyclovir (ZOVIRAX) 400 MG tablet Take 1 tablet (400 mg total) by mouth 2 (two) times daily. 60 tablet 11  . ferrous sulfate 325 (65 FE) MG tablet TAKE 1 TABLET BY MOUTH EVERY DAY 30 tablet 5  . ondansetron (ZOFRAN) 4 MG tablet TAKE 1 TABLET BY MOUTH EVERY 8 HOURS AS NEEDED FOR NAUSEA AND VOMITING 30 tablet 5  . pantoprazole (PROTONIX) 40 MG tablet Take 1 tablet (40 mg total) by mouth daily. 90 tablet 2  . predniSONE (DELTASONE) 20 MG tablet Take 2-3 tablets (40-60 mg total) by mouth daily with  breakfast. (Patient taking differently: Take 60 mg by mouth daily with breakfast. ) 100 tablet 1  . traMADol (ULTRAM) 50 MG tablet Take 1-2 tablets (50-100 mg total) by mouth every 8 (eight) hours as needed. (Patient taking differently: Take 50-100 mg by mouth every 8 (eight) hours as needed for moderate pain. ) 120 tablet 2  . triamcinolone ointment (KENALOG) 0.1 % APPLY TO AFFECTED AREA TWICE DAILY (Patient taking differently: APPLY TO AFFECTED  AREA ONCE WEEKLY) 80 g 2  . verapamil (CALAN-SR) 240 MG CR tablet Take 1 tablet (240 mg total) by mouth at bedtime. 90 tablet 2  . mupirocin ointment (BACTROBAN) 2 % APPLY TOPICALLY DAILY. (Patient not taking: Reported on 01/11/2016) 22 g 2    Home: [place] expects to be discharged to:: Private residence Living Arrangements: Spouse/significant other Available Help at Discharge: Family Type of Home: House Home Access: Stairs to enter Technical brewer of Steps: 2 Entrance Stairs-Rails: None Home Layout: Two level, Bed/bath upstairs Alternate Level Stairs-Number of Steps: 14 Alternate Level Stairs-Rails: Right Bathroom Shower/Tub: Multimedia programmer: Standard Home Equipment: Environmental consultant - 2 wheels, [employer] - single point, Careers adviser History: Prior Function Level of Independence: Needs assistance Gait / Transfers Assistance Needed: reports could go to computer room with walker from his bed with walker; had to have help (carried) up stairs ADL's / Homemaking Assistance Needed: wife assist with bathing and dressing Functional Status:  Mobility: Bed Mobility Overal bed mobility: Needs Assistance Bed Mobility: Rolling, Sidelying to Sit, Sit to Sidelying Rolling: Supervision Sidelying to sit: Min assist Sit to sidelying: Mod assist General bed mobility comments: cues for technique and assist to lift trunk; to side assist for legs into bed and positioning Transfers Overall transfer level: Needs assistance Equipment used: Rolling walker (2 wheeled) Transfers: Sit to/from Stand Sit to Stand: From elevated surface, Min assist General transfer comment: assist for balance; raised height of bed as has high bed at home Ambulation/Gait Ambulation/Gait assistance: Min assist, Mod assist Ambulation Distance (Feet): 22 Feet Assistive device: Rolling walker (2 wheeled) Gait Pattern/deviations: Step-through pattern, Trunk flexed, Shuffle, Decreased  stride length, Wide base of support General Gait Details: cues throughout for trunk extension; pt self limited due to LE fatigue, relies heavily on UE's on walker for ambulation in room    ADL:    Cognition: Cognition Overall Cognitive Status: Within Functional Limits for tasks assessed Orientation Level: Oriented X4 Cognition Arousal/Alertness: Awake/alert Behavior During Therapy: Anxious Overall Cognitive Status: Within Functional Limits for tasks assessed  Blood pressure (!) 108/52, pulse 88, temperature 98.8 F (37.1 C), temperature source Oral, resp. rate 20, height 6' (1.829 m), weight 75.7 kg (166 lb 14.2 oz), SpO2 96 %. Physical Exam  Neurological:  UE strength 5/5. RLE: 4/5 HF,KE, ADF/PF. LLE: 4-/5 HF, 4/5 KE and 3/5 APF, 1+ADF. Decreased sensation below ankle left foot. Intact sensation RLE.   Skin: Skin is warm.  Back incision is dressed. Patient would not turn enough in the bed to examine sacral decubitus due to pain  Psychiatric: He has a normal mood and affect. His behavior is normal. Judgment and thought content normal.    Lab Results Last 24 Hours       Results for orders placed or performed during the hospital encounter of 01/11/16 (from the past 24 hour(s))  Basic metabolic panel     Status: Abnormal   Collection Time: 01/11/16 12:18 PM  Result Value Ref Range   Sodium 137 135 - 145  mmol/L   Potassium 2.5 (LL) 3.5 - 5.1 mmol/L   Chloride 101 101 - 111 mmol/L   CO2 26 22 - 32 mmol/L   Glucose, Bld 87 65 - 99 mg/dL   BUN 25 (H) 6 - 20 mg/dL   Creatinine, Ser 1.86 (H) 0.61 - 1.24 mg/dL   Calcium 8.5 (L) 8.9 - 10.3 mg/dL   GFR calc non Af Amer 33 (L) >60 mL/min   GFR calc Af Amer 39 (L) >60 mL/min   Anion gap 10 5 - 15  CBC     Status: Abnormal   Collection Time: 01/11/16 12:18 PM  Result Value Ref Range   WBC 17.4 (H) 4.0 - 10.5 K/uL   RBC 2.75 (L) 4.22 - 5.81 MIL/uL   Hemoglobin 9.1 (L) 13.0 - 17.0 g/dL   HCT 26.3 (L) 39.0 - 52.0  %   MCV 95.6 78.0 - 100.0 fL   MCH 33.1 26.0 - 34.0 pg   MCHC 34.6 30.0 - 36.0 g/dL   RDW 15.2 11.5 - 15.5 %   Platelets 279 150 - 400 K/uL  Surgical pcr screen     Status: None   Collection Time: 01/11/16 12:18 PM  Result Value Ref Range   MRSA, PCR NEGATIVE NEGATIVE   Staphylococcus aureus NEGATIVE NEGATIVE  Prepare RBC     Status: None   Collection Time: 01/11/16  4:00 PM  Result Value Ref Range   Order Confirmation ORDER PROCESSED BY BLOOD BANK   Type and screen     Status: None   Collection Time: 01/11/16  4:00 PM  Result Value Ref Range   ABO/RH(D) A NEG    Antibody Screen NEG    Sample Expiration 01/14/2016    Unit Number [id number]    Blood Component Type RED CELLS,LR    Unit division 00    Status of Unit ISSUED,FINAL    Transfusion Status OK TO TRANSFUSE    Crossmatch Result Compatible    Unit Number [id number]    Blood Component Type RED CELLS,LR    Unit division 00    Status of Unit ISSUED,FINAL    Transfusion Status OK TO TRANSFUSE    Crossmatch Result Compatible   ABO/Rh     Status: None   Collection Time: 01/11/16  4:00 PM  Result Value Ref Range   ABO/RH(D) A NEG   Magnesium     Status: None   Collection Time: 01/11/16  6:05 PM  Result Value Ref Range   Magnesium 1.7 1.7 - 2.4 mg/dL  Basic metabolic panel     Status: Abnormal   Collection Time: 01/11/16 11:27 PM  Result Value Ref Range   Sodium 136 135 - 145 mmol/L   Potassium 3.6 3.5 - 5.1 mmol/L   Chloride 98 (L) 101 - 111 mmol/L   CO2 28 22 - 32 mmol/L   Glucose, Bld 201 (H) 65 - 99 mg/dL   BUN 23 (H) 6 - 20 mg/dL   Creatinine, Ser 1.76 (H) 0.61 - 1.24 mg/dL   Calcium 7.6 (L) 8.9 - 10.3 mg/dL   GFR calc non Af Amer 36 (L) >60 mL/min   GFR calc Af Amer 41 (L) >60 mL/min   Anion gap 10 5 - 15  Comprehensive metabolic panel     Status: Abnormal   Collection Time: 01/12/16  5:04 AM  Result Value Ref Range   Sodium 138 135 - 145  mmol/L   Potassium 2.7 (LL) 3.5 - 5.1 mmol/L   Chloride  102 101 - 111 mmol/L   CO2 28 22 - 32 mmol/L   Glucose, Bld 88 65 - 99 mg/dL   BUN 21 (H) 6 - 20 mg/dL   Creatinine, Ser 1.68 (H) 0.61 - 1.24 mg/dL   Calcium 7.8 (L) 8.9 - 10.3 mg/dL   Total Protein 4.4 (L) 6.5 - 8.1 g/dL   Albumin 2.6 (L) 3.5 - 5.0 g/dL   AST 19 15 - 41 U/L   ALT 18 17 - 63 U/L   Alkaline Phosphatase 46 38 - 126 U/L   Total Bilirubin 0.5 0.3 - 1.2 mg/dL   GFR calc non Af Amer 38 (L) >60 mL/min   GFR calc Af Amer 44 (L) >60 mL/min   Anion gap 8 5 - 15      Imaging Results (Last 48 hours)  Dg Lumbar Spine 2-3 Views  Result Date: 01/11/2016 CLINICAL DATA:  Lumbar spine surgery. EXAM: LUMBAR SPINE - 2-3 VIEW COMPARISON:  12/30/2015 and MRI 11/28/2015 FINDINGS: Two cross-table lateral views of the lumbar spine were obtained. Again noted is multilevel disc disease with marked disc space narrowing at L3-L4. The image labeled #1 demonstrates surgical hardware posterior to L3-L4. The second image has a surgical marker posterior to L2 and S1. IMPRESSION: Surgical marking for lumbar spine surgery. Electronically Signed   By: Markus Daft M.D.   On: 01/11/2016 19:12   Dg Chest Port 1 View  Result Date: 01/12/2016 CLINICAL DATA:  Elevated white blood cell count postop lumbar spine surgery EXAM: PORTABLE CHEST 1 VIEW COMPARISON:  Chest x-ray of 04/18/2014 FINDINGS: The lungs are not well aerated and there is subtle nodularity throughout the lungs. Inflammatory or infectious process cannot be excluded and followup two-view chest x-ray or CT the chest is recommended. No effusion is seen. Mediastinal and hilar contours are unremarkable. The heart is within normal limits in size. No bony abnormality is seen. IMPRESSION: Vague nodularity throughout the lungs could being infectious or inflammatory and followup two-view chest x-ray or CT of the chest is recommended Electronically Signed   By: Ivar Drape M.D.   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 Does the need for close, 24 hr/day medical supervision in concert with the patient's rehab needs make it unreasonable for this patient to be served in a less intensive setting? Yes 2. Co-Morbidities requiring supervision/potential complications: htn, anemia, pain 3. Due to bladder management, bowel management, safety, skin/wound care, disease management, medication administration, pain management and patient education, does the patient require 24 hr/day rehab nursing? Yes 4. Does the patient require coordinated care of a physician, rehab nurse, PT (1-2 hrs/day, 5 days/week) and OT (1-2 hrs/day, 5 days/week) to address physical and functional deficits in the context of the above medical diagnosis(es)? Yes Addressing deficits in the following areas: balance, endurance, locomotion, strength, transferring, bowel/bladder control, bathing, dressing, feeding, grooming, toileting and psychosocial support 5. Can the patient actively participate in an intensive therapy program of at least 3 hrs of therapy per day at least 5 days per week? Yes 6. The potential for patient to make measurable gains while on inpatient rehab is excellent 7. Anticipated functional outcomes upon discharge from inpatient rehab are modified independent  with PT, modified independent with OT, n/a with SLP. 8. Estimated rehab length of stay to reach the above functional goals is: 7-11 days 9. Does the patient have adequate social supports and living environment to accommodate these discharge functional goals?  Yes 10. Anticipated D/C setting: Home 11. Anticipated post D/C treatments: HH therapy and Outpatient therapy 12. Overall Rehab/Functional Prognosis: excellent  RECOMMENDATIONS: This patient's condition is appropriate for continued rehabilitative care in the following setting:  CIR Patient has agreed to participate in recommended program. Yes Note that insurance prior authorization may be required for reimbursement for recommended care.  Comment: Rehab Admissions Coordinator to follow up.  Thanks,  Meredith Staggers, MD, Mellody Drown    Cathlyn Parsons., PA-C 01/12/2016    Revision History                        Routing History

## 2016-01-13 NOTE — Care Management Note (Signed)
Case Management Note  Patient Details  Name: [name] MRN: [id number] Date of Birth: 05/28/1940  Subjective/Objective:                    Action/Plan: Pt discharging to CIR today. No further needs per CM.   Expected Discharge Date:                  Expected Discharge Plan:  [place]  In-House Referral:     Discharge planning Services     Post Acute Care Choice:    Choice offered to:     DME Arranged:    DME Agency:     HH Arranged:    [place] Agency:     Status of Service:  Completed, signed off  If discussed at [employer] of Stay Meetings, dates discussed:    Additional Comments:  Pollie Friar, RN 01/13/2016, 11:40 AM

## 2016-01-13 NOTE — Progress Notes (Signed)
Patient ID: Marvin Morrison, male   DOB: Dec 23, 1940, 76 y.o.   MRN: [id number] Patient and family arrived from [facility] with RN and patient belongings. Patient oriented to unit, rehab process, fall prevention plan, rehab safety plan, health resource notebook, and rehab schedule. Air mattress overlay ordered and outside of patient room however, patient refuses to be transfer tonight. Patient given 2 Norco and 500mg  Robaxin for 9/10 pain at 1735. Patient then given 650mg  tylenol at [place] for 5/10 pain. Report given to night shift RN at shift change.

## 2016-01-13 NOTE — Progress Notes (Signed)
Patient was transferred to inpatient rehab. Wife traveled with the patient his new room. Report was called.

## 2016-01-13 NOTE — H&P (Signed)
Physical Medicine and Rehabilitation Admission H&P    Chief complaint: Back pain  HPI: Marvin Morrison is a 76 y.o. right handed male with history of hypertension, chronic renal insufficiency stage III creatinine 1.68. per chart review patient lives with wife. He has essentially been wheelchair and bed bound for 3 months with back pain radiating to the lower extremities as well as bilateral buttock and sacral ulcer. His wife works during the day. 2 level home with bedroom upstairs. 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 No change with conservative care. Underwent laminectomy L-2-L5 with decompression of L2-3-4 and L5 and S1 nerve roots. Discectomy L5-S1 01/11/2016 per Dr. Ellene Route. Hospital course pain management.WOC follow-up of skin care of decubitus. No plan for back bracing. Acute blood loss anemia 9.1 and monitored. Bouts of urinary retention bladder scanned for 533 mL's requiring intermittent catheterization. Urinalysis negative nitrite. Hypokalemia 2.7 requiring potassium supplement. Physical and occupational therapy evaluations completed with recommendations of physical medicine rehabilitation consult. Patient was admitted for comprehensive rehabilitation program  Review of Systems  Constitutional: Negative for chills and fever.  HENT: Negative for ear pain, hearing loss and tinnitus.   Eyes: Negative for blurred vision and double vision.  Respiratory: Positive for shortness of breath. Negative for cough.   Cardiovascular: Negative for chest pain and palpitations.  Gastrointestinal: Positive for constipation.  Genitourinary: Positive for urgency. Negative for dysuria and hematuria.  Musculoskeletal: Positive for back pain and myalgias.  Skin: Negative for rash.  Neurological: Positive for weakness. Negative for seizures and loss of consciousness.    All other systems reviewed and are negative.  Past Medical History:  Diagnosis Date  . Arthritis   . Chronic kidney disease   . Colitis   . Diarrhea   . Eczema   . Elevated glucose   . HTN (hypertension)   . Hyperlipidemia   . Pneumonia   . Shingles 2009   Past Surgical History:  Procedure Laterality Date  . LUMBAR LAMINECTOMY/DECOMPRESSION MICRODISCECTOMY N/A 01/11/2016   Procedure: Lumbar two- three, Lumbar three- four, Lumbar four- five, Lumbar five-Sacral one Laminectomy;  Surgeon: Kristeen Miss, MD;  Location: [place];  Service: Neurosurgery;  Laterality: N/A;  L2-3 L3-4 L4-5 L5-S1 Laminectomy  . TONSILLECTOMY     Age 25   Family History  Problem Relation Age of Onset  . Hypertension Mother   . Hyperlipidemia Father   . Hypertension Father    Social History:  reports that he has never smoked. He has never used smokeless tobacco. He reports that he does not drink alcohol or use drugs. Allergies:  Allergies  Allergen Reactions  . Gabapentin Other (See Comments)    DIZZY FALLS    Medications Prior to Admission  Medication Sig Dispense Refill  . acyclovir (ZOVIRAX) 400 MG tablet Take 1 tablet (400 mg total) by mouth 2 (two) times daily. 60 tablet 11  . ferrous sulfate 325 (65 FE) MG tablet TAKE 1 TABLET BY MOUTH EVERY DAY 30 tablet 5  . ondansetron (ZOFRAN) 4 MG tablet TAKE 1 TABLET BY MOUTH EVERY 8 HOURS AS NEEDED FOR NAUSEA AND VOMITING 30 tablet 5  . pantoprazole (PROTONIX) 40 MG tablet Take 1 tablet (40 mg total) by mouth daily. 90 tablet 2  . predniSONE (DELTASONE) 20 MG tablet Take 2-3 tablets (40-60 mg total) by mouth daily with breakfast. (Patient taking differently: Take 60 mg by mouth daily  with breakfast. ) 100 tablet 1  . traMADol (ULTRAM) 50 MG tablet Take 1-2 tablets (50-100 mg total) by mouth every 8 (eight) hours as needed. (Patient taking differently: Take 50-100 mg by mouth every 8 (eight) hours as needed for moderate pain. ) 120 tablet 2  . triamcinolone  ointment (KENALOG) 0.1 % APPLY TO AFFECTED AREA TWICE DAILY (Patient taking differently: APPLY TO AFFECTED AREA ONCE WEEKLY) 80 g 2  . verapamil (CALAN-SR) 240 MG CR tablet Take 1 tablet (240 mg total) by mouth at bedtime. 90 tablet 2  . mupirocin ointment (BACTROBAN) 2 % APPLY TOPICALLY DAILY. (Patient not taking: Reported on 01/11/2016) 22 g 2    Home: [place] expects to be discharged to:: Private residence Living Arrangements: Spouse/significant other Available Help at Discharge: Family Type of Home: House Home Access: Stairs to enter Technical brewer of Steps: 2 Entrance Stairs-Rails: None Home Layout: Two level, Bed/bath upstairs Alternate Level Stairs-Number of Steps: 14 Alternate Level Stairs-Rails: Right Bathroom Shower/Tub: Multimedia programmer: Standard Bathroom Accessibility: Yes Home Equipment: Environmental consultant - 2 wheels, Cane - single point, Shower seat Additional Comments: Pt has been staying upstairs and having people carry him up and down the stairs.   Functional History: Prior Function Level of Independence: Needs assistance Gait / Transfers Assistance Needed: reports could go to computer room with walker from his bed with walker; had to have help (carried) up stairs ADL's / Homemaking Assistance Needed: wife assist with bathing and dressing  Functional Status:  Mobility: Bed Mobility Overal bed mobility: Needs Assistance Bed Mobility: Rolling, Sidelying to Sit, Sit to Sidelying Rolling: Supervision Sidelying to sit: Min assist Sit to sidelying: Mod assist General bed mobility comments: cues for technique and assist to lift trunk; to side assist for legs into bed and positioning Transfers Overall transfer level: Needs assistance Equipment used: Rolling walker (2 wheeled) Transfers: Sit to/from Stand Sit to Stand: From elevated surface, Min assist General transfer comment: assist for balance; raised height of bed as has high bed at  home Ambulation/Gait Ambulation/Gait assistance: Min assist, Mod assist Ambulation Distance (Feet): 22 Feet Assistive device: Rolling walker (2 wheeled) Gait Pattern/deviations: Step-through pattern, Trunk flexed, Shuffle, Decreased stride length, Wide base of support General Gait Details: cues throughout for trunk extension; pt self limited due to LE fatigue, relies heavily on UE's on walker for ambulation in room    ADL: ADL Overall ADL's : Needs assistance/impaired Grooming: Set up, Sitting, Supervision/safety Upper Body Bathing: Minimal assistance, Cueing for safety, Sitting Lower Body Bathing: Maximal assistance, Sit to/from stand Upper Body Dressing : Minimal assistance, Sitting Lower Body Dressing: Maximal assistance, Sit to/from stand Toilet Transfer: Minimal assistance, Moderate assistance, Ambulation, RW, +2 for safety/equipment Toileting- Clothing Manipulation and Hygiene: Moderate assistance, Sit to/from stand Functional mobility during ADLs: Minimal assistance, Moderate assistance, +2 for safety/equipment, Rolling walker General ADL Comments: Pt educated on back precautions during ADL. Walking with back bent and would straighten when cues given but pt returned to incorrect posture regularly.  Cognition: Cognition Overall Cognitive Status: Within Functional Limits for tasks assessed Orientation Level: Oriented X4 Cognition Arousal/Alertness: Awake/alert Behavior During Therapy: Anxious Overall Cognitive Status: Within Functional Limits for tasks assessed  Physical Exam: Blood pressure (!) 152/90, pulse 95, temperature 98.1 F (36.7 C), temperature source Oral, resp. rate 20, height 6' (1.829 m), weight 75.7 kg (166 lb 14.2 oz), SpO2 95 %. Physical Exam  HENT:  Head: Normocephalic.  Eyes: EOM are normal. Left eye exhibits no discharge.  Neck: Normal range of motion. Neck supple. No thyromegaly present.  Cardiovascular: Normal rate and regular rhythm.   Respiratory:  Effort normal and breath sounds normal. No respiratory distress. He has no wheezes.  GI: Soft. Bowel sounds are normal. He exhibits no distension. There is no tenderness.  Musculoskeletal: He exhibits tenderness. He exhibits no edema.  Neurological: He is alert.  Normal mood and affect  B/l UE strength 4+/5 B/l LE: 4+/5 HF,KE, 4-/5 ADF/PF. (right stronger than left)  Skin:  5 x 2 x 1 cm stage II ulcer left and right buttocks. No odor. Moderate yellow drainage with dressing in place  Psychiatric: He has a normal mood and affect. His behavior is normal.    Results for orders placed or performed during the hospital encounter of 01/11/16 (from the past 48 hour(s))  Basic metabolic panel     Status: Abnormal   Collection Time: 01/11/16 12:18 PM  Result Value Ref Range   Sodium 137 135 - 145 mmol/L   Potassium 2.5 (LL) 3.5 - 5.1 mmol/L    Comment: CRITICAL RESULT CALLED TO, READ BACK BY AND VERIFIED WITH: M.ROSENBERGER,RN 1300 01/11/16 CLARK,S    Chloride 101 101 - 111 mmol/L   CO2 26 22 - 32 mmol/L   Glucose, Bld 87 65 - 99 mg/dL   BUN 25 (H) 6 - 20 mg/dL   Creatinine, Ser 1.86 (H) 0.61 - 1.24 mg/dL   Calcium 8.5 (L) 8.9 - 10.3 mg/dL   GFR calc non Af Amer 33 (L) >60 mL/min   GFR calc Af Amer 39 (L) >60 mL/min    Comment: (NOTE) The eGFR has been calculated using the CKD EPI equation. This calculation has not been validated in all clinical situations. eGFR's persistently <60 mL/min signify possible Chronic Kidney Disease.    Anion gap 10 5 - 15  CBC     Status: Abnormal   Collection Time: 01/11/16 12:18 PM  Result Value Ref Range   WBC 17.4 (H) 4.0 - 10.5 K/uL   RBC 2.75 (L) 4.22 - 5.81 MIL/uL   Hemoglobin 9.1 (L) 13.0 - 17.0 g/dL   HCT 26.3 (L) 39.0 - 52.0 %   MCV 95.6 78.0 - 100.0 fL   MCH 33.1 26.0 - 34.0 pg   MCHC 34.6 30.0 - 36.0 g/dL   RDW 15.2 11.5 - 15.5 %   Platelets 279 150 - 400 K/uL  Surgical pcr screen     Status: None   Collection Time: 01/11/16 12:18 PM    Result Value Ref Range   MRSA, PCR NEGATIVE NEGATIVE   Staphylococcus aureus NEGATIVE NEGATIVE    Comment:        The Xpert SA Assay (FDA approved for NASAL specimens in patients over 16 years of age), is one component of a comprehensive surveillance program.  Test performance has been validated by [facility] for patients greater than or equal to 12 year old. It is not intended to diagnose infection nor to guide or monitor treatment.   Prepare RBC     Status: None   Collection Time: 01/11/16  4:00 PM  Result Value Ref Range   Order Confirmation ORDER PROCESSED BY BLOOD BANK   Type and screen     Status: None   Collection Time: 01/11/16  4:00 PM  Result Value Ref Range   ABO/RH(D) A NEG    Antibody Screen NEG    Sample Expiration 01/14/2016    Unit Number [id number]    Blood Component Type  RED CELLS,LR    Unit division 00    Status of Unit ISSUED,FINAL    Transfusion Status OK TO TRANSFUSE    Crossmatch Result Compatible    Unit Number [id number]    Blood Component Type RED CELLS,LR    Unit division 00    Status of Unit ISSUED,FINAL    Transfusion Status OK TO TRANSFUSE    Crossmatch Result Compatible   ABO/Rh     Status: None   Collection Time: 01/11/16  4:00 PM  Result Value Ref Range   ABO/RH(D) A NEG   Magnesium     Status: None   Collection Time: 01/11/16  6:05 PM  Result Value Ref Range   Magnesium 1.7 1.7 - 2.4 mg/dL  Basic metabolic panel     Status: Abnormal   Collection Time: 01/11/16 11:27 PM  Result Value Ref Range   Sodium 136 135 - 145 mmol/L   Potassium 3.6 3.5 - 5.1 mmol/L    Comment: DELTA CHECK NOTED   Chloride 98 (L) 101 - 111 mmol/L   CO2 28 22 - 32 mmol/L   Glucose, Bld 201 (H) 65 - 99 mg/dL   BUN 23 (H) 6 - 20 mg/dL   Creatinine, Ser 1.76 (H) 0.61 - 1.24 mg/dL   Calcium 7.6 (L) 8.9 - 10.3 mg/dL   GFR calc non Af Amer 36 (L) >60 mL/min   GFR calc Af Amer 41 (L) >60 mL/min    Comment: (NOTE) The eGFR has been calculated using  the CKD EPI equation. This calculation has not been validated in all clinical situations. eGFR's persistently <60 mL/min signify possible Chronic Kidney Disease.    Anion gap 10 5 - 15  Comprehensive metabolic panel     Status: Abnormal   Collection Time: 01/12/16  5:04 AM  Result Value Ref Range   Sodium 138 135 - 145 mmol/L   Potassium 2.7 (LL) 3.5 - 5.1 mmol/L    Comment: CRITICAL RESULT CALLED TO, READ BACK BY AND VERIFIED WITH: [facility] 01/12/16 0608 WAYK    Chloride 102 101 - 111 mmol/L   CO2 28 22 - 32 mmol/L   Glucose, Bld 88 65 - 99 mg/dL   BUN 21 (H) 6 - 20 mg/dL   Creatinine, Ser 1.68 (H) 0.61 - 1.24 mg/dL   Calcium 7.8 (L) 8.9 - 10.3 mg/dL   Total Protein 4.4 (L) 6.5 - 8.1 g/dL   Albumin 2.6 (L) 3.5 - 5.0 g/dL   AST 19 15 - 41 U/L   ALT 18 17 - 63 U/L   Alkaline Phosphatase 46 38 - 126 U/L   Total Bilirubin 0.5 0.3 - 1.2 mg/dL   GFR calc non Af Amer 38 (L) >60 mL/min   GFR calc Af Amer 44 (L) >60 mL/min    Comment: (NOTE) The eGFR has been calculated using the CKD EPI equation. This calculation has not been validated in all clinical situations. eGFR's persistently <60 mL/min signify possible Chronic Kidney Disease.    Anion gap 8 5 - 15  Urinalysis, Routine w reflex microscopic     Status: Abnormal   Collection Time: 01/12/16  6:39 PM  Result Value Ref Range   Color, Urine YELLOW YELLOW   APPearance CLEAR CLEAR   Specific Gravity, Urine 1.015 1.005 - 1.030   pH 5.0 5.0 - 8.0   Glucose, UA NEGATIVE NEGATIVE mg/dL   Hgb urine dipstick MODERATE (A) NEGATIVE   Bilirubin Urine NEGATIVE NEGATIVE   Ketones, ur NEGATIVE  NEGATIVE mg/dL   Protein, ur 30 (A) NEGATIVE mg/dL   Nitrite NEGATIVE NEGATIVE   Leukocytes, UA SMALL (A) NEGATIVE   RBC / HPF TOO NUMEROUS TO COUNT 0 - 5 RBC/hpf   WBC, UA 6-30 0 - 5 WBC/hpf   Bacteria, UA RARE (A) NONE SEEN   Squamous Epithelial / LPF NONE SEEN NONE SEEN   Hyaline Casts, UA PRESENT    Dg Lumbar Spine 2-3 Views  Result  Date: 01/11/2016 CLINICAL DATA:  Lumbar spine surgery. EXAM: LUMBAR SPINE - 2-3 VIEW COMPARISON:  12/30/2015 and MRI 11/28/2015 FINDINGS: Two cross-table lateral views of the lumbar spine were obtained. Again noted is multilevel disc disease with marked disc space narrowing at L3-L4. The image labeled #1 demonstrates surgical hardware posterior to L3-L4. The second image has a surgical marker posterior to L2 and S1. IMPRESSION: Surgical marking for lumbar spine surgery. Electronically Signed   By: Markus Daft M.D.   On: 01/11/2016 19:12   Dg Chest Port 1 View  Result Date: 01/12/2016 CLINICAL DATA:  Elevated white blood cell count postop lumbar spine surgery EXAM: PORTABLE CHEST 1 VIEW COMPARISON:  Chest x-ray of 04/18/2014 FINDINGS: The lungs are not well aerated and there is subtle nodularity throughout the lungs. Inflammatory or infectious process cannot be excluded and followup two-view chest x-ray or CT the chest is recommended. No effusion is seen. Mediastinal and hilar contours are unremarkable. The heart is within normal limits in size. No bony abnormality is seen. IMPRESSION: Vague nodularity throughout the lungs could being infectious or inflammatory and followup two-view chest x-ray or CT of the chest is recommended Electronically Signed   By: Ivar Drape M.D.   On: 01/12/2016 11:13       Medical Problem List and Plan: 1.  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  DVT Prophylaxis/Anticoagulation: SCDs. Check vascular study 3. Pain Management: Hydrocodone and Robaxin as needed 4. Acute blood loss anemia. Continue iron supplement. Follow-up CBC 5. Neuropsych: This patient is capable of making decisions on his own behalf. 6. Skin/Wound Care: Bilateral buttocks and sacral ulcer. Follow-up WOC 7. Fluids/Electrolytes/Nutrition: Routine I&O 8. Hypertension. Verapamil 240 mg  daily. Monitor with increased mobility 9. CRI stage III. Creatinine 1.68. Follow-up chemistries 10. Urinary retention. Check PVR 3 11. Hypokalemia. Follow-up chemistries 12. Constipation. Laxative assistance   Post Admission Physician Evaluation: 1. Preadmission assessment reviewed and changes made below. 2. 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 3. Patient is admitted to receive collaborative, interdisciplinary care between the physiatrist, rehab nursing staff, and therapy team. 4. Patient's level of medical complexity and substantial therapy needs in context of that medical necessity cannot be provided at a lesser intensity of care such as a SNF. 5. Patient has experienced substantial functional loss from his/her baseline which was documented above under the "Functional History" and "Functional Status" headings.  Judging by the patient's diagnosis, physical exam, and functional history, the patient has potential for functional progress which will result in measurable gains while on inpatient rehab.  These gains will be of substantial and practical use upon discharge  in facilitating mobility and self-care at the household level. 6. Physiatrist will provide 24 hour management of medical needs as well as oversight of the therapy plan/treatment and provide guidance as appropriate regarding the interaction of the two. 7. The Preadmission Screening has been reviewed and patient status is unchanged unless  otherwise stated above. 8. 24 hour rehab nursing will assist with bladder management, safety, skin/wound care, disease management, pain management and patient education  and help integrate therapy concepts, techniques,education, etc. 9. PT will assess and treat for/with: Lower extremity strength, range of motion, stamina, balance, functional mobility, safety, adaptive techniques and  equipment, woundcare, coping skills, pain control, education.   Goals are: Mod I. 10. OT will assess and treat for/with: ADL's, functional mobility, safety, upper extremity strength, adaptive techniques and equipment, wound mgt, ego support, and community reintegration.   Goals are: Mod I. Therapy may not proceed with showering this patient. 11. Case Management and Social Worker will assess and treat for psychological issues and discharge planning. 12. Team conference will be held weekly to assess progress toward goals and to determine barriers to discharge. 13. Patient will receive at least 3 hours of therapy per day at least 5 days per week. 14. ELOS: 8-13 days.       15. Prognosis:  good  Delice Lesch, MD, Mellody Drown Cathlyn Parsons., PA-C 01/13/2016

## 2016-01-13 NOTE — Discharge Summary (Signed)
Physician Discharge Summary  Patient ID: JAHAZIAH CHEESEMAN MRN: [id number] DOB/AGE: 04-Oct-1940 76 y.o.  Admit date: 01/11/2016 Discharge date: 01/13/2016  Admission Diagnoses:Lumbar stenosis. Lower extremity paraparesis. Lumbosacral skin breakdown. Anemia  Discharge Diagnoses: Lumbar stenosis. Lower extremity paraparesis. Lumbosacral skin breakdown. Anemia. Active Problems:   Hyperlipidemia   Essential hypertension   Anemia, iron deficiency   Loss of weight   Lumbar stenosis   Pressure injury of skin   Malnutrition of moderate degree   Discharged Condition: fair  Hospital Course: Patient was admitted to undergo surgical decompression from L2-L5 secondary to severe stenosis. He tolerated surgery well however it was noted that preoperatively he was already anemic with a hemoglobin of 9. Hospitalist consult was obtained and further evaluation is being performed by them. The patient will be transferred to the inpatient rehabilitation unit at [facility] to undergo intensive inpatient rehabilitation.  Consults: Hospitalist  Significant Diagnostic Studies: Preoperative hemoglobin 9. Creatinine 1.7.  Treatments: surgery: Laminectomy L2-L5 with discectomy L4-L5 on the right  Discharge Exam: Blood pressure 134/66, pulse 93, temperature 98.4 F (36.9 C), temperature source Oral, resp. rate 20, height 6' (1.829 m), weight 75.7 kg (166 lb 14.2 oz), SpO2 95 %. Incision is clean and dry. Patient is nonambulatory at this time requires maximal assist to stand erect.  Disposition: Transfer to inpatient rehabilitation at [facility]  Discharge Instructions    Call MD for:  redness, tenderness, or signs of infection (pain, swelling, redness, odor or green/yellow discharge around incision site)    Complete by:  As directed    Call MD for:  severe uncontrolled pain    Complete by:  As directed    Call MD for:  temperature >100.4    Complete by:  As directed    Diet - low sodium heart  healthy    Complete by:  As directed    Incentive spirometry RT    Complete by:  As directed    Increase activity slowly    Complete by:  As directed      Allergies as of 01/13/2016      Reactions   Gabapentin Other (See Comments)   DIZZY FALLS      Medication List    TAKE these medications   acyclovir 400 MG tablet Commonly known as:  ZOVIRAX Take 1 tablet (400 mg total) by mouth 2 (two) times daily.   ferrous sulfate 325 (65 FE) MG tablet TAKE 1 TABLET BY MOUTH EVERY DAY   mupirocin ointment 2 % Commonly known as:  BACTROBAN APPLY TOPICALLY DAILY.   ondansetron 4 MG tablet Commonly known as:  ZOFRAN TAKE 1 TABLET BY MOUTH EVERY 8 HOURS AS NEEDED FOR NAUSEA AND VOMITING   pantoprazole 40 MG tablet Commonly known as:  PROTONIX Take 1 tablet (40 mg total) by mouth daily.   predniSONE 20 MG tablet Commonly known as:  DELTASONE Take 2-3 tablets (40-60 mg total) by mouth daily with breakfast. What changed:  how much to take   traMADol 50 MG tablet Commonly known as:  ULTRAM Take 1-2 tablets (50-100 mg total) by mouth every 8 (eight) hours as needed. What changed:  reasons to take this   triamcinolone ointment 0.1 % Commonly known as:  KENALOG APPLY TO AFFECTED AREA TWICE DAILY What changed:  See the new instructions.   verapamil 240 MG CR tablet Commonly known as:  CALAN-SR Take 1 tablet (240 mg total) by mouth at bedtime.        Signed:  Gwenlyn Hottinger J 01/13/2016, 9:26 AM

## 2016-01-13 NOTE — PMR Pre-admission (Signed)
PMR Admission Coordinator Pre-Admission Assessment  Patient: Marvin Morrison is an 76 y.o., male MRN: [id number] DOB: [birth year] Height: 6' (182.9 cm) Weight: 75.7 kg (166 lb 14.2 oz)              Insurance Information HMO:     PPO:      PCP:      IPA:      80/20:      OTHER:  PRIMARY: Medicare A & B      Policy#: [id number]      Subscriber: Self CM Name:       Phone#:      Fax#:  Pre-Cert#: eligible per [employer]      Employer: Retired  Benefits:  Phone #:      Name:  Eff. Date: A: 09/04/03; B: 08/03/05     Deduct: $1340      Out of Pocket Max: none      Life Max: n/a CIR: 100%      SNF: 100% days 1-20; 80% days 21-200 Outpatient: 80%     Co-Pay: 20% Home Health: 100%      Co-Pay: none DME: 80%     Co-Pay: 20% Providers: patient's choice   Medicaid Application Date:       Case Manager:  Disability Application Date:       Case Worker:   Emergency Contact Information Contact Information    Name Relation Home Work Mobile   [place] Spouse [phone]  [phone]   [place]hu[place], [place] Daughter [phone]       Current Medical History  Patient Admitting Diagnosis: 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  History of Present Illness: Marvin Morrison a 76 y.o.right handed malewith history of hypertension, chronic renal insufficiency stage III creatinine 1.68.per chart review patient lives with wife. He has essentially been wheelchair and bed bound for 3 months with back pain radiating to the lower extremities as well as bilateral buttock and sacral ulcer. His wife works during the day. 2 level home with bedroom upstairs. 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 No change with conservative care. Underwent laminectomy L-2-L5 with decompression of L2-3-4 and L5 and S1 nerve roots. Discectomy L5-S1  01/11/2016 per Dr. Ellene Route. Hospital course pain management.WOCfollow-up of skin care of decubitus. No plan for back bracing. Acute blood loss anemia 9.1 and monitored. Bouts of urinary retention bladder scanned for 533 mL's requiring intermittent catheterization. Urinalysis negative nitrite. Hypokalemia 2.7 requiring potassium supplement. Physical and occupational therapy evaluations completed with recommendations of physical medicine rehabilitation consult. Patient was admitted for comprehensive rehabilitation program 01/13/16.      Past Medical History  Past Medical History:  Diagnosis Date  . Arthritis   . Chronic kidney disease   . Colitis   . Diarrhea   . Eczema   . Elevated glucose   . HTN (hypertension)   . Hyperlipidemia   . Pneumonia   . Shingles 2009    Family History  family history includes Hyperlipidemia in his father; Hypertension in his father and mother.  Prior Rehab/Hospitalizations:  Has the patient had major surgery during 100 days prior to admission? No  Current Medications   Current Facility-Administered Medications:  .  0.9 %  sodium chloride infusion, 250 mL, Intravenous, Continuous, Kristeen Miss, MD .  0.9 % NaCl with KCl 40 mEq / L  infusion, , Intravenous, Continuous, Reyne Dumas, MD, Last Rate: 75 mL/hr  at 01/13/16 0211, 75 mL/hr at 01/13/16 0211 .  acetaminophen (TYLENOL) tablet 650 mg, 650 mg, Oral, Q4H PRN **OR** acetaminophen (TYLENOL) suppository 650 mg, 650 mg, Rectal, Q4H PRN, Kristeen Miss, MD .  acyclovir (ZOVIRAX) tablet 400 mg, 400 mg, Oral, BID, Kristeen Miss, MD, 400 mg at 01/13/16 1116 .  alum & mag hydroxide-simeth (MAALOX/MYLANTA) 200-200-20 MG/5ML suspension 30 mL, 30 mL, Oral, Q6H PRN, Kristeen Miss, MD .  bisacodyl (DULCOLAX) suppository 10 mg, 10 mg, Rectal, Daily PRN, Kristeen Miss, MD .  collagenase (SANTYL) ointment, , Topical, Daily, Reyne Dumas, MD .  docusate sodium (COLACE) capsule 100 mg, 100 mg, Oral, BID, Kristeen Miss, MD, 100  mg at 01/13/16 1116 .  ferrous sulfate tablet 325 mg, 325 mg, Oral, TID WC, Velvet Bathe, MD, 325 mg at 01/13/16 1227 .  HYDROcodone-acetaminophen (NORCO/VICODIN) 5-325 MG per tablet 1-2 tablet, 1-2 tablet, Oral, Q4H PRN, Kristeen Miss, MD, 1 tablet at 01/12/16 1440 .  lactose free nutrition (BOOST PLUS) liquid 237 mL, 237 mL, Oral, TID BM, Reyne Dumas, MD, 237 mL at 01/13/16 1117 .  menthol-cetylpyridinium (CEPACOL) lozenge 3 mg, 1 lozenge, Oral, PRN **OR** phenol (CHLORASEPTIC) mouth spray 1 spray, 1 spray, Mouth/Throat, PRN, Kristeen Miss, MD .  methocarbamol (ROBAXIN) tablet 500 mg, 500 mg, Oral, Q6H PRN, 500 mg at 01/13/16 0823 **OR** methocarbamol (ROBAXIN) 500 mg in dextrose 5 % 50 mL IVPB, 500 mg, Intravenous, Q6H PRN, Kristeen Miss, MD .  morphine 2 MG/ML injection 1-4 mg, 1-4 mg, Intravenous, Q3H PRN, Kristeen Miss, MD, 2 mg at 01/13/16 1256 .  multivitamin with minerals tablet 1 tablet, 1 tablet, Oral, Daily, Reyne Dumas, MD, 1 tablet at 01/13/16 1110 .  ondansetron (ZOFRAN) injection 4 mg, 4 mg, Intravenous, Q4H PRN, Kristeen Miss, MD .  ondansetron [facility]) tablet 4 mg, 4 mg, Oral, Q8H PRN, Kristeen Miss, MD, 4 mg at 01/13/16 1130 .  oxyCODONE-acetaminophen (PERCOCET/ROXICET) 5-325 MG per tablet 1-2 tablet, 1-2 tablet, Oral, Q4H PRN, Kristeen Miss, MD, 1 tablet at 01/13/16 1248 .  pantoprazole (PROTONIX) EC tablet 40 mg, 40 mg, Oral, Daily, Kristeen Miss, MD, 40 mg at 01/13/16 1115 .  polyethylene glycol (MIRALAX / GLYCOLAX) packet 17 g, 17 g, Oral, Daily PRN, Kristeen Miss, MD .  potassium chloride SA (K-DUR,KLOR-CON) CR tablet 40 mEq, 40 mEq, Oral, BID, Reyne Dumas, MD, 40 mEq at 01/13/16 1115 .  senna (SENOKOT) tablet 8.6 mg, 1 tablet, Oral, BID, Kristeen Miss, MD, 8.6 mg at 01/13/16 1112 .  sodium chloride flush (NS) 0.9 % injection 3 mL, 3 mL, Intravenous, Q12H, Kristeen Miss, MD .  sodium chloride flush (NS) 0.9 % injection 3 mL, 3 mL, Intravenous, PRN, Kristeen Miss, MD .  verapamil  (CALAN-SR) CR tablet 240 mg, 240 mg, Oral, QHS, Kristeen Miss, MD, 240 mg at 01/12/16 2232  Patients Current Diet: Diet regular Room service appropriate? Yes; Fluid consistency: Thin Diet - low sodium heart healthy  Precautions / Restrictions Precautions Precautions: Fall, Back Precaution Booklet Issued: No Precaution Comments: Verbally reviewed with pt Restrictions Weight Bearing Restrictions: No   Has the patient had 2 or more falls or a fall with injury in the past year?Yes  Prior Activity Level Limited Community (1-2x/wk): Patient with a slow decline over the past year due to his back issues.  Recently, over the past few months patient has been limited to living on the second floor only and in the past month mostly bed level.  With family assistance he has been able to  make it to medical appointments.  Patient is motivated to be able to walk his [place] again.  Patient also reponds best to one on one interactions with through explaniation.  There have been issues with pain and medication administration timing; patient may benefit from a written chart for what he can have and when.    Home Assistive Devices / Equipment Home Assistive Devices/Equipment: Eyeglasses, Environmental consultant (specify type), Cane (specify quad or straight) Home Equipment: Walker - 2 wheels, Cane - single point, Shower seat  Prior Device Use: Indicate devices/aids used by the patient prior to current illness, exacerbation or injury? Walker and [employer]  Prior Functional Level Prior Function Level of Independence: Needs assistance Gait / Transfers Assistance Needed: reports could go to computer room with walker from his bed with walker; had to have help (carried) up stairs ADL's / Homemaking Assistance Needed: wife assist with bathing and dressing  Self Care: Did the patient need help bathing, dressing, using the toilet or eating? Needed some help  Indoor Mobility: Did the patient need assistance with walking from room  to room (with or without device)? Needed some help  Stairs: Did the patient need assistance with internal or external stairs (with or without device)? Dependent  Functional Cognition: Did the patient need help planning regular tasks such as shopping or remembering to take medications? Independent  Current Functional Level Cognition  Overall Cognitive Status: Within Functional Limits for tasks assessed Orientation Level: Oriented X4    Extremity Assessment (includes Sensation/Coordination)  Upper Extremity Assessment: Generalized weakness  Lower Extremity Assessment: Defer to PT evaluation RLE Deficits / Details: able to lift antigravity, but functionally very weak, strength not formally tested RLE Sensation: decreased light touch, decreased proprioception LLE Deficits / Details: able to lift antigravity, but functionally very weak, strength not formally tested LLE Sensation: decreased light touch, decreased proprioception    ADLs  Overall ADL's : Needs assistance/impaired Grooming: Set up, Sitting, Supervision/safety Upper Body Bathing: Minimal assistance, Cueing for safety, Sitting Lower Body Bathing: Maximal assistance, Sit to/from stand Upper Body Dressing : Minimal assistance, Sitting Lower Body Dressing: Maximal assistance, Sit to/from stand Toilet Transfer: Minimal assistance, Moderate assistance, Ambulation, RW, +2 for safety/equipment Toileting- Clothing Manipulation and Hygiene: Moderate assistance, Sit to/from stand Functional mobility during ADLs: Minimal assistance, Moderate assistance, +2 for safety/equipment, Rolling walker General ADL Comments: Pt educated on back precautions during ADL. Walking with back bent and would straighten when cues given but pt returned to incorrect posture regularly.    Mobility  Overal bed mobility: Needs Assistance Bed Mobility: Rolling, Sidelying to Sit Rolling: Supervision Sidelying to sit: Min assist Sit to sidelying: Mod  assist General bed mobility comments: assist for lifting trunk, cues for hand placement    Transfers  Overall transfer level: Needs assistance Equipment used: Rolling walker (2 wheeled) Transfers: Sit to/from Stand, Stand Pivot Transfers Sit to Stand: Min assist, From elevated surface Stand pivot transfers: Min assist General transfer comment: difficulty pushing up from EOB, needed to have at least one hand on walker due to LE weakness; stood from EOB and pivot with RW to recliner for lunch, encouraged up for at lest 45 minutes    Ambulation / Gait / Stairs / Wheelchair Mobility  Ambulation/Gait Ambulation/Gait assistance: Museum/gallery curator (Feet): 30 Feet Assistive device: Rolling walker (2 wheeled) Gait Pattern/deviations: Step-through pattern, Decreased stride length, Trunk flexed, Shuffle, Wide base of support General Gait Details: unable to extend trunk, LE weakness evident and heavy UE support on walker  Posture / Balance Balance Overall balance assessment: Needs assistance Sitting-balance support: Feet supported, No upper extremity supported Sitting balance-Leahy Scale: Good Standing balance support: Bilateral upper extremity supported Standing balance-Leahy Scale: Poor Standing balance comment: assist with UE support on walker for balance    Special needs/care consideration BiPAP/CPAP: No CPM: No Continuous Drip IV: No Dialysis: No        Life Vest: No Oxygen: No Special Bed: No Trach Size: No Wound Vac (area): No       Skin: Dry all over; Bruising to arms and legs; Abrasion left hand;                           *Location: Left and right buttocks each have a wound; 5X2X.1cm; 30% of the wounds is stage 2 pressure injurry, 70% is yellow slough in patchy areas of unstageable pressure injury inner gluteal fold with partial thickness skin loss fissure related to moisture assiocated skin damage. Wound care RN recommends: Air mattress to decrease pressure, Santyl  for chemical debridement of nonviable tissue.   Bowel mgmt: PTA 01/10/16 Bladder mgmt: 1/9 foley removed with need for in and out cath due to retention   Diabetic mgmt: No     Previous Home Environment Living Arrangements: Spouse/significant other Available Help at Discharge: Family Type of Home: House Home Layout: Two level, Bed/bath upstairs Alternate Level Stairs-Rails: Right Alternate Level Stairs-Number of Steps: 14 Home Access: Stairs to enter Entrance Stairs-Rails: None Entrance Stairs-Number of Steps: 2 Bathroom Shower/Tub: Multimedia programmer: Standard Bathroom Accessibility: Yes How Accessible: Accessible via walker Additional Comments: Pt has been staying upstairs and having people carry him up and down the stairs.  Discharge Living Setting Plans for Discharge Living Setting: Patient's home, Lives with (comment) (spouse: Marvin Morrison ) Type of Home at Discharge: House Discharge Home Layout: Two level, 1/2 bath on main level, Bed/bath upstairs Alternate Level Stairs-Rails: Right Alternate Level Stairs-Number of Steps: 15 steps Discharge Home Access: Stairs to enter Entrance Stairs-Rails: None Entrance Stairs-Number of Steps: 2 steps  Discharge Bathroom Shower/Tub: Walk-in shower Discharge Bathroom Toilet: Standard Discharge Bathroom Accessibility: Yes How Accessible: Accessible via walker Does the patient have any problems obtaining your medications?: No  Social/Family/Support Systems Patient Roles: Spouse, Parent Contact Information: Spouse: Marvin Morrison Anticipated Caregiver: Spouse Anticipated Caregiver's Contact Information: [phone] Ability/Limitations of Caregiver: Intermittent  Caregiver Availability: Intermittent (can check at lunch and evenings like PTA) Discharge Plan Discussed with Primary Caregiver: Yes Is Caregiver In Agreement with Plan?: Yes Does Caregiver/Family have Issues with Lodging/Transportation while Pt is in Rehab?:  No  Goals/Additional Needs Patient/Family Goal for Rehab: PT/OT Mod I  Expected length of stay: 7-11 days  Cultural Considerations: None Dietary Needs: Regular and thin  Equipment Needs: TBD Special Service Needs: None Additional Information: None Pt/Family Agrees to Admission and willing to participate: Yes Program Orientation Provided & Reviewed with Pt/Caregiver Including Roles  & Responsibilities: Yes Additional Information Needs: Medication chart  Information Needs to be Provided By: Nursing    Decrease burden of Care through IP rehab admission: No  Possible need for SNF placement upon discharge: No  Patient Condition: This patient's condition remains as documented in the consult dated 01/13/16, in which the Rehabilitation Physician determined and documented that the patient's condition is appropriate for intensive rehabilitative care in an inpatient rehabilitation facility. Will admit to inpatient rehab today.   Preadmission Screen Completed By:  Gunnar Fusi, 01/13/2016 2:41 PM ______________________________________________________________________   Discussed status  with Dr. Posey Pronto on 01/13/16 at 1454 and received telephone approval for admission today.  Admission Coordinator:  Gunnar Fusi, time 1454/Date 01/13/16

## 2016-01-13 NOTE — Progress Notes (Signed)
Physical Therapy Treatment Patient Details Name: Marvin Morrison MRN: [id number] DOB: 02/07/1940 Today's Date: 01/13/2016    History of Present Illness Marvin Morrison is an 76 y.o. 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  Found to be hypokalemic, malnurished and with HTN.    PT Comments    Patient progressing slowly with mobility with less obvious LE weakness with ambulation and able to tolerate sitting in chair briefly.  Feel continues to be good rehab candidate prior to d/c home with family support.   Follow Up Recommendations  CIR     Equipment Recommendations  None recommended by PT    Recommendations for Other Services       Precautions / Restrictions Precautions Precautions: Fall;Back Required Braces or Orthoses:  (no brace)    Mobility  Bed Mobility Overal bed mobility: Needs Assistance Bed Mobility: Rolling;Sidelying to Sit Rolling: Supervision Sidelying to sit: Min assist       General bed mobility comments: assist for lifting trunk, cues for hand placement  Transfers Overall transfer level: Needs assistance Equipment used: Rolling walker (2 wheeled) Transfers: Sit to/from [facility] Sit to Stand: Min assist;From elevated surface Stand pivot transfers: Min assist       General transfer comment: difficulty pushing up from EOB, needed to have at least one hand on walker due to LE weakness; stood from EOB and pivot with RW to recliner for lunch, encouraged up for at lest 45 minutes  Ambulation/Gait Ambulation/Gait assistance: Min assist Ambulation Distance (Feet): 30 Feet Assistive device: Rolling walker (2 wheeled) Gait Pattern/deviations: Step-through pattern;Decreased stride length;Trunk flexed;Shuffle;Wide base of support     General Gait Details: unable to extend trunk, LE weakness  evident and heavy UE support on walker   Stairs            Wheelchair Mobility    Modified Rankin (Stroke Patients Only)       Balance Overall balance assessment: Needs assistance Sitting-balance support: Feet supported;No upper extremity supported Sitting balance-Leahy Scale: Good     Standing balance support: Bilateral upper extremity supported Standing balance-Leahy Scale: Poor Standing balance comment: assist with UE support on walker for balance                    Cognition Arousal/Alertness: Awake/alert Behavior During Therapy: Anxious Overall Cognitive Status: Within Functional Limits for tasks assessed                      Exercises      General Comments General comments (skin integrity, edema, etc.): wife and daughter in room and encouraging pt to participate and sit up OOB, report plans for transfer to rehab later today.  Patient soiled with BM upon standing, stood for assist with hygiene; noted decubitus on buttocks.      Pertinent Vitals/Pain Faces Pain Scale: Hurts even more Pain Location: back and R leg with movement Pain Descriptors / Indicators: Aching Pain Intervention(s): Limited activity within patient's tolerance;Monitored during session;Repositioned    Home Living                      Prior Function            PT Goals (current goals can now be found in the care plan section) Progress towards PT goals: Progressing toward goals  Frequency    Min 5X/week      PT Plan Current plan remains appropriate    Co-evaluation             End of Session Equipment Utilized During Treatment: Gait belt Activity Tolerance: Patient limited by fatigue Patient left: in chair;with call bell/phone within reach;with family/visitor present     Time: [id number] PT Time Calculation (min) (ACUTE ONLY): 24 min  Charges:  $Gait Training: 8-22 mins $Therapeutic Activity: 8-22 mins                    G Codes:       Reginia Naas February 08, 2016, 1:25 PM  Magda Kiel, [place] 2016/02/08

## 2016-01-14 ENCOUNTER — Inpatient Hospital Stay (HOSPITAL_COMMUNITY): Payer: Medicare Other | Admitting: Occupational Therapy

## 2016-01-14 ENCOUNTER — Inpatient Hospital Stay (HOSPITAL_COMMUNITY): Payer: Medicare Other

## 2016-01-14 ENCOUNTER — Inpatient Hospital Stay (HOSPITAL_COMMUNITY): Payer: Medicare Other | Admitting: Physical Therapy

## 2016-01-14 DIAGNOSIS — R7309 Other abnormal glucose: Secondary | ICD-10-CM

## 2016-01-14 DIAGNOSIS — G8918 Other acute postprocedural pain: Secondary | ICD-10-CM

## 2016-01-14 DIAGNOSIS — M79609 Pain in unspecified limb: Secondary | ICD-10-CM

## 2016-01-14 DIAGNOSIS — M792 Neuralgia and neuritis, unspecified: Secondary | ICD-10-CM

## 2016-01-14 DIAGNOSIS — R300 Dysuria: Secondary | ICD-10-CM

## 2016-01-14 DIAGNOSIS — M5417 Radiculopathy, lumbosacral region: Secondary | ICD-10-CM

## 2016-01-14 DIAGNOSIS — D72820 Lymphocytosis (symptomatic): Secondary | ICD-10-CM

## 2016-01-14 DIAGNOSIS — E8809 Other disorders of plasma-protein metabolism, not elsewhere classified: Secondary | ICD-10-CM

## 2016-01-14 DIAGNOSIS — E876 Hypokalemia: Secondary | ICD-10-CM

## 2016-01-14 DIAGNOSIS — R339 Retention of urine, unspecified: Secondary | ICD-10-CM

## 2016-01-14 DIAGNOSIS — E46 Unspecified protein-calorie malnutrition: Secondary | ICD-10-CM

## 2016-01-14 DIAGNOSIS — R739 Hyperglycemia, unspecified: Secondary | ICD-10-CM

## 2016-01-14 DIAGNOSIS — N183 Chronic kidney disease, stage 3 (moderate): Secondary | ICD-10-CM

## 2016-01-14 DIAGNOSIS — D62 Acute posthemorrhagic anemia: Secondary | ICD-10-CM

## 2016-01-14 DIAGNOSIS — M7989 Other specified soft tissue disorders: Secondary | ICD-10-CM

## 2016-01-14 DIAGNOSIS — I10 Essential (primary) hypertension: Secondary | ICD-10-CM

## 2016-01-14 LAB — COMPREHENSIVE METABOLIC PANEL
ALBUMIN: 2.4 g/dL — AB (ref 3.5–5.0)
ALK PHOS: 65 U/L (ref 38–126)
ALT: 7 U/L — AB (ref 17–63)
AST: 16 U/L (ref 15–41)
Anion gap: 7 (ref 5–15)
BUN: 23 mg/dL — ABNORMAL HIGH (ref 6–20)
CALCIUM: 8.1 mg/dL — AB (ref 8.9–10.3)
CO2: 26 mmol/L (ref 22–32)
CREATININE: 1.54 mg/dL — AB (ref 0.61–1.24)
Chloride: 104 mmol/L (ref 101–111)
GFR calc Af Amer: 48 mL/min — ABNORMAL LOW (ref >60)
GFR calc non Af Amer: 42 mL/min — ABNORMAL LOW (ref >60)
GLUCOSE: 165 mg/dL — AB (ref 65–99)
Potassium: 3.9 mmol/L (ref 3.5–5.1)
SODIUM: 137 mmol/L (ref 135–145)
Total Bilirubin: 0.7 mg/dL (ref 0.3–1.2)
Total Protein: 4.7 g/dL — ABNORMAL LOW (ref 6.5–8.1)

## 2016-01-14 LAB — CBC WITH DIFFERENTIAL/PLATELET
BASOS PCT: 0 %
Basophils Absolute: 0 10*3/uL (ref 0.0–0.1)
EOS ABS: 0.5 10*3/uL (ref 0.0–0.7)
Eosinophils Relative: 4 %
HCT: 30.1 % — ABNORMAL LOW (ref 39.0–52.0)
HEMOGLOBIN: 10 g/dL — AB (ref 13.0–17.0)
LYMPHS ABS: 0.5 10*3/uL — AB (ref 0.7–4.0)
Lymphocytes Relative: 3 %
MCH: 31.3 pg (ref 26.0–34.0)
MCHC: 33.2 g/dL (ref 30.0–36.0)
MCV: 94.4 fL (ref 78.0–100.0)
Monocytes Absolute: 0.6 10*3/uL (ref 0.1–1.0)
Monocytes Relative: 4 %
NEUTROS PCT: 89 %
Neutro Abs: 13.2 10*3/uL — ABNORMAL HIGH (ref 1.7–7.7)
Platelets: 213 10*3/uL (ref 150–400)
RBC: 3.19 MIL/uL — AB (ref 4.22–5.81)
RDW: 15.6 % — ABNORMAL HIGH (ref 11.5–15.5)
WBC: 14.8 10*3/uL — AB (ref 4.0–10.5)

## 2016-01-14 LAB — CYCLIC CITRUL PEPTIDE ANTIBODY, IGG/IGA: CCP Antibodies IgG/IgA: 2 units (ref 0–19)

## 2016-01-14 LAB — RHEUMATOID FACTOR: Rhuematoid fact SerPl-aCnc: <10 IU/mL (ref 0.0–13.9)

## 2016-01-14 MED ORDER — METHOCARBAMOL 750 MG PO TABS
750.0000 mg | ORAL_TABLET | Freq: Three times a day (TID) | ORAL | Status: DC
  Administered 2016-01-14 – 2016-01-15 (×3): 750 mg via ORAL
  Filled 2016-01-14 (×3): qty 1

## 2016-01-14 MED ORDER — NITROFURANTOIN MONOHYD MACRO 100 MG PO CAPS: 100.0000 mg | ORAL_CAPSULE | Freq: Two times a day (BID) | ORAL | Status: DC

## 2016-01-14 MED ORDER — GABAPENTIN 100 MG PO CAPS: 100.0000 mg | ORAL_CAPSULE | Freq: Three times a day (TID) | ORAL | Status: DC

## 2016-01-14 MED ORDER — BISACODYL 10 MG RE SUPP
10.0000 mg | Freq: Once | RECTAL | Status: DC
  Filled 2016-01-14 (×2): qty 1

## 2016-01-14 MED ORDER — OXYCODONE HCL 5 MG PO TABS
10.0000 mg | ORAL_TABLET | Freq: Once | ORAL | Status: AC
  Administered 2016-01-14: 10 mg via ORAL
  Filled 2016-01-14: qty 2

## 2016-01-14 MED ORDER — METHOCARBAMOL 1000 MG/10ML IJ SOLN
500.0000 mg | Freq: Three times a day (TID) | INTRAVENOUS | Status: DC
  Filled 2016-01-14 (×3): qty 5

## 2016-01-14 MED ORDER — ONDANSETRON HCL 4 MG PO TABS
4.0000 mg | ORAL_TABLET | Freq: Every day | ORAL | Status: DC
  Administered 2016-01-15 – 2016-02-06 (×23): 4 mg via ORAL
  Filled 2016-01-14 (×23): qty 1

## 2016-01-14 MED ORDER — CEPHALEXIN 250 MG PO CAPS
500.0000 mg | ORAL_CAPSULE | Freq: Two times a day (BID) | ORAL | Status: DC
  Administered 2016-01-14 – 2016-01-15 (×4): 500 mg via ORAL
  Filled 2016-01-14 (×3): qty 2

## 2016-01-14 MED ORDER — MAGNESIUM HYDROXIDE 400 MG/5ML PO SUSP
30.0000 mL | Freq: Once | ORAL | Status: AC
  Administered 2016-01-14: 30 mL via ORAL
  Filled 2016-01-14: qty 30

## 2016-01-14 MED ORDER — PRO-STAT SUGAR FREE PO LIQD
30.0000 mL | Freq: Two times a day (BID) | ORAL | Status: DC
  Administered 2016-01-14 – 2016-01-21 (×15): 30 mL via ORAL
  Filled 2016-01-14 (×15): qty 30

## 2016-01-14 NOTE — IPOC Note (Signed)
Overall Plan of Care [facility]) Patient Details Name: Marvin Morrison MRN: [id number] DOB: Jan 04, 1940  Admitting Diagnosis: [facility] Problems: Active Problems:   Radiculopathy   Neuropathic pain   Benign essential HTN   Dysuria   Hypoalbuminemia due to protein-calorie malnutrition (HCC)   Lymphocytosis   Hyperglycemia     Functional Problem List: Nursing Behavior, Bladder, Bowel, Endurance, Medication Management, Pain, Safety, Skin Integrity  PT Balance, Endurance, Motor, Nutrition, Safety, Sensory, Perception, Pain  OT Balance, Behavior, Edema, Endurance, Motor, Pain, Safety, Sensory, Skin Integrity  SLP    TR         Basic ADL's: OT Grooming, Bathing, Toileting, Dressing     Advanced  ADL's: OT       Transfers: PT Bed Mobility, Bed to Chair, Car, Chief Operating Officer: PT Ambulation, Emergency planning/management officer, Stairs     Additional Impairments: OT None  SLP        TR      Anticipated Outcomes Item Anticipated Outcome  Self Feeding    Swallowing      Basic self-care  Mod I  Toileting  Mod I   Bathroom Transfers Mod I  Bowel/Bladder  min assist  Transfers  mod I  Locomotion  mod I household ambulator  Communication     Cognition     Pain  <4 on a 0-10 pain scale  Safety/Judgment  mod assist   Therapy Plan: PT Intensity: Minimum of 1-2 x/day ,45 to 90 minutes PT Frequency: 5 out of 7 days PT Duration Estimated Length of Stay: 12-14 days OT Intensity: Minimum of 1-2 x/day, 45 to 90 minutes OT Frequency: 5 out of 7 days OT Duration/Estimated Length of Stay: 10-14         Team Interventions: Nursing Interventions Patient/Family Education, Bladder Management, Bowel Management, Disease Management/Prevention, Pain Management, Medication Management, Skin Care/Wound Management, Discharge Planning, Psychosocial Support  PT interventions Ambulation/gait training, Training and development officer, Community reintegration, Disease  management/prevention, Discharge planning, DME/adaptive equipment instruction, Functional electrical stimulation, Functional mobility training, Neuromuscular re-education, Pain management, Patient/family education, Psychosocial support, Skin care/wound management, Stair training, Therapeutic Activities, Therapeutic Exercise, UE/LE Strength taining/ROM, UE/LE Coordination activities, Wheelchair propulsion/positioning  OT Interventions Training and development officer, Academic librarian, Discharge planning, DME/adaptive equipment instruction, Functional mobility training, Pain management, Patient/family education, Self Care/advanced ADL retraining, Skin care/wound managment, Therapeutic Activities, Therapeutic Exercise, UE/LE Strength taining/ROM, UE/LE Coordination activities, Wheelchair propulsion/positioning  SLP Interventions    TR Interventions    SW/CM Interventions Discharge Planning, Barrister's clerk, Patient/Family Education    Team Discharge Planning: Destination: PT-Home ,OT- Home , SLP-  Projected Follow-up: PT-Home health PT, OT-  Home health OT, SLP-  Projected Equipment Needs: PT-To be determined, OT- To be determined, SLP-  Equipment Details: PT-pt owns RW and canes, OT-  Patient/family involved in discharge planning: PT- Patient,  OT-Patient, SLP-   MD ELOS: 10-14 days. Medical Rehab Prognosis:  Good Assessment: 76 y.o.right handed malewith history of hypertension, chronic renal insufficiency stage He has essentially been wheelchair and bed bound for 3 months with back pain radiating to the lower extremities as well as bilateral buttock and sacral ulcer. His wife works during the day. 2 level home with bedroom upstairs. 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 No change with conservative care. Underwent laminectomy L-2-L5 with decompression  of L2-3-4 and L5 and S1 nerve roots. Discectomy L5-S1 01/11/2016 per  Dr. Ellene Route. Hospital course pain management.WOCfollow-up of skin care of decubitus. No plan for back bracing. Acute blood loss anemia monitored. Bouts of urinary retention requiring intermittent catheterization. Hypokalemia requiring potassium supplement. Pt with resulting functional deficits with balance, mobility, safety, self care.  Will set goals for Mod I for most tasks with PT/OT, Mod A for safety  See Team Conference Notes for weekly updates to the plan of care

## 2016-01-14 NOTE — Progress Notes (Signed)
[place] PHYSICAL MEDICINE & REHABILITATION     PROGRESS NOTE  Subjective/Complaints:  Pt seen laying in bed this AM working with OT.  Pt notes leg and urinary pain overnight.  Discussed with on-call provider. He also requests for some of his home medications to be restarted.  Wife at bedside, who corrects history.   ROS: +Dysuria, right leg pain. Denies CP, SOB, N/V/D.  Objective: Vital Signs: Blood pressure (!) 155/91, pulse (!) 110, temperature 98.9 F (37.2 C), temperature source Oral, resp. rate 17, height 6' (1.829 m), weight 80.5 kg (177 lb 7.5 oz), SpO2 95 %. Dg Chest 2 View  Result Date: 01/13/2016 CLINICAL DATA:  Elevated white blood cell count following lumbar surgery 2 days ago. EXAM: CHEST  2 VIEW COMPARISON:  Portable chest x-ray of January 12, 2016. FINDINGS: The lungs are adequately inflated. The interstitial markings remain coarse bilaterally similar to those seen yesterday. The heart is top-normal in size. The central pulmonary vascularity is mildly prominent but also stable. There is a small amount of blunting of the posterior costophrenic angles. There is calcification in the wall of the aortic arch. The bony thorax exhibits no acute abnormality. IMPRESSION: Interstitial prominence bilaterally not greatly changed since yesterday's study but significantly changed since April of 2016. This may reflect mild interstitial edema or interstitial pneumonia. There is no alveolar pneumonia. Tiny bilateral pleural effusions layering posteriorly. Thoracic aortic atherosclerosis. Electronically Signed   By: David  Martinique M.D.   On: 01/13/2016 10:14   Dg Chest Port 1 View  Result Date: 01/12/2016 CLINICAL DATA:  Elevated white blood cell count postop lumbar spine surgery EXAM: PORTABLE CHEST 1 VIEW COMPARISON:  Chest x-ray of 04/18/2014 FINDINGS: The lungs are not well aerated and there is subtle nodularity throughout the lungs. Inflammatory or infectious process cannot be excluded and  followup two-view chest x-ray or CT the chest is recommended. No effusion is seen. Mediastinal and hilar contours are unremarkable. The heart is within normal limits in size. No bony abnormality is seen. IMPRESSION: Vague nodularity throughout the lungs could being infectious or inflammatory and followup two-view chest x-ray or CT of the chest is recommended Electronically Signed   By: Ivar Drape M.D.   On: 01/12/2016 11:13    Recent Labs  01/13/16 0712 01/14/16 0700  WBC 12.6* 14.8*  HGB 9.8* 10.0*  HCT 28.6* 30.1*  PLT 202 213    Recent Labs  01/13/16 0712 01/14/16 0700  NA 138 137  K 3.7 3.9  CL 105 104  GLUCOSE 156* 165*  BUN 22* 23*  CREATININE 1.64* 1.54*  CALCIUM 7.8* 8.1*   CBG (last 3)  No results for input(s): GLUCAP in the last 72 hours.  Wt Readings from Last 3 Encounters:  01/13/16 80.5 kg (177 lb 7.5 oz)  01/11/16 75.7 kg (166 lb 14.2 oz)  12/08/15 76.2 kg (168 lb)    Physical Exam:  BP (!) 155/91 (BP Location: Right Arm)   Pulse (!) 110   Temp 98.9 F (37.2 C) (Oral)   Resp 17   Ht 6' (1.829 m)   Wt 80.5 kg (177 lb 7.5 oz)   SpO2 95%   BMI 24.07 kg/m  Gen: NAD. Well-developed.  HENT: Normocephalic. Atraumatic. Eyes: EOM are normal. No discharge. Cardiovascular: Normal rate and regular rhythm.  No JVD. Respiratory: Effort normal and breath sounds normal.  GI: +Distended Musculoskeletal: He exhibits no edema, no tenderness.  Neurological: He is alert.  B/l UE strength 4+/5 B/l LE: 4+/5  HF,KE LLE ADF 3/5 RLE ADF 4/5 Skin:  Stage II ulcer left and right buttocks.  No odor.  Scattered abrasions. Psychiatric: He has a normal mood and affect. His behavior is normal.   Assessment/Plan: 1. 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 Physiatrist is providing close team supervision and 24 hour management of active medical problems listed below. Physiatrist and rehab team continue to assess barriers to discharge/monitor patient progress toward functional and medical goals.  Function:  Bathing Bathing position Bathing activity did not occur: N/A    Bathing parts      Bathing assist        Upper Body Dressing/Undressing Upper body dressing Upper body dressing/undressing activity did not occur: N/A                  Upper body assist        Lower Body Dressing/Undressing Lower body dressing Lower body dressing/undressing activity did not occur: N/A                                Lower body assist        Toileting Toileting Toileting activity did not occur: N/A        Toileting assist     Transfers Chair/bed transfer Chair/bed transfer activity did not occur: N/A           Manufacturing systems engineer          Cognition Comprehension Comprehension assist level: Follows basic conversation/direction with no assist  Expression Expression assist level: Expresses basic needs/ideas: With no assist  Social Interaction Social Interaction assist level: Interacts appropriately with others with medication or extra time (anti-anxiety, antidepressant).  Problem Solving Problem solving assist level: Solves basic 90% of the time/requires cueing < 10% of the time  Memory Memory assist level: More than reasonable amount of time    Medical Problem List and Plan: 1.  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  DVT Prophylaxis/Anticoagulation: SCDs.   Vascular study pending 3. Pain Management: Hydrocodone as needed  Robaxin scheduled 1/11  Gabapentin 100 TID started 1/11 4. Acute blood loss anemia. Continue iron supplement.   Hb  10.0 on 1/11  Cont to monitor 5. Neuropsych: This patient is capable of making decisions on his own behalf. 6. Skin/Wound Care: Bilateral buttocks and sacral ulcer. Follow-up WOC 7. Fluids/Electrolytes/Nutrition: Routine I&O 8. Hypertension. Verapamil 240 mg daily.   Monitor with increased mobility 9. CRI stage III.   Cr 1.54 on 1/11  Cont to monitor 10. Urinary retention.   PVR pending 11. Hypokalemia.   K+ 3.9 on 1/11  Cont to monitor 12. Constipation. Laxative assistance  Will increase bowel reg x1 on 1/11 13. Hyperglycemia  Will cont to monitor 14. Hypoalbuminemia  Supplement initiated 1/11 15. Leukocytosis  WBcs 14.8 on 1/11  UA+, Ucx pending  Empiric macrobid started 1/11   LOS (Days) 1 A FACE TO FACE EVALUATION WAS PERFORMED  Ankit [name] 01/14/2016 8:47 AM

## 2016-01-14 NOTE — Care Management Note (Signed)
[facility] Individual Statement of Services  Patient Name:  Marvin Morrison  Date:  01/14/2016  Welcome to the [facility].  Our goal is to provide you with an individualized program based on your diagnosis and situation, designed to meet your specific needs.  With this comprehensive rehabilitation program, you will be expected to participate in at least 3 hours of rehabilitation therapies Monday-Friday, with modified therapy programming on the weekends.  Your rehabilitation program will include the following services:  Physical Therapy (PT), Occupational Therapy (OT), 24 hour per day rehabilitation nursing, Case Management (Social Worker), Rehabilitation Medicine, Nutrition Services and Pharmacy Services  Weekly team conferences will be held on Wednesday to discuss your progress.  Your Social Worker will talk with you frequently to get your input and to update you on team discussions.  Team conferences with you and your family in attendance may also be held.  Expected length of stay: 12-14 days Overall anticipated outcome: mod/i-except min with toileting  Depending on your progress and recovery, your program may change. Your Social Worker will coordinate services and will keep you informed of any changes. Your Social Worker's name and contact numbers are listed  below.  The following services may also be recommended but are not provided by the [facility]:  [place] will be made to provide these services after discharge if needed.  Arrangements include referral to agencies that provide these services.  Your insurance has been verified to be:  Medicare Your primary doctor is:  Tyrone Apple Plotnikov  Pertinent information will be shared with your doctor and your insurance company.  Social Worker:  Ovidio Kin, [place] or ([phone]  Information  discussed with and copy given to patient by: [name], 01/14/2016, 10:08 AM

## 2016-01-14 NOTE — Evaluation (Signed)
Physical Therapy Assessment and Plan  Patient Details  Name: Marvin Morrison MRN: [id number] Date of Birth: September 26, 1940  PT Diagnosis: Abnormal posture, Abnormality of gait, Coordination disorder, Difficulty walking, Impaired sensation, Low back pain, Muscle weakness and Pain in bilateral lower extremities Rehab Potential: Good ELOS: 12-14 days   Today's Date: 01/14/2016 PT Individual Time: 2025-4270 and 1500-1608 PT Individual Time Calculation (min): 77 min and 68 min    Problem List: Patient Active Problem List   Diagnosis Date Noted  . Neuropathic pain   . Benign essential HTN   . Dysuria   . Hypoalbuminemia due to protein-calorie malnutrition ([place])   . Lymphocytosis   . Hyperglycemia   . Radiculopathy 01/13/2016  . Surgery, elective   . Post-operative pain   . Acute blood loss anemia   . Stage 3 chronic kidney disease   . Urinary retention   . Hypokalemia   . Constipation due to pain medication   . Pressure injury of skin 01/12/2016  . Malnutrition of moderate degree 01/12/2016  . Lumbar stenosis 01/11/2016  . Gait disorder 11/18/2015  . Spinal stenosis of lumbar region 10/28/2015  . Nausea with vomiting 04/19/2014  . Loss of weight 05/18/2012  . Well adult exam 05/13/2011  . 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 CRI (chronic renal insufficiency) 07/13/2010  . Anemia due to other cause 04/06/2010  . Anemia, iron deficiency 04/06/2010  . DIARRHEA, CHRONIC 03/27/2009  . 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 Hyperlipidemia 02/01/2008  . ABNORMAL GLUCOSE NEC 02/01/2008  . Weakness 11/19/2007  . SHINGLES 11/17/2007  . Essential hypertension 11/09/2006    Past Medical History:  Past Medical History:  Diagnosis Date  . Arthritis   . Chronic kidney disease   . Colitis   . Diarrhea   . Eczema   . Elevated glucose   . HTN (hypertension)   . Hyperlipidemia   . Pneumonia   . Shingles 2009   Past Surgical History:  Past Surgical History:  Procedure Laterality  Date  . LUMBAR LAMINECTOMY/DECOMPRESSION MICRODISCECTOMY N/A 01/11/2016   Procedure: Lumbar two- three, Lumbar three- four, Lumbar four- five, Lumbar five-Sacral one Laminectomy;  Surgeon: Kristeen Miss, MD;  Location: [facility];  Service: Neurosurgery;  Laterality: N/A;  L2-3 L3-4 L4-5 L5-S1 Laminectomy  . TONSILLECTOMY     Age 76    Assessment & Plan Clinical Impression: Marvin Morrison a 76 y.o.right handed malewith history of hypertension, chronic renal insufficiency stage III creatinine 1.68.per chart review patient lives with wife. He has essentially been wheelchair and bed bound for 3 months with back pain radiating to the lower extremities as well as bilateral buttock and sacral ulcer. His wife works during the day. 2 level home with bedroom upstairs. Presented 01/11/2016 after recent MRI demonstrated advanced spondylosis at multiple levels with severe stenosis/radiculopathy at L4-5, L3-4 and L2-3 with disc herniation at L5-S1. No change with conservative care. Underwent laminectomy L-2-L5 with decompression of L2-3-4 and L5 and S1 nerve roots. Discectomy L5-S1 01/11/2016 per Dr. Ellene Morrison. Hospital course pain management.WOCfollow-up of skin care of decubitus. No plan for back bracing. Acute blood loss anemia 9.1 and monitored. Bouts of urinary retention bladder scanned for 533 mL's requiring intermittent catheterization. Urinalysis negative nitrite. Hypokalemia 2.7 requiring potassium supplement. Patient transferred to CIR on 01/13/2016.   Patient currently requires min to mod with mobility secondary to muscle weakness and muscle joint tightness, decreased cardiorespiratoy endurance, impaired timing and sequencing and unbalanced muscle activation and decreased standing balance, decreased postural  control, decreased balance strategies and difficulty maintaining precautions.  Prior to hospitalization, patient was max with mobility (primarily bed bound) and lived with Spouse in a House home.  Home access  is 2Stairs to enter.  Patient will benefit from skilled PT intervention to maximize safe functional mobility, minimize fall risk and decrease caregiver burden for planned discharge home with intermittent assist.  Anticipate patient will benefit from follow up [facility] at discharge.  PT - End of Session Activity Tolerance: Decreased this session;Tolerates 10 - 20 min activity with multiple rests Endurance Deficit: Yes Endurance Deficit Description: limited by pain and fatigue PT Assessment Rehab Potential (ACUTE/IP ONLY): Good Barriers to Discharge: Inaccessible home environment;Decreased caregiver support PT Patient demonstrates impairments in the following area(s): Balance;Endurance;Motor;Nutrition;Safety;Sensory;Perception;Pain PT Transfers Functional Problem(s): Bed Mobility;Bed to Chair;Car;Furniture PT Locomotion Functional Problem(s): Ambulation;Wheelchair Mobility;Stairs PT Plan PT Intensity: Minimum of 1-2 x/day ,45 to 90 minutes PT Frequency: 5 out of 7 days PT Duration Estimated Length of Stay: 12-14 days PT Treatment/Interventions: Ambulation/gait training;Balance/vestibular training;Community reintegration;Disease management/prevention;Discharge planning;DME/adaptive equipment instruction;Functional electrical stimulation;Functional mobility training;Neuromuscular re-education;Pain management;Patient/family education;Psychosocial support;Skin care/wound management;Stair training;Therapeutic Activities;Therapeutic Exercise;UE/LE Strength taining/ROM;UE/LE Coordination activities;Wheelchair propulsion/positioning PT Transfers Anticipated Outcome(s): mod I PT Locomotion Anticipated Outcome(s): mod I household ambulator PT Recommendation Recommendations for Other Services: Neuropsych consult;Therapeutic Recreation consult Therapeutic Recreation Interventions: Pet therapy Follow Up Recommendations: Home health PT Patient destination: Home Equipment Recommended: To be determined Equipment  Details: pt owns RW and canes  Skilled Therapeutic Intervention Treatment 1: Skilled therapeutic intervention initiated after completion of evaluation. Discussed with patient falls risk, safety within room, focus of therapy during stay, possible length of stay, goals, and follow-up therapy. Patient limited throughout session by LE and back pain and fatigue, requiring max encouragement to participate. Patient with frequent urinary urgency, attempted urinal multiple times and able to void once at end of session. Patient requires min-mod A overall using RW and max A for one 6" step using 2 rails. Patient left semi reclined in bed with all needs in reach.  Treatment 2: Patient in bed reporting intermittent 10/10 urinary and BLE pain and fatigue and requires encouragement throughout session for participation. Session focused on bed mobility using rails with HOB raised with supervision-mod A to lift BLE on to bed, sitting balance EOB with supervision while donning shoes total A, stand pivot transfers using RW with min A and increased effort and cues for safe hand placement and keeping RW closer to body, simulated car transfer to sedan height using RW with max A and assist to bring BLE in/out of car, and wheelchair propulsion using BUE x 100 ft with supervision. Upon returning to bed, patient noted to be incontinent of bowel and unaware. Performed rolling with supervision for total A hygiene and nurse tech/nurse notified of buttock wound dressing soiled, patient left in bed with nurse tech present.   PT Evaluation Precautions/Restrictions Precautions Precautions: Fall;Back Precaution Booklet Issued: No Restrictions Weight Bearing Restrictions: No Other Position/Activity Restrictions: Back precautions General Chart Reviewed: Yes Family/Caregiver Present: No  Pain Pain Assessment Pain Assessment: 0-10 Pain Score: 0-No pain Pain Location: Leg (R thigh) Pain Orientation: Right Pain Onset: Sudden Home  Living/Prior Functioning Home Living Living Arrangements: Spouse/significant other Available Help at Discharge: Family;Available PRN/intermittently Type of Home: House Home Access: Stairs to enter [employer] of Steps: 2 Entrance Stairs-Rails: None Home Layout: Two level;Bed/bath upstairs Alternate Level Stairs-Number of Steps: 15 Alternate Level Stairs-Rails: Right Bathroom Shower/Tub: Multimedia programmer: Standard Bathroom Accessibility: Yes Additional Comments: Pt stays on  second floor  Lives With: Spouse Prior Function Level of Independence: Requires assistive device for independence;Independent with transfers;Independent with gait (walker)  Able to Take Stairs?: No (neighbor would help assist patient up and down stairs fro doctor appointments) Driving: No Vocation: Retired Biomedical scientist: Ambulance person;  Leisure: Hobbies-yes (Comment) Comments: hobbies are finances Vision/Perception    No change from baseline  Cognition Overall Cognitive Status: Within Functional Limits for tasks assessed Arousal/Alertness: Awake/alert Memory: Appears intact Sensation Sensation Light Touch: Impaired Detail Light Touch Impaired Details: Impaired RLE;Impaired LLE Proprioception: Impaired Detail Proprioception Impaired Details: Impaired RLE;Impaired LLE Coordination Gross Motor Movements are Fluid and Coordinated: No Fine Motor Movements are Fluid and Coordinated: Yes Coordination and Movement Description: pt limited by deconditioning and neuropathic and surgical pain Motor  Motor Motor: Abnormal postural alignment and control  Mobility Bed Mobility Bed Mobility: Rolling Right;Rolling Left;Supine to Sit;Sit to Supine Rolling Right: 5: Supervision;With rail Rolling Right Details: Verbal cues for sequencing Rolling Left: 5: Supervision;With rail Rolling Left Details: Verbal cues for sequencing Supine to Sit: 3: Mod assist;With rails;HOB elevated Supine  to Sit Details: Verbal cues for sequencing;Tactile cues for sequencing;Manual facilitation for placement;Manual facilitation for weight shifting Sit to Supine: 4: Min assist;With rail Transfers Transfers: Yes Stand Pivot Transfers: 3: Mod assist Stand Pivot Transfer Details: Manual facilitation for weight shifting;Verbal cues for sequencing;Tactile cues for sequencing Locomotion  Ambulation Ambulation: Yes Ambulation/Gait Assistance: 3: Mod assist Ambulation Distance (Feet): 33 Feet Assistive device: Rolling walker Ambulation/Gait Assistance Details: Verbal cues for sequencing;Manual facilitation for weight shifting Gait Gait: Yes Gait Pattern: Decreased stance time - left;Decreased stance time - right;Decreased stride length;Step-through pattern;Trunk flexed Stairs / Additional Locomotion Stairs: Yes Stairs Assistance: 2: Max Industrial/product designer Assistance Details: Verbal cues for sequencing;Manual facilitation for weight shifting Stairs Assistance Details (indicate cue type and reason): bilateral rails used Stair Management Technique: Two rails Number of Stairs: 1 Wheelchair Mobility Wheelchair Mobility: Yes Wheelchair Assistance: 5: Investment banker, operational Details: Verbal cues for sequencing;Verbal cues for Marketing executive: Both upper extremities Wheelchair Parts Management: Supervision/cueing Distance: 100  Trunk/Postural Assessment  Cervical Assessment Cervical Assessment: Exceptions to [facility] (forward head position) Thoracic Assessment Thoracic Assessment: Exceptions to [facility] (increased kyphotic posture) Lumbar Assessment Lumbar Assessment: Exceptions to [facility] (increased posterior tilt of pelvis; reduced lumbar extension) Postural Control Postural Control: Deficits on evaluation Head Control: forward lean and trunk posture with standing   Balance Balance Balance Assessed: Yes Static Standing Balance Static Standing - Balance Support: Bilateral upper  extremity supported;During functional activity Static Standing - Level of Assistance: 4: Min assist Extremity Assessment  RUE Assessment RUE Assessment: Within Functional Limits LUE Assessment LUE Assessment: Within Functional Limits RLE Assessment RLE Assessment: Exceptions to [facility] RLE Strength RLE Overall Strength: Deficits RLE Overall Strength Comments: WFL hip flexion and knee extension, 3+/5 knee flexion and ankle DF/PF LLE Assessment LLE Assessment: Exceptions to [facility] LLE Strength LLE Overall Strength: Deficits LLE Overall Strength Comments: WFL hip flexion and knee extension, 3+/5 knee flexion and ankle DF/PF   See Function Navigator for Current Functional Status.   Refer to Care Plan for Long Term Goals  Recommendations for other services: Neuropsych and Therapeutic Recreation  Pet therapy  Discharge Criteria: Patient will be discharged from PT if patient refuses treatment 3 consecutive times without medical reason, if treatment goals not met, if there is a change in medical status, if patient makes no progress towards goals or if patient is discharged from hospital.  The above assessment, treatment plan, treatment  alternatives and goals were discussed and mutually agreed upon: by patient  Nakaiya Beddow, Murray Hodgkins 01/14/2016, 10:56 AM

## 2016-01-14 NOTE — Plan of Care (Signed)
Problem: RH BOWEL ELIMINATION Goal: RH STG MANAGE BOWEL WITH ASSISTANCE STG Manage Bowel with mod Assistance.   Outcome: Not Progressing Incontinent

## 2016-01-14 NOTE — Progress Notes (Signed)
VASCULAR LAB PRELIMINARY  PRELIMINARY  PRELIMINARY  PRELIMINARY  Bilateral lower extremity venous duplex completed.    Preliminary report:  Bilateral:  No evidence of DVT, superficial thrombosis, or Baker's Cyst.   Deosha Werden, RVS 01/14/2016, 7:51 PM

## 2016-01-14 NOTE — Consult Note (Signed)
WOC consult requested for bilat buttocks.  This was already performed on 1/9; refer to previous assessment and plan of care for unstageable and stage 2 pressure injuries, which were present on admission. Santyl has been ordered for the bedside nurses to apply daily to provide enzymatic debridement of nonviable tissue.  WOC will plan to re-assess wounds weekly. Julien Girt MSN, RN, [place], [place], [place]

## 2016-01-14 NOTE — Progress Notes (Signed)
Social Work Assessment and Plan Social Work Assessment and Plan  Patient Details  Name: Marvin Morrison MRN: [id number] Date of Birth: 04-11-1940  Today's Date: 01/14/2016  Problem List:  Patient Active Problem List   Diagnosis Date Noted  . Neuropathic pain   . Benign essential HTN   . Dysuria   . Hypoalbuminemia due to protein-calorie malnutrition ([place])   . Lymphocytosis   . Hyperglycemia   . Radiculopathy 01/13/2016  . Surgery, elective   . Post-operative pain   . Acute blood loss anemia   . Stage 3 chronic kidney disease   . Urinary retention   . Hypokalemia   . Constipation due to pain medication   . Pressure injury of skin 01/12/2016  . Malnutrition of moderate degree 01/12/2016  . Lumbar stenosis 01/11/2016  . Gait disorder 11/18/2015  . Spinal stenosis of lumbar region 10/28/2015  . Nausea with vomiting 04/19/2014  . Loss of weight 05/18/2012  . Well adult exam 05/13/2011  . 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 CRI (chronic renal insufficiency) 07/13/2010  . Anemia due to other cause 04/06/2010  . Anemia, iron deficiency 04/06/2010  . DIARRHEA, CHRONIC 03/27/2009  . 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 Hyperlipidemia 02/01/2008  . ABNORMAL GLUCOSE NEC 02/01/2008  . Weakness 11/19/2007  . SHINGLES 11/17/2007  . Essential hypertension 11/09/2006   Past Medical History:  Past Medical History:  Diagnosis Date  . Arthritis   . Chronic kidney disease   . Colitis   . Diarrhea   . Eczema   . Elevated glucose   . HTN (hypertension)   . Hyperlipidemia   . Pneumonia   . Shingles 2009   Past Surgical History:  Past Surgical History:  Procedure Laterality Date  . LUMBAR LAMINECTOMY/DECOMPRESSION MICRODISCECTOMY N/A 01/11/2016   Procedure: Lumbar two- three, Lumbar three- four, Lumbar four- five, Lumbar five-Sacral one Laminectomy;  Surgeon: Kristeen Miss, MD;  Location: [facility];  Service: Neurosurgery;  Laterality: N/A;  L2-3 L3-4 L4-5 L5-S1 Laminectomy  .  TONSILLECTOMY     Age 76   Social History:  reports that he has never smoked. He has never used smokeless tobacco. He reports that he does not drink alcohol or use drugs.  Family / Support Systems Marital Status: Married Patient Roles: Spouse, Parent Spouse/Significant OtherOlin Hauser- [id number]  [phone] Children: [place]-daughter [phone] Other Supports: friends Anticipated Caregiver: Wife Ability/Limitations of Caregiver: WIfe works part time but can check at lunch time on days she works Careers adviser: Evenings only Family Dynamics: Close knit with family, daughter is here to make sure he is ok before going back home. Pt has friends who are supportive, but has managed at home before the surgery and will manage again. He hopes he will have less pain and more mobility before he goes home from here,.  Social History Preferred language: English Religion:  Cultural Background: No issues Education: Secretary/administrator educated Read: Yes Write: Yes Employment Status: Retired Freight forwarder Issues: No issues Guardian/Conservator: none-according to MD pt is capable of making his own decisions while here.   Abuse/Neglect Physical Abuse: Denies Verbal Abuse: Denies Sexual Abuse: Denies Exploitation of patient/patient's resources: Denies Self-Neglect: Denies  Emotional Status Pt's affect, behavior adn adjustment status: Pt is motivated but doesn't want to push himself too hard. He did not sleep well last night and doesn't know if he can do three hours of therapy today. He will do what he can and plans to be independent before going home from here. His  main goal is to walk their dog again. Recent Psychosocial Issues: other health issues-were managed by PCP Pyschiatric History: No history deferred depression due to wante dto watch his financials on tv. He feels he is doing well and doesn't need any intervention.He is here to get stronger and go home. He has his  own way of thinking and this will not change while here. Substance Abuse History: No issues  Patient / Family Perceptions, Expectations & Goals Pt/Family understanding of illness & functional limitations: Pt and wife can explain his back surgery and the treatment plan. Wife very pleased he could come to CIR due to pt refused going to a NH. Both talk with the MD and feel their concerns and questions have been addressed. Premorbid pt/family roles/activities: husband, father, retiree, church member, etc Anticipated changes in roles/activities/participation: resume Pt/family expectations/goals: Pt states: " I need my pain managed then I will be able to do more."  Wife states: " I hope he does well here and will not have to worry about him while I work."  [employer]: None Premorbid Home Care/DME Agencies: Other (Comment) (has DME at home from slow decline in mobility prior to surgery) Transportation available at discharge: Wife  Discharge Planning Living Arrangements: Spouse/significant other Support Systems: Spouse/significant other, Children, Friends/neighbors Type of Residence: Private residence Insurance Resources: Chartered certified accountant Resources: [place] Referred: No Living Expenses: Own Money Management: Spouse, Patient Does the patient have any problems obtaining your medications?: No Home Management: Wife Patient/Family Preliminary Plans: Return home with wif ewho works part time but can check on him during lunch. He was managing at home prior to admission using a walker, but then the pain continued to progress and his mobility suffered. He feels if he could manage before this will be a piece of cake. Social Work Anticipated Follow Up Needs: HH/OP  Clinical Impression Pleasant gentleman who has his own way of thinking and will not be pushed to do more than he feels is necessary. He would like his pain managed and needs  his rest breaks-therapy team aware. Hopefully can reach mod/i Level so safe at home while wife is working. Will await teams' evaluations and work on a safe discharge plan. Encouraged to lay on his side due to sacral wounds, he knows about this.   [name] 01/14/2016, 10:48 AM

## 2016-01-14 NOTE — Evaluation (Signed)
Occupational Therapy Assessment and Plan  Patient Details  Name: Marvin Morrison MRN: [id number] Date of Birth: 02/07/1940  OT Diagnosis: abnormal posture, acute pain, muscle weakness (generalized), pain in spine and swelling of limb Rehab Potential: Rehab Potential (ACUTE ONLY): Good ELOS: 10-14   Today's Date: 01/14/2016 OT Individual Time: 0800-0900 OT Individual Time Calculation (min): 60 min      Problem List: Patient Active Problem List   Diagnosis Date Noted  . Neuropathic pain   . Benign essential HTN   . Dysuria   . Hypoalbuminemia due to protein-calorie malnutrition ([place])   . Lymphocytosis   . Hyperglycemia   . Radiculopathy 01/13/2016  . Surgery, elective   . Post-operative pain   . Acute blood loss anemia   . Stage 3 chronic kidney disease   . Urinary retention   . Hypokalemia   . Constipation due to pain medication   . Pressure injury of skin 01/12/2016  . Malnutrition of moderate degree 01/12/2016  . Lumbar stenosis 01/11/2016  . Gait disorder 11/18/2015  . Spinal stenosis of lumbar region 10/28/2015  . Nausea with vomiting 04/19/2014  . Loss of weight 05/18/2012  . Well adult exam 05/13/2011  . 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 CRI (chronic renal insufficiency) 07/13/2010  . Anemia due to other cause 04/06/2010  . Anemia, iron deficiency 04/06/2010  . DIARRHEA, CHRONIC 03/27/2009  . 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 Hyperlipidemia 02/01/2008  . ABNORMAL GLUCOSE NEC 02/01/2008  . Weakness 11/19/2007  . SHINGLES 11/17/2007  . Essential hypertension 11/09/2006    Past Medical History:  Past Medical History:  Diagnosis Date  . Arthritis   . Chronic kidney disease   . Colitis   . Diarrhea   . Eczema   . Elevated glucose   . HTN (hypertension)   . Hyperlipidemia   . Pneumonia   . Shingles 2009   Past Surgical History:  Past Surgical History:  Procedure Laterality Date  . LUMBAR LAMINECTOMY/DECOMPRESSION MICRODISCECTOMY N/A 01/11/2016    Procedure: Lumbar two- three, Lumbar three- four, Lumbar four- five, Lumbar five-Sacral one Laminectomy;  Surgeon: Kristeen Miss, MD;  Location: [facility];  Service: Neurosurgery;  Laterality: N/A;  L2-3 L3-4 L4-5 L5-S1 Laminectomy  . TONSILLECTOMY     Age 76    Assessment & Plan Clinical Impression: Patient is a 76 y.o. 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 No change with conservative care. Underwent laminectomy L-2-L5 with decompression of L2-3-4 and L5 and S1 nerve roots. Discectomy L5-S1 01/11/2016 per Dr. Ellene Route. Patient transferred to CIR on 01/13/2016 .    Patient currently requires total with basic self-care skills secondary to muscle weakness, decreased coordination and decreased sitting balance, decreased standing balance, decreased postural control, decreased balance strategies and difficulty maintaining precautions.  Prior to onset of severe back pain 3 months ago, pt could complete ADL independently, however,for the past 3 weeks patient has required Moderate assistance from spouse for ADL management.  Patient will benefit from skilled intervention to increase independence with basic self-care skills prior to discharge home with care partner.  Anticipate patient will require intermittent assistance for higher level iADL tasks and follow up home health.  OT - End of Session Endurance Deficit: Yes Endurance Deficit Description: limited by pain and fatigue OT Assessment Rehab Potential (ACUTE ONLY): Good Barriers to Discharge: Inaccessible home  environment Barriers to Discharge Comments: bed/bath upstairs OT Patient demonstrates impairments in the following area(s): Balance;Behavior;Edema;Endurance;Motor;Pain;Safety;Sensory;Skin Integrity OT Basic ADL's Functional Problem(s):  Grooming;Bathing;Toileting;Dressing OT Transfers Functional Problem(s): Toilet OT Additional Impairment(s): None OT Plan OT Intensity: Minimum of 1-2 x/day, 45 to 90 minutes OT Frequency: 5 out of 7 days OT Duration/Estimated Length of Stay: 10-14 OT Treatment/Interventions: Balance/vestibular training;Community reintegration;Discharge planning;DME/adaptive equipment instruction;Functional mobility training;Pain management;Patient/family education;Self Care/advanced ADL retraining;Skin care/wound managment;Therapeutic Activities;Therapeutic Exercise;UE/LE Strength taining/ROM;UE/LE Coordination activities;Wheelchair propulsion/positioning OT Basic Self-Care Anticipated Outcome(s): Mod I OT Toileting Anticipated Outcome(s): Mod I OT Bathroom Transfers Anticipated Outcome(s): Mod I OT Recommendation Recommendations for Other Services: Neuropsych consult;Therapeutic Recreation consult Therapeutic Recreation Interventions: Pet therapy;Stress management Patient destination: Home Follow Up Recommendations: Home health OT Equipment Recommended: To be determined   Skilled Therapeutic Intervention Initial eval completed with treatment provided to address functional  back precautions, pain management, and adapted bathing/dressing skills.Treatment session very limited today 2/2 severe back pain. Pt unable to tolerate OOB activities this morning. Bed level bathing/dressing completed with total A.   OT Evaluation Precautions/Restrictions  Precautions Precautions: Fall;Back Precaution Booklet Issued: No Restrictions Weight Bearing Restrictions: No Pain Pain Assessment Pain Score: 10-Worst pain ever Pain Type: Surgical pain Pain Location: Back Pain Orientation: Lower Pain Descriptors / Indicators: Sore Pain Frequency: Intermittent Pain Onset: Gradual Pain Intervention(s): RN administered pain meds Home Living/Prior Functioning Home Living Family/patient expects to be discharged to::  Private residence Living Arrangements: Spouse/significant other Available Help at Discharge: Family, Available PRN/intermittently Type of Home: House Home Access: Stairs to enter Technical brewer of Steps: 2 Entrance Stairs-Rails: None Home Layout: Two level, Bed/bath upstairs Alternate Level Stairs-Number of Steps: 15 Alternate Level Stairs-Rails: Right Bathroom Shower/Tub: Multimedia programmer: Standard Bathroom Accessibility: Yes Additional Comments: Pt stays on second floor  Lives With: Spouse IADL History Homemaking Responsibilities: No Prior Function Level of Independence: Needs assistance with ADLs, Needs assistance with homemaking (for the past 3 months, independent prior to severe back pain) Bath: Moderate Toileting: Moderate Dressing: Moderate Grooming: Moderate Feeding: Supervision/set-up  Able to Take Stairs?: No (neighbor would help assist patient up and down stairs fro doctor appointments) Driving: No Vocation: Retired Biomedical scientist: Ambulance person;  Leisure: Hobbies-yes (Comment) Comments: hobbies are finances ADL ADL ADL Comments: Please see functional navigator Vision/Perception  Vision- History Baseline Vision/History: Wears glasses Wears Glasses: Distance only Patient Visual Report: No change from baseline Vision- Assessment Vision Assessment?: No apparent visual deficits  Cognition Overall Cognitive Status: Within Functional Limits for tasks assessed Arousal/Alertness: Awake/alert Orientation Level: Person;Place;Situation Year: 2018 Month: January Day of Week: Correct Memory: Appears intact Immediate Memory Recall: Sock;Bed;Blue Memory Recall: Sock;Blue Memory Recall Sock: Without Cue Memory Recall Blue: With Cue Safety/Judgment: Impaired Comments: poor mental flexibility Sensation Sensation Light Touch: Impaired Detail Light Touch Impaired Details: Impaired RLE;Impaired LLE Proprioception: Impaired  Detail Proprioception Impaired Details: Impaired RLE;Impaired LLE Coordination Gross Motor Movements are Fluid and Coordinated: No Fine Motor Movements are Fluid and Coordinated: Yes Coordination and Movement Description: pt limited by deconditioning and neuropathic and surgical pain   Extremity/Trunk Assessment RUE Assessment RUE Assessment: Within Functional Limits LUE Assessment LUE Assessment: Within Functional Limits   See Function Navigator for Current Functional Status.   Refer to Care Plan for Long Term Goals  Recommendations for other services: Neuropsych and Therapeutic Recreation  Pet therapy   Discharge Criteria: Patient will be discharged from OT if patient refuses treatment 3 consecutive times without medical reason, if treatment goals not met, if there is a change  in medical status, if patient makes no progress towards goals or if patient is discharged from hospital.  The above assessment, treatment plan, treatment alternatives and goals were discussed and mutually agreed upon: by patient and by family  Valma Cava 01/14/2016, 8:55 PM

## 2016-01-14 NOTE — Progress Notes (Signed)
Patient information reviewed and entered into eRehab system by Isael Stille, RN, CRRN, PPS Coordinator.  Information including medical coding and functional independence measure will be reviewed and updated through discharge.     Per nursing patient was given "Data Collection Information Summary for Patients in Inpatient Rehabilitation Facilities with attached "Privacy Act Statement-Health Care Records" upon admission.  

## 2016-01-14 NOTE — Progress Notes (Signed)
Initial Nutrition Assessment  DOCUMENTATION CODES:   Not applicable  INTERVENTION:  Continue 30 ml Prostat po BID, each supplement provides 100 kcal and 15 grams of protein.   Continue Boost Plus po TID, each supplement provides 360 kcal and 14 grams of protein.   Encourage adequate PO intake.   NUTRITION DIAGNOSIS:   Increased nutrient needs related to wound healing as evidenced by estimated needs.  GOAL:   Patient will meet greater than or equal to 90% of their needs  MONITOR:   PO intake, Supplement acceptance, Labs, Weight trends, Skin, I & O's  REASON FOR ASSESSMENT:   Malnutrition Screening Tool    ASSESSMENT:   76 y.o. right handed male with history of hypertension, chronic renal insufficiency stage III He has essentially been wheelchair and bed bound for 3 months with back pain radiating to the lower extremities as well as bilateral buttock and sacral ulcer. 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 No change with conservative care. Underwent laminectomy L-2-L5 with decompression of L2-3-4 and L5 and S1 nerve roots. Discectomy L5-S1 01/11/2016.  Pt reports having a lack of appetite due to constipation and feelings of discomfort from a distended abdomen. Meal completion has been 0%. Pt reports he will eat once constipation resolved. Noted dulcolax had been ordered. RD unable to obtain nutrition history as pt was needing to void. Unable to complete Nutrition-Focused physical exam at this time. RD to perform physical exam at next visit. Per weight records, pt with no significant weight loss. Pt currently has Boost Plus and Prostat ordered. Labs and medications reviewed.   Diet Order:  Diet regular Room service appropriate? Yes; Fluid consistency: Thin  Skin:  Wound (see comment) (Unstageable/stage II to buttocks)  Last BM:   1/10  Height:   Ht Readings from Last 1 Encounters:  01/13/16 6' (1.829 m)    Weight:   Wt Readings from Last 1 Encounters:  01/13/16 177 lb 7.5 oz (80.5 kg)    Ideal Body Weight:  80.9 kg  BMI:  Body mass index is 24.07 kg/m.  Estimated Nutritional Needs:   Kcal:  2050-2250  Protein:  105-115 grams  Fluid:  2- 2.2 L/day  EDUCATION NEEDS:   No education needs identified at this time  Corrin Parker, MS, RD, LDN Pager # [phone] After hours/ weekend pager # [phone]

## 2016-01-15 ENCOUNTER — Telehealth: Payer: Self-pay

## 2016-01-15 ENCOUNTER — Inpatient Hospital Stay (HOSPITAL_COMMUNITY): Payer: Medicare Other | Admitting: Occupational Therapy

## 2016-01-15 ENCOUNTER — Inpatient Hospital Stay (HOSPITAL_COMMUNITY): Payer: Medicare Other | Admitting: Physical Therapy

## 2016-01-15 ENCOUNTER — Inpatient Hospital Stay (HOSPITAL_COMMUNITY): Payer: Medicare Other

## 2016-01-15 DIAGNOSIS — K5903 Drug induced constipation: Secondary | ICD-10-CM

## 2016-01-15 LAB — URINE CULTURE: CULTURE: NO GROWTH

## 2016-01-15 MED ORDER — MAGNESIUM HYDROXIDE 400 MG/5ML PO SUSP
30.0000 mL | Freq: Once | ORAL | Status: AC
  Administered 2016-01-15: 30 mL via ORAL
  Filled 2016-01-15: qty 30

## 2016-01-15 MED ORDER — TAMSULOSIN HCL 0.4 MG PO CAPS
0.4000 mg | ORAL_CAPSULE | Freq: Every day | ORAL | Status: DC
  Administered 2016-01-15 – 2016-02-06 (×23): 0.4 mg via ORAL
  Filled 2016-01-15 (×23): qty 1

## 2016-01-15 MED ORDER — BISACODYL 10 MG RE SUPP
10.0000 mg | Freq: Once | RECTAL | Status: DC
  Filled 2016-01-15: qty 1

## 2016-01-15 MED ORDER — VERAPAMIL HCL ER 180 MG PO TBCR
360.0000 mg | EXTENDED_RELEASE_TABLET | Freq: Every day | ORAL | Status: DC
  Administered 2016-01-15 – 2016-01-16 (×2): 360 mg via ORAL
  Filled 2016-01-15 (×2): qty 2

## 2016-01-15 MED ORDER — GABAPENTIN 100 MG PO CAPS
100.0000 mg | ORAL_CAPSULE | Freq: Three times a day (TID) | ORAL | Status: DC
  Administered 2016-01-15 – 2016-01-16 (×5): 100 mg via ORAL
  Filled 2016-01-15 (×5): qty 1

## 2016-01-15 MED ORDER — METHOCARBAMOL 1000 MG/10ML IJ SOLN
500.0000 mg | Freq: Four times a day (QID) | INTRAVENOUS | Status: DC
  Filled 2016-01-15 (×7): qty 5

## 2016-01-15 MED ORDER — METHOCARBAMOL 750 MG PO TABS
750.0000 mg | ORAL_TABLET | Freq: Four times a day (QID) | ORAL | Status: DC
  Administered 2016-01-15 – 2016-01-21 (×24): 750 mg via ORAL
  Filled 2016-01-15 (×24): qty 1

## 2016-01-15 NOTE — Progress Notes (Signed)
Occupational Therapy Session Note  Patient Details  Name: Marvin Morrison MRN: [id number] Date of Birth: 1940-06-16  Today's Date: 01/15/2016  Session 1 OT Individual Time: 0800-0900 OT Individual Time Calculation (min): 60 min   Session 2 OT Individual Time: 2725-3664 OT Individual Time Calculation (min): 86 min   Short Term Goals:Week 1:  OT Short Term Goal 1 (Week 1): Pt will complete LB dressing using ADL AE with Mod A OT Short Term Goal 2 (Week 1): Pt will complete toilet transfers with Mod A and RW OT Short Term Goal 3 (Week 1): Pt will complete sit<>stand with Mod A in preparation for ADL activities.  Skilled Therapeutic Interventions/Progress Updates:  Session 1   1:1 OT session focused on modified bathing/dressing, toilet transfers, and improved sit<>stands. Pt reported 10/10 pain and complaining of distended stomach. Pt agreeable to get on BSC-Max A stand-pivot w/ Rw. Pt successfully moved bowel and bladder, total A for peri-care. Upon sit<>stand for toileting, pt incontinent of bowel, requiring total A again for clean up. Pt required multiple seated rest breaks during toileting and clean up 2/2 fatigue and general weakness. Brief was dependently donned, and pt completed stand-pivot transfer to wc with Max A and Rw. Pt completed grooming wc level at the sink and was left with nurse tech and needs met.   Session 2 Pt supine in bed and incontinent of stool upon OT arrival. NT and RN entered room to assist with cleaning pt and bedding while OT assisted with pt positioning. After rest break, pt completed stand-pivot to w/c with min/mod A + RW . Elevating leg rest applied to R LE to relieve R LE pain and increase time seated in w/c. Self-feeding completed seated in w/c with set-up A, increased UE tremors with fatigue. Pt then completed UE there-ex with stage 3 green thera-band. 3 sets x 10 reps bicep curls,  triceps press, and shoulder flexion. Pt returned to bed in similar fashion to await  x-ray transport.   Therapy Documentation Precautions:  Precautions Precautions: Fall, Back Precaution Booklet Issued: No Restrictions Weight Bearing Restrictions: No Other Position/Activity Restrictions: Back precautions Pain: Pain Assessment Pain Assessment: Faces Pain Score: 6  Faces Pain Scale: Hurts little more Pain Type: Surgical pain Pain Location: Back Pain Orientation: Lower Pain Radiating Towards: right leg Pain Descriptors / Indicators: Aching Pain Frequency: Constant Pain Onset: On-going Patients Stated Pain Goal: 2 Pain Intervention(s): RN administered pain meds Multiple Pain Sites: No ADL: ADL ADL Comments: Please see functional navigator    See Function Navigator for Current Functional Status.   Therapy/Group: Individual Therapy  Valma Cava 01/15/2016, 3:52 PM

## 2016-01-15 NOTE — Evaluation (Signed)
Speech Language Pathology Assessment and Plan  Patient Details  Name: Marvin Morrison MRN: [id number] Date of Birth: 02-Feb-1940  Evaluation Oonly  Today's Date: 01/15/2016 SLP Individual Time: 1700-1735 SLP Individual Time Calculation (min): 35 min    Problem List: Patient Active Problem List   Diagnosis Date Noted  . Neuropathic pain   . Benign essential HTN   . Dysuria   . Hypoalbuminemia due to protein-calorie malnutrition ([place])   . Lymphocytosis   . Hyperglycemia   . Radiculopathy 01/13/2016  . Surgery, elective   . Post-operative pain   . Acute blood loss anemia   . Stage 3 chronic kidney disease   . Urinary retention   . Hypokalemia   . Constipation due to pain medication   . Pressure injury of skin 01/12/2016  . Malnutrition of moderate degree 01/12/2016  . Lumbar stenosis 01/11/2016  . Gait disorder 11/18/2015  . Spinal stenosis of lumbar region 10/28/2015  . Nausea with vomiting 04/19/2014  . Loss of weight 05/18/2012  . Well adult exam 05/13/2011  . 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 CRI (chronic renal insufficiency) 07/13/2010  . Anemia due to other cause 04/06/2010  . Anemia, iron deficiency 04/06/2010  . DIARRHEA, CHRONIC 03/27/2009  . 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 Hyperlipidemia 02/01/2008  . ABNORMAL GLUCOSE NEC 02/01/2008  . Weakness 11/19/2007  . SHINGLES 11/17/2007  . Essential hypertension 11/09/2006   Past Medical History:  Past Medical History:  Diagnosis Date  . Arthritis   . Chronic kidney disease   . Colitis   . Diarrhea   . Eczema   . Elevated glucose   . HTN (hypertension)   . Hyperlipidemia   . Pneumonia   . Shingles 2009   Past Surgical History:  Past Surgical History:  Procedure Laterality Date  . LUMBAR LAMINECTOMY/DECOMPRESSION MICRODISCECTOMY N/A 01/11/2016   Procedure: Lumbar two- three, Lumbar three- four, Lumbar four- five, Lumbar five-Sacral one Laminectomy;  Surgeon: Kristeen Miss, MD;  Location: [place];   Service: Neurosurgery;  Laterality: N/A;  L2-3 L3-4 L4-5 L5-S1 Laminectomy  . TONSILLECTOMY     Age 76    Assessment / Plan / Recommendation Clinical Impression Pt presents with functional cognitive-linguistic abilities as evidenced by score of 27 out of 30 on MOCA 8.1 with score above 26 considered to be within average range. Wife confirms pt's ability and doesn't report any cognitive-linguisitc deficts at this time. Skilled ST services are not indicated at present.      SLP Assessment  Patient does not need any further Speech Lanaguage Pathology Services    Recommendations  Follow up Recommendations: None Equipment Recommended: None recommended by SLP    Pain Pain Assessment Pain Assessment: Faces Pain Score: 2  Faces Pain Scale: Hurts little more Pain Type: Surgical pain Pain Location: Back Pain Orientation: Lower Pain Radiating Towards: right leg Pain Descriptors / Indicators: Aching Pain Frequency: Constant Pain Onset: On-going Patients Stated Pain Goal: 2 Pain Intervention(s): Medication (See eMAR)  Prior Functioning Cognitive/Linguistic Baseline: Within functional limits Type of Home: House  Lives With: Spouse Available Help at Discharge: Family;Available PRN/intermittently Vocation: Retired  Function:   Cognition Comprehension Comprehension assist level: Understands complex 90% of the time/cues 10% of the time  Expression   Expression assist level: Expresses complex 90% of the time/cues < 10% of the time  Technical sales engineer Social Interaction assist level: Interacts appropriately with others with medication or extra time (anti-anxiety, antidepressant).  Problem Solving Problem solving assist level: Solves complex  90% of the time/cues < 10% of the time  Memory Memory assist level: Recognizes or recalls 90% of the time/requires cueing < 10% of the time    Refer to Care Plan for Long Term Goals  Recommendations for other services: None   Discharge Criteria:  Patient will be discharged from SLP if patient refuses treatment 3 consecutive times without medical reason, if treatment goals not met, if there is a change in medical status, if patient makes no progress towards goals or if patient is discharged from hospital.  The above assessment, treatment plan, treatment alternatives and goals were discussed and mutually agreed upon: by patient and by family  Akyla Vavrek 01/15/2016, 5:39 PM

## 2016-01-15 NOTE — Telephone Encounter (Signed)
Pt on TCM List and admitted for elective procedure. Admission Diagnoses:Lumbar stenosis. Lower extremity paraparesis. Lumbosacral skin breakdown. Anemia  Pt admitted into REHAB at [facility].

## 2016-01-15 NOTE — Progress Notes (Signed)
Marvin Morrison PHYSICAL MEDICINE & REHABILITATION     PROGRESS NOTE  Subjective/Complaints:  Marvin Morrison seen laying in bed.  Wife at bedside.  Marvin Morrison refused suppository yesterday.  Spoke with nursing regarding urinary retention, bowel program.  Marvin Morrison also states agreement with plan, including pain management.   ROS: +Dysuria, right leg pain. Denies CP, SOB, N/V/D.  Objective: Vital Signs: Blood pressure (!) 157/86, pulse (!) 108, temperature 98.5 F (36.9 C), temperature source Oral, resp. rate 17, height 6' (1.829 m), weight 80.5 kg (177 lb 7.5 oz), SpO2 98 %. Dg Chest 2 View  Result Date: 01/13/2016 CLINICAL DATA:  Elevated white blood cell count following lumbar surgery 2 days ago. EXAM: CHEST  2 VIEW COMPARISON:  Portable chest x-ray of January 12, 2016. FINDINGS: The lungs are adequately inflated. The interstitial markings remain coarse bilaterally similar to those seen yesterday. The heart is top-normal in size. The central pulmonary vascularity is mildly prominent but also stable. There is a small amount of blunting of the posterior costophrenic angles. There is calcification in the wall of the aortic arch. The bony thorax exhibits no acute abnormality. IMPRESSION: Interstitial prominence bilaterally not greatly changed since yesterday's study but significantly changed since April of 2016. This may reflect mild interstitial edema or interstitial pneumonia. There is no alveolar pneumonia. Tiny bilateral pleural effusions layering posteriorly. Thoracic aortic atherosclerosis. Electronically Signed   By: David  Martinique M.D.   On: 01/13/2016 10:14    Recent Labs  01/13/16 0712 01/14/16 0700  WBC 12.6* 14.8*  HGB 9.8* 10.0*  HCT 28.6* 30.1*  PLT 202 213    Recent Labs  01/13/16 0712 01/14/16 0700  NA 138 137  K 3.7 3.9  CL 105 104  GLUCOSE 156* 165*  BUN 22* 23*  CREATININE 1.64* 1.54*  CALCIUM 7.8* 8.1*   CBG (last 3)  No results for input(s): GLUCAP in the last 72 hours.  Wt Readings from  Last 3 Encounters:  01/13/16 80.5 kg (177 lb 7.5 oz)  01/11/16 75.7 kg (166 lb 14.2 oz)  12/08/15 76.2 kg (168 lb)    Physical Exam:  BP (!) 157/86 (BP Location: Right Arm)   Pulse (!) 108   Temp 98.5 F (36.9 C) (Oral)   Resp 17   Ht 6' (1.829 m)   Wt 80.5 kg (177 lb 7.5 oz)   SpO2 98%   BMI 24.07 kg/m  Gen: NAD. Well-developed.  HENT: Normocephalic. Atraumatic. Eyes: EOM are normal. No discharge. Cardiovascular: Normal rate and regular rhythm.  No JVD. Respiratory: Effort normal and breath sounds normal.  GI: +Distended Musculoskeletal: He exhibits no edema, no tenderness.  Neurological: He is alert.  B/l UE strength 4+/5 B/l LE: 4+/5 HF,KE (stable) LLE ADF 3/5 RLE ADF 4/5 Skin:  Stage II ulcer left and right buttocks - not examined today.  Scattered abrasions. Psychiatric: He has a normal mood and affect. His behavior is normal.   Assessment/Plan: 1. 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 Physiatrist is providing close team supervision and 24 hour management of active medical problems listed below. Physiatrist and rehab team continue to assess barriers to discharge/monitor patient progress toward functional and medical goals.  Function:  Bathing Bathing position Bathing activity did not occur: N/A Position: Wheelchair/chair at sink  Bathing parts Body parts bathed by patient: Right arm, Left arm, Chest, Abdomen Body parts bathed  by helper: Front perineal area, Buttocks, Right upper leg, Left upper leg, Right lower leg, Left lower leg  Bathing assist Assist Level:  (total A)      Upper Body Dressing/Undressing Upper body dressing Upper body dressing/undressing activity did not occur: Refused What is the patient wearing?: Pull over shirt/dress      Pull over shirt/dress - Perfomed by patient: Thread/unthread right sleeve, Thread/unthread left sleeve Pull over shirt/dress - Perfomed by helper: Put head through opening, Pull shirt over trunk        Upper body assist Assist Level: Touching or steadying assistance(Marvin Morrison > 75%)      Lower Body Dressing/Undressing Lower body dressing Lower body dressing/undressing activity did not occur: N/A What is the patient wearing?: Socks, Pants       Pants- Performed by helper: Thread/unthread right pants leg, Thread/unthread left pants leg, Pull pants up/down   Non-skid slipper socks- Performed by helper: Don/doff left sock, Don/doff right sock   Socks - Performed by helper: Don/doff left sock, Don/doff right sock           TED Hose - Performed by helper: Don/doff right TED hose, Don/doff left TED hose  Lower body assist Assist for lower body dressing:  (Total A)      Toileting Toileting Toileting activity did not occur: No continent bowel/bladder event   Toileting steps completed by helper: Adjust clothing prior to toileting, Performs perineal hygiene, Adjust clothing after toileting    Toileting assist Assist level: Touching or steadying assistance (Marvin Morrison.75%)   Transfers Chair/bed transfer Chair/bed transfer activity did not occur: N/A Chair/bed transfer method: Stand pivot Chair/bed transfer assist level: Maximal assist (Marvin Morrison 25 - 49%/lift and lower) Chair/bed transfer assistive device: Armrests, Medical sales representative     Max distance: 33 ft Assist level: Moderate assist (Marvin Morrison 50 - 74%)   Wheelchair   Type: Manual Max wheelchair distance: 100 Assist Level: Supervision or verbal cues  Cognition Comprehension Comprehension assist level: Understands complex 90% of the time/cues 10% of the time  Expression Expression assist level: Expresses complex 90% of the time/cues < 10% of the time  Social Interaction Social Interaction assist level: Interacts appropriately  with others with medication or extra time (anti-anxiety, antidepressant).  Problem Solving Problem solving assist level: Solves complex 90% of the time/cues < 10% of the time  Memory Memory assist level: Recognizes or recalls 75 - 89% of the time/requires cueing 10 - 24% of the time    Medical Problem List and Plan: 1.  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  DVT Prophylaxis/Anticoagulation: SCDs.   Vascular study neg for DVT on 1/11 3. Pain Management: Hydrocodone as needed  Robaxin scheduled 1/11, increased 1/12  Gabapentin 100 TID started 1/12 4. Acute blood loss anemia. Continue iron supplement.   Hb 10.0 on 1/11  Cont to monitor 5. Neuropsych: This patient is capable of making decisions on his own behalf. 6. Skin/Wound Care: Bilateral buttocks and sacral ulcer. Follow-up WOC 7. Fluids/Electrolytes/Nutrition: Routine I&O 8. Hypertension.   Verapamil 240 mg daily, increased to 360 on 1/12.   Monitor with increased mobility 9. CRI stage III.   Cr 1.54 on 1/11  Cont to monitor 10. Urinary retention.   Flomax started 1/11 11. Hypokalemia.   K+ 3.9 on 1/11  Cont to monitor 12. Severe Constipation.   Increase bowel reg x1 on 1/11, Marvin Morrison refused, educated again with  repeat increase followed by KUB, discussed with team 13. Hyperglycemia  Will cont to monitor 14. Hypoalbuminemia  Supplement initiated 1/11 15. Leukocytosis  WBcs 14.8 on 1/11  UA+, Ucx remains pending  Empiric Keflex started 1/11  LOS (Days) 2 A FACE TO FACE EVALUATION WAS PERFORMED  Ankit [name] 01/15/2016 8:33 AM

## 2016-01-15 NOTE — Progress Notes (Signed)
Physical Therapy Session Note  Patient Details  Name: Marvin Morrison MRN: [id number] Date of Birth: 1940-03-06  Today's Date: 01/15/2016 PT Individual Time: 1000-1100 PT Individual Time Calculation (min): 60 min    Short Term Goals: Week 1:  PT Short Term Goal 1 (Week 1): Patient will transfer bed <> wheelchair with supervision. PT Short Term Goal 2 (Week 1): Patient will ambulate 150 ft using RW with supervision. PT Short Term Goal 3 (Week 1): Patient will negotiate up/down 4 stairs with 1 rail and mod A.  PT Short Term Goal 4 (Week 1): Patient will perform bed mobility with supervision.   Skilled Therapeutic Interventions/Progress Updates:    Pt was found in W/C with all needs within reach in room before start of therapy. Pt self-propelled down to rehab gym in his W/C to work on increasing UE strength, endurance, and activity tolerance. W/C mobility required verbal cues for technique and sequence to propel W/C in an energy efficient manner.  Pt performed seated therapeutic exercise to improve LE strength and pain tolerance due to increased fatigue throughout today's session. Seated therapeutic exercise included: 1x10 alternating high knees, 1x10 TKE with 5 sec hold and slow eccentric control bilaterally, and 1x10 hamstring curls with yellow theraband bilaterally. Verbal cues were used with the therapeutic exercise to teach proper sequence of exercises.  Treatment included adjusting a new W/C for the pt to provide proper support, reduce effort required to propel W/C, and Roho cushion for pressure relief for multiple sacral ulcers. Pt self-propelled himself back to room to be positioned into bed. Sit-to-stand transfers throughout treatment were mod assist with verbal cues for proper sequence and hand placement. Stand pivot transfer with the RW to return to bed was touch assist with verbal cues for proper use of DME, handplacement, and sequence. Sit-to-supine at end of treatment was touch assist  with verbal cues for sequencing and body position. Pt was left in his bed with call bell within reach and all needs met.     Due to pt's increased fatigue and surgical pain, pt would benefit from switching to 15 hrs over 7 days to allow adequate rest between therapy sessions.   Therapy Documentation Precautions:  Precautions Precautions: Fall, Back Precaution Booklet Issued: No Restrictions Weight Bearing Restrictions: No Other Position/Activity Restrictions: Back precautions General:    Pain: Pain Assessment Pain Assessment: 0-10 Pain Score: 6  Pain Type: Surgical pain Pain Location: Back Pain Orientation: Lower Pain Descriptors / Indicators: Aching Pain Onset: On-going Pain Intervention(s): Repositioned;Rest Multiple Pain Sites: No   See Function Navigator for Current Functional Status.   Therapy/Group: Individual Therapy  Rosendo Gros 01/15/2016, 12:21 PM

## 2016-01-16 ENCOUNTER — Inpatient Hospital Stay (HOSPITAL_COMMUNITY): Payer: Medicare Other | Admitting: Physical Therapy

## 2016-01-16 DIAGNOSIS — K5901 Slow transit constipation: Secondary | ICD-10-CM

## 2016-01-16 DIAGNOSIS — G479 Sleep disorder, unspecified: Secondary | ICD-10-CM

## 2016-01-16 DIAGNOSIS — R0989 Other specified symptoms and signs involving the circulatory and respiratory systems: Secondary | ICD-10-CM

## 2016-01-16 MED ORDER — TRAZODONE HCL 50 MG PO TABS
50.0000 mg | ORAL_TABLET | Freq: Every evening | ORAL | Status: DC | PRN
  Administered 2016-01-16 – 2016-01-31 (×12): 50 mg via ORAL
  Filled 2016-01-16 (×12): qty 1

## 2016-01-16 NOTE — Progress Notes (Signed)
[facility] PHYSICAL MEDICINE & REHABILITATION     PROGRESS NOTE  Subjective/Complaints:  Pt laying in bed this AM.  Per nursing, he did not sleep well overnight.  Pt denies complaints this AM and is appreciateve because his bowel moved yesterday and he had another BM this AM.  He also notes improvement in pain in [place].  ROS: Denies CP, SOB, N/V/D.  Objective: Vital Signs: Blood pressure 105/62, pulse 88, temperature 99 F (37.2 C), temperature source Oral, resp. rate 17, height 6' (1.829 m), weight 80.5 kg (177 lb 7.5 oz), SpO2 95 %. Dg Abd 1 View  Result Date: 01/15/2016 CLINICAL DATA:  Constipation EXAM: ABDOMEN - 1 VIEW COMPARISON:  None. FINDINGS: There is diffuse stool throughout the colon. There is no bowel dilatation or air-fluid level suggesting bowel obstruction. No free air. 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 IMPRESSION: Diffuse stool throughout colon, consistent with stated diagnosis of constipation. No bowel obstruction or free air evident. Electronically Signed   By: Lowella Grip III M.D.   On: 01/15/2016 16:30    Recent Labs  01/14/16 0700  WBC 14.8*  HGB 10.0*  HCT 30.1*  PLT 213    Recent Labs  01/14/16 0700  NA 137  K 3.9  CL 104  GLUCOSE 165*  BUN 23*  CREATININE 1.54*  CALCIUM 8.1*   CBG (last 3)  No results for input(s): GLUCAP in the last 72 hours.  Wt Readings from Last 3 Encounters:  01/13/16 80.5 kg (177 lb 7.5 oz)  01/11/16 75.7 kg (166 lb 14.2 oz)  12/08/15 76.2 kg (168 lb)    Physical Exam:  BP 105/62 (BP Location: Right Arm)   Pulse 88   Temp 99 F (37.2 C) (Oral)   Resp 17   Ht 6' (1.829 m)   Wt 80.5 kg (177 lb 7.5 oz)   SpO2 95%   BMI 24.07 kg/m  Gen: NAD. Well-developed.  HENT: Normocephalic. Atraumatic. Eyes: EOM are normal. No discharge. Cardiovascular: RRR.  No JVD. Respiratory: Effort normal and breath sounds normal.  GI: +Distended, improving Musculoskeletal: He exhibits no edema, no  tenderness.  Neurological: He is alert.  B/l UE strength 4+/5 B/l LE: 4+/5 HF,KE (stable) LLE ADF 3/5 RLE ADF 4/5 Skin:  Stage II ulcer left and right buttocks - not examined today.  Scattered abrasions. Psychiatric: He has a normal mood and affect. His behavior is normal.   Assessment/Plan: 1. 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 Physiatrist is providing close team supervision and 24 hour management of active medical problems listed below. Physiatrist and rehab team continue to assess barriers to discharge/monitor patient progress toward functional and medical goals.  Function:  Bathing Bathing position Bathing activity did not occur: N/A Position: Wheelchair/chair at sink  Bathing parts Body parts bathed by patient: Right arm, Left arm, Chest, Abdomen Body parts bathed by helper: Front perineal area, Buttocks, Right upper leg, Left upper leg, Right lower leg, Left lower leg  Bathing assist Assist Level:  (total A)      Upper Body Dressing/Undressing Upper body dressing Upper body dressing/undressing activity did not occur: Refused What is the patient wearing?: Pull over shirt/dress     Pull over shirt/dress - Perfomed by patient: Thread/unthread right sleeve, Thread/unthread left sleeve Pull over shirt/dress - Perfomed by helper: Put head through opening, Pull shirt  over trunk        Upper body assist Assist Level: Touching or steadying assistance(Pt > 75%)      Lower Body Dressing/Undressing Lower body dressing Lower body dressing/undressing activity did not occur: N/A What is the patient wearing?: Socks, Pants       Pants- Performed by helper: Thread/unthread right pants leg, Thread/unthread left pants leg, Pull pants up/down   Non-skid slipper  socks- Performed by helper: Don/doff left sock, Don/doff right sock   Socks - Performed by helper: Don/doff left sock, Don/doff right sock           TED Hose - Performed by helper: Don/doff right TED hose, Don/doff left TED hose  Lower body assist Assist for lower body dressing:  (Total A)      Toileting Toileting Toileting activity did not occur: No continent bowel/bladder event   Toileting steps completed by helper: Adjust clothing prior to toileting, Performs perineal hygiene, Adjust clothing after toileting    Toileting assist Assist level: Touching or steadying assistance (Pt.75%)   Transfers Chair/bed transfer Chair/bed transfer activity did not occur: N/A Chair/bed transfer method: Stand pivot Chair/bed transfer assist level: Touching or steadying assistance (Pt > 75%) Chair/bed transfer assistive device: Armrests     Locomotion Ambulation     Max distance: 33 ft Assist level: Moderate assist (Pt 50 - 74%)   Wheelchair   Type: Manual Max wheelchair distance: 324 Assist Level: Supervision or verbal cues  Cognition Comprehension Comprehension assist level: Understands complex 90% of the time/cues 10% of the time  Expression Expression assist level: Expresses complex 90% of the time/cues < 10% of the time  Social Interaction Social Interaction assist level: Interacts appropriately with others with medication or extra time (anti-anxiety, antidepressant).  Problem Solving Problem solving assist level: Solves complex 90% of the time/cues < 10% of the time  Memory Memory assist level: Recognizes or recalls 90% of the time/requires cueing < 10% of the time    Medical Problem List and Plan: 1.  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  DVT Prophylaxis/Anticoagulation: SCDs.   Vascular study neg for DVT on 1/11 3. Pain Management:  Hydrocodone as needed  Robaxin scheduled 1/11, increased 1/12  Gabapentin 100 TID started 1/12  Kpad added 1/12 4. Acute blood loss anemia. Continue iron supplement.   Hb 10.0 on 1/11  Cont to monitor  Labs ordered for Monday 5. Neuropsych: This patient is capable of making decisions on his own behalf. 6. Skin/Wound Care: Bilateral buttocks and sacral ulcer. Follow-up WOC 7. Fluids/Electrolytes/Nutrition: Routine I&O 8. Hypertension.   Verapamil 240 mg daily, increased to 360 on 1/12.   Improved, may need to reduce dose, will cont to monitor 9. CRI stage III.   Cr 1.54 on 1/11  Cont to monitor  Labs ordered for Monday 10. Urinary retention.   Flomax started 1/11 11. Hypokalemia.   K+ 3.9 on 1/11  Cont to monitor  Labs ordered for Monday 12. Constipation.   KUB 1/12 reviewed showing constipation  Improving, cont bowel reg 13. Hyperglycemia  Will cont to monitor 14. Hypoalbuminemia  Supplement initiated 1/11 15. Leukocytosis  Afebrile  WBcs 14.8 on 1/11  UA+, Ucx NG  Empiric Keflex d/ced  Labs ordered for Monday 16. Sleep disturbance  Reported by nursing  Trazodone PRN ordered  LOS (Days) 3 A FACE TO FACE EVALUATION WAS PERFORMED  Cythia Bachtel [name] 01/16/2016 8:53 AM

## 2016-01-16 NOTE — Progress Notes (Signed)
Physical Therapy Session Note  Patient Details  Name: Marvin Morrison MRN: [id number] Date of Birth: Oct 16, 1940  Today's Date: 01/16/2016 PT Individual Time: 0930-1030 PT Individual Time Calculation (min): 60 min    Short Term Goals: Week 1:  PT Short Term Goal 1 (Week 1): Patient will transfer bed <> wheelchair with supervision. PT Short Term Goal 2 (Week 1): Patient will ambulate 150 ft using RW with supervision. PT Short Term Goal 3 (Week 1): Patient will negotiate up/down 4 stairs with 1 rail and mod A.  PT Short Term Goal 4 (Week 1): Patient will perform bed mobility with supervision.   Skilled Therapeutic Interventions/Progress Updates:     Patient received supine in bed and agreeable to PT.   Supine to sitting EOB with supervision assist from PT. Min cues for proper weight shifting to perform reciprocal scooting.   Stand pivot transfer to [facility] with min assist from PT. Min cues for improved posture and step length.   WC mobility through hall [id number] with supervision assist with min cues for doorway management.   Patient notes significant increase in pain in the R LE following WC 10/10. RN made aware and provided pain medication.    Gait in rehab gym x [id number] RW and min assist from PT. Patient had incontinent bowel movement while ambulating. WC mobility back to room x [id number] supervision assist from PT.  Transfer to bed with stand pivot transfer with RW  and min assist from PT for safety.   Rolling in bed to R and L to PT to perform perineal hygiene and don/doff brief.   Patient left in bed with call bell in reach and all needs met.    Therapy Documentation Precautions:  Precautions Precautions: Fall, Back Precaution Booklet Issued: No Restrictions Weight Bearing Restrictions: No Other Position/Activity Restrictions: Back precautions    Pain: Pain Assessment Pain Assessment: 0-10 Pain Score: 10-Worst pain ever Pain Type: Acute pain Pain Location: Leg Pain  Orientation: Right Pain Descriptors / Indicators: Aching Pain Frequency: Constant Pain Onset: On-going Pain Intervention(s): Medication (See eMAR)   See Function Navigator for Current Functional Status.   Therapy/Group: Individual Therapy  AusLorie Phenix13/2018, 12:53 PM

## 2016-01-17 ENCOUNTER — Inpatient Hospital Stay (HOSPITAL_COMMUNITY): Payer: Medicare Other | Admitting: Physical Therapy

## 2016-01-17 ENCOUNTER — Inpatient Hospital Stay (HOSPITAL_COMMUNITY): Payer: Medicare Other | Admitting: Occupational Therapy

## 2016-01-17 MED ORDER — LOPERAMIDE HCL 2 MG PO CAPS
2.0000 mg | ORAL_CAPSULE | Freq: Once | ORAL | Status: AC
  Administered 2016-02-02: 2 mg via ORAL
  Filled 2016-01-17 (×3): qty 1

## 2016-01-17 MED ORDER — GABAPENTIN 300 MG PO CAPS
300.0000 mg | ORAL_CAPSULE | Freq: Three times a day (TID) | ORAL | Status: DC
  Administered 2016-01-17 – 2016-01-19 (×6): 300 mg via ORAL
  Filled 2016-01-17 (×6): qty 1

## 2016-01-17 MED ORDER — VERAPAMIL HCL ER 240 MG PO TBCR
240.0000 mg | EXTENDED_RELEASE_TABLET | Freq: Every day | ORAL | Status: DC
  Administered 2016-01-17 – 2016-02-05 (×20): 240 mg via ORAL
  Filled 2016-01-17 (×20): qty 1

## 2016-01-17 MED ORDER — LIDOCAINE 5 % EX PTCH
1.0000 | MEDICATED_PATCH | CUTANEOUS | Status: DC
  Administered 2016-01-17 – 2016-02-06 (×21): 1 via TRANSDERMAL
  Filled 2016-01-17 (×21): qty 1

## 2016-01-17 MED ORDER — CALCIUM POLYCARBOPHIL 625 MG PO TABS
625.0000 mg | ORAL_TABLET | Freq: Every day | ORAL | Status: DC
  Administered 2016-01-17 – 2016-01-27 (×11): 625 mg via ORAL
  Filled 2016-01-17 (×11): qty 1

## 2016-01-17 NOTE — Progress Notes (Signed)
Physical Therapy Session Note  Patient Details  Name: Marvin Morrison MRN: [id number] Date of Birth: 1940-02-03  Today's Date: 01/17/2016 PT Individual Time: [id number] and [id number] and [id number] PT Individual Time Calculation (min): 58 min and 45 min and 32 min   Short Term Goals: Week 1:  PT Short Term Goal 1 (Week 1): Patient will transfer bed <> wheelchair with supervision. PT Short Term Goal 2 (Week 1): Patient will ambulate 150 ft using RW with supervision. PT Short Term Goal 3 (Week 1): Patient will negotiate up/down 4 stairs with 1 rail and mod A.  PT Short Term Goal 4 (Week 1): Patient will perform bed mobility with supervision.   Skilled Therapeutic Interventions/Progress Updates:    Treatment 1: Pt received in bed with RN present. Pt noted 5/10 pain in R thigh; RN present & administered pain meds. Session focused on bed mobility, transfers, gait, & endurance. Pt rolled L/R with bed rails & supervision with cuing for BLE placement to help maintain back precautions. Pt required min assist for sidelying>sitting to upright trunk. Pt completed sit<>stand transfers with min<>max assist 2/2 posterior loss of balance & required mod assist for stand pivot bed<>w/c as pt with loss of balance & required assist to w/c. Pt propelled w/c room<>gym with BUE & supervision for strength & endurance training. Gait training x 38 ft + 38 ft with RW & mod assist; pt requires cuing for upright posture, to ambulate within base of RW, and to square up to sitting surface before transferring. Pt with ataxic gait, occasional knee buckling, forward flexed posture, and decreased foot clearance LLE. Pt able to recall 2/3 back precautions; therapist re-educated pt on 3/3. Pt requires multiple seated rest breaks between tasks 2/2 fatigue. Pt with incontinent BM and tolerated standing ~1 minute at a time for total assist peri hygiene. At end of session pt left in care of RN & NT.  Treatment 2: Pt received in bed with RN &  wife present. Pt agreeable to tx & noting 4/10 pain in R thigh; provided rest breaks & distraction. Session focused on bed mobility, transfers, standing tolerance, and w/c mobility for endurance purposes. Pt required min assist for supine>sitting EOB and max assist for sit<>stand and stand pivot transfers with RW. Pt with significant posterior lean and decreased postural control. Pt transferred to [facility] with incontinent BM and required total assist for peri-hygiene. Pt able to tolerate standing ~1 minute at a time for hygiene. Once in w/c pt completed grooming tasks (hand washing & teeth brushing per pt's request) with set up assist. Encouraged pt to complete as many tasks on his own as possible. Pt propelled w/c room>gym with BUE & supervision in controlled environment with cuing for technique with task focusing on endurance training. At end of session pt left sitting in w/c in room with all needs within reach & wife present.   Treatment 3: Pt received in bed & agreeable to tx, denying c/o pain. Session focused on bed mobility, transfers, neuro re-ed, coordination, and endurance/strength training. Pt able to transfer supine>sidelying>sitting EOB with use of bed rails & HOB slightly elevated with cuing for log rolling technique. Pt transferred bed>w/c and w/c<>nu-step with RW & mod assist; pt will lose balance back into chair and require significant assistance for eccentric control & a safe landing. Educated pt on importance of safety with transfers. Pt utilized nu-step level 1 x 5 minutes + 4 minutes with all 4 extremities with task focusing on endurance training & coordination  of reciprocal movements. Pt noted feeling as though he would pass out; assisted pt to w/c & back to room. Pt vitals noted below & RN made aware. At end of session pt noted he felt tired & weak but otherwise okay. Pt left sitting in w/c in room with all needs within reach.   Therapy Documentation Precautions:  Precautions Precautions:  Fall, Back Precaution Booklet Issued: No Restrictions Weight Bearing Restrictions: No Other Position/Activity Restrictions: Back precautions   Vital Signs: BP = 111/57 mmHg (sitting, LUE via dinamap) HR = 87 bpm SpO2 = 96%, room air  See Function Navigator for Current Functional Status.   Therapy/Group: Individual Therapy  Waunita Schooner 01/17/2016, 5:17 PM

## 2016-01-17 NOTE — Progress Notes (Signed)
[place] PHYSICAL MEDICINE & REHABILITATION     PROGRESS NOTE  Subjective/Complaints:  Pt seen laying in bed this AM.  He slept well overnight.  He notes continued improvement in bowels.  He also reports right thigh pain again.   ROS: +right thigh pain. Denies CP, SOB, N/V/D.  Objective: Vital Signs: Blood pressure (!) 109/54, pulse 79, temperature 98.2 F (36.8 C), temperature source Oral, resp. rate 17, height 6' (1.829 m), weight 80.5 kg (177 lb 7.5 oz), SpO2 94 %. Dg Abd 1 View  Result Date: 01/15/2016 CLINICAL DATA:  Constipation EXAM: ABDOMEN - 1 VIEW COMPARISON:  None. FINDINGS: There is diffuse stool throughout the colon. There is no bowel dilatation or air-fluid level suggesting bowel obstruction. No free air. 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 IMPRESSION: Diffuse stool throughout colon, consistent with stated diagnosis of constipation. No bowel obstruction or free air evident. Electronically Signed   By: Lowella Grip III M.D.   On: 01/15/2016 16:30   No results for input(s): WBC, HGB, HCT, PLT in the last 72 hours. No results for input(s): NA, K, CL, GLUCOSE, BUN, CREATININE, CALCIUM in the last 72 hours.  Invalid input(s): CO CBG (last 3)  No results for input(s): GLUCAP in the last 72 hours.  Wt Readings from Last 3 Encounters:  01/13/16 80.5 kg (177 lb 7.5 oz)  01/11/16 75.7 kg (166 lb 14.2 oz)  12/08/15 76.2 kg (168 lb)    Physical Exam:  BP (!) 109/54 (BP Location: Right Arm)   Pulse 79   Temp 98.2 F (36.8 C) (Oral)   Resp 17   Ht 6' (1.829 m)   Wt 80.5 kg (177 lb 7.5 oz)   SpO2 94%   BMI 24.07 kg/m  Gen: NAD. Well-developed.  HENT: Normocephalic. Atraumatic. Eyes: EOM are normal. No discharge. Cardiovascular: RRR.  No JVD. Respiratory: Effort normal and breath sounds normal.  GI: Soft, BS+ Musculoskeletal: He exhibits no edema, no tenderness.  Neurological: He is alert.  B/l UE strength 4+/5 B/l LE: 4+/5 HF,KE  (stable) LLE ADF 4/5 RLE ADF 4/5 Skin:  Stage II ulcer left and right buttocks - not examined today.  Scattered abrasions. Psychiatric: He has a normal mood and affect. His behavior is normal.   Assessment/Plan: 1. 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 Physiatrist is providing close team supervision and 24 hour management of active medical problems listed below. Physiatrist and rehab team continue to assess barriers to discharge/monitor patient progress toward functional and medical goals.  Function:  Bathing Bathing position Bathing activity did not occur: N/A Position: Wheelchair/chair at sink  Bathing parts Body parts bathed by patient: Right arm, Left arm, Chest, Abdomen Body parts bathed by helper: Front perineal area, Buttocks, Right upper leg, Left upper leg, Right lower leg, Left lower leg  Bathing assist Assist Level:  (total A)      Upper Body Dressing/Undressing Upper body dressing Upper body dressing/undressing activity did not occur: Refused What is the patient wearing?: Pull over shirt/dress     Pull over shirt/dress - Perfomed by patient: Thread/unthread right sleeve, Thread/unthread left sleeve Pull over shirt/dress - Perfomed by helper: Put head through opening, Pull shirt over trunk        Upper body assist Assist Level: Touching or steadying assistance(Pt > 75%)      Lower Body Dressing/Undressing  Lower body dressing Lower body dressing/undressing activity did not occur: N/A What is the patient wearing?: Socks, Pants       Pants- Performed by helper: Thread/unthread right pants leg, Thread/unthread left pants leg, Pull pants up/down   Non-skid slipper socks- Performed by helper: Don/doff left sock, Don/doff right sock   Socks -  Performed by helper: Don/doff left sock, Don/doff right sock           TED Hose - Performed by helper: Don/doff right TED hose, Don/doff left TED hose  Lower body assist Assist for lower body dressing:  (Total A)      Toileting Toileting Toileting activity did not occur: No continent bowel/bladder event   Toileting steps completed by helper: Adjust clothing prior to toileting, Performs perineal hygiene, Adjust clothing after toileting Toileting Assistive Devices: Grab bar or rail  Toileting assist Assist level: Two helpers   Transfers Chair/bed transfer Chair/bed transfer activity did not occur: N/A Chair/bed transfer method: Stand pivot Chair/bed transfer assist level: Touching or steadying assistance (Pt > 75%) Chair/bed transfer assistive device: Armrests     Locomotion Ambulation     Max distance: 33 ft Assist level: Moderate assist (Pt 50 - 74%)   Wheelchair   Type: Manual Max wheelchair distance: 324 Assist Level: Supervision or verbal cues  Cognition Comprehension Comprehension assist level: Understands complex 90% of the time/cues 10% of the time  Expression Expression assist level: Expresses complex 90% of the time/cues < 10% of the time  Social Interaction Social Interaction assist level: Interacts appropriately with others with medication or extra time (anti-anxiety, antidepressant).  Problem Solving Problem solving assist level: Solves complex 90% of the time/cues < 10% of the time  Memory Memory assist level: Recognizes or recalls 90% of the time/requires cueing < 10% of the time    Medical Problem List and Plan: 1.  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  DVT Prophylaxis/Anticoagulation: SCDs.   Vascular study neg for DVT on 1/11 3. Pain Management: Hydrocodone as needed  Robaxin scheduled 1/11, increased  1/12  Gabapentin 100 TID started 1/12, increased to 300 on 1/14  Kpad added 1/12  Lidoderm patch added 1/14 4. Acute blood loss anemia. Continue iron supplement.   Hb 10.0 on 1/11  Cont to monitor  Labs ordered for Monday 5. Neuropsych: This patient is capable of making decisions on his own behalf. 6. Skin/Wound Care: Bilateral buttocks and sacral ulcer. Follow-up WOC 7. Fluids/Electrolytes/Nutrition: Routine I&O 8. Hypertension.   Verapamil 240 mg daily, increased to 360 on 1/12, reduced back to 240 on 1/14.   Improved, may need to reduce dose, will cont to monitor 9. CRI stage III.   Cr 1.54 on 1/11  Cont to monitor  Labs ordered for Monday 10. Urinary retention.   Flomax started 1/11 11. Hypokalemia.   K+ 3.9 on 1/11  Cont to monitor  Labs ordered for Monday 12. Constipation.   KUB 1/12 reviewed showing constipation  Improving, cont bowel reg 13. Hyperglycemia  Will cont to monitor 14. Hypoalbuminemia  Supplement initiated 1/11 15. Leukocytosis  Afebrile  WBcs 14.8 on 1/11  UA+, Ucx NG  Empiric Keflex d/ced  Labs ordered for Monday 16. Sleep disturbance  Trazodone PRN ordered  Improving  LOS (Days) 4 A FACE TO FACE EVALUATION WAS PERFORMED  Lakethia Coppess [name] 01/17/2016 7:16 AM

## 2016-01-17 NOTE — Progress Notes (Signed)
Patient unable to tell when he has had bowel movement. Bowels are liquid and frequent. Frequent dressing changes needed to keep wounds on buttocks clean. Reported to MD. Suggested to give suppository to facilitate emptying, then one does of immodium if needed. Patient agreeable with suppository. Will continue to monitor

## 2016-01-17 NOTE — Progress Notes (Signed)
Occupational Therapy Session Note  Patient Details  Name: Marvin Morrison MRN: [id number] Date of Birth: 13-Aug-1940  Today's Date: 01/17/2016 OT Individual Time: 1000-1055 OT Individual Time Calculation (min): 55 min    Short Term Goals: Week 1:  OT Short Term Goal 1 (Week 1): Pt will complete LB dressing using ADL AE with Mod A OT Short Term Goal 2 (Week 1): Pt will complete toilet transfers with Mod A and RW OT Short Term Goal 3 (Week 1): Pt will complete sit<>stand with Mod A in preparation for ADL activities. Week 2:     Skilled Therapeutic Interventions/Progress Updates:   Pt was sitting in w/c at time of arrival, initially refusing tx due to fatigue and persistent incontinence. RN-OT collaboration completed, deciding, if pt was willing, to assist pt with completely emptying bowels with use of suppository once transferred to [facility]. Pt in agreement and willing to participate. Tx focus on activity tolerance, standing endurance, and functional transfers. Pt completed stand pivot transfer from w/c to [facility] with RW,  Mod A and 1 extra helper standby assist for safety. Pt required extra time for voiding but requested for OT to stay nearby and talk to him. Discussed OT POC/goals, as well as provided therapeutic listening while pt recounted emotional challenges regarding his incontinence. Pt was ultimately minimally successful voiding, 2 helpers required for hygiene with pt cued for upright posture to maintain precautions. Pt required multiple rest breaks due to fatigue. Longest standing time without rest 2 minutes. Afterwards he was transferred back to bed in manner as written above and was repositioned for comfort, all needs within reach at time of departure.   Therapy Documentation Precautions:  Precautions Precautions: Fall, Back Precaution Booklet Issued: No Restrictions Weight Bearing Restrictions: No Other Position/Activity Restrictions: Back precautions  Pain: Pain Assessment Pain Score: 5   ADL: ADL ADL Comments: Please see functional navigator    See Function Navigator for Current Functional Status.   Therapy/Group: Individual Therapy  Del Overfelt A Naithan Delage 01/17/2016, 12:52 PM

## 2016-01-18 ENCOUNTER — Inpatient Hospital Stay (HOSPITAL_COMMUNITY): Payer: Medicare Other | Admitting: Physical Therapy

## 2016-01-18 ENCOUNTER — Inpatient Hospital Stay (HOSPITAL_COMMUNITY): Payer: Medicare Other

## 2016-01-18 ENCOUNTER — Inpatient Hospital Stay (HOSPITAL_COMMUNITY): Payer: Medicare Other | Admitting: Occupational Therapy

## 2016-01-18 LAB — CBC WITH DIFFERENTIAL/PLATELET
BASOS PCT: 1 %
Basophils Absolute: 0.1 10*3/uL (ref 0.0–0.1)
EOS ABS: 1.2 10*3/uL — AB (ref 0.0–0.7)
Eosinophils Relative: 13 %
HCT: 28.4 % — ABNORMAL LOW (ref 39.0–52.0)
Hemoglobin: 9.2 g/dL — ABNORMAL LOW (ref 13.0–17.0)
Lymphocytes Relative: 14 %
Lymphs Abs: 1.3 10*3/uL (ref 0.7–4.0)
MCH: 31.8 pg (ref 26.0–34.0)
MCHC: 32.4 g/dL (ref 30.0–36.0)
MCV: 98.3 fL (ref 78.0–100.0)
MONO ABS: 0.6 10*3/uL (ref 0.1–1.0)
MONOS PCT: 6 %
Neutro Abs: 6.2 10*3/uL (ref 1.7–7.7)
Neutrophils Relative %: 66 %
Platelets: 263 10*3/uL (ref 150–400)
RBC: 2.89 MIL/uL — ABNORMAL LOW (ref 4.22–5.81)
RDW: 15.2 % (ref 11.5–15.5)
WBC: 9.4 10*3/uL (ref 4.0–10.5)

## 2016-01-18 LAB — BASIC METABOLIC PANEL
Anion gap: 12 (ref 5–15)
BUN: 33 mg/dL — AB (ref 6–20)
CALCIUM: 8.5 mg/dL — AB (ref 8.9–10.3)
CO2: 19 mmol/L — AB (ref 22–32)
CREATININE: 1.75 mg/dL — AB (ref 0.61–1.24)
Chloride: 106 mmol/L (ref 101–111)
GFR calc Af Amer: 42 mL/min — ABNORMAL LOW (ref >60)
GFR calc non Af Amer: 36 mL/min — ABNORMAL LOW (ref >60)
Glucose, Bld: 122 mg/dL — ABNORMAL HIGH (ref 65–99)
Potassium: 4.2 mmol/L (ref 3.5–5.1)
Sodium: 137 mmol/L (ref 135–145)

## 2016-01-18 MED ORDER — SENNA 8.6 MG PO TABS
1.0000 | ORAL_TABLET | Freq: Every day | ORAL | Status: DC
  Administered 2016-01-19 – 2016-01-20 (×2): 8.6 mg via ORAL
  Filled 2016-01-18 (×2): qty 1

## 2016-01-18 NOTE — Progress Notes (Addendum)
[place] PHYSICAL MEDICINE & REHABILITATION     PROGRESS NOTE  Subjective/Complaints:  Pt seen laying in bed this AM.  He slept well overnight.  He notes "tremendous" improvement with lidoderm patch, but states it starts hurting the moment it is removed.    ROS: Denies CP, SOB, N/V/D.  Objective: Vital Signs: Blood pressure (!) 100/53, pulse 78, temperature 98 F (36.7 C), temperature source Oral, resp. rate 18, height 6' (1.829 m), weight 80.5 kg (177 lb 7.5 oz), SpO2 96 %. No results found. No results for input(s): WBC, HGB, HCT, PLT in the last 72 hours. No results for input(s): NA, K, CL, GLUCOSE, BUN, CREATININE, CALCIUM in the last 72 hours.  Invalid input(s): CO CBG (last 3)  No results for input(s): GLUCAP in the last 72 hours.  Wt Readings from Last 3 Encounters:  01/13/16 80.5 kg (177 lb 7.5 oz)  01/11/16 75.7 kg (166 lb 14.2 oz)  12/08/15 76.2 kg (168 lb)    Physical Exam:  BP (!) 100/53 (BP Location: Left Arm)   Pulse 78   Temp 98 F (36.7 C) (Oral)   Resp 18   Ht 6' (1.829 m)   Wt 80.5 kg (177 lb 7.5 oz)   SpO2 96%   BMI 24.07 kg/m  Gen: NAD. Well-developed.  HENT: Normocephalic. Atraumatic. Eyes: EOM are normal. No discharge. Cardiovascular: RRR.  No JVD. Respiratory: Effort normal and breath sounds normal.  GI: Soft, BS+ Musculoskeletal: He exhibits no edema, no tenderness.  Neurological: He is alert.  B/l UE strength 4+/5 B/l LE: 4/5 HF,KE  LLE ADF 4-/5 RLE ADF 4/5 Skin:  Stage II ulcer left and right buttocks - not examined today.  Scattered abrasions. Psychiatric: He has a normal mood and affect. His behavior is normal.   Assessment/Plan: 1. 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 Physiatrist is providing close team  supervision and 24 hour management of active medical problems listed below. Physiatrist and rehab team continue to assess barriers to discharge/monitor patient progress toward functional and medical goals.  Function:  Bathing Bathing position Bathing activity did not occur: N/A Position: Wheelchair/chair at sink  Bathing parts Body parts bathed by patient: Right arm, Left arm, Chest, Abdomen Body parts bathed by helper: Front perineal area, Buttocks, Right upper leg, Left upper leg, Right lower leg, Left lower leg  Bathing assist Assist Level:  (total A)      Upper Body Dressing/Undressing Upper body dressing Upper body dressing/undressing activity did not occur: Refused What is the patient wearing?: Pull over shirt/dress     Pull over shirt/dress - Perfomed by patient: Thread/unthread right sleeve, Thread/unthread left sleeve Pull over shirt/dress - Perfomed by helper: Put head through opening, Pull shirt over trunk        Upper body assist Assist Level: Touching or steadying assistance(Pt > 75%)      Lower Body Dressing/Undressing Lower body dressing Lower body dressing/undressing activity did not occur: N/A What is the patient wearing?: Socks, Pants       Pants- Performed by helper: Thread/unthread right pants leg, Thread/unthread left pants leg, Pull pants up/down   Non-skid slipper socks- Performed by helper: Don/doff left sock, Don/doff right sock   Socks - Performed by helper: Don/doff left sock, Don/doff right sock           TED Hose - Performed by helper:  Don/doff right TED hose, Don/doff left TED hose  Lower body assist Assist for lower body dressing:  (Total A)      Toileting Toileting Toileting activity did not occur: No continent bowel/bladder event   Toileting steps completed by helper: Adjust clothing prior to toileting, Performs perineal hygiene, Adjust clothing after toileting Toileting Assistive Devices: Grab bar or rail  Toileting assist Assist  level: Two helpers   Transfers Chair/bed transfer Chair/bed transfer activity did not occur: N/A Chair/bed transfer method: Stand pivot Chair/bed transfer assist level: Moderate assist (Pt 50 - 74%/lift or lower) Chair/bed transfer assistive device: Walker, Air cabin crew     Max distance: 38 ft Assist level: Moderate assist (Pt 50 - 74%)   Wheelchair   Type: Manual Max wheelchair distance: 150 ft Assist Level: Supervision or verbal cues  Cognition Comprehension Comprehension assist level: Understands complex 90% of the time/cues 10% of the time  Expression Expression assist level: Expresses complex 90% of the time/cues < 10% of the time  Social Interaction Social Interaction assist level: Interacts appropriately with others with medication or extra time (anti-anxiety, antidepressant).  Problem Solving Problem solving assist level: Solves complex 90% of the time/cues < 10% of the time  Memory Memory assist level: Recognizes or recalls 90% of the time/requires cueing < 10% of the time    Medical Problem List and Plan: 1.  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  DVT Prophylaxis/Anticoagulation: SCDs.   Vascular study neg for DVT on 1/11 3. Pain Management: Hydrocodone as needed  Robaxin scheduled 1/11, increased 1/12  Gabapentin 100 TID started 1/12, increased to 300 on 1/14  Kpad added 1/12  Lidoderm patch added 1/14 4. Acute blood loss anemia. Continue iron supplement.   Hb 10.0 on 1/11  Cont to monitor  Labs pending 5. Neuropsych: This patient is capable of making decisions on his own behalf. 6. Skin/Wound Care: Bilateral buttocks and sacral ulcer. Follow-up WOC 7. Fluids/Electrolytes/Nutrition: Routine I&O 8. Hypertension.   Verapamil 240 mg daily, increased to 360 on 1/12, reduced back to 240 on 1/14.    Improved 9. CRI stage III.   Cr 1.54 on 1/11  Cont to monitor  Labs pending 10. Urinary retention.   Flomax started 1/11 11. Hypokalemia.   K+ 3.9 on 1/11  Cont to monitor  Labs pending 12. Constipation.   KUB 1/12 reviewed showing constipation  Improving, senna decreased to qhs 13. Hyperglycemia  Will cont to monitor 14. Hypoalbuminemia  Supplement initiated 1/11 15. Leukocytosis  Afebrile  WBcs 14.8 on 1/11  UA+, Ucx NG  Empiric Keflex d/ced  Labs pending 16. Sleep disturbance  Trazodone PRN ordered  Improving  LOS (Days) 5 A FACE TO FACE EVALUATION WAS PERFORMED  Zakyah Yanes [name] 01/18/2016 8:13 AM

## 2016-01-18 NOTE — Progress Notes (Signed)
Physical Therapy Session Note  Patient Details  Name: Marvin Morrison MRN: [id number] Date of Birth: 1940/10/23  Today's Date: 01/18/2016 PT Individual Time: 1305-1400 PT Individual Time Calculation (min): 55 min    Short Term Goals: Week 1:  PT Short Term Goal 1 (Week 1): Patient will transfer bed <> wheelchair with supervision. PT Short Term Goal 2 (Week 1): Patient will ambulate 150 ft using RW with supervision. PT Short Term Goal 3 (Week 1): Patient will negotiate up/down 4 stairs with 1 rail and mod A.  PT Short Term Goal 4 (Week 1): Patient will perform bed mobility with supervision.   Skilled Therapeutic Interventions/Progress Updates:     Pt was in bed upon arrival and had an episode of bowel incontinence in addition to condom catheter coming loose. Supine-to-sit to EOB was supervision to min assist with HOB up and use of bedrails. Sit-to-stand within the stedy lift throughout toileting required min- mod assist with use of stedy handrail. Pt performed several episodes of standing tolerance lasting 2-3 minutes within the stedy in order for therapy and nursing to clean and then reapply new diaper and condom catheter.   Pt self-propelled in W/C with BUE 162' to rehab gym with supervision and occasional verbal cues for sequence.   Pt ascended forward and descended backwards on four 3 inch steps with BUE support on stair rails and +2 assist from therapist. Max manual and verbal cues for hand/foot placement, posture, and safety.  Pt self-propelled back to room in W/C (162'). Pt remained in W/C after therapy with all needs within reach.   Therapy Documentation Precautions:  Precautions Precautions: Fall, Back Precaution Booklet Issued: No Restrictions Weight Bearing Restrictions: No Other Position/Activity Restrictions: Back precautions General:    See Function Navigator for Current Functional Status.   Therapy/Group: Individual Therapy  Rosendo Gros 01/18/2016, 3:45 PM

## 2016-01-18 NOTE — Progress Notes (Signed)
Physical Therapy Session Note  Patient Details  Name: Marvin Morrison MRN: [id number] Date of Birth: 1940/04/05  Today's Date: 01/18/2016 PT Individual Time: 0800-0855 PT Individual Time Calculation (min): 55 min    Short Term Goals: Week 1:  PT Short Term Goal 1 (Week 1): Patient will transfer bed <> wheelchair with supervision. PT Short Term Goal 2 (Week 1): Patient will ambulate 150 ft using RW with supervision. PT Short Term Goal 3 (Week 1): Patient will negotiate up/down 4 stairs with 1 rail and mod A.  PT Short Term Goal 4 (Week 1): Patient will perform bed mobility with supervision.   Skilled Therapeutic Interventions/Progress Updates:  Pt was found in bed eating breakfast with all needs within reach. Spine <-> sitting at EOB required supervision/verbal cues with rolling to R and min assist with sidelying to sitting with HOB elevated and bedrails to eat. Pt sat at EOB for 10 min to finish breakfast while working on sitting balance and activity tolerance. Stand pivot transfer to W/C was +2 assist to safely transfer pt.   Therapy consisted of toileting due to pt unaware of bowel incontinence. Pt performed several sit<-> stand transfers with the stedy device over the commode and assistance increased from CGA to mod as pt fatigued. Pt demonstrated standing tolerance of 1-3 minutes with each stedy sit<-> stand that decreased with fatigue. Pt was cleaned, dressed, and positioned back into W/C with stedy device.  Pt was left in W/C in the care of nursing staff present in the room.   Timed toileting was discussed with nursing today due to frequent bowel incontinence and the stedy was cleared to be used with this patient with toileting.      Therapy Documentation Precautions:  Precautions Precautions: Fall, Back Precaution Booklet Issued: No Restrictions Weight Bearing Restrictions: No Other Position/Activity Restrictions: Back precautions General:   Vital Signs:  Pain: Pain  Assessment Pain Assessment: 0-10 Pain Score: 5  Pain Type: Acute pain Pain Location: Leg Pain Orientation: Right Pain Descriptors / Indicators: Aching Pain Onset: On-going Pain Intervention(s): Medication (See eMAR);Heat applied Multiple Pain Sites: No   See Function Navigator for Current Functional Status.   Therapy/Group: Individual Therapy  Rosendo Gros 01/18/2016, 8:06 AM

## 2016-01-18 NOTE — Progress Notes (Signed)
Occupational Therapy Session Note  Patient Details  Name: Marvin Morrison MRN: [id number] Date of Birth: 05/23/1940  Today's Date: 01/18/2016 OT Individual Time: 0902-1000 OT Individual Time Calculation (min): 58 min   Short Term Goals: Week 1:  OT Short Term Goal 1 (Week 1): Pt will complete LB dressing using ADL AE with Mod A OT Short Term Goal 2 (Week 1): Pt will complete toilet transfers with Mod A and RW OT Short Term Goal 3 (Week 1): Pt will complete sit<>stand with Mod A in preparation for ADL activities.  Skilled Therapeutic Interventions/Progress Updates:    OT session focused on modified bathing/dressing, sit<>stand, lower limb swelling, and activity tolerance. Reviewed back precautions, then pt completed UB bathing at the sink. Pt donned shirt today with assistance only to pull down over back. Educated on use of sock-aid and reacher for donning/doffing socks. Given pressure wounds on heels, but unable to safely don socks with sock-aid for skin integrity. Instructed pt on use of reacher to thread LE's into pants. Pt required Mod A to utilize AE appropriately. Mod A sit<>stand and Max A to maintain standing balance while OT assisted with pulling pants over hips. OT instructed on LB there-ex for swelling of feet and applied TED hose. Pt returned to bed with Max A stand-pivot and left with needs met.   Therapy Documentation Precautions:  Precautions Precautions: Fall, Back Precaution Booklet Issued: No Restrictions Weight Bearing Restrictions: No Other Position/Activity Restrictions: Back precautions Pain: Pain Assessment Pain Assessment: 0-10 Pain Score: 8  Pain Type: Acute pain Pain Location: Leg Pain Orientation: Right Pain Descriptors / Indicators: Aching Pain Frequency: Intermittent Pain Onset: On-going Patients Stated Pain Goal: 4 Pain Intervention(s): Repositioned Multiple Pain Sites: No ADL: ADL ADL Comments: Please see functional navigator  See Function  Navigator for Current Functional Status.   Therapy/Group: Individual Therapy  Valma Cava 01/18/2016, 12:51 PM

## 2016-01-18 NOTE — Progress Notes (Addendum)
Physical Therapy Note  Patient Details  Name: DESIRE PEDDER MRN: [id number] Date of Birth: October 11, 1940 Today's Date: 01/18/2016  1440-1510, 30 min individual tx Pain: none at rest  Seated neuromuscular re-education via multimodal cues and demo for 10 x 1 each R/L hip flexion, toes up/down, R/L long arc quad knee ext, bil hip adduction against towel roll for resistance with 5 second hold. instucted pt in diaphragmatic breathing.  Pt exhausted; NT informed pt would like to return to bed. Pt scooted forward/backward in w/c.  Pt left resting in w/c with all needs within reach.  See function navigator for current status.  Mitchelle Sultan 01/18/2016, 2:53 PM

## 2016-01-19 ENCOUNTER — Inpatient Hospital Stay (HOSPITAL_COMMUNITY): Payer: Medicare Other | Admitting: Occupational Therapy

## 2016-01-19 ENCOUNTER — Inpatient Hospital Stay (HOSPITAL_COMMUNITY): Payer: Medicare Other | Admitting: Physical Therapy

## 2016-01-19 DIAGNOSIS — N179 Acute kidney failure, unspecified: Secondary | ICD-10-CM

## 2016-01-19 MED ORDER — GABAPENTIN 300 MG PO CAPS
600.0000 mg | ORAL_CAPSULE | Freq: Three times a day (TID) | ORAL | Status: DC
  Administered 2016-01-19 (×2): 600 mg via ORAL
  Filled 2016-01-19 (×2): qty 2

## 2016-01-19 NOTE — Progress Notes (Signed)
[facility] PHYSICAL MEDICINE & REHABILITATION     PROGRESS NOTE  Subjective/Complaints:  Pt seen laying in bed this AM.  He slept well overnight, but complains of pain in his feet today as well as swelling.  He states that he did not receive his pain patch on time (8 minutes past due).  ROS: Denies CP, SOB, N/V/D.  Objective: Vital Signs: Blood pressure (!) 100/57, pulse 88, temperature 97.8 F (36.6 C), temperature source Oral, resp. rate 18, height 6' (1.829 m), weight 80.5 kg (177 lb 7.5 oz), SpO2 95 %. No results found.  Recent Labs  01/18/16 0855  WBC 9.4  HGB 9.2*  HCT 28.4*  PLT 263    Recent Labs  01/18/16 0855  NA 137  K 4.2  CL 106  GLUCOSE 122*  BUN 33*  CREATININE 1.75*  CALCIUM 8.5*   CBG (last 3)  No results for input(s): GLUCAP in the last 72 hours.  Wt Readings from Last 3 Encounters:  01/13/16 80.5 kg (177 lb 7.5 oz)  01/11/16 75.7 kg (166 lb 14.2 oz)  12/08/15 76.2 kg (168 lb)    Physical Exam:  BP (!) 100/57 (BP Location: Left Arm)   Pulse 88   Temp 97.8 F (36.6 C) (Oral)   Resp 18   Ht 6' (1.829 m)   Wt 80.5 kg (177 lb 7.5 oz)   SpO2 95%   BMI 24.07 kg/m  Gen: NAD. Well-developed.  HENT: Normocephalic. Atraumatic. Eyes: EOM are normal. No discharge. Cardiovascular: RRR.  No JVD. Respiratory: Effort normal and breath sounds normal.  GI: Soft, BS+ Musculoskeletal: He exhibits no edema, no tenderness.  Neurological: He is alert.  B/l UE strength 4+/5 B/l LE: 4/5 HF,KE  LLE ADF 4-/5 (unchanged) RLE ADF 4/5 Skin:  Stage II ulcer left and right buttocks - not examined today.  Scattered abrasions. Psychiatric: He has a normal mood and affect. His behavior is normal.   Assessment/Plan: 1. 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 Physiatrist is providing close team supervision and 24 hour management of active medical problems listed below. Physiatrist and rehab team continue to assess barriers to discharge/monitor patient progress toward functional and medical goals.  Function:  Bathing Bathing position Bathing activity did not occur: N/A Position: Wheelchair/chair at sink  Bathing parts Body parts bathed by patient: Right arm, Left arm, Chest, Abdomen Body parts bathed by helper: Front perineal area, Buttocks, Right upper leg, Left upper leg, Right lower leg, Left lower leg  Bathing assist Assist Level:  (total A for throughness)      Upper Body Dressing/Undressing Upper body dressing Upper body dressing/undressing activity did not occur: Refused What is the patient wearing?: Pull over shirt/dress     Pull over shirt/dress - Perfomed by patient: Thread/unthread right sleeve, Thread/unthread left sleeve, Put head through opening Pull over shirt/dress - Perfomed by helper: Pull shirt over trunk        Upper body assist Assist Level:  (moderate assist, 50%)      Lower Body Dressing/Undressing Lower body dressing Lower body dressing/undressing activity did not occur: N/A What is the patient wearing?: Non-skid slipper socks, Ted Hose, Pants       Pants- Performed by helper: Thread/unthread right pants leg, Pull pants up/down, Thread/unthread left pants leg   Non-skid slipper socks- Performed by helper: Don/doff right sock, Don/doff left  sock   Socks - Performed by helper: Don/doff left sock, Don/doff right sock           TED Hose - Performed by helper: Don/doff left TED hose, Don/doff right TED hose  Lower body assist Assist for lower body dressing:  (Max A)      Toileting Toileting Toileting activity did not occur: No continent bowel/bladder event   Toileting steps completed by helper: Adjust clothing prior to toileting, Performs perineal hygiene, Adjust clothing after  toileting Toileting Assistive Devices: Other (comment) (stedi)  Toileting assist Assist level: Two helpers   Transfers Chair/bed transfer   Chair/bed transfer method: Stand pivot Chair/bed transfer assist level: Moderate assist (Pt 50 - 74%/lift or lower) Chair/bed transfer assistive device: Walker, Air cabin crew     Max distance: 38 ft Assist level: Moderate assist (Pt 50 - 74%)   Wheelchair   Type: Manual Max wheelchair distance: 162 ft Assist Level: Supervision or verbal cues  Cognition Comprehension Comprehension assist level: Understands complex 90% of the time/cues 10% of the time  Expression Expression assist level: Expresses complex 90% of the time/cues < 10% of the time  Social Interaction Social Interaction assist level: Interacts appropriately with others with medication or extra time (anti-anxiety, antidepressant).  Problem Solving Problem solving assist level: Solves complex 90% of the time/cues < 10% of the time  Memory Memory assist level: Recognizes or recalls 90% of the time/requires cueing < 10% of the time    Medical Problem List and Plan: 1.  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  DVT Prophylaxis/Anticoagulation: SCDs.   Vascular study neg for DVT on 1/11 3. Pain Management: Hydrocodone as needed  Robaxin scheduled 1/11, increased 1/12  Gabapentin 100 TID started 1/12, increased to 300 on 1/14, increased to 600 on 1/16  Kpad added 1/12  Lidoderm patch added 1/14 4. Acute blood loss anemia. Continue iron supplement.   Hb 9.2 on 1/15  Cont to monitor 5. Neuropsych: This patient is capable of making decisions on his own behalf. 6. Skin/Wound Care: Bilateral buttocks and sacral ulcer. Follow-up WOC 7. Fluids/Electrolytes/Nutrition: Routine I&O 8. Hypertension.   Verapamil 240 mg daily, increased to  360 on 1/12, reduced back to 240 on 1/14.   Improving, may need to decrease 9. CRI stage III.   With AKI, Cr 1.75 on 1/15  Cont to monitor  Encourage fluids 10. Urinary retention.   Flomax started 1/11 11. Hypokalemia: Resolved   K+ 4.2 on 1/15  Cont to monitor 12. Constipation.   KUB 1/12 reviewed showing constipation  Improving, senna decreased to qhs 13. Hyperglycemia  Will cont to monitor 14. Hypoalbuminemia  Supplement initiated 1/11 15. Leukocytosis: Resolved  Afebrile  WBcs 9.4 on 1/15  UA+, Ucx NG  Empiric Keflex d/ced 16. Sleep disturbance  Trazodone PRN ordered  Improving  LOS (Days) 6 A FACE TO FACE EVALUATION WAS PERFORMED  Marvin Morrison [name] 01/19/2016 8:25 AM

## 2016-01-19 NOTE — Plan of Care (Signed)
Problem: RH SKIN INTEGRITY Goal: RH STG SKIN FREE OF INFECTION/BREAKDOWN Skin to remain free from additional breakdown and infection while on rehab with mod assist  Outcome: Progressing No additional breakdown noted.

## 2016-01-19 NOTE — Progress Notes (Signed)
Physical Therapy Session Note  Patient Details  Name: Marvin Morrison MRN: [id number] Date of Birth: Sep 04, 1940  Today's Date: 01/19/2016 PT Individual Time: [id number] PT Individual Time Calculation (min): 75 min    Short Term Goals: Week 1:  PT Short Term Goal 1 (Week 1): Patient will transfer bed <> wheelchair with supervision. PT Short Term Goal 2 (Week 1): Patient will ambulate 150 ft using RW with supervision. PT Short Term Goal 3 (Week 1): Patient will negotiate up/down 4 stairs with 1 rail and mod A.  PT Short Term Goal 4 (Week 1): Patient will perform bed mobility with supervision.       Skilled Therapeutic Interventions/Progress Updates:     Pt was found in bed. Rolling L and R for toileting and changing briefs required min A with use of bedrails and verbal cues for handplacement and sequence. Supine-to-sitting at EOB was min A with verbal cues for sequence and handplacement, HOB elevated, and using handrails. Sit-to-stand transfers throughout therapy required mod-max assist with verbal cues for handplacement, footplacement, and sequencing of the transfer. Stand pivot with the walker and squat pivot transfers throughout therapy required max-+2 assist due to poor sequencing and posterior lost of balance when turning.  Pt self-propelled in W/C 162 ft with supervision. Pt performed 2 bouts of ambulation of 15 and 20 ft with walker mod assist with verbal cues for posture and sequence with the walker.  Pt transferred in and out of nustep for NMR with a squat pivot transfer and required mod assist to set up BLE. Pt went for 5 minutes at level 3. Pt was rolled back to room and left in W/C with all needs within reach.  Pt expressed unrated shooting pain in BLE with ambulation during treatment. RN was made aware of pt's pain after therapy.   Therapy Documentation Precautions:  Precautions Precautions: Fall, Back Precaution Booklet Issued: No Restrictions Weight Bearing Restrictions:  No Other Position/Activity Restrictions: Back precautions General:   Vital Signs: Therapy Vitals Temp: 97.6 F (36.4 C) Temp Source: Oral Pulse Rate: 72 Resp: 18 BP: (!) 101/57 Patient Position (if appropriate): Lying Oxygen Therapy SpO2: 96 % O2 Device: Not Delivered Pain: Pain Assessment Pain Assessment: 0-10 Pain Score: 8  Pain Type: Acute pain Pain Location: Leg Pain Orientation: Right Pain Descriptors / Indicators: Aching Pain Onset: On-going Pain Intervention(s): Repositioned;RN made aware   See Function Navigator for Current Functional Status.   Therapy/Group: Individual Therapy  Rosendo Gros 01/19/2016, 4:28 PM

## 2016-01-19 NOTE — Progress Notes (Signed)
Occupational Therapy Session Note  Patient Details  Name: Marvin Morrison MRN: [id number] Date of Birth: 1940/04/30  Today's Date: 01/19/2016 OT Individual Time: 0800-0900 OT Individual Time Calculation (min): 60 min     Short Term Goals: Week 1:  OT Short Term Goal 1 (Week 1): Pt will complete LB dressing using ADL AE with Mod A OT Short Term Goal 2 (Week 1): Pt will complete toilet transfers with Mod A and RW OT Short Term Goal 3 (Week 1): Pt will complete sit<>stand with Mod A in preparation for ADL activities.  Skilled Therapeutic Interventions/Progress Updates:    1:1 OT session focused on functional transfers, modified bathing/dressing, sit<>stands, and standing tolerance. Stand-pivot transfer >BSC with Max A +1 to BSC. Pt with + Bm, requiring Max A to stand and Max A to maintain standing balance for dependent toileting. Pt unable to safely remove unilateral UE from RW to assist with hygiene. Pt also required multiple rest breaks during toileting 2/2 fatigue. Max A stand-pivot transfer from BSC>w/c, then grooming tasks completed with set-up A at the sink. Total A for LB dressing w/o ADL Ae. Pt left seated in wc at end of session with needs met.   Therapy Documentation Precautions:  Precautions Precautions: Fall, Back Precaution Booklet Issued: No Restrictions Weight Bearing Restrictions: No Other Position/Activity Restrictions: Back precautions Pain: Pain Assessment Pain Assessment: 0-10 Pain Score: 8  Faces Pain Scale: Hurts a little bit Pain Type: Acute pain Pain Location: Leg Pain Orientation: Right Pain Descriptors / Indicators: Aching Pain Onset: On-going Pain Intervention(s): Medication (See eMAR) ADL: ADL ADL Comments: Please see functional navigator  See Function Navigator for Current Functional Status.   Therapy/Group: Individual Therapy  Valma Cava 01/19/2016, 8:09 AM

## 2016-01-19 NOTE — Consult Note (Addendum)
WOC follow-up: Attempted to re-assess bilat buttocks unstageable pressure injuries.  Pt declines assessment at this time, states he is in pain and does not want to turn.  "Come back later today."  Air mattress in place to decrease pressure and he has a gel cushion for pressure reduction when OOB to wheelchair.  According to the EMR, he has been having frequent diarrhea and it is difficult to keep his wounds from becoming soiled, related to the close proximity on bilat buttocks to the rectum; this will impair healing to the location.Will attempt to reassess later today as requested.   Followup at 1130:  Unstageable pressure injuries have decreased slightly in size; Left buttock 5X1.5cm, Right buttock 3.5X1.5cm.  Both remain with tightly adhered yellow slough and small amt yellow drainage, no odor.  Pt states they are less uncomfortable than previous week. Continue present plan of care with Santyl for enzymatic debridement. Discussed plan of care with patient and he verbalized understanding. Please re-consult if further assistance is needed.  Thank-you,  Julien Girt MSN, [place], [place], [place], [place]

## 2016-01-19 NOTE — Plan of Care (Signed)
Problem: RH BOWEL ELIMINATION Goal: RH STG MANAGE BOWEL WITH ASSISTANCE STG Manage Bowel with mod Assistance.   Outcome: Not Progressing Total A at this time d/t incontinence

## 2016-01-19 NOTE — Progress Notes (Signed)
Physical Therapy Session Note  Patient Details  Name: Marvin Morrison MRN: [id number] Date of Birth: Apr 05, 1940  Today's Date: 01/19/2016 PT Individual Time: 1000-1100 PT Individual Time Calculation (min): 60 min    Short Term Goals: Week 1:  PT Short Term Goal 1 (Week 1): Patient will transfer bed <> wheelchair with supervision. PT Short Term Goal 2 (Week 1): Patient will ambulate 150 ft using RW with supervision. PT Short Term Goal 3 (Week 1): Patient will negotiate up/down 4 stairs with 1 rail and mod A.  PT Short Term Goal 4 (Week 1): Patient will perform bed mobility with supervision.  Week 2:     Skilled Therapeutic Interventions/Progress Updates:    Pt was in W/C at the start of therapy. Standing required min assist and ambulated 10 ft with RW required mod assist from therapist to use the commode. Pt performed 4 sit-to-stand transfers with rails and 1-2 min holds in standing during toileting activity. Max verbal cues for sequence/handplacement and min lifting assist with both directions were used.   Pt attempted to ambulate additional 8 ft to return to W/C. Initial standing transfer and ambulation assist was mod, but pt required total assist from therapist due to pt's BLE buckling when attempting to reverse back against the W/C to sit.     Pt self propelled 162' to rehab gym with supervision.   Pt performed a stand pivot transfer with RW from W/C to standard chair that required min assist to stand from chair but became mod assist with the pivot and lowering down into the standard chair.   Pt performed 5 sit-to-stand transfers with increasing assist needed (min->mod) due to pt's fatigue and deconditioning.  Pt performed one more stand pivot that was initially mod assist that turned into total assist by therapist to get pt back into chair safely.   Pt was returned back to room and was willing to remain sitting up in W/C. Call bell and all needs were within reach.    Therapy  Documentation Precautions:  Precautions Precautions: Fall, Back Precaution Booklet Issued: No Restrictions Weight Bearing Restrictions: No Other Position/Activity Restrictions: Back precautions General:   See Function Navigator for Current Functional Status.   Therapy/Group: Individual Therapy  Rosendo Gros 01/19/2016, 11:29 AM

## 2016-01-19 NOTE — Plan of Care (Signed)
Problem: RH PAIN MANAGEMENT Goal: RH STG PAIN MANAGED AT OR BELOW PT'S PAIN GOAL <4 on a 0-10 pain scale  Outcome: Progressing Only requesting scheduled pain medication. Rates pain as 4

## 2016-01-19 NOTE — Plan of Care (Signed)
Problem: RH BLADDER ELIMINATION Goal: RH STG MANAGE BLADDER WITH ASSISTANCE STG Manage Bladder With Assistance-Mod A    Outcome: Not Progressing Pt requesting the use of condom cath at all times.

## 2016-01-20 ENCOUNTER — Inpatient Hospital Stay (HOSPITAL_COMMUNITY): Payer: Medicare Other | Admitting: Physical Therapy

## 2016-01-20 ENCOUNTER — Inpatient Hospital Stay (HOSPITAL_COMMUNITY): Payer: Medicare Other

## 2016-01-20 ENCOUNTER — Inpatient Hospital Stay (HOSPITAL_COMMUNITY): Payer: Medicare Other | Admitting: Occupational Therapy

## 2016-01-20 LAB — URINALYSIS, ROUTINE W REFLEX MICROSCOPIC
Bacteria, UA: NONE SEEN
Bilirubin Urine: NEGATIVE
Glucose, UA: NEGATIVE mg/dL
Ketones, ur: NEGATIVE mg/dL
Leukocytes, UA: NEGATIVE
Nitrite: NEGATIVE
PROTEIN: NEGATIVE mg/dL
Specific Gravity, Urine: 1.008 (ref 1.005–1.030)
pH: 5 (ref 5.0–8.0)

## 2016-01-20 MED ORDER — GABAPENTIN 400 MG PO CAPS
400.0000 mg | ORAL_CAPSULE | Freq: Three times a day (TID) | ORAL | Status: DC
  Administered 2016-01-20 – 2016-01-21 (×3): 400 mg via ORAL
  Filled 2016-01-20 (×4): qty 1

## 2016-01-20 NOTE — Progress Notes (Signed)
Occupational Therapy Weekly Progress Note  Patient Details  Name: Marvin Morrison MRN: [id number] Date of Birth: [birth year]  Beginning of progress report period: January 14, 2016 End of progress report period: January 20, 2016  Today's Date: 01/20/2016 OT Individual Time: 6195-0932 OT Individual Time Calculation (min): 60 min    Patient has met 1 of 3 short term goals.  Marvin Morrison is progressing towards his goals. He is able to complete stand-pivot transfer with Max-Mod A. He is inconsistently transferring with Mod A depending on his pain management. Pt is able to don pants with Mod/Max A  And is progressing with use of ADL Ae. Pt is very limited by pain and bowel incontinence.   Patient continues to demonstrate the following deficits: muscle weakness and decreased sitting balance, decreased standing balance, decreased postural control, decreased balance strategies, difficulty maintaining precautions and decreased activity tolerance.  and therefore will continue to benefit from skilled OT intervention to enhance overall performance with BADL.  Patient progressing toward long term goals..  Continue plan of care.  OT Short Term Goals Week 1:  OT Short Term Goal 1 (Week 1): Pt will complete LB dressing using ADL AE with Mod A OT Short Term Goal 1 - Progress (Week 1): Partly met OT Short Term Goal 2 (Week 1): Pt will complete toilet transfers with Mod A and RW OT Short Term Goal 3 (Week 1): Pt will complete sit<>stand with Mod A in preparation for ADL activities. OT Short Term Goal 3 - Progress (Week 1): Met Week 2:  OT Short Term Goal 1 (Week 2): Pt will complete LB dressing using ADL AE with Mod A OT Short Term Goal 2 (Week 2): Pt will complete toilet transfers with Mod A and RW OT Short Term Goal 3 (Week 2): Pt will tolerate 5 minutes of standing activities with Mod A for balance   Skilled Therapeutic Interventions/Progress Updates:    1:1 T session focused on pain management, LB ADL,  activity tolerance, and improved sit<>stand. Pt reporting 10/10 pain, 2 attempts to get OOB 2/2 pain. Stand-pivot transfer with overall Max A. Modified bathing/dressing at the sink using long-handled sponge and reacher. Pt incontinent of stool in standing, requiring total A for brief change and peri-care. Pt with increased UE tremors today, increasing difficulty with LB dressing tasks, requiring assist to thread pants on B LE's and Max A to maintain standing balance while OT assisted with pulling up pants. Pt left seated in wc at end of session with spouse present and needs met.   Therapy Documentation Precautions:  Precautions Precautions: Fall, Back Precaution Booklet Issued: No Restrictions Weight Bearing Restrictions: No Other Position/Activity Restrictions: Back precautions Pain: Pain Assessment Pain Assessment: 0-10 Pain Score: 10-Worst pain ever Pain Type: Acute pain Pain Location: Leg Pain Orientation: Right Pain Descriptors / Indicators: Aching Pain Onset: With Activity Pain Intervention(s): RN made aware;Repositioned (RN administered pain meds) ADL: ADL ADL Comments: Please see functional navigator  See Function Navigator for Current Functional Status.   Therapy/Group: Individual Therapy  Marvin Morrison 01/20/2016, 9:32 AM

## 2016-01-20 NOTE — Progress Notes (Addendum)
Social Work Patient ID: Marvin Morrison, male   DOB: 1940/04/26, 76 y.o.   MRN: [id number]  Met with pt to inform team conference goals-min assist level and discharge 2/1. He reports his wife can be there with him. Discussed how he has gotten worse while here and requires more assistance. He feels it is due to his pain issues. MD is working on this Pt voiced he will push himself also. He has not been doing this in therapies.

## 2016-01-20 NOTE — Patient Care Conference (Signed)
Inpatient RehabilitationTeam Conference and Plan of Care Update Date: 01/20/2016   Time: 2:00 PM    Patient Name: Marvin Morrison      Medical Record Number: [id number]  Date of Birth: [birth year] Sex: Male         Room/Bed: 4M06C/4M06C-01 Payor Info: Payor: MEDICARE / Plan: MEDICARE PART A AND B / Product Type: *No Product type* /    Admitting Diagnosis: Debility  Admit Date/Time:  01/13/2016  5:19 PM Admission Comments: No comment available   Primary Diagnosis:  <principal problem not specified> Principal Problem: <principal problem not specified>  Patient Active Problem List   Diagnosis Date Noted  . AKI (acute kidney injury) ([place])   . Constipation   . Sleep disturbance   . Labile blood pressure   . Neuropathic pain   . Benign essential HTN   . Dysuria   . Hypoalbuminemia due to protein-calorie malnutrition ([place])   . Lymphocytosis   . Hyperglycemia   . Radiculopathy 01/13/2016  . Surgery, elective   . Post-operative pain   . Acute blood loss anemia   . Stage 3 chronic kidney disease   . Urinary retention   . Hypokalemia   . Constipation due to pain medication   . Pressure injury of skin 01/12/2016  . Malnutrition of moderate degree 01/12/2016  . Lumbar stenosis 01/11/2016  . Gait disorder 11/18/2015  . Spinal stenosis of lumbar region 10/28/2015  . Nausea with vomiting 04/19/2014  . Loss of weight 05/18/2012  . Well adult exam 05/13/2011  . 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 CRI (chronic renal insufficiency) 07/13/2010  . Anemia due to other cause 04/06/2010  . Anemia, iron deficiency 04/06/2010  . DIARRHEA, CHRONIC 03/27/2009  . 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 Hyperlipidemia 02/01/2008  . ABNORMAL GLUCOSE NEC 02/01/2008  . Weakness 11/19/2007  . SHINGLES 11/17/2007  . Essential hypertension 11/09/2006    Expected Discharge Date: Expected Discharge Date: 02/04/16  Team Members Present: Physician leading conference: Dr. 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 Up to [facility] for BM   Swallow/Nutrition/ Hydration             ADL's   Mod/Max A overall  downgrade goals to Min A overall  pt/family education, functional transfers, modified b/d, ADL AE, back precautions    Mobility   min-max A overall  mod I except supervision car, min A stairs-plan to downgrade to min A overall at next weekly note  functional mobility training, coordination, standing balance, activity tolerance, strengthening, activity tolerance, safety awareness, pt education   Communication             Safety/Cognition/ Behavioral Observations            Pain   Faces pain scale 2-4/10, Stated pain scale 9/10. 400mg  Neurontin TID, Lidocaine patch q 24hr, Robaxin 750mg  QID, 1-2 Norco q hrs. PRN  <4 on a 0-10 pain scale  assess pain q 4 hours and prn   Skin  2 unstageables (1 to each buttock), Santyl applied daily, moist gauze daily and foam dressing prn. Bilateral heels cracking with foam dressing applied. Honeycomb dressing applied to back incision CDI.  Encourage patient to turn while in bed and boost when in chair. No new skin breakdown while on rehab  Assess skin q shift and prn      *See Care Plan and progress notes for long and short-term goals.  Barriers to Discharge: Mobility, safety, urinary retention/freq, AKI, pain    Possible Resolutions to Barriers:  Encourage fluids, therapies, optimize pain  and coping with pain    Discharge Planning/Teaching Needs:  Home with wife who can provide almost 24 hr care does work couple hours per week.      Team Discussion:  Pt's pain is limiting him in therapies-his goals have been downgraded to min assist level. MD working with him on his pain meds and what works the best for him. Checking UA. BP meds adjusting also. Tremoring today. Will ask wife to come in and attend therapies with pt to see how he is progressing.  Revisions to Treatment Plan:  Downgraded goals to min assist level   Continued Need for Acute Rehabilitation Level of Care: The patient requires daily medical management by a physician with specialized training in physical medicine and rehabilitation for the following conditions: Daily direction of a multidisciplinary physical rehabilitation program to ensure safe treatment while eliciting the highest outcome that is of practical value to the patient.: Yes Daily medical management of patient stability for increased activity during participation in an intensive rehabilitation regime.: Yes Daily analysis of laboratory values and/or radiology reports with any subsequent need for medication adjustment of medical intervention for : Post surgical problems;Neurological problems;Renal problems;Urological problems  [name] 01/20/2016, 2:45 PM

## 2016-01-20 NOTE — Progress Notes (Signed)
[place] PHYSICAL MEDICINE & REHABILITATION     PROGRESS NOTE  Subjective/Complaints:  Pt seen laying in bed eating breakfast.  He slept well overnight.  He continues to complain of about edema in his LE, but when informed and showed pt the lack of edema, he states that he sees there is no edema.  He continues to complain of pain medications being given on time.   ROS: +Pain. Denies CP, SOB, N/V/D.  Objective: Vital Signs: Blood pressure 122/65, pulse 80, temperature 97.6 F (36.4 C), temperature source Oral, resp. rate 18, height 6' (1.829 m), weight 80.5 kg (177 lb 7.5 oz), SpO2 97 %. No results found.  Recent Labs  01/18/16 0855  WBC 9.4  HGB 9.2*  HCT 28.4*  PLT 263    Recent Labs  01/18/16 0855  NA 137  K 4.2  CL 106  GLUCOSE 122*  BUN 33*  CREATININE 1.75*  CALCIUM 8.5*   CBG (last 3)  No results for input(s): GLUCAP in the last 72 hours.  Wt Readings from Last 3 Encounters:  01/13/16 80.5 kg (177 lb 7.5 oz)  01/11/16 75.7 kg (166 lb 14.2 oz)  12/08/15 76.2 kg (168 lb)    Physical Exam:  BP 122/65 (BP Location: Left Arm)   Pulse 80   Temp 97.6 F (36.4 C) (Oral)   Resp 18   Ht 6' (1.829 m)   Wt 80.5 kg (177 lb 7.5 oz)   SpO2 97%   BMI 24.07 kg/m  Gen: NAD. Well-developed.  HENT: Normocephalic. Atraumatic. Eyes: EOM are normal. No discharge. Cardiovascular: RRR.  No JVD. Respiratory: Effort normal and breath sounds normal.  GI: Soft, BS+ Musculoskeletal: He exhibits no edema, no tenderness.  Neurological: He is alert.  B/l UE strength 4+/5 B/l LE: 4/5 HF,KE  LLE ADF 4-/5 (stable) RLE ADF 4/5 Skin:  Stage II ulcer left and right buttocks - not examined today.  Scattered abrasions. Psychiatric: He has a normal mood and affect. His behavior is normal.   Assessment/Plan: 1. 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 Physiatrist is providing close team supervision and 24 hour management of active medical problems listed below. Physiatrist and rehab team continue to assess barriers to discharge/monitor patient progress toward functional and medical goals.  Function:  Bathing Bathing position Bathing activity did not occur: N/A Position: Wheelchair/chair at sink  Bathing parts Body parts bathed by patient: Right arm, Left arm, Chest, Abdomen Body parts bathed by helper: Front perineal area, Buttocks, Right upper leg, Left upper leg, Right lower leg, Left lower leg  Bathing assist Assist Level:  (total A for throughness)      Upper Body Dressing/Undressing Upper body dressing Upper body dressing/undressing activity did not occur: Refused What is the patient wearing?: Pull over shirt/dress     Pull over shirt/dress - Perfomed by patient: Thread/unthread right sleeve, Thread/unthread left sleeve Pull over shirt/dress - Perfomed by helper: Put head through opening, Pull shirt over trunk        Upper body assist Assist Level: Touching or steadying assistance(Pt > 75%)      Lower Body Dressing/Undressing Lower body dressing Lower body dressing/undressing activity did not occur: N/A What is the patient wearing?: Non-skid slipper socks, Pants       Pants- Performed by helper: Thread/unthread left pants leg, Thread/unthread right pants leg, Pull pants up/down  Non-skid slipper socks- Performed by helper: Don/doff right sock, Don/doff left sock   Socks - Performed by helper: Don/doff left sock, Don/doff right sock           TED Hose - Performed by helper: Don/doff left TED hose, Don/doff right TED hose  Lower body assist Assist for lower body dressing:  (Total A)      Toileting Toileting Toileting activity did not occur: No continent bowel/bladder event   Toileting steps completed by helper: Adjust  clothing prior to toileting, Performs perineal hygiene, Adjust clothing after toileting Toileting Assistive Devices: Grab bar or rail  Toileting assist Assist level: Two helpers   Transfers Chair/bed transfer   Chair/bed transfer method: Stand pivot Chair/bed transfer assist level: Maximal assist (Pt 25 - 49%/lift and lower) Chair/bed transfer assistive device: Armrests, Medical sales representative     Max distance: 15 ft Assist level: Moderate assist (Pt 50 - 74%)   Wheelchair   Type: Manual Max wheelchair distance: 162 ft Assist Level: Supervision or verbal cues  Cognition Comprehension Comprehension assist level: Understands basic 75 - 89% of the time/ requires cueing 10 - 24% of the time  Expression Expression assist level: Expresses complex 90% of the time/cues < 10% of the time  Social Interaction Social Interaction assist level: Interacts appropriately with others with medication or extra time (anti-anxiety, antidepressant).  Problem Solving Problem solving assist level: Solves complex 90% of the time/cues < 10% of the time  Memory Memory assist level: Recognizes or recalls 90% of the time/requires cueing < 10% of the time    Medical Problem List and Plan: 1.  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  DVT Prophylaxis/Anticoagulation: SCDs.   Vascular study neg for DVT on 1/11 3. Pain Management: Hydrocodone as needed  Robaxin scheduled 1/11, increased 1/12  Gabapentin 100 TID started 1/12, increased to 300 on 1/14, 400 on 1/17 due to CrCl  Kpad added 1/12  Lidoderm patch added 1/14  Improving. 4. Acute blood loss anemia. Continue iron supplement.   Hb 9.2 on 1/15  Cont to monitor 5. Neuropsych: This patient is capable of making decisions on his own behalf. 6. Skin/Wound Care: Bilateral buttocks and sacral ulcer. Follow-up  WOC 7. Fluids/Electrolytes/Nutrition: Routine I&O 8. Hypertension.   Verapamil 240 mg daily, increased to 360 on 1/12, reduced back to 240 on 1/14.   WNL on 1/17 9. CRI stage III.   With AKI, Cr 1.75 on 1/15  Cont to monitor  Encourage fluids 10. Urinary retention.   Flomax started 1/11 11. Hypokalemia: Resolved   K+ 4.2 on 1/15  Cont to monitor 12. Constipation.   KUB 1/12 reviewed showing constipation  Improving, senna decreased to qhs 13. Hyperglycemia  Will cont to monitor 14. Hypoalbuminemia  Supplement initiated 1/11 15. Leukocytosis: Resolved  Afebrile  WBcs 9.4 on 1/15  UA+, Ucx NG  Empiric Keflex d/ced 16. Sleep disturbance  Trazodone PRN ordered  Improving  LOS (Days) 7 A FACE TO FACE EVALUATION WAS PERFORMED  Katarina Riebe [name] 01/20/2016 7:41 AM

## 2016-01-20 NOTE — Progress Notes (Addendum)
Physical Therapy Session Note  Patient Details  Name: Marvin Morrison MRN: [id number] Date of Birth: [birth year]  Today's Date: 01/20/2016 PT Individual Time: 0900-1000 PT Individual Time Calculation (min): 60 min   Short Term Goals: Week 1:  PT Short Term Goal 1 (Week 1): Patient will transfer bed <> wheelchair with supervision. PT Short Term Goal 2 (Week 1): Patient will ambulate 150 ft using RW with supervision. PT Short Term Goal 3 (Week 1): Patient will negotiate up/down 4 stairs with 1 rail and mod A.  PT Short Term Goal 4 (Week 1): Patient will perform bed mobility with supervision.   Skilled Therapeutic Interventions/Progress Updates: pt with tremors x 4 extremities.  w/c propulsion using bil UEs to/from therapy with superviison, insruction for efficiency of propulsion for turns and straight path.  Stand pivot with Rw w/c> NuStep to r with max assist, with pt unable to fully extend bil knees.  NuStep for neuro re-ed at level 4 x 7 minutes; w/cpropulsion using bil LEs x 50' before limited by bil LE pain. Seated neuromuscular re-education via demo for bil toe/heel raises x 20 cycles and bil hip adduction against resistance x 10.   Squat pivot to L back to w/c.     Gait with Rw x 6' with bil knees flexed throughout, excessive reliance on bil UEs; limited by shooting pain sensation bil LEs. Pt left resting in w/c with all needs within reach and quick release belt donned.  PT requested that NT transfer him back to bed for rest before lunch. Pt with foul smelling urine; PT spoke with Erline Levine, RN about possibility of UTI contributing to recent decline in function and incoordination.  Therapy Documentation Precautions:  Precautions Precautions: Fall, Back Precaution Booklet Issued: No Restrictions Weight Bearing Restrictions: No Other Position/Activity Restrictions: Back precautions   Pain: Pain Assessment Pain Assessment: 0-10 Pain Score: 10-Worst pain ever Pain Type: Acute pain Pain  Location: Leg Pain Orientation: Right Pain Descriptors / Indicators: Aching Pain Onset: With Activity Pain Intervention(s): RN made aware;Repositioned (RN administered pain meds)     See Function Navigator for Current Functional Status.   Therapy/Group: Individual Therapy  Dane Bloch 01/20/2016, 10:13 AM

## 2016-01-20 NOTE — Progress Notes (Signed)
Social Work [name], LCSW Social Worker Signed   Patient Care Conference Date of Service: 01/20/2016  2:45 PM      Hide copied text Hover for attribution information Inpatient RehabilitationTeam Conference and Plan of Care Update Date: 01/20/2016   Time: 2:00 PM      Patient Name: Marvin Morrison      Medical Record Number: [id number]  Date of Birth: 1940-07-07 Sex: Male         Room/Bed: 4M06C/4M06C-01 Payor Info: Payor: MEDICARE / Plan: MEDICARE PART A AND B / Product Type: *No Product type* /     Admitting Diagnosis: Debility  Admit Date/Time:  01/13/2016  5:19 PM Admission Comments: No comment available    Primary Diagnosis:  <principal problem not specified> Principal Problem: <principal problem not specified>       Patient Active Problem List    Diagnosis Date Noted  . AKI (acute kidney injury) ([place])    . Constipation    . Sleep disturbance    . Labile blood pressure    . Neuropathic pain    . Benign essential HTN    . Dysuria    . Hypoalbuminemia due to protein-calorie malnutrition ([place])    . Lymphocytosis    . Hyperglycemia    . Radiculopathy 01/13/2016  . Surgery, elective    . Post-operative pain    . Acute blood loss anemia    . Stage 3 chronic kidney disease    . Urinary retention    . Hypokalemia    . Constipation due to pain medication    . Pressure injury of skin 01/12/2016  . Malnutrition of moderate degree 01/12/2016  . Lumbar stenosis 01/11/2016  . Gait disorder 11/18/2015  . Spinal stenosis of lumbar region 10/28/2015  . Nausea with vomiting 04/19/2014  . Loss of weight 05/18/2012  . Well adult exam 05/13/2011  . 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 CRI (chronic renal insufficiency) 07/13/2010  . Anemia due to other cause 04/06/2010  . Anemia, iron deficiency 04/06/2010  . DIARRHEA, CHRONIC 03/27/2009  . 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 Hyperlipidemia 02/01/2008  . ABNORMAL GLUCOSE NEC 02/01/2008  . Weakness 11/19/2007  .  SHINGLES 11/17/2007  . Essential hypertension 11/09/2006      Expected Discharge Date: Expected Discharge Date: 02/04/16   Team Members Present: Physician leading conference: Dr. 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 Up to [facility] for BM   Swallow/Nutrition/ Hydration             ADL's   Mod/Max A overall  downgrade goals to Min A overall  pt/family education, functional transfers, modified b/d, ADL AE, back precautions    Mobility   min-max A overall  mod I except supervision car, min A stairs-plan to downgrade to min A overall at next weekly note  functional mobility training, coordination, standing balance, activity tolerance, strengthening, activity tolerance, safety awareness, pt education   Communication  Safety/Cognition/ Behavioral Observations           Pain   Faces pain scale 2-4/10, Stated pain scale 9/10. 400mg  Neurontin TID, Lidocaine patch q 24hr, Robaxin 750mg  QID, 1-2 Norco q hrs. PRN  <4 on a 0-10 pain scale  assess pain q 4 hours and prn   Skin   2 unstageables (1 to each buttock), Santyl applied daily, moist gauze daily and foam dressing prn. Bilateral heels cracking with foam dressing applied. Honeycomb dressing applied to back incision CDI.  Encourage patient to turn while in bed and boost when in chair. No new skin breakdown while on rehab  Assess skin q shift and prn       *See Care Plan and  progress notes for long and short-term goals.   Barriers to Discharge: Mobility, safety, urinary retention/freq, AKI, pain   Possible Resolutions to Barriers:  Encourage fluids, therapies, optimize pain and coping with pain   Discharge Planning/Teaching Needs:  Home with wife who can provide almost 24 hr care does work couple hours per week.      Team Discussion:  Pt's pain is limiting him in therapies-his goals have been downgraded to min assist level. MD working with him on his pain meds and what works the best for him. Checking UA. BP meds adjusting also. Tremoring today. Will ask wife to come in and attend therapies with pt to see how he is progressing.  Revisions to Treatment Plan:  Downgraded goals to min assist level    Continued Need for Acute Rehabilitation Level of Care: The patient requires daily medical management by a physician with specialized training in physical medicine and rehabilitation for the following conditions: Daily direction of a multidisciplinary physical rehabilitation program to ensure safe treatment while eliciting the highest outcome that is of practical value to the patient.: Yes Daily medical management of patient stability for increased activity during participation in an intensive rehabilitation regime.: Yes Daily analysis of laboratory values and/or radiology reports with any subsequent need for medication adjustment of medical intervention for : Post surgical problems;Neurological problems;Renal problems;Urological problems   [name] 01/20/2016, 2:45 PM       Patient ID: Marvin Morrison, male   DOB: 1940-07-21, 76 y.o.   MRN: [id number]

## 2016-01-20 NOTE — Progress Notes (Signed)
Physical Therapy Session Note  Patient Details  Name: Marvin Morrison MRN: [id number] Date of Birth: 07/23/1940  Today's Date: 01/20/2016 PT Individual Time: [id number] PT Individual Time Calculation (min): 70 min    Short Term Goals: Week 1:  PT Short Term Goal 1 (Week 1): Patient will transfer bed <> wheelchair with supervision. PT Short Term Goal 2 (Week 1): Patient will ambulate 150 ft using RW with supervision. PT Short Term Goal 3 (Week 1): Patient will negotiate up/down 4 stairs with 1 rail and mod A.  PT Short Term Goal 4 (Week 1): Patient will perform bed mobility with supervision.   Skilled Therapeutic Interventions/Progress Updates:    Pt received in bed & agreeable to tx. Pt noted 0/10 pain at rest but 10/10 pain in posterior BLE with activity; RN reports pt has been premedicated. Pt found to be incontinent of bowel & bladder. Pt performed rolling L/R with steady assist/supervision and cuing for log rolling to maintain back precautions while RN performed peri hygiene total assist. Pt transferred supine>sidelying>sitting EOB with use of hospital bed features & supervision assist. Pt able to recall back precautions. Pt completed squat pivot bed>w/c with mod/max assist requiring multiple weight shifts to completely sit in w/c. Pt propelled w/c room<>gym with BUE & supervision assist for cardiopulmonary endurance training. Pt complete sit<>stand transfers with mod assist with verbal cuing for anterior weight shift, hand placement & sequencing, however pt unable to anterior weight shift and requires significant assistance to prevent posterior LOB. Gait training x 15 ft + 15 ft with RW & mod assist with pt exhibiting BLE knee flexion, forward trunk flexion, and ataxic gait. Pt requires cuing for forward gaze with poor demo. Pt noted to have a second incontinent bowel episode & tolerated standing x 3 minutes with RW & mod assist to allow NT to perform peri hygiene total assist. At end of session pt  left sitting in w/c in room with QRB donned & all needs within reach. Reviewed use of call bell with pt return demonstrating.  Therapy Documentation Precautions:  Precautions Precautions: Fall, Back Precaution Booklet Issued: No Restrictions Weight Bearing Restrictions: No Other Position/Activity Restrictions: Back precautions   See Function Navigator for Current Functional Status.   Therapy/Group: Individual Therapy  Waunita Schooner 01/20/2016, 5:39 PM

## 2016-01-21 ENCOUNTER — Inpatient Hospital Stay (HOSPITAL_COMMUNITY): Payer: Medicare Other

## 2016-01-21 ENCOUNTER — Inpatient Hospital Stay (HOSPITAL_COMMUNITY): Payer: Medicare Other | Admitting: Physical Therapy

## 2016-01-21 ENCOUNTER — Inpatient Hospital Stay (HOSPITAL_COMMUNITY): Payer: Medicare Other | Admitting: Occupational Therapy

## 2016-01-21 MED ORDER — PRO-STAT SUGAR FREE PO LIQD
30.0000 mL | Freq: Every day | ORAL | Status: DC
  Administered 2016-01-22 – 2016-02-06 (×16): 30 mL via ORAL
  Filled 2016-01-21 (×14): qty 30

## 2016-01-21 MED ORDER — ASPIRIN 81 MG PO CHEW
CHEWABLE_TABLET | ORAL | Status: AC
  Filled 2016-01-21: qty 1

## 2016-01-21 MED ORDER — BOOST PLUS PO LIQD
237.0000 mL | Freq: Every day | ORAL | Status: DC
  Administered 2016-01-22 – 2016-02-05 (×12): 237 mL via ORAL
  Filled 2016-01-21 (×24): qty 237

## 2016-01-21 MED ORDER — METHOCARBAMOL 750 MG PO TABS
750.0000 mg | ORAL_TABLET | Freq: Four times a day (QID) | ORAL | Status: DC | PRN
  Administered 2016-01-21 – 2016-02-06 (×33): 750 mg via ORAL
  Filled 2016-01-21 (×33): qty 1

## 2016-01-21 MED ORDER — GABAPENTIN 100 MG PO CAPS
100.0000 mg | ORAL_CAPSULE | Freq: Three times a day (TID) | ORAL | Status: DC
  Administered 2016-01-22 – 2016-02-06 (×45): 100 mg via ORAL
  Filled 2016-01-21 (×45): qty 1

## 2016-01-21 NOTE — Progress Notes (Signed)
Occupational Therapy Note  Patient Details  Name: FRANCO EDMUNDSON MRN: [id number] Date of Birth: 08/01/1940  Today's Date: 01/21/2016 OT Missed Time: 62 Minutes Missed Time Reason: Patient fatigue  Pt missed 30 min due to fatigue and discomfort. Pt continued to decline despite encouragement. Pt left in bed to eat lunch with call bell.    Willeen Cass [facility] 01/21/2016, 2:13 PM

## 2016-01-21 NOTE — Progress Notes (Signed)
Physical Therapy Note  Patient Details  Name: Marvin Morrison MRN: [id number] Date of Birth: [birth year] Today's Date: 01/21/2016  1505- 1610, 65 min individual tx Pain: 7/10 R lateral leg, premedicated  Supine neuromuscular re-education via demo and multimodal cues, 10 x 2 bil hip internal rotation, R/L heel slides, resisted bil plantar flexion, 10 x 2 L straight leg raises, r assisted straight leg raise.  Desensitization techniques bil feet with light> deep touch, with pt tolerating well. Supine > sit with tactile cues.  Pt sat EOB x 5 minutes.  Sit> stand with mod assist.  Gait over level tile with RW x 16', mod assist. Pt had bowel accident; Dolores Lory RN informed.  Stand pivot with use of bed rail with mod assist.  Pt left in care of RN.   See function navigator for current status.   Erikka Follmer 01/21/2016, 3:21 PM

## 2016-01-21 NOTE — Progress Notes (Signed)
Marvin Morrison PHYSICAL MEDICINE & REHABILITATION     PROGRESS NOTE  Subjective/Complaints:  Pt seen sitting up in his chair this AM working with therapies.  He slept well overnight.  He tells me again that his LE are not swollen.  He states his pain is under control.  Spoke to therapies who note poor functional improvement.   ROS: Denies CP, SOB, N/V/D.  Objective: Vital Signs: Blood pressure 121/68, pulse 76, temperature 97.8 F (36.6 C), temperature source Oral, resp. rate 18, height 6' (1.829 m), weight 81.5 kg (179 lb 10.8 oz), SpO2 98 %. No results found. No results for input(s): WBC, HGB, HCT, PLT in the last 72 hours. No results for input(s): NA, K, CL, GLUCOSE, BUN, CREATININE, CALCIUM in the last 72 hours.  Invalid input(s): CO CBG (last 3)  No results for input(s): GLUCAP in the last 72 hours.  Wt Readings from Last 3 Encounters:  01/20/16 81.5 kg (179 lb 10.8 oz)  01/11/16 75.7 kg (166 lb 14.2 oz)  12/08/15 76.2 kg (168 lb)    Physical Exam:  BP 121/68 (BP Location: Left Arm)   Pulse 76   Temp 97.8 F (36.6 C) (Oral)   Resp 18   Ht 6' (1.829 m)   Wt 81.5 kg (179 lb 10.8 oz)   SpO2 98%   BMI 24.37 kg/m  Gen: NAD. Well-developed.  HENT: Normocephalic. Atraumatic. Eyes: EOM are normal. No discharge. Cardiovascular: RRR.  No JVD. Respiratory: Effort normal and breath sounds normal.  GI: Soft, BS+ Musculoskeletal: He exhibits no edema, no tenderness.  Neurological: He is alert.  B/l UE strength 4+/5 B/l LE: 4/5 HF,KE  LLE ADF 4-/5 (unchanged) RLE ADF 4/5 Skin:  Stage II ulcer left and right buttocks - not examined today.  Scattered abrasions. Psychiatric: He has a normal mood and affect. His behavior is normal.   Assessment/Plan: 1. 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 Physiatrist is providing close team supervision and 24 hour management of active medical problems listed below. Physiatrist and rehab team continue to assess barriers to discharge/monitor patient progress toward functional and medical goals.  Function:  Bathing Bathing position Bathing activity did not occur: N/A Position: Wheelchair/chair at sink  Bathing parts Body parts bathed by patient: Right arm, Left arm, Chest, Abdomen, Front perineal area, Right upper leg, Left upper leg, Right lower leg, Left lower leg Body parts bathed by helper: Back, Buttocks  Bathing assist Assist Level:  (Mod A)      Upper Body Dressing/Undressing Upper body dressing Upper body dressing/undressing activity did not occur: Refused What is the patient wearing?: Pull over shirt/dress     Pull over shirt/dress - Perfomed by patient: Thread/unthread right sleeve, Thread/unthread left sleeve, Put head through opening Pull over shirt/dress - Perfomed by helper: Pull shirt over trunk        Upper body assist Assist Level: Touching or steadying assistance(Pt > 75%)      Lower Body Dressing/Undressing Lower body dressing Lower body dressing/undressing activity did not occur: N/A What is the patient wearing?: Non-skid slipper socks, Pants     Pants- Performed by patient: Thread/unthread right pants leg Pants- Performed by helper: Thread/unthread left pants leg, Pull pants up/down   Non-skid slipper socks- Performed by helper: Don/doff right sock, Don/doff left sock   Socks - Performed by helper: Don/doff left sock, Don/doff  right sock           TED Hose - Performed by helper: Don/doff left TED hose, Don/doff right TED hose  Lower body assist Assist for lower body dressing:  (Max A)      Toileting Toileting Toileting activity did not occur: No continent bowel/bladder event   Toileting steps completed by helper: Adjust clothing prior to toileting, Performs  perineal hygiene, Adjust clothing after toileting Toileting Assistive Devices: Grab bar or rail  Toileting assist Assist level: Two helpers   Transfers Chair/bed transfer   Chair/bed transfer method: Squat pivot Chair/bed transfer assist level: Maximal assist (Pt 25 - 49%/lift and lower) Chair/bed transfer assistive device: Armrests, Bedrails     Locomotion Ambulation     Max distance: 15 ft Assist level: Moderate assist (Pt 50 - 74%)   Wheelchair   Type: Manual Max wheelchair distance: 50 Assist Level: Supervision or verbal cues  Cognition Comprehension Comprehension assist level: Understands basic 75 - 89% of the time/ requires cueing 10 - 24% of the time  Expression Expression assist level: Expresses complex 90% of the time/cues < 10% of the time  Social Interaction Social Interaction assist level: Interacts appropriately with others with medication or extra time (anti-anxiety, antidepressant).  Problem Solving Problem solving assist level: Solves complex 90% of the time/cues < 10% of the time  Memory Memory assist level: Recognizes or recalls 25 - 49% of the time/requires cueing 50 - 75% of the time    Medical Problem List and Plan: 1.  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  DVT Prophylaxis/Anticoagulation: SCDs.   Vascular study neg for DVT on 1/11 3. Pain Management: Hydrocodone as needed  Robaxin scheduled 1/11, increased 1/12, changed to PRN on 1/18  Gabapentin 100 TID started 1/12, increased to 300 on 1/14, 400 on 1/17 due to CrCl, decreased back to 100 TID on 1/18 due to potential interference with cognition  Kpad added 1/12  Lidoderm patch added 1/14  Improved. 4. Acute blood loss anemia. Continue iron supplement.   Hb 9.2 on 1/15  Cont to monitor 5. Neuropsych: This patient is capable of making decisions on his own  behalf. 6. Skin/Wound Care: Bilateral buttocks and sacral ulcer. Follow-up WOC 7. Fluids/Electrolytes/Nutrition: Routine I&O 8. Hypertension.   Verapamil 240 mg daily, increased to 360 on 1/12, reduced back to 240 on 1/14.   WNL on 1/18 9. CRI stage III.   With AKI, Cr 1.75 on 1/15  Cont to monitor  Encourage fluids  Labs ordered for tomorrow 10. Urinary retention.   Flomax started 1/11 11. Hypokalemia: Resolved   K+ 4.2 on 1/15  Cont to monitor 12. Constipation.   KUB 1/12 reviewed showing constipation  Improving, senna decreased to qhs, d/ced on 1/18 13. Hyperglycemia  Will cont to monitor 14. Hypoalbuminemia  Supplement initiated 1/11 15. Leukocytosis: Resolved  Afebrile  WBcs 9.4 on 1/15  UA+, Ucx NG  Empiric Keflex d/ced 16. Sleep disturbance  Trazodone PRN ordered  Improving  LOS (Days) 8 A FACE TO FACE EVALUATION WAS PERFORMED  Joannah Gitlin [name] 01/21/2016 9:42 AM

## 2016-01-21 NOTE — Progress Notes (Signed)
Physical Therapy Weekly Progress Note  Patient Details  Name: Marvin Morrison MRN: [id number] Date of Birth: 19-Mar-1940  Beginning of progress report period: 01/14/16 End of progress report period: 01/21/16 Today's Date: 01/21/2016 PT Individual Time: 8616-8372 PT Individual Time Calculation (min): 45 min   Patient has met 0 of 4 short term goals.  He does participate to the best of his ability, but performance varies greatly. Pt is limited by neuropathic pain with movement.  Patient continues to demonstrate the following deficits decreased cardiorespiratoy endurance, impaired timing and sequencing, unbalanced muscle activation and decreased coordination, decreased awareness and decreased standing balance, decreased balance strategies and difficulty maintaining precautions, decreased sensation and therefore will continue to benefit from skilled PT intervention to increase functional independence with mobility. Pt had tremulous movement bil LEs at all times, and intermittently with bil UEs as well.   Patient progressing toward long term goals..  Continue plan of care.  PT Short Term Goals Week 1:  PT Short Term Goal 1 (Week 1): Patient will transfer bed <> wheelchair with supervision. PT Short Term Goal 1 - Progress (Week 1): Not met PT Short Term Goal 2 (Week 1): Patient will ambulate 150 ft using RW with supervision. PT Short Term Goal 2 - Progress (Week 1): Not met PT Short Term Goal 3 (Week 1): Patient will negotiate up/down 4 stairs with 1 rail and mod A.  PT Short Term Goal 3 - Progress (Week 1): Not met PT Short Term Goal 4 (Week 1): Patient will perform bed mobility with supervision.  PT Short Term Goal 4 - Progress (Week 1): Progressing toward goal  Skilled Therapeutic Interventions/Progress Updates: bed mobility for RN to do wound care.  Sit <> stand from raised bed, R hand on rail and L hand on RW, x 2 with mod assist. Pt stated his bed at home is high.  Stand pivot to Wc to L with  mod/max assist.  W/c propulsion using bil LEs for neuro re-ed.  Standing bil knee flex/ext with bil UEs on railing in hall.  Pt left resting in w/c with quick release belt applied and all needs within reach.    Therapy Documentation Precautions:  Precautions Precautions: Fall, Back Precaution Booklet Issued: No Restrictions Weight Bearing Restrictions: No Other Position/Activity Restrictions: Back precautions Pain: Pain Assessment Pain Score: 7 , medicated at start of session      See Function Navigator for Current Functional Status.  Therapy/Group: Individual Therapy  Marvin Morrison 01/21/2016, 10:24 AM

## 2016-01-21 NOTE — Progress Notes (Signed)
Nutrition Follow-up  DOCUMENTATION CODES:   Not applicable  INTERVENTION:  Provide 30 ml Prostat po once daily, each supplement provides 100 kcal and 15 grams of protein.   Provide Boost Plus po once daily, each supplement provides 360 kcal and 14 grams of protein.  Encourage adequate PO intake.   NUTRITION DIAGNOSIS:   Increased nutrient needs related to wound healing as evidenced by estimated needs; ongoing  GOAL:   Patient will meet greater than or equal to 90% of their needs; met  MONITOR:   PO intake, Supplement acceptance, Labs, Weight trends, Skin, I & O's  REASON FOR ASSESSMENT:   Malnutrition Screening Tool    ASSESSMENT:   76 y.o. right handed male with history of hypertension, chronic renal insufficiency stage III He has essentially been wheelchair and bed bound for 3 months with back pain radiating to the lower extremities as well as bilateral buttock and sacral ulcer. 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 No change with conservative care. Underwent laminectomy L-2-L5 with decompression of L2-3-4 and L5 and S1 nerve roots. Discectomy L5-S1 01/11/2016.  Intake has improved. Meal completion has been 80-100%. Pt currently has Boost Plus ordered with varied intake. Pt also has Prostat ordered and has been consuming them. RD to modify nutritional supplements as intake has improved. Pt encouraged to eat his foods at meals and to drink his supplements.   Diet Order:  Diet regular Room service appropriate? Yes; Fluid consistency: Thin  Skin:  Wound (see comment) (Unstageable/stage II to buttocks, incision on back)  Last BM:  1/18  Height:   Ht Readings from Last 1 Encounters:  01/13/16 6' (1.829 m)    Weight:   Wt Readings from Last 1 Encounters:  01/20/16 179 lb 10.8 oz (81.5 kg)    Ideal Body Weight:  80.9 kg  BMI:   Body mass index is 24.37 kg/m.  Estimated Nutritional Needs:   Kcal:  2050-2250  Protein:  105-115 grams  Fluid:  2- 2.2 L/day  EDUCATION NEEDS:   No education needs identified at this time  Corrin Parker, MS, RD, LDN Pager # [phone] After hours/ weekend pager # [phone]

## 2016-01-21 NOTE — Progress Notes (Signed)
Occupational Therapy Session Note  Patient Details  Name: Marvin Morrison MRN: [id number] Date of Birth: 1940-02-21  Today's Date: 01/21/2016 OT Individual Time: 1300-1400 OT Individual Time Calculation (min): 60 min    Short Term Goals: Week 2:  OT Short Term Goal 1 (Week 2): Pt will complete LB dressing using ADL AE with Mod A OT Short Term Goal 2 (Week 2): Pt will complete toilet transfers with Mod A and RW OT Short Term Goal 3 (Week 2): Pt will tolerate 5 minutes of standing activities with Mod A for balance   Skilled Therapeutic Interventions/Progress Updates:    Upon entering the room, pt supine in bed with wife present in the room. Pt verbalized, "I need help cleaning myself up". Pt had been incontinent of bowel and needing assistance with hygiene. Pt rolling L <> R with supervision - min A with increased time. RN notified to check skin integrity. Once pt fully cleaned he was incontinent of bowel a second time during session and at which point pt needing assistance again for hygiene. Pt remained supine in bed with call bell and all needed items within reach upon exiting the room.    Therapy Documentation Precautions:  Precautions Precautions: Fall, Back Precaution Booklet Issued: No Restrictions Weight Bearing Restrictions: No Other Position/Activity Restrictions: Back precautions General: General OT Amount of Missed Time: 30 Minutes Vital Signs: Therapy Vitals Temp: 97.6 F (36.4 C) Temp Source: Oral Pulse Rate: 74 Resp: 17 BP: 120/64 Patient Position (if appropriate): Lying Oxygen Therapy SpO2: 99 % O2 Device: Not Delivered Pain:   ADL: ADL ADL Comments: Please see functional navigator Exercises:   Other Treatments:    See Function Navigator for Current Functional Status.   Therapy/Group: Individual Therapy  Gypsy Decant 01/21/2016, 4:09 PM

## 2016-01-22 ENCOUNTER — Inpatient Hospital Stay (HOSPITAL_COMMUNITY): Payer: Medicare Other | Admitting: Physical Therapy

## 2016-01-22 ENCOUNTER — Inpatient Hospital Stay (HOSPITAL_COMMUNITY): Payer: Medicare Other | Admitting: Occupational Therapy

## 2016-01-22 LAB — CBC WITH DIFFERENTIAL/PLATELET
Basophils Absolute: 0.1 10*3/uL (ref 0.0–0.1)
Basophils Relative: 1 %
Eosinophils Absolute: 0.9 10*3/uL — ABNORMAL HIGH (ref 0.0–0.7)
Eosinophils Relative: 12 %
HEMATOCRIT: 26.2 % — AB (ref 39.0–52.0)
HEMOGLOBIN: 8.5 g/dL — AB (ref 13.0–17.0)
LYMPHS ABS: 1.1 10*3/uL (ref 0.7–4.0)
LYMPHS PCT: 15 %
MCH: 31.4 pg (ref 26.0–34.0)
MCHC: 32.4 g/dL (ref 30.0–36.0)
MCV: 96.7 fL (ref 78.0–100.0)
MONOS PCT: 7 %
Monocytes Absolute: 0.5 10*3/uL (ref 0.1–1.0)
NEUTROS PCT: 65 %
Neutro Abs: 5 10*3/uL (ref 1.7–7.7)
Platelets: 309 10*3/uL (ref 150–400)
RBC: 2.71 MIL/uL — ABNORMAL LOW (ref 4.22–5.81)
RDW: 15 % (ref 11.5–15.5)
WBC: 7.6 10*3/uL (ref 4.0–10.5)

## 2016-01-22 LAB — BASIC METABOLIC PANEL
Anion gap: 9 (ref 5–15)
BUN: 23 mg/dL — ABNORMAL HIGH (ref 6–20)
CHLORIDE: 104 mmol/L (ref 101–111)
CO2: 25 mmol/L (ref 22–32)
CREATININE: 1.28 mg/dL — AB (ref 0.61–1.24)
Calcium: 8.2 mg/dL — ABNORMAL LOW (ref 8.9–10.3)
GFR calc non Af Amer: 52 mL/min — ABNORMAL LOW (ref >60)
Glucose, Bld: 117 mg/dL — ABNORMAL HIGH (ref 65–99)
POTASSIUM: 4.2 mmol/L (ref 3.5–5.1)
SODIUM: 138 mmol/L (ref 135–145)

## 2016-01-22 NOTE — Progress Notes (Addendum)
Occupational Therapy Session Note  Patient Details  Name: Marvin Morrison MRN: [id number] Date of Birth: 07-13-1940  Today's Date: 01/22/2016 OT Individual Time: 0800-0900 OT Individual Time Calculation (min): 60 min   Short Term Goals: Week 2:  OT Short Term Goal 1 (Week 2): Pt will complete LB dressing using ADL AE with Mod A OT Short Term Goal 2 (Week 2): Pt will complete toilet transfers with Mod A and RW OT Short Term Goal 3 (Week 2): Pt will tolerate 5 minutes of standing activities with Mod A for balance  Skilled Therapeutic Interventions/Progress Updates:    1:1 OT session focused on increased independence with bathing/dressing, activity tolerance, and improved sit<>stand. Pt with improved pain management this am, squat-pivot transfer from bed>wc with Mod A. Bathing/grooming completed seated in wc at the sink using long-handled sponge. Stand-pivot transfer onto raised toilet above commode with Max A + grab bars for timed toileting. Pt successfully moved bowels, with total A for toileting and clothing management. Pt required Max A sit<>stand and Max A to maintain standing balance to pull pants over hips. Addressed LB dressing using long-handled Ae. Pt continues to have UE tremors, but is slightly improved today making LB dressing more successful.  Discussed timed toileting with patient regarding urinary incontinence in order to remove condom catheter. Pt strongly declined, but will continue conversation with pt. Pt left seated in wc with needs met and L LE elevated.   Therapy Documentation Precautions:  Precautions Precautions: Fall, Back Precaution Booklet Issued: No Restrictions Weight Bearing Restrictions: No Other Position/Activity Restrictions: Back precautions Pain: Pain Assessment Pain Assessment: 0-10 Pain Score: 8  Pain Location: Leg Pain Orientation: Right Pain Descriptors / Indicators: Aching Pain Onset: With Activity Pain Intervention(s): Repositioned (RN  pre-medicated ) ADL: ADL ADL Comments: Please see functional navigator  See Function Navigator for Current Functional Status.  Therapy/Group: Individual Therapy  Valma Cava 01/22/2016, 12:34 PM

## 2016-01-22 NOTE — Progress Notes (Signed)
[facility] PHYSICAL MEDICINE & REHABILITATION     PROGRESS NOTE  Subjective/Complaints:  Pt still feels numb in feet , poor sensation for BM  ROS: Denies CP, SOB, N/V/D.  Objective: Vital Signs: Blood pressure 124/62, pulse 82, temperature 98 F (36.7 C), temperature source Oral, resp. rate 18, height 6' (1.829 m), weight 81.5 kg (179 lb 10.8 oz), SpO2 96 %. No results found.  Recent Labs  01/22/16 0510  WBC 7.6  HGB 8.5*  HCT 26.2*  PLT 309    Recent Labs  01/22/16 0510  NA 138  K 4.2  CL 104  GLUCOSE 117*  BUN 23*  CREATININE 1.28*  CALCIUM 8.2*   CBG (last 3)  No results for input(s): GLUCAP in the last 72 hours.  Wt Readings from Last 3 Encounters:  01/20/16 81.5 kg (179 lb 10.8 oz)  01/11/16 75.7 kg (166 lb 14.2 oz)  12/08/15 76.2 kg (168 lb)    Physical Exam:  BP 124/62 (BP Location: Left Arm)   Pulse 82   Temp 98 F (36.7 C) (Oral)   Resp 18   Ht 6' (1.829 m)   Wt 81.5 kg (179 lb 10.8 oz)   SpO2 96%   BMI 24.37 kg/m  Gen: NAD. Well-developed.  HENT: Normocephalic. Atraumatic. Eyes: EOM are normal. No discharge. Cardiovascular: RRR.  No JVD. Respiratory: Effort normal and breath sounds normal.  GI: Soft, BS+ Musculoskeletal: He exhibits no edema, no tenderness.  Neurological: He is alert.  B/l UE strength 4+/5 B/l LE: 4/5 HF,KE  LLE ADF 4-/5 (unchanged) RLE ADF 4/5 Skin:  Stage II ulcer left and right buttocks - not examined today.  Scattered abrasions. Psychiatric: He has a normal mood and affect. His behavior is normal.   Assessment/Plan: 1. 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 Physiatrist is providing close team supervision and 24 hour management of active medical problems listed below. Physiatrist and rehab team  continue to assess barriers to discharge/monitor patient progress toward functional and medical goals.  Function:  Bathing Bathing position Bathing activity did not occur: N/A Position: Wheelchair/chair at sink  Bathing parts Body parts bathed by patient: Right arm, Left arm, Chest, Abdomen, Front perineal area, Right upper leg, Left upper leg, Right lower leg, Left lower leg Body parts bathed by helper: Back, Buttocks  Bathing assist Assist Level:  (Mod A)      Upper Body Dressing/Undressing Upper body dressing Upper body dressing/undressing activity did not occur: Refused What is the patient wearing?: Pull over shirt/dress     Pull over shirt/dress - Perfomed by patient: Thread/unthread right sleeve, Thread/unthread left sleeve, Put head through opening Pull over shirt/dress - Perfomed by helper: Pull shirt over trunk        Upper body assist Assist Level: Touching or steadying assistance(Pt > 75%)      Lower Body Dressing/Undressing Lower body dressing Lower body dressing/undressing activity did not occur: N/A What is the patient wearing?: Non-skid slipper socks, Pants     Pants- Performed by patient: Thread/unthread right pants leg Pants- Performed by helper: Thread/unthread left pants leg, Pull pants up/down   Non-skid slipper socks- Performed by helper: Don/doff right sock, Don/doff left sock   Socks - Performed by helper: Don/doff left sock, Don/doff right sock           TED Hose - Performed by helper: Don/doff  left TED hose, Don/doff right TED hose  Lower body assist Assist for lower body dressing:  (Max A)      Toileting Toileting Toileting activity did not occur: No continent bowel/bladder event   Toileting steps completed by helper: Adjust clothing prior to toileting, Performs perineal hygiene, Adjust clothing after toileting Toileting Assistive Devices: Grab bar or rail  Toileting assist Assist level: Two helpers   Transfers Chair/bed transfer    Chair/bed transfer method: Squat pivot Chair/bed transfer assist level: Moderate assist (Pt 50 - 74%/lift or lower) Chair/bed transfer assistive device: Bedrails, Armrests     Locomotion Ambulation     Max distance: 16 Assist level: Moderate assist (Pt 50 - 74%)   Wheelchair   Type: Manual Max wheelchair distance: 50 Assist Level: Supervision or verbal cues  Cognition Comprehension Comprehension assist level: Understands basic 75 - 89% of the time/ requires cueing 10 - 24% of the time  Expression Expression assist level: Expresses complex 90% of the time/cues < 10% of the time  Social Interaction Social Interaction assist level: Interacts appropriately with others with medication or extra time (anti-anxiety, antidepressant).  Problem Solving Problem solving assist level: Solves complex 90% of the time/cues < 10% of the time  Memory Memory assist level: Recognizes or recalls 25 - 49% of the time/requires cueing 50 - 75% of the time    Medical Problem List and Plan: 1.  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  DVT Prophylaxis/Anticoagulation: SCDs.   Vascular study neg for DVT on 1/11 3. Pain Management: Hydrocodone as needed  Robaxin scheduled 1/11, increased 1/12, changed to PRN on 1/18  Gabapentin 100 TID started 1/12, increased to 300 on 1/14, 400 on 1/17 due to CrCl, decreased back to 100 TID on 1/18 due to potential interference with cognition  Kpad added 1/12  Lidoderm patch added 1/14  Improved. 4. Acute blood loss anemia. Continue iron supplement.   Hb 9.2 on 1/15, down to 8.5 on 1/19 cont to monitor  5. Neuropsych: This patient is capable of making decisions on his own behalf. 6. Skin/Wound Care: Bilateral buttocks and sacral ulcer. Follow-up WOC 7. Fluids/Electrolytes/Nutrition: Routine I&O 8. Hypertension.   Verapamil 240  mg daily, increased to 360 on 1/12, reduced back to 240 on 1/14.    Vitals:   01/21/16 1300 01/22/16 0439  BP: 120/64 124/62  Pulse: 74 82  Resp: 17 18  Temp: 97.6 F (36.4 C) 98 F (36.7 C)   9. CRI stage III.   With AKI, Cr 1.75 on 1/15, 1.28 on 1/19  Resolved GFR normalized 10. Urinary retention.   Flomax started 1/11 11. Hypokalemia: Resolved   K+ 4.2 on 1/15  Cont to monitor 12. Constipation.   Pt incont of stool  Improving, senna decreased to qhs, d/ced on 1/18 13. Hyperglycemia, glucose 117 this am   14. Hypoalbuminemia  Supplement initiated 1/11 15. Leukocytosis: Resolved  Afebrile  WBcs 9.4 on 1/15  UA+, Ucx NG  Empiric Keflex d/ced 16. Sleep disturbance  Trazodone PRN ordered  Improving  LOS (Days) 9 A FACE TO FACE EVALUATION WAS PERFORMED  Charlett Blake 01/22/2016 6:54 AM

## 2016-01-22 NOTE — Progress Notes (Signed)
Physical Therapy Session Note  Patient Details  Name: Marvin Morrison MRN: [id number] Date of Birth: [birth year]  Today's Date: 01/22/2016 PT Individual Time: 1002-1110 PT Individual Time Calculation (min): 68 min   Short Term Goals: Week 1:  PT Short Term Goal 1 (Week 1): Patient will transfer bed <> wheelchair with supervision. PT Short Term Goal 1 - Progress (Week 1): Not met PT Short Term Goal 2 (Week 1): Patient will ambulate 150 ft using RW with supervision. PT Short Term Goal 2 - Progress (Week 1): Not met PT Short Term Goal 3 (Week 1): Patient will negotiate up/down 4 stairs with 1 rail and mod A.  PT Short Term Goal 3 - Progress (Week 1): Not met PT Short Term Goal 4 (Week 1): Patient will perform bed mobility with supervision.  PT Short Term Goal 4 - Progress (Week 1): Progressing toward goal Week 2:     Skilled Therapeutic Interventions/Progress Updates:    Pt was found in room sitting in W/C . Pt self-propelled in W/C 175 ft to rehab gym with supervision and no rest breaks. 10 min of BLE desensitization via toe, ankle, knee and hip AAROM with initial light tough progressing to higher pressure.    Pt ambulated 60 ft and 40 ft with RW and min assist with verbal cues for sequence and posture. Pt described having acute neuropathic pain down posterior glutes and thighs during the last episode of ambulation that stopped once sitting down.     Pt performed two trials of standing tolerance with the dynavision. Pt scored 15 and 36 with min assist and walker. Pt terminated last bout 15 seconds before timer ran out due to fatigue and BLE neuropathic pain.   Sit-to-stand transfers from W/C throughout therapy required min assist with verbal cues for handplacement.  Pt self-propelled in W/C 350 ft to return to room with one rest break needed. Pt perfomed an ambulatory transfer with RW and min assist to sit at EOB. Pt required supervision to go from sit-to-supine. Pt was left in bed with all  needs within reach   Therapy Documentation Precautions:  Precautions Precautions: Fall, Back Precaution Booklet Issued: No Restrictions Weight Bearing Restrictions: No Other Position/Activity Restrictions: Back precautions General:    See Function Navigator for Current Functional Status.   Therapy/Group: Individual Therapy  Rosendo Gros 01/22/2016, 10:44 AM

## 2016-01-22 NOTE — Progress Notes (Signed)
Physical Therapy Session Note  Patient Details  Name: Marvin Morrison MRN: [id number] Date of Birth: [birth year]  Today's Date: 01/22/2016 PT Individual Time: 0630-1601 PT Individual Time Calculation (min): 80 min   Short Term Goals: Week 1:  PT Short Term Goal 1 (Week 1): Patient will transfer bed <> wheelchair with supervision. PT Short Term Goal 1 - Progress (Week 1): Not met PT Short Term Goal 2 (Week 1): Patient will ambulate 150 ft using RW with supervision. PT Short Term Goal 2 - Progress (Week 1): Not met PT Short Term Goal 3 (Week 1): Patient will negotiate up/down 4 stairs with 1 rail and mod A.  PT Short Term Goal 3 - Progress (Week 1): Not met PT Short Term Goal 4 (Week 1): Patient will perform bed mobility with supervision.  PT Short Term Goal 4 - Progress (Week 1): Progressing toward goal Week 2:  PT Short Term Goal 1 (Week 2): Patient will ambulate 50 ft using RW with min A consistently PT Short Term Goal 2 (Week 2): Patient will be able to transfer from bed to W/C using RW with Min A  PT Short Term Goal 3 (Week 2): Patient will be able to ascend and descend 2 steps using LRAD with Mod A consistently PT Short Term Goal 4 (Week 2): patient will demonstrate standing tolerance of 5 minutes using RW and min A  Skilled Therapeutic Interventions/Progress Updates:  Pt was found in bed. 2 min of desensitization for BLE was provided prior to transfer out of bed. Pt performed supine to sit with supervision and minimal verbal cues for sequence. Pt performed a stand-pivot transfer into W/C.    Stand pivot and sit-to-stand transfers throughout therapy required min A with lifting and lowering and max verbal cues for reaching back for handrail.  Pt self-propelled in W/C with supervision 162 ft to rehab gym.   Pt ascended forward and descended backwards six 3 inch steps with BUE support and mod-max assist with verbal cue and demonstration for sequence and handplacement.   Pt self-propelled  in W/C with supervision for 160 ft to arrive to day-room.  Pt ambulated 60 ft with FW and CGA - min A on carpeted surface to perform a stand-pivot onto mat. After a quick rest break, pt performed another stand-pivot transfer with RW into W/C.   Sit-to-stand and standing tolerance for 1 min on the Dynavision required min assist with RW .   Pt performed 8 min on nustep for NMR. Pt performed a stand pivot transfer to enter nustep and return to W/C. However, last stand pivot transfer into W/C was total assist due to pt's fatigue and legs beginning to buckle.   Pt displayed several episode of acute neuropathic pain down posterior BLE throughout therapy that lasted less than a minute. Pt required rest till pain subsided.    Pt was rolled back to room and left in W/C with all needs within reach.      Therapy Documentation Precautions:  Precautions Precautions: Fall, Back Precaution Booklet Issued: No Restrictions Weight Bearing Restrictions: No Other Position/Activity Restrictions: Back precautions   See Function Navigator for Current Functional Status.   Therapy/Group: Individual Therapy  Rosendo Gros 01/22/2016, 3:05 PM

## 2016-01-23 ENCOUNTER — Inpatient Hospital Stay (HOSPITAL_COMMUNITY): Payer: Medicare Other | Admitting: Occupational Therapy

## 2016-01-23 DIAGNOSIS — M5415 Radiculopathy, thoracolumbar region: Secondary | ICD-10-CM

## 2016-01-23 LAB — URINE CULTURE: Culture: >=100000 — AB

## 2016-01-23 LAB — GLUCOSE, CAPILLARY: Glucose-Capillary: 112 mg/dL — ABNORMAL HIGH (ref 65–99)

## 2016-01-23 NOTE — Progress Notes (Signed)
Occupational Therapy Session Note  Patient Details  Name: CARLESS SLATTEN MRN: [id number] Date of Birth: January 26, 1940  Today's Date: 01/23/2016 OT Individual Time: 0900-1000 OT Individual Time Calculation (min): 60 min    Short Term Goals: Week 2:  OT Short Term Goal 1 (Week 2): Pt will complete LB dressing using ADL AE with Mod A OT Short Term Goal 2 (Week 2): Pt will complete toilet transfers with Mod A and RW OT Short Term Goal 3 (Week 2): Pt will tolerate 5 minutes of standing activities with Mod A for balance  Skilled Therapeutic Interventions/Progress Updates:    1:1 OT session focused on modified bathing/dressing, improved sit<>stand, activity tolerance, and standing endurance. Pt transferred to EOB with Min A, Stand-pivot Mod A. Bathing/dressing completed at the sink with focus on LB ADL modifications with back precautions. Pt required assistance to wash feet 2/2 b LE hypersensitivity this Am. Pt tolerated 2 minutes standing and was able to remove unilateral UE from RW to assist with pulling pants over hips briefly. Pt incontinent of stool requiring total A for brief change and peri-care. B UE strength coordination with hair blow drying task in sitting. Pt able to integrate B UE 's with 2 rest breaks. Pt left seated in wc at end of session with R UE elevated and needs met.   Therapy Documentation Precautions:  Precautions Precautions: Fall, Back Precaution Booklet Issued: No Restrictions Weight Bearing Restrictions: No Other Position/Activity Restrictions: Back precautions Pain: Pain Assessment Pain Assessment: 0-10 Pain Score: 0-No pain ADL: ADL ADL Comments: Please see functional navigator  See Function Navigator for Current Functional Status.   Therapy/Group: Individual Therapy  Valma Cava 01/23/2016, 9:54 AM

## 2016-01-23 NOTE — Progress Notes (Signed)
[facility] PHYSICAL MEDICINE & REHABILITATION     PROGRESS NOTE  Subjective/Complaints:  Up in bed eating. Still having a lot of pain in right>left thigh---kpad helps. Swelling better  ROS: pt denies nausea, vomiting, diarrhea, cough, shortness of breath or chest pain   Objective: Vital Signs: Blood pressure 123/72, pulse 82, temperature 98 F (36.7 C), temperature source Oral, resp. rate 17, height 6' (1.829 m), weight 81.5 kg (179 lb 10.8 oz), SpO2 96 %. No results found.  Recent Labs  01/22/16 0510  WBC 7.6  HGB 8.5*  HCT 26.2*  PLT 309    Recent Labs  01/22/16 0510  NA 138  K 4.2  CL 104  GLUCOSE 117*  BUN 23*  CREATININE 1.28*  CALCIUM 8.2*   CBG (last 3)  No results for input(s): GLUCAP in the last 72 hours.  Wt Readings from Last 3 Encounters:  01/20/16 81.5 kg (179 lb 10.8 oz)  01/11/16 75.7 kg (166 lb 14.2 oz)  12/08/15 76.2 kg (168 lb)    Physical Exam:  BP 123/72 (BP Location: Right Arm)   Pulse 82   Temp 98 F (36.7 C) (Oral)   Resp 17   Ht 6' (1.829 m)   Wt 81.5 kg (179 lb 10.8 oz)   SpO2 96%   BMI 24.37 kg/m  Gen: NAD. Well-developed.  HENT: Normocephalic. Atraumatic. Eyes: EOM are normal. No discharge. Cardiovascular: RRR no jvd. Respiratory: clear bilt.  GI: Soft, BS+ Musculoskeletal: He exhibits no edema, no tenderness.  Neurological: He is alert.  B/l UE strength 4+/5 B/l LE: 4/5 HF,KE  LLE ADF 4-/5 (stable) RLE ADF 4/5 Skin:  Stage II ulcer left and right buttocks (not visualized) Scattered abrasions. Psychiatric: He has a normal mood and affect. His behavior is normal.   Assessment/Plan: 1. 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 Physiatrist is providing close team supervision and 24 hour management of  active medical problems listed below. Physiatrist and rehab team continue to assess barriers to discharge/monitor patient progress toward functional and medical goals.  Function:  Bathing Bathing position Bathing activity did not occur: N/A Position: Wheelchair/chair at sink  Bathing parts Body parts bathed by patient: Right arm, Left arm, Chest, Abdomen, Left upper leg, Right upper leg, Right lower leg, Left lower leg Body parts bathed by helper: Buttocks, Front perineal area  Bathing assist Assist Level: Touching or steadying assistance(Pt > 75%)      Upper Body Dressing/Undressing Upper body dressing Upper body dressing/undressing activity did not occur: Refused What is the patient wearing?: Pull over shirt/dress     Pull over shirt/dress - Perfomed by patient: Thread/unthread right sleeve, Thread/unthread left sleeve, Put head through opening Pull over shirt/dress - Perfomed by helper: Pull shirt over trunk        Upper body assist Assist Level: Touching or steadying assistance(Pt > 75%)      Lower Body Dressing/Undressing Lower body dressing Lower body dressing/undressing activity did not occur: N/A What is the patient wearing?: Non-skid slipper socks, Pants     Pants- Performed by patient: Thread/unthread right pants leg, Thread/unthread left pants leg Pants- Performed by helper: Pull pants up/down   Non-skid slipper socks- Performed by helper: Don/doff right sock, Don/doff left sock   Socks - Performed by helper: Don/doff left sock, Don/doff right sock  TED Hose - Performed by helper: Don/doff left TED hose, Don/doff right TED hose  Lower body assist Assist for lower body dressing: Touching or steadying assistance (Pt > 75%)      Toileting Toileting Toileting activity did not occur: No continent bowel/bladder event   Toileting steps completed by helper: Adjust clothing prior to toileting, Performs perineal hygiene Toileting Assistive Devices: Grab bar  or rail  Toileting assist Assist level: Touching or steadying assistance (Pt.75%)   Transfers Chair/bed transfer   Chair/bed transfer method: Ambulatory Chair/bed transfer assist level: Touching or steadying assistance (Pt > 75%) Chair/bed transfer assistive device: Walker, Armrests, Bedrails     Locomotion Ambulation     Max distance: 56 ft Assist level: Touching or steadying assistance (Pt > 75%)   Wheelchair   Type: Manual Max wheelchair distance: 175 ft Assist Level: Supervision or verbal cues  Cognition Comprehension Comprehension assist level: Understands basic 75 - 89% of the time/ requires cueing 10 - 24% of the time  Expression Expression assist level: Expresses complex 90% of the time/cues < 10% of the time  Social Interaction Social Interaction assist level: Interacts appropriately with others with medication or extra time (anti-anxiety, antidepressant).  Problem Solving Problem solving assist level: Solves complex 90% of the time/cues < 10% of the time  Memory Memory assist level: Recognizes or recalls 90% of the time/requires cueing < 10% of the time    Medical Problem List and Plan: 1.  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  DVT Prophylaxis/Anticoagulation: SCDs.   Vascular study neg for DVT on 1/11 3. Pain Management: Hydrocodone as needed  Robaxin scheduled 1/11, increased 1/12, changed to PRN on 1/18  Gabapentin 100 TID started 1/12, increased to 300 on 1/14, 400 on 1/17 due to CrCl, decreased back to 100 TID on 1/18 due to potential interference with cognition  Kpad added 1/12  Lidoderm patch added 1/14  Improved overall---will likely be limited in rx due to cognitive/neurosedating side effects 4. Acute blood loss anemia. Continue iron supplement.   Hb 9.2 on 1/15, down to 8.5 on 1/19 cont to monitor  5.  Neuropsych: This patient is capable of making decisions on his own behalf. 6. Skin/Wound Care: Bilateral buttocks and sacral ulcer. Follow-up WOC 7. Fluids/Electrolytes/Nutrition: Routine I&O 8. Hypertension.   Verapamil 240 mg daily, increased to 360 on 1/12, reduced back to 240 on 1/14---likely could be reduced further given recent bp's. Observe today    Vitals:   01/22/16 2159 01/23/16 0559  BP: (!) 145/71 123/72  Pulse:  82  Resp:  17  Temp:  98 F (36.7 C)   9. CRI stage III.   With AKI, Cr 1.75 on 1/15, 1.28 on 1/19  Resolved GFR normalized 10. Urinary retention.   Flomax started 1/11 11. Hypokalemia: Resolved   K+ 4.2 on 1/15  Cont to monitor 12. Constipation.   Pt incont of stool, liquid stool yesterday  Improved, senna d/ced on 1/18 13. Hyperglycemia, glucose 117 this am   14. Hypoalbuminemia  Supplement initiated 1/11 15. Leukocytosis: Resolved    16. Sleep disturbance  Trazodone PRN ordered  Improving  LOS (Days) 10 A FACE TO FACE EVALUATION WAS PERFORMED  Ebunoluwa Gernert T 01/23/2016 8:16 AM

## 2016-01-24 ENCOUNTER — Inpatient Hospital Stay (HOSPITAL_COMMUNITY): Payer: Medicare Other | Admitting: Physical Therapy

## 2016-01-24 DIAGNOSIS — K5909 Other constipation: Secondary | ICD-10-CM

## 2016-01-24 MED ORDER — CIPROFLOXACIN HCL 500 MG PO TABS
250.0000 mg | ORAL_TABLET | Freq: Two times a day (BID) | ORAL | Status: AC
  Administered 2016-01-24 – 2016-01-26 (×6): 250 mg via ORAL
  Filled 2016-01-24 (×6): qty 1

## 2016-01-24 NOTE — Progress Notes (Addendum)
[place] PHYSICAL MEDICINE & REHABILITATION     PROGRESS NOTE  Subjective/Complaints:  Complains of right shoulder/back pain which came on suddenly this morning. Urine frequency?  ROS: pt denies nausea, vomiting, diarrhea, cough, shortness of breath or chest pain    Objective: Vital Signs: Blood pressure (!) 88/65, pulse 78, temperature 98.1 F (36.7 C), temperature source Oral, resp. rate 18, height 6' (1.829 m), weight 81.5 kg (179 lb 10.8 oz), SpO2 96 %. No results found.  Recent Labs  01/22/16 0510  WBC 7.6  HGB 8.5*  HCT 26.2*  PLT 309    Recent Labs  01/22/16 0510  NA 138  K 4.2  CL 104  GLUCOSE 117*  BUN 23*  CREATININE 1.28*  CALCIUM 8.2*   CBG (last 3)   Recent Labs  01/23/16 1145  GLUCAP 112*    Wt Readings from Last 3 Encounters:  01/20/16 81.5 kg (179 lb 10.8 oz)  01/11/16 75.7 kg (166 lb 14.2 oz)  12/08/15 76.2 kg (168 lb)    Physical Exam:  BP (!) 88/65 (BP Location: Right Arm)   Pulse 78   Temp 98.1 F (36.7 C) (Oral)   Resp 18   Ht 6' (1.829 m)   Wt 81.5 kg (179 lb 10.8 oz)   SpO2 96%   BMI 24.37 kg/m  Gen: NAD. Well-developed.  HENT: Normocephalic. Atraumatic. Eyes: EOM are normal. No discharge. Cardiovascular: RRR without JVD. Respiratory: CTA bilaterally.  GI: Soft, BS+ Musculoskeletal: He exhibits no edema. Tenderness along right medial scapula/rhomboid area with palpation/ROM--no definite spasm appreciated Neurological: He is alert.  B/l UE strength 4+/5 B/l LE: 4/5 HF,KE  LLE ADF 4-/5 (stable) RLE ADF 4/5 Skin:  Stage II ulcer left and right buttocks (not visualized) Scattered abrasions. Psychiatric: He has a normal mood and affect. His behavior is normal. STM deficits?  Assessment/Plan: 1. 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 Physiatrist is providing close team supervision and 24 hour management of active medical problems listed below. Physiatrist and rehab team continue to assess barriers to discharge/monitor patient progress toward functional and medical goals.  Function:  Bathing Bathing position Bathing activity did not occur: N/A Position: Wheelchair/chair at sink  Bathing parts Body parts bathed by patient: Right arm, Left arm, Chest, Abdomen, Left upper leg, Right upper leg, Right lower leg, Left lower leg Body parts bathed by helper: Buttocks, Front perineal area  Bathing assist Assist Level: Touching or steadying assistance(Pt > 75%)      Upper Body Dressing/Undressing Upper body dressing Upper body dressing/undressing activity did not occur: Refused What is the patient wearing?: Pull over shirt/dress     Pull over shirt/dress - Perfomed by patient: Thread/unthread right sleeve, Thread/unthread left sleeve, Put head through opening Pull over shirt/dress - Perfomed by helper: Pull shirt over trunk        Upper body assist Assist Level: Touching or steadying assistance(Pt > 75%)      Lower Body Dressing/Undressing Lower body dressing Lower body dressing/undressing activity did not occur: N/A What is the patient wearing?: Non-skid slipper socks, Pants     Pants- Performed by patient: Thread/unthread right pants leg, Thread/unthread left pants leg Pants- Performed by helper: Pull pants up/down   Non-skid slipper socks- Performed by helper: Don/doff right sock, Don/doff left sock   Socks - Performed by helper: Don/doff left sock, Don/doff right  sock           TED Hose - Performed by helper: Don/doff left TED hose, Don/doff right TED hose  Lower body assist Assist for lower body dressing: Touching or steadying assistance (Pt > 75%)      Toileting Toileting Toileting activity did not occur: Refused   Toileting steps completed by helper: Adjust  clothing prior to toileting, Performs perineal hygiene Toileting Assistive Devices: Grab bar or rail  Toileting assist Assist level: Touching or steadying assistance (Pt.75%)   Transfers Chair/bed transfer   Chair/bed transfer method: Ambulatory Chair/bed transfer assist level: Touching or steadying assistance (Pt > 75%) Chair/bed transfer assistive device: Walker, Armrests, Bedrails     Locomotion Ambulation     Max distance: 56 ft Assist level: Touching or steadying assistance (Pt > 75%)   Wheelchair   Type: Manual Max wheelchair distance: 175 ft Assist Level: Supervision or verbal cues  Cognition Comprehension Comprehension assist level: Understands basic 75 - 89% of the time/ requires cueing 10 - 24% of the time  Expression Expression assist level: Expresses complex 90% of the time/cues < 10% of the time  Social Interaction Social Interaction assist level: Interacts appropriately with others with medication or extra time (anti-anxiety, antidepressant).  Problem Solving Problem solving assist level: Solves complex 90% of the time/cues < 10% of the time  Memory Memory assist level: Recognizes or recalls 90% of the time/requires cueing < 10% of the time    Medical Problem List and Plan: 1.  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  DVT Prophylaxis/Anticoagulation: SCDs.   Vascular study neg for DVT on 1/11 3. Pain Management: Hydrocodone as needed  Robaxin scheduled 1/11, increased 1/12, changed to PRN on 1/18  Gabapentin 100 TID started 1/12, increased to 300 on 1/14, 400 on 1/17 due to CrCl, decreased  back to 100 TID on 1/18 due to potential interference with cognition  Kpad added 1/12  Lidoderm patch added 1/14  Right periscapular/rhomboid pain--?muscle spasms--encourage use of robaxin,heat, ice   -PT will work on ROM/massage 4.  Acute blood loss anemia. Continue iron supplement.   Hb 9.2 on 1/15, down to 8.5 on 1/19 cont to monitor  5. Neuropsych: This patient is capable of making decisions on his own behalf. 6. Skin/Wound Care: Bilateral buttocks and sacral ulcer. Follow-up WOC 7. Fluids/Electrolytes/Nutrition: Routine I&O 8. Hypertension.   Verapamil 240 mg daily, increased to 360 on 1/12, reduced back to 240 on 1/14--continue at current dosing although pressures have been variable  Vitals:   01/23/16 2153 01/24/16 0458  BP: (!) 154/77 (!) 88/65  Pulse:  78  Resp:  18  Temp:  98.1 F (36.7 C)   9. CRI stage III.   With AKI, Cr 1.75 on 1/15, 1.28 on 1/19  Resolved GFR normalized 10. Urinary retention.   Flomax started 1/11  -ucx positive yesterday for ecoli 50k and pseudomonas 100k---few symptoms at present   -will rx with 3 days of cipro only  11. Hypokalemia: Resolved   K+ 4.2 on 1/15  Cont to monitor 12. Constipation.   Pt incont of stool, liquid stool yesterday  Improved, senna d/ced on 1/18 13. Hyperglycemia, glucose 117 this am   14. Hypoalbuminemia  Supplement initiated 1/11 15. Leukocytosis: Resolved    16. Sleep disturbance  Trazodone PRN ordered  Improving  LOS (Days) 11 A FACE TO FACE EVALUATION WAS PERFORMED  Ericson Nafziger T  01/24/2016 8:28 AM

## 2016-01-24 NOTE — Progress Notes (Signed)
Physical Therapy Session Note  Patient Details  Name: Marvin Morrison MRN: [id number] Date of Birth: 1940-07-10  Today's Date: 01/24/2016 PT Individual Time: 0800-0845 PT Individual Time Calculation (min): 45 min   Short Term Goals: Week 2:  PT Short Term Goal 1 (Week 2): Patient will ambulate 50 ft using RW with min A consistently PT Short Term Goal 2 (Week 2): Patient will be able to transfer from bed to W/C using RW with Min A  PT Short Term Goal 3 (Week 2): Patient will be able to ascend and descend 2 steps using LRAD with Mod A consistently PT Short Term Goal 4 (Week 2): patient will demonstrate standing tolerance of 5 minutes using RW and min A  Skilled Therapeutic Interventions/Progress Updates:  Pt was found in bed receiving medication from nursing. Pt complained of R shoulder/back pain to nursing and therapy that continued throughout this session. Pt performed supine to sit transfer supervision with HOB down and with handrails.   Stand pivot and sit-to-stand transfers with W/C throughout therapy were min A lifting and lowering with max verbal cues for hand placement and sequence.   Pt self propelled in W/C 162 ft to rehab gym with supervision.  Pt ambulated 67 ft and 50 ft min assist with RW and verbal cues for sequencing, proper use of DME, and posture.   Patient was returned to room in W/C and left on commode with instructions to use call button to alert nursing when finished with toileting. Pt required total assist with removing briefs to sit on commode. Nursing was notified on patient's location and situation.      Therapy Documentation Precautions:  Precautions Precautions: Fall, Back Precaution Booklet Issued: No Restrictions Weight Bearing Restrictions: No Other Position/Activity Restrictions: Back precautions General:    See Function Navigator for Current Functional Status.   Therapy/Group: Individual Therapy  Marvin Morrison 01/24/2016, 8:32 AM

## 2016-01-25 ENCOUNTER — Inpatient Hospital Stay (HOSPITAL_COMMUNITY): Payer: Medicare Other | Admitting: Physical Therapy

## 2016-01-25 ENCOUNTER — Inpatient Hospital Stay (HOSPITAL_COMMUNITY): Payer: Medicare Other | Admitting: Occupational Therapy

## 2016-01-25 DIAGNOSIS — N390 Urinary tract infection, site not specified: Secondary | ICD-10-CM

## 2016-01-25 DIAGNOSIS — N182 Chronic kidney disease, stage 2 (mild): Secondary | ICD-10-CM

## 2016-01-25 MED ORDER — HYDROCODONE-ACETAMINOPHEN 5-325 MG PO TABS
1.0000 | ORAL_TABLET | Freq: Four times a day (QID) | ORAL | Status: DC | PRN
  Administered 2016-01-25 – 2016-02-06 (×43): 2 via ORAL
  Filled 2016-01-25 (×44): qty 2

## 2016-01-25 NOTE — Progress Notes (Signed)
Physical Therapy Session Note  Patient Details  Name: Marvin Morrison MRN: [id number] Date of Birth: [birth year]  Today's Date: 01/25/2016 PT Individual Time: [id number] PT Individual Time Calculation (min): 30 min   Short Term Goals: Week 2:  PT Short Term Goal 1 (Week 2): Patient will ambulate 50 ft using RW with min A consistently PT Short Term Goal 2 (Week 2): Patient will be able to transfer from bed to W/C using RW with Min A  PT Short Term Goal 3 (Week 2): Patient will be able to ascend and descend 2 steps using LRAD with Mod A consistently PT Short Term Goal 4 (Week 2): patient will demonstrate standing tolerance of 5 minutes using RW and min A  Skilled Therapeutic Interventions/Progress Updates:  Pt received in w/c noting 6/10 pain in B LE but RN reports pt is premedicated. Pt found to be incontinent of bowels and performed sit<>stand with  Min assist and cuing for hand placement. Pt tolerated standing ~4 minutes + 1 minute with min assist for balance and cuing for upright posture and BLE knee extension. RN provided total assist for peri hygiene. Transported pt to gym where he utilized cybex kinetron in sitting up to 50 cm/sec for BLE strengthening. At end of session pt left sitting in w/c in room with all needs within reach.      Therapy Documentation Precautions:  Precautions Precautions: Fall, Back Precaution Booklet Issued: No Restrictions Weight Bearing Restrictions: No Other Position/Activity Restrictions: Back precautions   See Function Navigator for Current Functional Status.   Therapy/Group: Individual Therapy  Waunita Schooner 01/25/2016, 4:41 PM

## 2016-01-25 NOTE — Progress Notes (Signed)
Physical Therapy Session Note  Patient Details  Name: Marvin Morrison MRN: [id number] Date of Birth: Jun 12, 1940  Today's Date: 01/25/2016 PT Individual Time: 1030-1128 PT Individual Time Calculation (min): 58 min   Short Term Goals: Week 2:  PT Short Term Goal 1 (Week 2): Patient will ambulate 50 ft using RW with min A consistently PT Short Term Goal 2 (Week 2): Patient will be able to transfer from bed to W/C using RW with Min A  PT Short Term Goal 3 (Week 2): Patient will be able to ascend and descend 2 steps using LRAD with Mod A consistently PT Short Term Goal 4 (Week 2): patient will demonstrate standing tolerance of 5 minutes using RW and min A  Skilled Therapeutic Interventions/Progress Updates:    Pt able to propel self in w/c to gym (150 ft) with supervision. Ambulation performed X3 going 35 ft X2, 66 ft X1 - each with rw. Pt with poor LE control, occasional LE buckling with independent recovery. Initially with scissoring pattern but improving during session. Cues for safety with hand placement and posture provided. NRE: standing balance and posture activities, to taps with 6 inch step and rw. LE activation with hip abduction/adduction, knee extension and flexion. Calf stretch bilaterally prior to ambulation. Pt in room with call light following session.   Therapy Documentation Precautions:  Precautions Precautions: Fall, Back Precaution Booklet Issued: No Restrictions Weight Bearing Restrictions: No Other Position/Activity Restrictions: Back precautions General:   Vital Signs:  Pain: Pt reports having low back pain during activity, monitor throughout session. Pt reports having pain medication prior to session and denied pain prior to activity.   See Function Navigator for Current Functional Status.   Therapy/Group: Individual Therapy  Linard Millers, PT, [place] 01/25/2016, 12:29 PM

## 2016-01-25 NOTE — Progress Notes (Signed)
Physical Therapy Session Note  Patient Details  Name: Marvin Morrison MRN: [id number] Date of Birth: April 10, 1940  Today's Date: 01/25/2016 PT Individual Time: 1400-1500 PT Individual Time Calculation (min): 60 min   Short Term Goals: Week 2:  PT Short Term Goal 1 (Week 2): Patient will ambulate 50 ft using RW with min A consistently PT Short Term Goal 2 (Week 2): Patient will be able to transfer from bed to W/C using RW with Min A  PT Short Term Goal 3 (Week 2): Patient will be able to ascend and descend 2 steps using LRAD with Mod A consistently PT Short Term Goal 4 (Week 2): patient will demonstrate standing tolerance of 5 minutes using RW and min A  Skilled Therapeutic Interventions/Progress Updates: Pt presented in bed.  Performed bed mobility with min guard with HOB elevated. Donned shoes for time management. Pt propelled to rehab gym with supervision. Performed sit to stand from w/c to ma x 4 with modA. Min cues for hand placement, mod cues for increased quad activation and improving posture. Pt with noted decreased eccentric control in stand to sit. Pt with x1 episode of "shooting" pain in RLE which resolved after rest. Pt educated in abdominal bracing and techniques for performing bracing with transitional movements. Pt propelled back to room with increased cues for RLE placement as would slide off leg rest. Pt left in room remaining in w/c with call bell within reach and all current needs met.      Therapy Documentation Precautions:  Precautions Precautions: Fall, Back Precaution Booklet Issued: No Restrictions Weight Bearing Restrictions: No Other Position/Activity Restrictions: Back precautions General:    See Function Navigator for Current Functional Status.   Therapy/Group: Individual Therapy  Christobal Morado. Alcario Tinkey, PTA  01/25/2016, 4:09 PM

## 2016-01-25 NOTE — Progress Notes (Signed)
[place] PHYSICAL MEDICINE & REHABILITATION     PROGRESS NOTE  Subjective/Complaints:  Pt seen laying in bed this AM.  He complains of an "air pocket" in his right chest wall that started yesterday, but is getting better.  He starts to state that his feet hurt, but then states that he remembers they stopped hurting 3 days ago.  When asked if he needs anything, he states that "just keep the pain pills coming on time".    ROS: +Right chest wall pain. Denise nausea, vomiting, diarrhea, shortness of breath or chest pain   Objective: Vital Signs: Blood pressure (!) 116/59, pulse 79, temperature 97.9 F (36.6 C), temperature source Oral, resp. rate 18, height 6' (1.829 m), weight 81.5 kg (179 lb 10.8 oz), SpO2 95 %. No results found. No results for input(s): WBC, HGB, HCT, PLT in the last 72 hours. No results for input(s): NA, K, CL, GLUCOSE, BUN, CREATININE, CALCIUM in the last 72 hours.  Invalid input(s): CO CBG (last 3)   Recent Labs  01/23/16 1145  GLUCAP 112*    Wt Readings from Last 3 Encounters:  01/20/16 81.5 kg (179 lb 10.8 oz)  01/11/16 75.7 kg (166 lb 14.2 oz)  12/08/15 76.2 kg (168 lb)    Physical Exam:  BP (!) 116/59 (BP Location: Right Arm)   Pulse 79   Temp 97.9 F (36.6 C) (Oral)   Resp 18   Ht 6' (1.829 m)   Wt 81.5 kg (179 lb 10.8 oz)   SpO2 95%   BMI 24.37 kg/m  Gen: NAD. Well-developed.  HENT: Normocephalic. Atraumatic. Eyes: EOM are normal. No discharge. Cardiovascular: RRR. without JVD. Respiratory: CTA bilaterally. Unlabored.  GI: Soft, BS+ Musculoskeletal: He exhibits no edema. Tenderness along right chest wall.  Neurological: He is alert.  B/l UE strength 4+/5 B/l LE: 4+/5 HF,KE  LLE ADF 4-/5  RLE ADF 4/5 Skin:  Stage II ulcer left and right buttocks (not examined) Scattered abrasions. Psychiatric: He has a normal mood and affect.   Assessment/Plan: 1. 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 Physiatrist is providing close team supervision and 24 hour management of active medical problems listed below. Physiatrist and rehab team continue to assess barriers to discharge/monitor patient progress toward functional and medical goals.  Function:  Bathing Bathing position Bathing activity did not occur: N/A Position: Wheelchair/chair at sink  Bathing parts Body parts bathed by patient: Right arm, Left arm, Chest, Abdomen, Left upper leg, Right upper leg, Right lower leg, Left lower leg Body parts bathed by helper: Buttocks, Front perineal area  Bathing assist Assist Level: Touching or steadying assistance(Pt > 75%)      Upper Body Dressing/Undressing Upper body dressing Upper body dressing/undressing activity did not occur: Refused What is the patient wearing?: Pull over shirt/dress     Pull over shirt/dress - Perfomed by patient: Thread/unthread right sleeve, Thread/unthread left sleeve, Put head through opening Pull over shirt/dress - Perfomed by helper: Pull shirt over trunk        Upper body assist Assist Level: Touching or steadying assistance(Pt > 75%)      Lower Body Dressing/Undressing Lower body dressing Lower body dressing/undressing activity did not occur: N/A What is the patient wearing?: Non-skid slipper socks, Pants     Pants- Performed by patient: Thread/unthread right pants leg, Thread/unthread left pants leg Pants- Performed by  helper: Pull pants up/down   Non-skid slipper socks- Performed by helper: Don/doff right sock, Don/doff left sock   Socks - Performed by helper: Don/doff left sock, Don/doff right sock           TED Hose - Performed by helper: Don/doff left TED hose, Don/doff right TED hose  Lower body assist Assist for lower body dressing: Touching or steadying  assistance (Pt > 75%)      Toileting Toileting Toileting activity did not occur: Refused   Toileting steps completed by helper: Adjust clothing prior to toileting, Performs perineal hygiene, Adjust clothing after toileting Toileting Assistive Devices: Grab bar or rail  Toileting assist Assist level: Two helpers   Transfers Chair/bed transfer   Chair/bed transfer method: Stand pivot Chair/bed transfer assist level: Maximal assist (Pt 25 - 49%/lift and lower) Chair/bed transfer assistive device: Walker, Air cabin crew     Max distance: 78 ft Assist level: Touching or steadying assistance (Pt > 75%)   Wheelchair   Type: Manual Max wheelchair distance: 162 ft Assist Level: Supervision or verbal cues  Cognition Comprehension Comprehension assist level: Understands basic 90% of the time/cues < 10% of the time  Expression Expression assist level: Expresses basic 90% of the time/requires cueing < 10% of the time.  Social Interaction Social Interaction assist level: Interacts appropriately 90% of the time - Needs monitoring or encouragement for participation or interaction.  Problem Solving Problem solving assist level: Solves basic problems with no assist  Memory Memory assist level: Recognizes or recalls 90% of the time/requires cueing < 10% of the time    Medical Problem List and Plan: 1.  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  DVT Prophylaxis/Anticoagulation: SCDs.   Vascular study neg for DVT on 1/11 3. Pain Management:   Hydrocodone changed to q6 PRN.  Robaxin scheduled 1/11, increased 1/12, changed to PRN on 1/18  Gabapentin 100 TID started 1/12, increased to 300 on 1/14, 400 on 1/17 due to CrCl, decreased  back to 100 TID on 1/18 due to potential interference with cognition  Kpad added 1/12  Lidoderm patch added  1/14 4. Acute blood loss anemia. Continue iron supplement.   Hb 8.5 on 1/19   Cont to monitor 5. Neuropsych: This patient is capable of making decisions on his own behalf. 6. Skin/Wound Care: Bilateral buttocks and sacral ulcer. Follow-up WOC 7. Fluids/Electrolytes/Nutrition: Routine I&O 8. Hypertension.   Verapamil 240 mg daily, increased to 360 on 1/12, reduced back to 240 on 1/14 9. CRI stage II.   With AKI, improving, Cr 1.28 on 1/19 10. Urinary retention.   Flomax started 1/11 11. Hypokalemia: Resolved   K+ 4.2 on 1/19  Cont to monitor 12. Constipation: Resolved.   Senna d/ced on 1/18 13. Hyperglycemia 14. Hypoalbuminemia  Supplement initiated 1/11 15. Leukocytosis: Resolved 16. Sleep disturbance  Trazodone PRN ordered  Improving 17. Acute lower UTI  ucx positive ecoli 50k and pseudomonas 100k  Cipro 1/21-1/24  LOS (Days) 12 A FACE TO FACE EVALUATION WAS PERFORMED  Lanise Mergen [name] 01/25/2016 8:39 AM

## 2016-01-25 NOTE — Progress Notes (Signed)
Occupational Therapy Session Note  Patient Details  Name: BRYCEN BEAN MRN: [id number] Date of Birth: 08/30/1940  Today's Date: 01/25/2016 OT Individual Time: 7017-7939 OT Individual Time Calculation (min): 60 min    Short Term Goals: Week 1:  OT Short Term Goal 1 (Week 1): Pt will complete LB dressing using ADL AE with Mod A OT Short Term Goal 1 - Progress (Week 1): Partly met OT Short Term Goal 2 (Week 1): Pt will complete toilet transfers with Mod A and RW OT Short Term Goal 3 (Week 1): Pt will complete sit<>stand with Mod A in preparation for ADL activities. OT Short Term Goal 3 - Progress (Week 1): Met Week 2:  OT Short Term Goal 1 (Week 2): Pt will complete LB dressing using ADL AE with Mod A OT Short Term Goal 2 (Week 2): Pt will complete toilet transfers with Mod A and RW OT Short Term Goal 3 (Week 2): Pt will tolerate 5 minutes of standing activities with Mod A for balance     Skilled Therapeutic Interventions/Progress Updates: Skilled OT session completed with focus on sit<stands, functional transfers, and AE training for back precaution adherence during self care completion. Pt was lying in bed at time of arrival, agreeable to complete bathing/dressing. Pt completed supine<sit with close supervision and HOB elevated. Mod A stand pivot transfer with RW. Pt placed w/c level at sink with sponge bathing setup. Pt used LH sponge and completed with overall Min A for back (excluding pericare due to RN just changing brief). Pt then completed dressing with education on use of reacher, sock aide, and red shoe horn. Pt demonstrated good carryover of education for reacher and sock aide but became frustrated with shoe horn. "I won't be doing this at home... My wife will help with my shoes." Pt provided education regarding benefits of completing ADLs at max level of independence and established Mod I goals from OT, with pt insisting that he will use only reacher at home for LB dressing. Will address  with primary OT and modify goals as necessary. Pt was then left in w/c with all needs within reach at time of departure.      Therapy Documentation Precautions:  Precautions Precautions: Fall, Back Precaution Booklet Issued: No Restrictions Weight Bearing Restrictions: No Other Position/Activity Restrictions: Back precautions  Pain: Pt reported pain to be manageable with provided rest breaks  Pain Assessment Pain Assessment: 0-10 Pain Score: 7  Pain Type: Acute pain Pain Location: Leg Pain Orientation: Right Pain Descriptors / Indicators: Aching Pain Frequency: Constant Pain Onset: On-going Pain Intervention(s): Medication (See eMAR) ADL: ADL ADL Comments: Please see functional navigator    See Function Navigator for Current Functional Status.   Therapy/Group: Individual Therapy  Maurianna Benard A Zyana Amaro 01/25/2016, 12:30 PM

## 2016-01-26 ENCOUNTER — Inpatient Hospital Stay (HOSPITAL_COMMUNITY): Payer: Medicare Other | Admitting: Physical Therapy

## 2016-01-26 ENCOUNTER — Inpatient Hospital Stay (HOSPITAL_COMMUNITY): Payer: Medicare Other | Admitting: Occupational Therapy

## 2016-01-26 NOTE — Progress Notes (Signed)
[place] PHYSICAL MEDICINE & REHABILITATION     PROGRESS NOTE  Subjective/Complaints:  Pt seen laying in bed this AM.  He states he has some "good news" for me, stating he now longer has right chest wall pain.  He slept well overnight.   ROS: Denise nausea, vomiting, diarrhea, shortness of breath or chest pain   Objective: Vital Signs: Blood pressure 117/67, pulse 82, temperature 98.6 F (37 C), temperature source Oral, resp. rate 17, height 6' (1.829 m), weight 81.5 kg (179 lb 10.8 oz), SpO2 97 %. No results found. No results for input(s): WBC, HGB, HCT, PLT in the last 72 hours. No results for input(s): NA, K, CL, GLUCOSE, BUN, CREATININE, CALCIUM in the last 72 hours.  Invalid input(s): CO CBG (last 3)   Recent Labs  01/23/16 1145  GLUCAP 112*    Wt Readings from Last 3 Encounters:  01/20/16 81.5 kg (179 lb 10.8 oz)  01/11/16 75.7 kg (166 lb 14.2 oz)  12/08/15 76.2 kg (168 lb)    Physical Exam:  BP 117/67 (BP Location: Right Arm)   Pulse 82   Temp 98.6 F (37 C) (Oral)   Resp 17   Ht 6' (1.829 m)   Wt 81.5 kg (179 lb 10.8 oz)   SpO2 97%   BMI 24.37 kg/m  Gen: NAD. Well-developed.  HENT: Normocephalic. Atraumatic. Eyes: EOM are normal. No discharge. Cardiovascular: RRR. without JVD. Respiratory: CTA bilaterally. Unlabored.  GI: Soft, BS+ Musculoskeletal: He exhibits no edema. Tenderness along right chest wall.  Neurological: He is alert.  B/l UE strength 4+/5 B/l LE: 4+/5 HF,KE  LLE ADF 4-/5 (stable) RLE ADF 4/5 Skin:  Stage II ulcer left and right buttocks (not examined) Scattered abrasions. Psychiatric: He has a normal mood and affect.   Assessment/Plan: 1. 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 Physiatrist is providing close team  supervision and 24 hour management of active medical problems listed below. Physiatrist and rehab team continue to assess barriers to discharge/monitor patient progress toward functional and medical goals.  Function:  Bathing Bathing position Bathing activity did not occur: N/A Position: Wheelchair/chair at sink  Bathing parts Body parts bathed by patient: Right arm, Left arm, Chest, Abdomen, Left upper leg, Right upper leg, Right lower leg, Left lower leg Body parts bathed by helper: Back  Bathing assist Assist Level: Touching or steadying assistance(Pt > 75%)      Upper Body Dressing/Undressing Upper body dressing Upper body dressing/undressing activity did not occur: Refused What is the patient wearing?: Pull over shirt/dress     Pull over shirt/dress - Perfomed by patient: Thread/unthread right sleeve, Thread/unthread left sleeve, Put head through opening, Pull shirt over trunk Pull over shirt/dress - Perfomed by helper: Pull shirt over trunk        Upper body assist Assist Level: Supervision or verbal cues      Lower Body Dressing/Undressing Lower body dressing Lower body dressing/undressing activity did not occur: N/A What is the patient wearing?: Pants, Non-skid slipper socks, Ted Hose, Shoes     Pants- Performed by patient: Thread/unthread right pants leg, Thread/unthread left pants leg (with reacher) Pants- Performed by helper: Pull pants up/down Non-skid slipper socks- Performed by patient: Don/doff right sock, Don/doff left sock (with sock aide) Non-skid slipper socks- Performed by helper: Don/doff right sock, Don/doff left sock   Socks - Performed by  helper: Don/doff left sock, Don/doff right sock   Shoes - Performed by helper: Don/doff right shoe, Don/doff left shoe, Fasten right, Fasten left       TED Hose - Performed by helper: Don/doff right TED hose, Don/doff left TED hose  Lower body assist Assist for lower body dressing: Touching or steadying assistance  (Pt > 75%)      Toileting Toileting Toileting activity did not occur: Refused Toileting steps completed by patient: Adjust clothing prior to toileting Toileting steps completed by helper: Adjust clothing prior to toileting, Performs perineal hygiene, Adjust clothing after toileting Toileting Assistive Devices: Grab bar or rail  Toileting assist Assist level: Two helpers   Transfers Chair/bed transfer   Chair/bed transfer method: Stand pivot Chair/bed transfer assist level: Moderate assist (Pt 50 - 74%/lift or lower) Chair/bed transfer assistive device: Walker, Air cabin crew     Max distance: 66 ft Assist level: Touching or steadying assistance (Pt > 75%)   Wheelchair   Type: Manual Max wheelchair distance: 150 ft Assist Level: Supervision or verbal cues  Cognition Comprehension Comprehension assist level: Follows complex conversation/direction with no assist  Expression Expression assist level: Expresses complex ideas: With no assist  Social Interaction Social Interaction assist level: Interacts appropriately with others - No medications needed.  Problem Solving Problem solving assist level: Solves complex problems: Recognizes & self-corrects  Memory Memory assist level: Complete Independence: No helper    Medical Problem List and Plan: 1.  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  DVT Prophylaxis/Anticoagulation: SCDs.   Vascular study neg for DVT on 1/11 3. Pain Management:   Hydrocodone changed to q6 PRN.  Robaxin scheduled 1/11, increased 1/12, changed to PRN on 1/18  Gabapentin 100 TID started 1/12, increased to 300 on 1/14, 400 on 1/17 due to CrCl, decreased  back to 100 TID on 1/18 due to potential interference with cognition  Kpad added 1/12  Lidoderm patch added 1/14 4. Acute blood loss anemia. Continue  iron supplement.   Hb 8.5 on 1/19   Cont to monitor 5. Neuropsych: This patient is capable of making decisions on his own behalf. 6. Skin/Wound Care: Bilateral buttocks and sacral ulcer. Follow-up WOC 7. Fluids/Electrolytes/Nutrition: Routine I&O 8. Hypertension.   Verapamil 240 mg daily, increased to 360 on 1/12, reduced back to 240 on 1/14 9. CRI stage II.   With AKI, improving, Cr 1.28 on 1/19 10. Urinary retention.   Flomax started 1/11 11. Hypokalemia: Resolved   K+ 4.2 on 1/19  Cont to monitor 12. Constipation: Resolved.   Senna d/ced on 1/18 13. Hyperglycemia  Relatively controlled 1/23 14. Hypoalbuminemia  Supplement initiated 1/11 15. Leukocytosis: Resolved 16. Sleep disturbance  Trazodone PRN ordered  Improving 17. Acute lower UTI  ucx positive ecoli 50k and pseudomonas 100k  Cipro 1/21-1/24  Dysuria improving  LOS (Days) 13 A FACE TO FACE EVALUATION WAS PERFORMED  Pierre Cumpton [name] 01/26/2016 8:36 AM

## 2016-01-26 NOTE — Progress Notes (Signed)
Occupational Therapy Session Note  Patient Details  Name: Marvin Morrison MRN: [id number] Date of Birth: 1940/05/22  Today's Date: 01/26/2016 OT Individual Time: 9485-4627 OT Individual Time Calculation (min): 8 min    Short Term Goals: Week 1:  OT Short Term Goal 1 (Week 1): Pt will complete LB dressing using ADL AE with Mod A OT Short Term Goal 1 - Progress (Week 1): Partly met OT Short Term Goal 2 (Week 1): Pt will complete toilet transfers with Mod A and RW OT Short Term Goal 3 (Week 1): Pt will complete sit<>stand with Mod A in preparation for ADL activities. OT Short Term Goal 3 - Progress (Week 1): Met Week 2:  OT Short Term Goal 1 (Week 2): Pt will complete LB dressing using ADL AE with Mod A OT Short Term Goal 2 (Week 2): Pt will complete toilet transfers with Mod A and RW OT Short Term Goal 3 (Week 2): Pt will tolerate 5 minutes of standing activities with Mod A for balance  Skilled Therapeutic Interventions/Progress Updates: Patient stated he was unable to participate in session due to "My wife drove all the way down here.   This is the only time we have, and we need to discuss finances."     He did participate minimally in education of bed level exercises and then stated, "Well, my finances here are therapy, and I am going to get back to it."  He wife tried to talk him into participating a little longer, but he contested.  Patient request was granted and he was left in the supportive care of his wife as they continued working on their finances.     Therapy Documentation Precautions:  Precautions Precautions: Fall, Back Precaution Booklet Issued: No Restrictions Weight Bearing Restrictions: No Other Position/Activity Restrictions: Back precautions General: General OT Amount of Missed Time: 57 Minutes (48) Pain:denied   See Function Navigator for Current Functional Status.   Therapy/Group: Individual Therapy  Alfredia Ferguson [facility] 01/26/2016, 7:00 PM

## 2016-01-26 NOTE — Progress Notes (Signed)
Physical Therapy Session Note  Patient Details  Name: Marvin Morrison MRN: [id number] Date of Birth: 12-Apr-1940  Today's Date: 01/26/2016 PT Individual Time: 0800-0900 PT Individual Time Calculation (min): 60 min   Short Term Goals: Week 2:  PT Short Term Goal 1 (Week 2): Patient will ambulate 50 ft using RW with min A consistently PT Short Term Goal 2 (Week 2): Patient will be able to transfer from bed to W/C using RW with Min A  PT Short Term Goal 3 (Week 2): Patient will be able to ascend and descend 2 steps using LRAD with Mod A consistently PT Short Term Goal 4 (Week 2): patient will demonstrate standing tolerance of 5 minutes using RW and min A  Skilled Therapeutic Interventions/Progress Updates: Pt presented in bed having completed breakfast. Performed supine to sit with HOB elevated and use of bed rail. Performed with supervision with additional time required. Pt with c/o constant soreness in legs, nsg premedicated. Performed squat pivot transfer to w/c with minA, cues for hand placement. ModA threading pants for time management. Pt propelled self to rehab gym with supervision. Attempted NuStep L2 x 3 min, pt stating 13 on BORG exertion scale. After NuStep pt with c/o tightness on R side of chest and difficulty breathing. VSS 110/66, HR 80, SpO2 97%. Pt educated on PLB and relaxation techniques. Pt returned to room and returned to bed via same technique as prior. Pt left in bed with wound care nurse present and all current needs met.      Therapy Documentation Precautions:  Precautions Precautions: Fall, Back Precaution Booklet Issued: No Restrictions Weight Bearing Restrictions: (P) No Other Position/Activity Restrictions: Back precautions General:     See Function Navigator for Current Functional Status.   Therapy/Group: Individual Therapy  Renny Remer  Jerrald Doverspike, PTA  01/26/2016, 3:58 PM

## 2016-01-26 NOTE — Consult Note (Addendum)
WOC follow-up: Pt has been having frequent diarrhea and it is difficult to keep his wounds from becoming soiled, related to the close proximity on bilat buttocks to the rectum; can impair healing to the location. Unstageable pressure injuries greatly improved since previous assessment and evolved into stage 3 pressure injuries, decreasing in size. Left buttock 3X1X.1cmcm, Right buttock 2X.5X.1cm.  Both are 100% red and moist, small amt yellow drainage, no odor. Discontinue Santyl and protect with foam dressing. Discussed plan of care with patient and he verbalized understanding. Please re-consult if further assistance is needed.  Thank-you,  Julien Girt MSN, [place], [place], [place], [place]

## 2016-01-26 NOTE — Progress Notes (Signed)
Physical Therapy Session Note  Patient Details  Name: Marvin Morrison MRN: [id number] Date of Birth: 14-Jan-1940  Today's Date: 01/26/2016 PT Individual Time: 1003-1100 and 14:15-14:45 PT Individual Time Calculation (min): 57 min and 30 min  Short Term Goals: Week 2:  PT Short Term Goal 1 (Week 2): Patient will ambulate 50 ft using RW with min A consistently PT Short Term Goal 2 (Week 2): Patient will be able to transfer from bed to W/C using RW with Min A  PT Short Term Goal 3 (Week 2): Patient will be able to ascend and descend 2 steps using LRAD with Mod A consistently PT Short Term Goal 4 (Week 2): patient will demonstrate standing tolerance of 5 minutes using RW and min A  Skilled Therapeutic Interventions/Progress Updates:  Session 1:  Pt was found in bed after an incontinent bowel movement. Pt performed rolling with min A for total assist hygiene and clothing management. Pt was complaining of 8/10 pain in right anterior thigh and was given pain medication by nursing before leaving room.  Supine to sit required supervision with the use of handrails and min verbal cues for handplacement.   Pt self-propelled in W/C 165 ft down to rehab gym in [place] supervision.   Pt performed 6 stand pivot transfers with RW. Pt required min A with lowering to sit down only and mod verbal cues for foot placement, anterior weight shift, and proper use of walker with pivoting.  Pt had 1 episode of sudden sharp neuropathic pain in BLE that dissipated with rest. Pt also complained of anterior upper right chest pain that was brief and lasted for the duration of the sharp leg pain.  Pt ambulated 43 ft and 30 ft with CGA- min A with verbal cues for posture.   Pt was left in W/C with all needs within reach in room.    Session 2:   Pt was found in bed. Pt was complaining of 7/10 neuropathic pain in R anterior thigh.   Pt transferred from supine to sitting supervision with handrails and HOB slightly elevated.    Stand pivot transfers into W/C and throughout therapy were min A with lowering only with verbal cues for proper use of DME, handplacement, and posture.   Patient ascended and descended eight 3 inch steps Min A with verbal cues for handplacement, posture and sequence. Pt ascended and descended four 6 inch steps Mod A with verbal cues for sequence, handplacement, and posture.   One episode of acute sharp neuropathic pain with BLE occurred after stairs that disappeared with rest  Patient ambulated 40 ft Min A with RW and verbal cues for posture, proper use of DME, and sequence.   Pt required frequent rest breaks throughout therapy.   Pt self-propelled in W/C with supervision 100 ft to room. Pt was left in W/C with all needs within reach.   Therapy Documentation Precautions:  Precautions Precautions: Fall, Back Precaution Booklet Issued: No Restrictions Weight Bearing Restrictions: No Other Position/Activity Restrictions: Back precautions :     See Function Navigator for Current Functional Status.   Therapy/Group: Individual Therapy  Rosendo Gros 01/26/2016, 10:32 AM

## 2016-01-27 ENCOUNTER — Inpatient Hospital Stay (HOSPITAL_COMMUNITY): Payer: Medicare Other | Admitting: Physical Therapy

## 2016-01-27 ENCOUNTER — Inpatient Hospital Stay (HOSPITAL_COMMUNITY): Payer: Medicare Other | Admitting: Occupational Therapy

## 2016-01-27 ENCOUNTER — Inpatient Hospital Stay (HOSPITAL_COMMUNITY): Payer: Medicare Other

## 2016-01-27 MED ORDER — CALCIUM POLYCARBOPHIL 625 MG PO TABS
625.0000 mg | ORAL_TABLET | Freq: Two times a day (BID) | ORAL | Status: DC
  Administered 2016-01-27 – 2016-02-04 (×16): 625 mg via ORAL
  Filled 2016-01-27 (×16): qty 1

## 2016-01-27 MED ORDER — CALCIUM POLYCARBOPHIL 625 MG PO TABS: 1250.0000 mg | ORAL_TABLET | Freq: Every day | ORAL | Status: DC

## 2016-01-27 NOTE — Progress Notes (Signed)
Occupational Therapy Session Note  Patient Details  Name: Marvin Morrison MRN: [id number] Date of Birth: 1940-05-29  Today's Date: 01/27/2016  Session 1 OT Individual Time: 0900-1000 OT Individual Time Calculation (min): 60 min   Session 2 OT Individual Time: 1300-1400 OT Individual Time Calculation (min): 60 min    Short Term Goals: Week 2:  OT Short Term Goal 1 (Week 2): Pt will complete LB dressing using ADL AE with Mod A OT Short Term Goal 2 (Week 2): Pt will complete toilet transfers with Mod A and RW OT Short Term Goal 3 (Week 2): Pt will tolerate 5 minutes of standing activities with Mod A for balance  Skilled Therapeutic Interventions/Progress Updates:  Session 1    1:1 OT session focused on increased activity tolerance, standing endurance during ADLs, and modified bathing/dressng. Stand pivot transfers from bed>wc>raise toilet over commode- overall Mod A. Pt with clean brief. Pt unsuccessful with BM but smear on toilet paper.  Instructed pt on modified strategy for toileting without breaking back precautions, with encouragement, pt agreeable to attempt toileting in sitting. Pt was successful, but required assist from OT for thoroughness and dressing change for wound.  Discussed timed toileting for bowel and bladder and pt agreeable to start removing condom catheter during the daytime and using urinal beginning tomorrow. Addressed LB dressing techniques with improved standing balance to remove unilateral UE from RW and assist with pulling pants over hips. Pt left seated in wc at end of session-hand-off to Pt.  Session 2 1:1 OT session focused on transfer training, increased independence with toileting, improved sit<>stands, activity tolerance, and dynamic standing balance. Pt greeted in bed reporting he thought he had been incontinent of bowl. Pt agreeable to transfer onto toilet to continue Bm. Stand-pivot w/ Mod A w/c>toilet using grab bars. Successful BM and pt able to recall  toileting technique from previous session, Max A for thoroughness and clothing management. Pt then brought to therapy gym and addressing standing balance/endurance with card activity. Pt maintained standing for 3 minutes at longest standing bout with Mod/Max A for balance w/ posterior lean. Pt returned to room at end of session and left with needs met.  Therapy Documentation Precautions:  Precautions Precautions: Fall, Back Precaution Booklet Issued: No Restrictions Weight Bearing Restrictions: No Other Position/Activity Restrictions: Back precautions Pain: Pain Assessment Pain Assessment: 0-10 Pain Score: 4  Pain Type: Acute pain Pain Location: Leg Pain Orientation: Right Pain Descriptors / Indicators: Shooting Pain Onset: On-going Pain Intervention(s): Repositioned ADL: ADL ADL Comments: Please see functional navigator  See Function Navigator for Current Functional Status.   Therapy/Group: Individual Therapy  Valma Cava 01/27/2016, 3:54 PM

## 2016-01-27 NOTE — Plan of Care (Signed)
Problem: RH Balance Goal: LTG Patient will maintain dynamic standing with ADLs (OT) LTG:  Patient will maintain dynamic standing balance with assist during activities of daily living (OT)   Goal downgraded 1/24-ESD  Problem: RH Bathing Goal: LTG Patient will bathe with assist, cues/equipment (OT) LTG: Patient will bathe specified number of body parts with assist with/without cues using equipment (position)  (OT)  Goal downgraded 1/24-ESD  Problem: RH Dressing Goal: LTG Patient will perform lower body dressing w/assist (OT) LTG: Patient will perform lower body dressing with assist, with/without cues in positioning using equipment (OT)  Goal downgraded 1/24-ESD  Problem: RH Toilet Transfers Goal: LTG Patient will perform toilet transfers w/assist (OT) LTG: Patient will perform toilet transfers with assist, with/without cues using equipment (OT)  Goal downgraded 1/24-ESD  Comments: Goal downgraded 1/24-ESD

## 2016-01-27 NOTE — Progress Notes (Signed)
Physical Therapy Session Note  Patient Details  Name: Marvin Morrison MRN: [id number] Date of Birth: 13-Jun-1940  Today's Date: 01/27/2016 PT Individual Time: 1001-1101 PT Individual Time Calculation (min): 60 min   Short Term Goals: Week 2:  PT Short Term Goal 1 (Week 2): Patient will ambulate 50 ft using RW with min A consistently PT Short Term Goal 2 (Week 2): Patient will be able to transfer from bed to W/C using RW with Min A  PT Short Term Goal 3 (Week 2): Patient will be able to ascend and descend 2 steps using LRAD with Mod A consistently PT Short Term Goal 4 (Week 2): patient will demonstrate standing tolerance of 5 minutes using RW and min A  Skilled Therapeutic Interventions/Progress Updates:  Pt was in W/C upon arrival. Pt was complaining of 7/10 pain on anterior right thigh throughout therapy.  Pt self-propelled 165 ft to gym with supervision. All stand pivot and ambulatory transfers required a RW and were CGA with standing up but min A required to lower into chair or mat. Mod verbal cues were needed for handplacement and use of DME   Pt performed 10 min on Nustep at level 3 using only BLE for NMR.  Pt performed 6 sit-to-stand transfers with chair in front of pt as a target to educate and facilitate anterior shifting of weight for transfers.   Pt ambulated 90 ft and 60 ft with RW. Initially was CGA that progressed to min A as pt fatigued. One episode of knee buckling was noticed in the 60 ft walk.   Pt was pushed back into room in W/C and transferred into bed to rest. Pt was supervision with sitting to lying. Pt was left in bed with all needs met and within reach.     Therapy Documentation Precautions:  Precautions Precautions: Fall, Back Precaution Booklet Issued: No Restrictions Weight Bearing Restrictions: No Other Position/Activity Restrictions: Back precautions   See Function Navigator for Current Functional Status.   Therapy/Group: Individual  Therapy  Rosendo Gros 01/27/2016, 7:46 AM

## 2016-01-27 NOTE — Progress Notes (Signed)
[facility] PHYSICAL MEDICINE & REHABILITATION     PROGRESS NOTE  Subjective/Complaints:  Pt seen laying in bed this AM.  He slept well overnight.  He states that the pain regimen is working for him.  He denies complaints.   ROS: Denies nausea, vomiting, diarrhea, shortness of breath or chest pain   Objective: Vital Signs: Blood pressure (!) 106/59, pulse 74, temperature 97.6 F (36.4 C), temperature source Oral, resp. rate 16, height 6' (1.829 m), weight 81.5 kg (179 lb 10.8 oz), SpO2 96 %. No results found. No results for input(s): WBC, HGB, HCT, PLT in the last 72 hours. No results for input(s): NA, K, CL, GLUCOSE, BUN, CREATININE, CALCIUM in the last 72 hours.  Invalid input(s): CO CBG (last 3)  No results for input(s): GLUCAP in the last 72 hours.  Wt Readings from Last 3 Encounters:  01/20/16 81.5 kg (179 lb 10.8 oz)  01/11/16 75.7 kg (166 lb 14.2 oz)  12/08/15 76.2 kg (168 lb)    Physical Exam:  BP (!) 106/59 (BP Location: Right Arm)   Pulse 74   Temp 97.6 F (36.4 C) (Oral)   Resp 16   Ht 6' (1.829 m)   Wt 81.5 kg (179 lb 10.8 oz)   SpO2 96%   BMI 24.37 kg/m  Gen: NAD. Well-developed.  HENT: Normocephalic. Atraumatic. Eyes: EOM are normal. No discharge. Cardiovascular: RRR. No JVD. Respiratory: CTA bilaterally. Unlabored.  GI: Soft, BS+ Musculoskeletal: He exhibits no edema. Tenderness along right chest wall.  Neurological: He is alert.  B/l UE strength 4+/5 B/l LE: 4+/5 HF,KE  LLE ADF 4-/5 (unchanged) RLE ADF 4/5 Skin:  Stage II ulcer left and right buttocks (not examined) Scattered abrasions. Psychiatric: He has a normal mood and affect.   Assessment/Plan: 1. 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 Physiatrist is providing close team  supervision and 24 hour management of active medical problems listed below. Physiatrist and rehab team continue to assess barriers to discharge/monitor patient progress toward functional and medical goals.  Function:  Bathing Bathing position Bathing activity did not occur: N/A Position: Wheelchair/chair at sink  Bathing parts Body parts bathed by patient: Right arm, Left arm, Chest, Abdomen, Left upper leg, Right upper leg, Right lower leg, Left lower leg Body parts bathed by helper: Back  Bathing assist Assist Level: Touching or steadying assistance(Pt > 75%)      Upper Body Dressing/Undressing Upper body dressing Upper body dressing/undressing activity did not occur: Refused What is the patient wearing?: Pull over shirt/dress     Pull over shirt/dress - Perfomed by patient: Thread/unthread right sleeve, Thread/unthread left sleeve, Put head through opening, Pull shirt over trunk Pull over shirt/dress - Perfomed by helper: Pull shirt over trunk        Upper body assist Assist Level: Supervision or verbal cues      Lower Body Dressing/Undressing Lower body dressing Lower body dressing/undressing activity did not occur: N/A What is the patient wearing?: Pants, Non-skid slipper socks, Ted Hose, Shoes     Pants- Performed by patient: Thread/unthread right pants leg, Thread/unthread left pants leg (with reacher) Pants- Performed by helper: Pull pants up/down Non-skid slipper socks- Performed by patient: Don/doff right sock, Don/doff left sock (with sock aide) Non-skid slipper socks- Performed by helper: Don/doff right sock, Don/doff left sock   Socks - Performed by helper: Don/doff left  sock, Don/doff right sock   Shoes - Performed by helper: Don/doff right shoe, Don/doff left shoe, Fasten right, Fasten left       TED Hose - Performed by helper: Don/doff right TED hose, Don/doff left TED hose  Lower body assist Assist for lower body dressing: Touching or steadying assistance  (Pt > 75%)      Toileting Toileting Toileting activity did not occur: Refused Toileting steps completed by patient: Adjust clothing prior to toileting Toileting steps completed by helper: Adjust clothing prior to toileting, Performs perineal hygiene, Adjust clothing after toileting Toileting Assistive Devices: Grab bar or rail  Toileting assist Assist level: Two helpers   Transfers Chair/bed transfer   Chair/bed transfer method: Stand pivot Chair/bed transfer assist level: Moderate assist (Pt 50 - 74%/lift or lower) Chair/bed transfer assistive device: Walker, Air cabin crew     Max distance: 62 ft Assist level: Touching or steadying assistance (Pt > 75%)   Wheelchair   Type: Manual Max wheelchair distance: 165 ft Assist Level: Supervision or verbal cues  Cognition Comprehension Comprehension assist level: Understands basic 90% of the time/cues < 10% of the time  Expression Expression assist level: Expresses basic 90% of the time/requires cueing < 10% of the time.  Social Interaction Social Interaction assist level: Interacts appropriately with others - No medications needed.  Problem Solving Problem solving assist level: Solves basic 90% of the time/requires cueing < 10% of the time  Memory Memory assist level: Recognizes or recalls 75 - 89% of the time/requires cueing 10 - 24% of the time    Medical Problem List and Plan: 1.  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  DVT Prophylaxis/Anticoagulation: SCDs.   Vascular study neg for DVT on 1/11 3. Pain Management:   Hydrocodone changed to q6 PRN.  Robaxin scheduled 1/11, increased 1/12, changed to PRN on 1/18  Gabapentin 100 TID started 1/12, increased to 300 on 1/14, 400 on 1/17 due to CrCl, decreased  back to 100 TID on 1/18 due to potential interference with  cognition  Kpad added 1/12  Lidoderm patch added 1/14 4. Acute blood loss anemia. Continue iron supplement.   Hb 8.5 on 1/19   Cont to monitor  Labs ordered for tomorrow 5. Neuropsych: This patient is capable of making decisions on his own behalf. 6. Skin/Wound Care: Bilateral buttocks and sacral ulcer. Follow-up WOC 7. Fluids/Electrolytes/Nutrition: Routine I&O 8. Hypertension.   Verapamil 240 mg daily, increased to 360 on 1/12, reduced back to 240 on 1/14  Controlled 1/24 9. CRI stage II.   With AKI, improving, Cr 1.28 on 1/19  Labs ordered for tomorrow 10. Urinary retention.   Flomax started 1/11 11. Hypokalemia: Resolved   K+ 4.2 on 1/19  Cont to monitor 12. Constipation: Resolved.   Senna d/ced on 1/18 13. Hyperglycemia  Relatively controlled 1/24 14. Hypoalbuminemia  Supplement initiated 1/11 15. Leukocytosis: Resolved 16. Sleep disturbance  Trazodone PRN ordered  Improving 17. Acute lower UTI  ucx positive ecoli 50k and pseudomonas 100k  Cipro 1/21-1/24  Dysuria improving  LOS (Days) 14 A FACE TO FACE EVALUATION WAS PERFORMED  Tiena Manansala [name] 01/27/2016 8:33 AM

## 2016-01-27 NOTE — Progress Notes (Signed)
Physical Therapy Session Note  Patient Details  Name: Marvin Morrison MRN: [id number] Date of Birth: 12-11-1940  Today's Date: 01/27/2016 PT Individual Time: [id number] PT Individual Time Calculation (min): 25 min   Short Term Goals: Week 2:  PT Short Term Goal 1 (Week 2): Patient will ambulate 50 ft using RW with min A consistently PT Short Term Goal 2 (Week 2): Patient will be able to transfer from bed to W/C using RW with Min A  PT Short Term Goal 3 (Week 2): Patient will be able to ascend and descend 2 steps using LRAD with Mod A consistently PT Short Term Goal 4 (Week 2): patient will demonstrate standing tolerance of 5 minutes using RW and min A  Skilled Therapeutic Interventions/Progress Updates:  Pt was in W/C upon arrival to room. Pt was complaining of 7/10 pain in anterior right thigh throughout therapy that was addressed with frequent rest breaks and applying K-pad at end of treatment.   Pt self-propelled in W/C 165 ft down to rehab gym with supervision.   Pt ascended forward and descended forward 4 six inch steps twice with rest break between attempts. Pt required min A , bilateral UE support with rails, and verbal cues for sequence and handplacement.   Pt required min A with lowering and mod cues with handplacement and sequence with stand pivot transfers between stairs and W/C.   Pt performed stand pivot transfer to sit EOB. Pt attempted to perform task without walker but required supervision and use of bed rails.   Sitting to lying in bed required supervision with EOB elevated and use of bed rails.   Pt was left in bed with all needs within reach.     Therapy Documentation Precautions:  Precautions Precautions: Fall, Back Precaution Booklet Issued: No Restrictions Weight Bearing Restrictions: No Other Position/Activity Restrictions: Back precautions   See Function Navigator for Current Functional Status.   Therapy/Group: Individual Therapy  Rosendo Gros 01/27/2016, 4:41 PM

## 2016-01-27 NOTE — Plan of Care (Signed)
Problem: RH BLADDER ELIMINATION Goal: RH STG MANAGE BLADDER WITH ASSISTANCE STG Manage Bladder With Assistance-Mod A     Outcome: Not Progressing Wearing condom cath. Total assist needed.

## 2016-01-27 NOTE — Progress Notes (Signed)
Social Work Patient ID: Marvin Morrison, male   DOB: [birth year], 76 y.o.   MRN: [id number]  Spoke with wife via telephone after PT discussed pt wants to go home on Sat due to son in-law will be here and can  Carry him up the stairs at home. Contacted wife to discuss how much care could she provide and if in fact she could take a leave from work or not work, since pt will require min assist level at discharge  From rehab. Wife feels Sat discharge is too soon. She reports he was not functioning well at home and would not want him to come home too soon and be at that same level. She is unsure if she can Do min assist level. Will have her come in and observe pt in therapies, she can come in Friday @ 1:30 pm since she missed so much work she is making it up now. So will come in Friday and then again prior to Him being discharged to do hands on care. She will talk with pt tonight when here also about his thoughts.

## 2016-01-28 ENCOUNTER — Inpatient Hospital Stay (HOSPITAL_COMMUNITY): Payer: Medicare Other | Admitting: Physical Therapy

## 2016-01-28 ENCOUNTER — Inpatient Hospital Stay (HOSPITAL_COMMUNITY): Payer: Medicare Other | Admitting: Occupational Therapy

## 2016-01-28 DIAGNOSIS — E875 Hyperkalemia: Secondary | ICD-10-CM

## 2016-01-28 DIAGNOSIS — R195 Other fecal abnormalities: Secondary | ICD-10-CM

## 2016-01-28 LAB — BASIC METABOLIC PANEL
ANION GAP: 7 (ref 5–15)
BUN: 23 mg/dL — AB (ref 6–20)
CALCIUM: 8.7 mg/dL — AB (ref 8.9–10.3)
CO2: 27 mmol/L (ref 22–32)
Chloride: 104 mmol/L (ref 101–111)
Creatinine, Ser: 1.56 mg/dL — ABNORMAL HIGH (ref 0.61–1.24)
GFR calc Af Amer: 48 mL/min — ABNORMAL LOW (ref >60)
GFR, EST NON AFRICAN AMERICAN: 41 mL/min — AB (ref >60)
GLUCOSE: 102 mg/dL — AB (ref 65–99)
Potassium: 5.5 mmol/L — ABNORMAL HIGH (ref 3.5–5.1)
Sodium: 138 mmol/L (ref 135–145)

## 2016-01-28 LAB — URINALYSIS, ROUTINE W REFLEX MICROSCOPIC
BILIRUBIN URINE: NEGATIVE
GLUCOSE, UA: NEGATIVE mg/dL
Hgb urine dipstick: NEGATIVE
Ketones, ur: NEGATIVE mg/dL
LEUKOCYTES UA: NEGATIVE
NITRITE: NEGATIVE
PH: 7 (ref 5.0–8.0)
Protein, ur: NEGATIVE mg/dL
Specific Gravity, Urine: 1.006 (ref 1.005–1.030)

## 2016-01-28 LAB — CBC WITH DIFFERENTIAL/PLATELET
BASOS ABS: 0.1 10*3/uL (ref 0.0–0.1)
BASOS PCT: 1 %
EOS PCT: 9 %
Eosinophils Absolute: 0.7 10*3/uL (ref 0.0–0.7)
HCT: 27.5 % — ABNORMAL LOW (ref 39.0–52.0)
Hemoglobin: 8.7 g/dL — ABNORMAL LOW (ref 13.0–17.0)
Lymphocytes Relative: 27 %
Lymphs Abs: 2 10*3/uL (ref 0.7–4.0)
MCH: 30.4 pg (ref 26.0–34.0)
MCHC: 31.6 g/dL (ref 30.0–36.0)
MCV: 96.2 fL (ref 78.0–100.0)
MONO ABS: 0.9 10*3/uL (ref 0.1–1.0)
Monocytes Relative: 11 %
Neutro Abs: 4 10*3/uL (ref 1.7–7.7)
Neutrophils Relative %: 52 %
PLATELETS: 383 10*3/uL (ref 150–400)
RBC: 2.86 MIL/uL — ABNORMAL LOW (ref 4.22–5.81)
RDW: 14.6 % (ref 11.5–15.5)
WBC: 7.7 10*3/uL (ref 4.0–10.5)

## 2016-01-28 NOTE — Progress Notes (Signed)
Occupational Therapy Session Note  Patient Details  Name: Marvin Morrison MRN: [id number] Date of Birth: [birth year]  Today's Date: 01/28/2016 OT Individual Time: 1330-1030 OT Individual Time Calculation (min): 1260 min    Short Term Goals: Week 2:  OT Short Term Goal 1 (Week 2): Pt will complete LB dressing using ADL AE with Mod A OT Short Term Goal 2 (Week 2): Pt will complete toilet transfers with Mod A and RW OT Short Term Goal 3 (Week 2): Pt will tolerate 5 minutes of standing activities with Mod A for balance  Skilled Therapeutic Interventions/Progress Updates: Patient participated in skilled OT as follows today:  Bed mobility for staff to change brief = supervision  Supine with HOB slightly elevated to Edge of Bed transfer= supervision  EOB to w/c transfer= Supervison for stand pivot  UB bathing =setup  LB bathing= patient did not want to complete today  Grooming=supervision  W/c to bed transfer= Supervsion stand pivot  Bed mobility for positioning once back in bed = supervision  Patient spoke with nurse during session and c/o burning and pain during and after urination and "every 20-30 minutes" urge  Patient left with call bell in place upon this clinician exiting room     Therapy Documentation Precautions:  Precautions Precautions: Fall, Back Precaution Booklet Issued: No Restrictions Weight Bearing Restrictions: No Other Position/Activity Restrictions: Back precautions   Pain:denied  See Function Navigator for Current Functional Status.   Therapy/Group: Individual Therapy  Alfredia Ferguson [facility] 01/28/2016, 1:01 PM

## 2016-01-28 NOTE — Progress Notes (Signed)
Social Work [name], LCSW Social Worker Signed   Patient Care Conference Date of Service: 01/28/2016  9:16 AM      Hide copied text Hover for attribution information Inpatient RehabilitationTeam Conference and Plan of Care Update Date: 01/27/2016   Time: 2:15 PM      Patient Name: Marvin Morrison      Medical Record Number: [id number]  Date of Birth: [birth year] Sex: Male         Room/Bed: 4M06C/4M06C-01 Payor Info: Payor: MEDICARE / Plan: MEDICARE PART A AND B / Product Type: *No Product type* /     Admitting Diagnosis: Debility  Admit Date/Time:  01/13/2016  5:19 PM Admission Comments: No comment available    Primary Diagnosis:  <principal problem not specified> Principal Problem: <principal problem not specified>       Patient Active Problem List    Diagnosis Date Noted  . Acute lower UTI    . Stage 2 chronic kidney disease    . AKI (acute kidney injury) ([place])    . Constipation    . Sleep disturbance    . Labile blood pressure    . Neuropathic pain    . Benign essential HTN    . Dysuria    . Hypoalbuminemia due to protein-calorie malnutrition ([place])    . Lymphocytosis    . Hyperglycemia    . Radiculopathy 01/13/2016  . Surgery, elective    . Post-operative pain    . Acute blood loss anemia    . Stage 3 chronic kidney disease    . Urinary retention    . Hypokalemia    . Constipation due to pain medication    . Pressure injury of skin 01/12/2016  . Malnutrition of moderate degree 01/12/2016  . Lumbar stenosis 01/11/2016  . Gait disorder 11/18/2015  . Spinal stenosis of lumbar region 10/28/2015  . Nausea with vomiting 04/19/2014  . Loss of weight 05/18/2012  . Well adult exam 05/13/2011  . 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 CRI (chronic renal insufficiency) 07/13/2010  . Anemia due to other cause 04/06/2010  . Anemia, iron deficiency 04/06/2010  . DIARRHEA, CHRONIC 03/27/2009  . 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 Hyperlipidemia 02/01/2008  .  ABNORMAL GLUCOSE NEC 02/01/2008  . Weakness 11/19/2007  . SHINGLES 11/17/2007  . Essential hypertension 11/09/2006      Expected Discharge Date: Expected Discharge Date: 02/04/16   Team Members Present: Physician leading conference: Dr. 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 Pt capable of ambulating moderate distances with RW. Continues to be limiited by neuropathic pain in BLE  Min A overall with dynamic standing and W/C mobility being Mod I   bed mobility, transfers, ambulation, activity tolerance, general strengthening, desensitization, safety awareness, pt education,    Communication               Safety/Cognition/ Behavioral Observations             Pain     Pt requests 2 Hydrocodone PRN Q6HR for pain  <4  Assess for effectiveness of pain medication   Skin     Foam to pressure ulcer to buttocks; foam to bilateral heels (cracking)  pt will be free of additional skin breakdown  monitor for signs of additional skin breakdown; Q2HR     *See  Care Plan and progress notes for long and short-term goals.   Barriers to Discharge: Mobility, safety, pain, UTI, ABLA, HTN     Possible Resolutions to Barriers:  Encourage fluids, therapies, pain tolerance, abx for UTI     Discharge Planning/Teaching Needs:  Wife can assist will need to come in for education to make sure can provide supervision level      Team Discussion:  Team has downgraded his goals to in assist due to lack of progress and pt's pain issues which limit him in therapies. MD treating UTI and anemia. RN to initiate bowel program. Need wife to come in to see if can provide min assist level of care. Sacral ulcer healing. Pt has a perceived perception of pain.  Revisions to Treatment Plan:  Downgraded goals to min assist level    Continued Need for Acute Rehabilitation Level of Care: The patient requires daily medical management by a physician with specialized training in physical medicine and rehabilitation for the following conditions: Daily direction of a multidisciplinary physical rehabilitation program to ensure safe treatment while eliciting the highest outcome that is of practical value to the patient.: Yes Daily medical management of patient stability for increased activity during participation in an intensive rehabilitation regime.: Yes Daily analysis of laboratory values and/or radiology reports with any subsequent need for medication adjustment of medical intervention for : Post surgical problems;Neurological problems;Renal problems;Urological problems   [name] 01/28/2016, 9:16 AM      [name], LCSW Social Worker Signed   Patient Care Conference Date of Service: 01/20/2016  2:45 PM      Hide copied text Hover for attribution information Inpatient RehabilitationTeam Conference and Plan of Care Update Date: 01/20/2016   Time: 2:00 PM      Patient Name: Marvin Morrison      Medical Record Number: [id number]  Date of Birth: 10-11-1940 Sex: Male          Room/Bed: 4M06C/4M06C-01 Payor Info: Payor: MEDICARE / Plan: MEDICARE PART A AND B / Product Type: *No Product type* /     Admitting Diagnosis: Debility  Admit Date/Time:  01/13/2016  5:19 PM Admission Comments: No comment available    Primary Diagnosis:  <principal problem not specified> Principal Problem: <principal problem not specified>       Patient Active Problem List    Diagnosis Date Noted  . AKI (acute kidney injury) ([place])    . Constipation    . Sleep disturbance    . Labile blood pressure    . Neuropathic pain    . Benign essential HTN    . Dysuria    .  Hypoalbuminemia due to protein-calorie malnutrition ([place])    . Lymphocytosis    . Hyperglycemia    . Radiculopathy 01/13/2016  . Surgery, elective    . Post-operative pain    . Acute blood loss anemia    . Stage 3 chronic kidney disease    . Urinary retention    . Hypokalemia    . Constipation due to pain medication    . Pressure injury of skin 01/12/2016  . Malnutrition of moderate degree 01/12/2016  . Lumbar stenosis 01/11/2016  . Gait disorder 11/18/2015  . Spinal stenosis of lumbar region 10/28/2015  . Nausea with vomiting 04/19/2014  . Loss of weight 05/18/2012  . Well adult exam 05/13/2011  . 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 CRI (chronic renal insufficiency) 07/13/2010  . Anemia due to other cause 04/06/2010  . Anemia, iron deficiency 04/06/2010  . DIARRHEA, CHRONIC 03/27/2009  . 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 Hyperlipidemia 02/01/2008  . ABNORMAL GLUCOSE NEC 02/01/2008  . Weakness 11/19/2007  . SHINGLES 11/17/2007  . Essential hypertension 11/09/2006      Expected Discharge Date: Expected Discharge Date: 02/04/16   Team Members Present: Physician leading conference: Dr. 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 Up to [facility] for BM   Swallow/Nutrition/ Hydration             ADL's   Mod/Max A overall  downgrade goals to Min A overall  pt/family education, functional transfers, modified b/d, ADL AE, back precautions    Mobility   min-max A overall  mod I except supervision car, min A stairs-plan to downgrade to min A overall at next weekly note  functional mobility training, coordination, standing balance, activity tolerance, strengthening, activity tolerance, safety awareness, pt education   Communication             Safety/Cognition/ Behavioral Observations           Pain   Faces pain scale 2-4/10, Stated pain scale 9/10. 400mg  Neurontin TID, Lidocaine patch q 24hr, Robaxin 750mg  QID, 1-2 Norco q hrs. PRN  <4 on a 0-10 pain scale  assess pain q 4 hours and prn   Skin   2 unstageables (1 to each buttock), Santyl applied daily, moist gauze daily and foam dressing prn. Bilateral heels cracking with foam dressing applied. Honeycomb dressing applied to back incision CDI.  Encourage patient to turn while in bed and boost when in chair. No new skin breakdown while on rehab  Assess skin q shift and prn       *See Care Plan and progress notes for long and short-term goals.   Barriers to Discharge: Mobility, safety, urinary retention/freq, AKI, pain   Possible Resolutions to Barriers:  Encourage fluids, therapies, optimize pain and coping with pain   Discharge Planning/Teaching Needs:  Home with wife who can provide almost 24 hr care does work couple hours per week.      Team Discussion:  Pt's pain is limiting him  in therapies-his goals have been downgraded to min assist level. MD working with him on his pain meds and what works the best for him. Checking UA. BP meds adjusting also. Tremoring today. Will ask wife to come in and attend therapies with pt to see how he is progressing.  Revisions to Treatment Plan:  Downgraded goals to min assist level    Continued Need for Acute Rehabilitation Level of Care: The patient requires daily medical management by a physician with specialized training in physical medicine and rehabilitation for the following conditions: Daily direction of a multidisciplinary physical rehabilitation program to ensure safe treatment while eliciting the highest outcome that is of practical value to the patient.: Yes Daily medical management of patient stability for increased activity during participation in an intensive rehabilitation regime.: Yes Daily analysis of laboratory values and/or radiology reports with any subsequent need for medication adjustment of medical intervention for : Post surgical problems;Neurological problems;Renal problems;Urological problems   [name] 01/20/2016, 2:45 PM       Patient ID: JONATHA CAULDER, male   DOB: 05/15/1940, 76 y.o.   MRN: [id number]

## 2016-01-28 NOTE — Patient Care Conference (Signed)
Inpatient RehabilitationTeam Conference and Plan of Care Update Date: 01/27/2016   Time: 2:15 PM    Patient Name: Marvin Morrison      Medical Record Number: [id number]  Date of Birth: [birth year] Sex: Male         Room/Bed: 4M06C/4M06C-01 Payor Info: Payor: MEDICARE / Plan: MEDICARE PART A AND B / Product Type: *No Product type* /    Admitting Diagnosis: Debility  Admit Date/Time:  01/13/2016  5:19 PM Admission Comments: No comment available   Primary Diagnosis:  <principal problem not specified> Principal Problem: <principal problem not specified>  Patient Active Problem List   Diagnosis Date Noted  . Acute lower UTI   . Stage 2 chronic kidney disease   . AKI (acute kidney injury) ([place])   . Constipation   . Sleep disturbance   . Labile blood pressure   . Neuropathic pain   . Benign essential HTN   . Dysuria   . Hypoalbuminemia due to protein-calorie malnutrition ([place])   . Lymphocytosis   . Hyperglycemia   . Radiculopathy 01/13/2016  . Surgery, elective   . Post-operative pain   . Acute blood loss anemia   . Stage 3 chronic kidney disease   . Urinary retention   . Hypokalemia   . Constipation due to pain medication   . Pressure injury of skin 01/12/2016  . Malnutrition of moderate degree 01/12/2016  . Lumbar stenosis 01/11/2016  . Gait disorder 11/18/2015  . Spinal stenosis of lumbar region 10/28/2015  . Nausea with vomiting 04/19/2014  . Loss of weight 05/18/2012  . Well adult exam 05/13/2011  . 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 CRI (chronic renal insufficiency) 07/13/2010  . Anemia due to other cause 04/06/2010  . Anemia, iron deficiency 04/06/2010  . DIARRHEA, CHRONIC 03/27/2009  . 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 Hyperlipidemia 02/01/2008  . ABNORMAL GLUCOSE NEC 02/01/2008  . Weakness 11/19/2007  . SHINGLES 11/17/2007  . Essential hypertension 11/09/2006    Expected Discharge Date: Expected Discharge Date: 02/04/16  Team Members  Present: Physician leading conference: Dr. 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 Pt capable of ambulating moderate distances with RW. Continues to be limiited by neuropathic pain in BLE  Min A overall with dynamic standing and W/C mobility being Mod I   bed mobility, transfers, ambulation, activity tolerance, general strengthening, desensitization, safety awareness, pt education,    Communication             Safety/Cognition/ Behavioral Observations  Pain   Pt requests 2 Hydrocodone PRN Q6HR for pain  <4  Assess for effectiveness of pain medication   Skin   Foam to pressure ulcer to buttocks; foam to bilateral heels (cracking)  pt will be free of additional skin breakdown  monitor for signs of additional skin breakdown; Q2HR      *See Care Plan and progress notes for long and short-term goals.  Barriers to Discharge: Mobility, safety, pain, UTI, ABLA, HTN    Possible Resolutions to Barriers:  Encourage fluids, therapies, pain tolerance, abx for  UTI    Discharge Planning/Teaching Needs:  Wife can assist will need to come in for education to make sure can provide supervision level      Team Discussion:  Team has downgraded his goals to in assist due to lack of progress and pt's pain issues which limit him in therapies. MD treating UTI and anemia. RN to initiate bowel program. Need wife to come in to see if can provide min assist level of care. Sacral ulcer healing. Pt has a perceived perception of pain.  Revisions to Treatment Plan:  Downgraded goals to min assist level   Continued Need for Acute Rehabilitation Level of Care: The patient requires daily medical management by a physician with specialized training in physical medicine and rehabilitation for the following conditions: Daily direction of a multidisciplinary physical rehabilitation program to ensure safe treatment while eliciting the highest outcome that is of practical value to the patient.: Yes Daily medical management of patient stability for increased activity during participation in an intensive rehabilitation regime.: Yes Daily analysis of laboratory values and/or radiology reports with any subsequent need for medication adjustment of medical intervention for : Post surgical problems;Neurological problems;Renal problems;Urological problems  [name] 01/28/2016, 9:16 AM

## 2016-01-28 NOTE — Progress Notes (Signed)
Physical Therapy Session Note  Patient Details  Name: Marvin Morrison MRN: [id number] Date of Birth: [birth year]  Today's Date: 01/28/2016 PT Individual Time: [id number] PT Individual Time Calculation (min): 79 min   Short Term Goals: Week 2:  PT Short Term Goal 1 (Week 2): Patient will ambulate 50 ft using RW with min A consistently PT Short Term Goal 2 (Week 2): Patient will be able to transfer from bed to W/C using RW with Min A  PT Short Term Goal 3 (Week 2): Patient will be able to ascend and descend 2 steps using LRAD with Mod A consistently PT Short Term Goal 4 (Week 2): patient will demonstrate standing tolerance of 5 minutes using RW and min A  Skilled Therapeutic Interventions/Progress Updates:   Pt was found in W/C with all needs within reach. Pt complained of 3/10 pain on Right anterior thighduring treatment.  Sit-to-stand transfers were min A with lowering only with RW .  Stand pivot transfers were mod A with lowering with use of RW .   Pt self propelled in W/C 170 ft twice to rehab gym and back with supervision.  Pt performed nustep at level 4 for 10 mintes for NMR.   After nustep, pt was feeling fatigued and dizzy. BP : 95/65;  HR : 97  SpO2: 97%  sitting up in W/C. After 5 minutes, BP : 92/69  HR : 95  SpO2: 97%   Pt performed seated exercises in W/C:  - alternating high knees 2 x 10  - LAQ with 5 se chold - hip abd and add isometrics 1x10 with 5 sec hold - seated heel raises 2x15  Pt transferred into supine from EOB with supervision, HOB elevated, and using bedrails. Pt's BP in supine was 114/83. Pt was changed into hospital robe and was left in bed with all needs within reach. Nursing was notified about BP episodes during treatment.   Therapy Documentation Precautions:  Precautions Precautions: Fall, Back Precaution Booklet Issued: No Restrictions Weight Bearing Restrictions: No Other Position/Activity Restrictions: Back precautions Vital Signs: Therapy  Vitals Temp: 97.6 F (36.4 C) Temp Source: Oral Pulse Rate: 98 Resp: 18 BP: 95/65 Patient Position (if appropriate): Sitting Oxygen Therapy SpO2: 97 % O2 Device: Not Delivered     See Function Navigator for Current Functional Status.   Therapy/Group: Individual Therapy  Rosendo Gros 01/28/2016, 3:22 PM

## 2016-01-28 NOTE — Progress Notes (Signed)
Nutrition Follow-up  DOCUMENTATION CODES:   Non-severe (moderate) malnutrition in context of chronic illness   Pt meets criteria for moderate MALNUTRITION in the context of chronic illness as evidenced by moderate fat and muscle mass loss.  INTERVENTION:  Continue 30 ml Prostat po once daily, each supplement provides 100 kcal and 15 grams of protein.   Continue Boost Plus po once daily, each supplement provides 360 kcal and 14 grams of protein.   Encourage adequate PO intake.   NUTRITION DIAGNOSIS:   Increased nutrient needs related to wound healing as evidenced by estimated needs.; ongoing  GOAL:   Patient will meet greater than or equal to 90% of their needs; met  MONITOR:   PO intake, Supplement acceptance, Labs, Weight trends, Skin, I & O's  REASON FOR ASSESSMENT:   Malnutrition Screening Tool    ASSESSMENT:   76 y.o. right handed male with history of hypertension, chronic renal insufficiency stage III He has essentially been wheelchair and bed bound for 3 months with back pain radiating to the lower extremities as well as bilateral buttock and sacral ulcer. 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 No change with conservative care. Underwent laminectomy L-2-L5 with decompression of L2-3-4 and L5 and S1 nerve roots. Discectomy L5-S1 01/11/2016.   Meal completion has been 50-100% with most intake at 90-100%. Pt reports having a good appetite with no other difficulties. Pt currently has Boost Plus and Prostat ordered and has been consuming them. RD to continue with current orders to aid in wound healing.   Nutrition-Focused physical exam completed. Findings are moderate fat depletion, moderate muscle depletion, and no edema.   Labs and medications reviewed. Potassium elevated at 5.5.  Diet Order:  Diet regular Room service appropriate?  Yes; Fluid consistency: Thin  Skin:  Wound (see comment) (Stage III to buttocks)  Last BM:  1/25  Height:   Ht Readings from Last 1 Encounters:  01/13/16 6' (1.829 m)    Weight:   Wt Readings from Last 1 Encounters:  01/27/16 176 lb 12.9 oz (80.2 kg)    Ideal Body Weight:  80.9 kg  BMI:  Body mass index is 23.98 kg/m.  Estimated Nutritional Needs:   Kcal:  2050-2250  Protein:  105-115 grams  Fluid:  2- 2.2 L/day  EDUCATION NEEDS:   No education needs identified at this time  Corrin Parker, MS, RD, LDN Pager # [phone] After hours/ weekend pager # [phone]

## 2016-01-28 NOTE — Progress Notes (Signed)
Occupational Therapy Weekly Progress Note  Patient Details  Name: Marvin Morrison MRN: [id number] Date of Birth: 30-Jun-1940  Beginning of progress report period: January 14, 2016 End of progress report period: January 28, 2016  Today's Date: 01/28/2016 OT Individual Time: 1300-1400 OT Individual Time Calculation (min): 60 min    Patient has met 2 of 3 short term goals.  He is able to complete all transfers with Mod A and is progressing with use of ADL AE to increase independence with LB dressing. Pt continues to have standing balance and endurance deficit and has only been able to tolerate ~ 3 minutes in standing at longest standing bout during ADL session. Pt has demonstrated improved strength and activity tolerance needed to participate in ADLs and is tolerating more OOB activity daily.  Will continue with current OT treatment POC.    Patient continues to demonstrate the following deficits: muscle weakness and decreased standing balance, decreased postural control and decreased balance strategies and therefore will continue to benefit from skilled OT intervention to enhance overall performance with BADL and Reduce care partner burden.  Patient progressing toward long term goals..  Continue plan of care.  OT Short Term Goals Week 3:  OT Short Term Goal 1 (Week 3): Pt will tolerate 5 minutes of standing activity in preparation for ADL tasks with Mod A OT Short Term Goal 2 (Week 3): Pt will maintain dynamic standing balance with mod A to remove unilateral UE from RW to pull pants over hips OT Short Term Goal 3 (Week 3): Pt will toilet using urinal with set-up A  Skilled Therapeutic Interventions/Progress Updates:    1:1 OT session focused on transfer training, improved sit<>stand, standing endurance, and increased independence with toileting. RN removed condom catheter to begin timed toileting today. Urine collected for nursing to evaluate. Stand-pivot transfer from bed to w/c with Mod A  initially, progressing to total A to safely be seated onto w/c 2/2 posterior LOB. Pt with slight skin tear on R hand, from reaching back to w/c. Practiced toileting strategies using urinal- pt demonstrated ability to manage clothing and place urinal correctly with set-up A.  Pt then brought to therapy gym and worked on standing balance/endurance, activity tolerance with standing clothes pin activity- tactile and verbal cues to facilitate anterior weight shift and posture. B LE coordination using cones and alternating B Le's. Pt required multiple rest breaks 2/2 fatigue and tolertaed ~2 minutes standing at longest standing bout x3 trials. Pt returned to room at end of session andd left with needs met.   Therapy Documentation Precautions:  Precautions Precautions: Fall, Back Precaution Booklet Issued: No Restrictions Weight Bearing Restrictions: No Other Position/Activity Restrictions: Back precautions General: ADL: ADL ADL Comments: Please see functional navigator  See Function Navigator for Current Functional Status.   Therapy/Group: Individual Therapy  Valma Cava 01/28/2016, 7:56 AM

## 2016-01-28 NOTE — Progress Notes (Signed)
[facility]ICINE & REHABILITATION     PROGRESS NOTE  Subjective/Complaints:  Pt seen laying in bed this AM.  He slept well overnight.  He states he feels his legs now (was also previously able to feel them).  He states he is going to trial without condom cath.    ROS: Denies nausea, vomiting, diarrhea, shortness of breath or chest pain   Objective: Vital Signs: Blood pressure (!) 105/59, pulse 77, temperature 97.8 F (36.6 C), temperature source Oral, resp. rate 17, height 6' (1.829 m), weight 80.2 kg (176 lb 12.9 oz), SpO2 94 %. Dg Abd 1 View  Result Date: 01/27/2016 CLINICAL DATA:  Acute onset of loose stools.  Initial encounter. EXAM: ABDOMEN - 1 VIEW COMPARISON:  Abdominal radiograph performed 01/15/2016 FINDINGS: The visualized bowel gas pattern is unremarkable. Scattered air and stool filled loops of colon are seen; no abnormal dilatation of small bowel loops is seen to suggest small bowel obstruction. No free intra-abdominal air is identified, though evaluation for free air is limited on a single supine view. Mild degenerative change is noted at the lower lumbar spine; the sacroiliac joints are unremarkable in appearance. IMPRESSION: Unremarkable bowel gas pattern; no free intra-abdominal air seen. Small to moderate amount of stool noted in the colon. Electronically Signed   By: Garald Balding M.D.   On: 01/27/2016 20:39    Recent Labs  01/28/16 0447  WBC 7.7  HGB 8.7*  HCT 27.5*  PLT 383    Recent Labs  01/28/16 0447  NA 138  K 5.5*  CL 104  GLUCOSE 102*  BUN 23*  CREATININE 1.56*  CALCIUM 8.7*   CBG (last 3)  No results for input(s): GLUCAP in the last 72 hours.  Wt Readings from Last 3 Encounters:  01/27/16 80.2 kg (176 lb 12.9 oz)  01/11/16 75.7 kg (166 lb 14.2 oz)  12/08/15 76.2 kg (168 lb)    Physical Exam:  BP (!) 105/59 (BP Location: Right Arm)   Pulse 77   Temp 97.8 F (36.6 C) (Oral)   Resp 17   Ht 6' (1.829 m)   Wt 80.2 kg (176 lb  12.9 oz)   SpO2 94%   BMI 23.98 kg/m  Gen: NAD. Well-developed.  HENT: Normocephalic. Atraumatic. Eyes: EOMI. No discharge. Cardiovascular: RRR. No JVD. Respiratory: CTA bilaterally. Unlabored.  GI: Soft, BS+ Musculoskeletal: He exhibits no edema. No tenderness. Neurological: He is alert.  B/l UE strength 4+/5 B/l LE: 4+/5 HF,KE  LLE ADF 3+-4-/5 RLE ADF 4/5 Skin:  Stage II ulcer left and right buttocks (not examined) Scattered abrasions. Psychiatric: He has a normal mood and affect.   Assessment/Plan: 1. 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 Physiatrist is providing close team supervision and 24 hour management of active medical problems listed below. Physiatrist and rehab team continue to assess barriers to discharge/monitor patient progress toward functional and medical goals.  Function:  Bathing Bathing position Bathing activity did not occur: N/A Position: Wheelchair/chair at sink  Bathing parts Body parts bathed by patient: Right arm, Left arm, Chest, Abdomen, Left upper leg, Right upper leg, Right lower leg, Left lower leg Body parts bathed by helper: Buttocks, Front perineal area  Bathing assist Assist Level: Touching or steadying assistance(Pt > 75%)      Upper Body Dressing/Undressing Upper body dressing Upper body dressing/undressing activity did not  occur: Refused What is the patient wearing?: Pull over shirt/dress     Pull over shirt/dress - Perfomed by patient: Thread/unthread right sleeve, Thread/unthread left sleeve, Put head through opening, Pull shirt over trunk Pull over shirt/dress - Perfomed by helper: Pull shirt over trunk        Upper body assist Assist Level: Supervision or verbal cues, Set up   Set up : To obtain clothing/put  away  Lower Body Dressing/Undressing Lower body dressing Lower body dressing/undressing activity did not occur: N/A What is the patient wearing?: Pants, Non-skid slipper socks, Ted Hose     Pants- Performed by patient: Thread/unthread right pants leg, Thread/unthread left pants leg Pants- Performed by helper: Pull pants up/down Non-skid slipper socks- Performed by patient: Don/doff right sock, Don/doff left sock (w/ sock aid) Non-skid slipper socks- Performed by helper: Don/doff right sock, Don/doff left sock   Socks - Performed by helper: Don/doff left sock, Don/doff right sock   Shoes - Performed by helper: Don/doff right shoe, Don/doff left shoe, Fasten right, Fasten left       TED Hose - Performed by helper: Don/doff right TED hose, Don/doff left TED hose  Lower body assist Assist for lower body dressing: Touching or steadying assistance (Pt > 75%)      Toileting Toileting Toileting activity did not occur: Refused Toileting steps completed by patient: Adjust clothing prior to toileting Toileting steps completed by helper: Adjust clothing prior to toileting, Performs perineal hygiene, Adjust clothing after toileting Toileting Assistive Devices: Grab bar or rail  Toileting assist Assist level: Touching or steadying assistance (Pt.75%)   Transfers Chair/bed transfer   Chair/bed transfer method: Stand pivot, Ambulatory Chair/bed transfer assist level: Moderate assist (Pt 50 - 74%/lift or lower) Chair/bed transfer assistive device: Walker, Air cabin crew     Max distance: 90 ft Assist level: Touching or steadying assistance (Pt > 75%)   Wheelchair   Type: Manual Max wheelchair distance: 165 ft Assist Level: Supervision or verbal cues  Cognition Comprehension Comprehension assist level: Follows complex conversation/direction with no assist  Expression Expression assist level: Expresses complex ideas: With no assist  Social Interaction Social  Interaction assist level: Interacts appropriately with others - No medications needed.  Problem Solving Problem solving assist level: Solves complex problems: Recognizes & self-corrects  Memory Memory assist level: Complete Independence: No helper    Medical Problem List and Plan: 1.  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  DVT Prophylaxis/Anticoagulation: SCDs.   Vascular study neg for DVT on 1/11 3. Pain Management:   Hydrocodone changed to q6 PRN.  Robaxin scheduled 1/11, increased 1/12, changed to PRN on 1/18  Gabapentin 100 TID started 1/12, increased to 300 on 1/14, 400 on 1/17 due to CrCl, decreased  back to 100 TID on 1/18 due to potential interference with cognition  Kpad added 1/12  Lidoderm patch added 1/14 4. Acute blood loss anemia. Continue iron supplement.   Hb 8.7 on 1/25   Cont to monitor 5. Neuropsych: This patient is capable of making decisions on his own behalf. 6. Skin/Wound Care: Bilateral buttocks and sacral ulcer. Follow-up WOC 7. Fluids/Electrolytes/Nutrition: Routine I&O 8. Hypertension.   Verapamil 240 mg daily, increased to 360 on 1/12, reduced back to 240 on 1/14  Controlled 1/25 9. CRI stage III.   Cr 1.56 on 1/25  Encourage fluid 10. Urinary retention.   Flomax started 1/11  11. Labile K+:   K+ 5.5 on 1/25, will hold K+ supplement today. Discussed with nursing.  Labs ordered for tomorrow  Cont to monitor 12. Constipation: .   Senna d/ced on 1/18  CXR 1/25, reviewed, some stool noted, otherwise unremarkable   Fiber supplement started  13. Hyperglycemia  Relatively controlled 1/25 14. Hypoalbuminemia  Supplement initiated 1/11 15. Leukocytosis: Resolved 16. Sleep disturbance  Trazodone PRN ordered  Improving 17. Acute lower UTI  ucx positive ecoli 50k and pseudomonas 100k  Cipro 1/21-1/24  Dysuria  improving  LOS (Days) 15 A FACE TO FACE EVALUATION WAS PERFORMED  Alyviah Crandle [name] 01/28/2016 9:12 AM

## 2016-01-29 ENCOUNTER — Inpatient Hospital Stay (HOSPITAL_COMMUNITY): Payer: Medicare Other | Admitting: Occupational Therapy

## 2016-01-29 ENCOUNTER — Inpatient Hospital Stay (HOSPITAL_COMMUNITY): Payer: Medicare Other | Admitting: Physical Therapy

## 2016-01-29 LAB — BASIC METABOLIC PANEL
ANION GAP: 8 (ref 5–15)
BUN: 24 mg/dL — ABNORMAL HIGH (ref 6–20)
CALCIUM: 8.6 mg/dL — AB (ref 8.9–10.3)
CO2: 27 mmol/L (ref 22–32)
CREATININE: 1.54 mg/dL — AB (ref 0.61–1.24)
Chloride: 101 mmol/L (ref 101–111)
GFR calc Af Amer: 48 mL/min — ABNORMAL LOW (ref >60)
GFR, EST NON AFRICAN AMERICAN: 42 mL/min — AB (ref >60)
GLUCOSE: 98 mg/dL (ref 65–99)
Potassium: 4 mmol/L (ref 3.5–5.1)
Sodium: 136 mmol/L (ref 135–145)

## 2016-01-29 LAB — URINE CULTURE

## 2016-01-29 NOTE — Progress Notes (Signed)
Social Work Patient ID: Marvin Morrison, male   DOB: [birth year], 76 y.o.   MRN: [id number]  Met with pt, wife and daughter who were here for therapies to see what pt is doing and if can manage him at home. Pt is doing better today and his goals have been upgraded to supervision level. Pt would like for son in-law to be able to assist him into the home and can't be there until Sat. Discussed equipment needs and Follow up needs, all in agreement. Pt wants to re-think if a wheelchair is needed. Can discuss this next week. Have asked for wife and daughter to come back in next week to do hands on care prior to pt's discharge. Daughter will come in [place] and will plan to bring wife with her. Therapy team does feel they have things to work on until Sat 2/3, awaiting MD approval.

## 2016-01-29 NOTE — Progress Notes (Signed)
Physical Therapy Weekly Progress Note  Patient Details  Name: Marvin Morrison MRN: [id number] Date of Birth: 1940/05/25  Beginning of progress report period: January 21, 2016 End of progress report period: January 29, 2016  Today's Date: 01/29/2016 PT Individual Time: 1115-1200 and 14:15-15:12 PT Individual Time Calculation (min): 45 min and 57 min  Patient has met 3 of 4 short term goals. Pt is S - Min A with all functional mobility with inconsistent performance due to pain and deconditioning. Family education was initiated today with wife and daughter.  Patient continues to demonstrate the following deficits pain, activity tolerance,  muscle weakness, decreased cardiorespiratoy endurance, decreased coordination and decreased standing balance and decreased balance strategies and therefore will continue to benefit from skilled PT intervention to increase functional independence with mobility.  Patient progressing toward long term goals..  Plan of care revisions: Mod I with bed mobility and transfers. Supervision with car transfer, ambulation 150 ft in controlled environment with RW; ascending/descending 2 steps with LRAD . PT Short Term Goals Week 2:  PT Short Term Goal 1 (Week 2): Patient will ambulate 50 ft using RW with min A consistently PT Short Term Goal 1 - Progress (Week 2): Met PT Short Term Goal 2 (Week 2): Patient will be able to transfer from bed to W/C using RW with Min A  PT Short Term Goal 2 - Progress (Week 2): Met PT Short Term Goal 3 (Week 2): Patient will be able to ascend and descend 2 steps using LRAD with Mod A consistently PT Short Term Goal 3 - Progress (Week 2): Met PT Short Term Goal 4 (Week 2): patient will demonstrate standing tolerance of 5 minutes using RW and min A PT Short Term Goal 4 - Progress (Week 2): Progressing toward goal Week 3:  PT Short Term Goal 1 (Week 3): = LTG due to anticipated LOS  Skilled Therapeutic Interventions/Progress Updates:  Pt was in  W/C upon arrival to room. Pt self-propelled in W/C 170 ft with supervision to rehab gym.   Pt performed 6 stand pivot transfers supervision from W/C and mat with RW and no cues.   Pt ascended and descended 4 six inch steps with CGA-Min A with BUE support.  Pt performed step up/down on 4 inch platform with RW twice with supervision to mimic steps to enter home. During last attempt with step ups/downs, pt fatigued and required min A to shift weight into W/C with stand pivot transfer   Pt ambulated 95 ft with RW and supervision to begin return to room. Pt was left in W/C with all needs within reach.   Session 2:  Pt was in room sitting in W/C. Daughter and wife were present to initiate family education.   Pt ambulated 121 ft close supervision-CGA with RW to rehab apartment. One episode of LOB occurred when pt addressed nursing while turning around nursing station. Min A was needed to correct  Pt was sup-min A with stand pivot and ambulatory transfers throughout treatment today as level of assist increased as pt fatigued. Sit-to-stand was supervision  Pt performed sit <-> supine supervision on apartment bed with step stool. Pt was given verbal instructions for how to proper use step. One episode of LOB was seen here with stand pivot transfer that required min A to correct. Pt's wife reported that a step is available at home.   Pt performed car transfer supervision with RW and minimal cues for sequence and handplacement.   Pt ascended and descended  forward 4 six inch steps close Supervision - min A with bilateral handrails. Pt performed 5 minutes on the nustep with BLE only before returning to room.   Pt and family educated on recommendation for 24/7 supervision, DME needs, HHPT follow up, home setup, progress towards goals and POC, Out of Bed tolerance and importance of pressure relief at discharge. Family verbalized understanding and were in agreement.   Pt required min A to take off pants and  supervision to take off shirt and put on robe before returning to bed. Pt was left in bed with all needs within reach. Family was asked if all concerns or questions have been answered with session before leaving. Family is satisfied with POC and pt's progress. Daughter will return on Wed 1/31 to continue family education     Therapy Documentation Precautions:  Precautions Precautions: Fall, Back Precaution Booklet Issued: No Restrictions Weight Bearing Restrictions: No Other Position/Activity Restrictions: Back precautions   See Function Navigator for Current Functional Status.  Therapy/Group: Individual Therapy  Rosendo Gros 01/29/2016, 11:06 AM

## 2016-01-29 NOTE — Progress Notes (Signed)
[facility]ILIT[facility]     PROGRESS NOTE  Subjective/Complaints:  Pt seen laying in bed this AM.  Per nursing, he is having frequent voids, but was able to void without condom cath.  Pt notes he slept well overnight and that his bowel movements have improved as well.    ROS: Denies nausea, vomiting, diarrhea, shortness of breath or chest pain   Objective: Vital Signs: Blood pressure 124/60, pulse 78, temperature 97.8 F (36.6 C), temperature source Oral, resp. rate 18, height 6' (1.829 m), weight 80.2 kg (176 lb 12.9 oz), SpO2 97 %. Dg Abd 1 View  Result Date: 01/27/2016 CLINICAL DATA:  Acute onset of loose stools.  Initial encounter. EXAM: ABDOMEN - 1 VIEW COMPARISON:  Abdominal radiograph performed 01/15/2016 FINDINGS: The visualized bowel gas pattern is unremarkable. Scattered air and stool filled loops of colon are seen; no abnormal dilatation of small bowel loops is seen to suggest small bowel obstruction. No free intra-abdominal air is identified, though evaluation for free air is limited on a single supine view. Mild degenerative change is noted at the lower lumbar spine; the sacroiliac joints are unremarkable in appearance. IMPRESSION: Unremarkable bowel gas pattern; no free intra-abdominal air seen. Small to moderate amount of stool noted in the colon. Electronically Signed   By: Garald Balding M.D.   On: 01/27/2016 20:39    Recent Labs  01/28/16 0447  WBC 7.7  HGB 8.7*  HCT 27.5*  PLT 383    Recent Labs  01/28/16 0447 01/29/16 0529  NA 138 136  K 5.5* 4.0  CL 104 101  GLUCOSE 102* 98  BUN 23* 24*  CREATININE 1.56* 1.54*  CALCIUM 8.7* 8.6*   CBG (last 3)  No results for input(s): GLUCAP in the last 72 hours.  Wt Readings from Last 3 Encounters:  01/27/16 80.2 kg (176 lb 12.9 oz)  01/11/16 75.7 kg (166 lb 14.2 oz)  12/08/15 76.2 kg (168 lb)    Physical Exam:  BP 124/60 (BP Location: Left Arm)   Pulse 78   Temp 97.8 F (36.6 C) (Oral)    Resp 18   Ht 6' (1.829 m)   Wt 80.2 kg (176 lb 12.9 oz)   SpO2 97%   BMI 23.98 kg/m  Gen: NAD. Well-developed.  HENT: Normocephalic. Atraumatic. Eyes: EOMI. No discharge. Cardiovascular: RRR. No JVD. Respiratory: CTA bilaterally. Unlabored.  GI: Soft, BS+ Musculoskeletal: He exhibits no edema. No tenderness. Neurological: He is alert.  B/l UE strength 4+/5 B/l LE: 4+/5 HF,KE  LLE ADF 3+-4-/5 (stable) RLE ADF 4/5 (stable) Skin:  Stage II ulcer left and right buttocks (not examined) Scattered abrasions. Psychiatric: He has a normal mood and affect.   Assessment/Plan: 1. 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 Physiatrist is providing close team supervision and 24 hour management of active medical problems listed below. Physiatrist and rehab team continue to assess barriers to discharge/monitor patient progress toward functional and medical goals.  Function:  Bathing Bathing position Bathing activity did not occur: N/A Position: Wheelchair/chair at sink  Bathing parts Body parts bathed by patient: Right arm, Left arm, Chest, Abdomen, Left upper leg, Right upper leg, Right lower leg, Left lower leg Body parts bathed by helper: Buttocks, Front perineal area  Bathing assist Assist Level: Touching or steadying assistance(Pt > 75%)      Upper Body  Dressing/Undressing Upper body dressing Upper body dressing/undressing activity did not occur: Refused What is the patient wearing?: Pull over shirt/dress     Pull over shirt/dress - Perfomed by patient: Thread/unthread right sleeve, Thread/unthread left sleeve, Put head through opening, Pull shirt over trunk Pull over shirt/dress - Perfomed by helper: Pull shirt over trunk        Upper body assist Assist Level:  Supervision or verbal cues, Set up   Set up : To obtain clothing/put away  Lower Body Dressing/Undressing Lower body dressing Lower body dressing/undressing activity did not occur: N/A What is the patient wearing?: Pants, Non-skid slipper socks, Ted Hose     Pants- Performed by patient: Thread/unthread right pants leg, Thread/unthread left pants leg Pants- Performed by helper: Pull pants up/down Non-skid slipper socks- Performed by patient: Don/doff right sock, Don/doff left sock (w/ sock aid) Non-skid slipper socks- Performed by helper: Don/doff right sock, Don/doff left sock   Socks - Performed by helper: Don/doff left sock, Don/doff right sock   Shoes - Performed by helper: Don/doff right shoe, Don/doff left shoe, Fasten right, Fasten left       TED Hose - Performed by helper: Don/doff right TED hose, Don/doff left TED hose  Lower body assist Assist for lower body dressing: Touching or steadying assistance (Pt > 75%)      Toileting Toileting Toileting activity did not occur: Refused Toileting steps completed by patient: Adjust clothing prior to toileting Toileting steps completed by helper: Adjust clothing prior to toileting, Performs perineal hygiene, Adjust clothing after toileting Toileting Assistive Devices: Grab bar or rail  Toileting assist Assist level: Touching or steadying assistance (Pt.75%)   Transfers Chair/bed transfer   Chair/bed transfer method: Stand pivot Chair/bed transfer assist level: Moderate assist (Pt 50 - 74%/lift or lower) Chair/bed transfer assistive device: Walker, Air cabin crew     Max distance: 90 ft Assist level: Touching or steadying assistance (Pt > 75%)   Wheelchair   Type: Manual Max wheelchair distance: 170 ft Assist Level: Supervision or verbal cues  Cognition Comprehension Comprehension assist level: Understands basic 75 - 89% of the time/ requires cueing 10 - 24% of the time  Expression Expression assist  level: Expresses complex 90% of the time/cues < 10% of the time  Social Interaction Social Interaction assist level: Interacts appropriately with others with medication or extra time (anti-anxiety, antidepressant).  Problem Solving Problem solving assist level: Solves complex 90% of the time/cues < 10% of the time  Memory Memory assist level: Recognizes or recalls 90% of the time/requires cueing < 10% of the time    Medical Problem List and Plan: 1.  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  DVT Prophylaxis/Anticoagulation: SCDs.   Vascular study neg for DVT on 1/11 3. Pain Management:   Hydrocodone changed to q6 PRN.  Robaxin scheduled 1/11, increased 1/12, changed to PRN on 1/18  Gabapentin 100 TID started 1/12, increased to 300 on 1/14, 400 on 1/17 due to CrCl, decreased  back to 100 TID on 1/18 due to potential interference with cognition  Kpad added 1/12  Lidoderm patch added 1/14 4. Acute blood loss anemia. Continue iron supplement.   Hb 8.7 on 1/25   Cont to monitor 5. Neuropsych: This patient is capable of making decisions on his own behalf. 6. Skin/Wound Care: Bilateral buttocks and sacral ulcer. Follow-up WOC 7. Fluids/Electrolytes/Nutrition: Routine I&O 8. Hypertension.  Verapamil 240 mg daily, increased to 360 on 1/12, reduced back to 240 on 1/14  Controlled 1/26 9. CRI stage III.   Cr 1.54 on 1/26  Encourage fluid 10. Urinary retention.   Flomax started 1/11, increased on 1/25 11. Labile K+:   K+ 4.0 on 1/26  Cont to monitor 12. Constipation: .   Senna d/ced on 1/18  CXR 1/25, reviewed, some stool noted, otherwise unremarkable   Fiber supplement started  13. Hyperglycemia  Relatively controlled 1/26 14. Hypoalbuminemia  Supplement initiated 1/11 15. Leukocytosis: Resolved 16. Sleep disturbance  Trazodone PRN  ordered  Improving 17. Acute lower UTI  ucx positive ecoli 50k and pseudomonas 100k  Cipro 1/21-1/24  Repeat UA neg  LOS (Days) 16 A FACE TO FACE EVALUATION WAS PERFORMED  Ankit [name] 01/29/2016 8:36 AM

## 2016-01-29 NOTE — Progress Notes (Signed)
Occupational Therapy Session Note  Patient Details  Name: Marvin Morrison MRN: [id number] Date of Birth: 02-Jun-1940  Today's Date: 01/29/2016  Session 1 OT Individual Time: 0900-1000 OT Individual Time Calculation (min): 60 min   Session 2 OT Individual Time: 1330-1415 OT Individual Time Calculation (min): 45 min    Short Term Goals: Week 3:  OT Short Term Goal 1 (Week 3): Pt will tolerate 5 minutes of standing activity in preparation for ADL tasks with Mod A OT Short Term Goal 2 (Week 3): Pt will maintain dynamic standing balance with mod A to remove unilateral UE from RW to pull pants over hips OT Short Term Goal 3 (Week 3): Pt will toilet using urinal with set-up A  Skilled Therapeutic Interventions/Progress Updates:  Session 1   1:1 OT session focused on modified bathing/dressintg, shower transfers, and toileting. Pt greeted in bed and agreeable to shower level bathing today. Squat pivot transfer from bed to drop arm wc with Min A. Stand-pivo transfer from drop=-arm wc to tub bench with Mod A + grab bars. Pt completed bathing and dressing + sit<>stand with overall Mod A. Pt with decreased awareness of deficits when discussing home bathroom set-up and shower transfers. Pt reports " we will just figure it out once i'm home." OT reiterated importance of practicing functional transfers while in CIR for safe transition and ADL participation at home. Will continue to address this with pt and family. Pt left seated in wc at end of session with needs met.   Session 2 OT treatment session focused on pt/family education and dc planning. Pt's daughter and spouse present throughout session. Educated family on safety and body mechanics when assisting pt with functional transfers. Discussed home bathroom set-up and practiced simulated transfer onto shower chair. Pt unable to safely step over simulated shower ledge at this time, so pt completed stand-pivot transfer from w/c > shower chair with Min/mod A.  Currently pt uses old wooden chair to sit on for showering. OT highly encouraged pt to get shower chair-pt's daughter agreed that chair is not safe and will continue to encourage pt to obtain proper DME. Pt then completed stand-pivot to raised toilet over commode in similar fashion. Demonstration and verbal cues to family member of proper positioning to assist pt on/off commode.   Therapy Documentation Precautions:  Precautions Precautions: Fall, Back Precaution Booklet Issued: No Restrictions Weight Bearing Restrictions: No Other Position/Activity Restrictions: Back precautions ADL: ADL ADL Comments: Please see functional navigator  See Function Navigator for Current Functional Status.   Therapy/Group: Individual Therapy  Valma Cava 01/29/2016, 4:04 PM

## 2016-01-30 ENCOUNTER — Inpatient Hospital Stay (HOSPITAL_COMMUNITY): Payer: Medicare Other | Admitting: Occupational Therapy

## 2016-01-30 NOTE — Progress Notes (Signed)
Occupational Therapy Session Note  Patient Details  Name: Marvin Morrison MRN: [id number] Date of Birth: 1940-01-08  Today's Date: 01/30/2016 OT Individual Time: 1440-1510 OT Individual Time Calculation (min): 30 min    Short Term Goals: Week 3:  OT Short Term Goal 1 (Week 3): Pt will tolerate 5 minutes of standing activity in preparation for ADL tasks with Mod A OT Short Term Goal 2 (Week 3): Pt will maintain dynamic standing balance with mod A to remove unilateral UE from RW to pull pants over hips OT Short Term Goal 3 (Week 3): Pt will toilet using urinal with set-up A  Skilled Therapeutic Interventions/Progress Updates:    Pt seen this session to faciltate dynamic standing balance and tolerance. Pt received in bed, worked on sitting to EOB with close S.  Sit to stand from EOB to RW with S. In standing wt shifts laterally. Used RW to transfer to arm chair for arm push ups 10x., sit to stand to RW 2x for 1 minute each of static standing with S, and sit to stand without walker to challenge balance with min-mod A to maintain balance initially and then pt was able to do so with min A. Transferred back to bed.  Pt's wife present and participated in therapy session for education.  Therapy Documentation Precautions:  Precautions Precautions: Fall, Back Precaution Booklet Issued: No Restrictions Weight Bearing Restrictions: No Other Position/Activity Restrictions: Back precautions    Pain: Pain Assessment Pain Assessment: 0-10 Pain Score: 5  Pain Type: Chronic pain Pain Location: Leg Pain Orientation: Right Pain Descriptors / Indicators: Aching Pain Frequency: Constant Pain Onset: On-going Pain Intervention(s): Medication (See eMAR) ADL: ADL ADL Comments: Please see functional navigator  See Function Navigator for Current Functional Status.   Therapy/Group: Individual Therapy  SAGUIER,JULIA 01/30/2016, 4:15 PM

## 2016-01-30 NOTE — Progress Notes (Signed)
Marvin Morrison is a 76 y.o. male 1940/05/09 [id number]  Subjective: No new complaints. No new problems. Slept well. Feeling OK.  Objective: Vital signs in last 24 hours: Temp:  [97.8 F (36.6 C)-97.9 F (36.6 C)] 97.9 F (36.6 C) (01/27 0531) Pulse Rate:  [76-101] 76 (01/27 0531) Resp:  [17-18] 17 (01/27 0531) BP: (107-118)/(61-63) 107/61 (01/27 0531) SpO2:  [97 %-100 %] 97 % (01/27 0531) Weight change:  Last BM Date: 01/29/16  Intake/Output from previous day: 01/26 0701 - 01/27 0700 In: 960 [P.O.:960] Out: 2150 [Urine:2150] Last cbgs: CBG (last 3)  No results for input(s): GLUCAP in the last 72 hours.   Physical Exam General: No apparent distress   HEENT: not dry Lungs: Normal effort. Lungs clear to auscultation, no crackles or wheezes. Cardiovascular: Regular rate and rhythm, no edema Abdomen: S/NT/ND; BS(+) Musculoskeletal:  unchanged Neurological: No new neurological deficits Wounds: clean   Skin: chronic rash. Aging changes Mental state: Alert, oriented, cooperative    Lab Results: BMET    Component Value Date/Time   NA 136 01/29/2016 0529   K 4.0 01/29/2016 0529   CL 101 01/29/2016 0529   CO2 27 01/29/2016 0529   GLUCOSE 98 01/29/2016 0529   GLUCOSE 110 (H) 11/28/2005 1622   BUN 24 (H) 01/29/2016 0529   CREATININE 1.54 (H) 01/29/2016 0529   CALCIUM 8.6 (L) 01/29/2016 0529   GFRNONAA 42 (L) 01/29/2016 0529   GFRAA 48 (L) 01/29/2016 0529   CBC    Component Value Date/Time   WBC 7.7 01/28/2016 0447   RBC 2.86 (L) 01/28/2016 0447   HGB 8.7 (L) 01/28/2016 0447   HCT 27.5 (L) 01/28/2016 0447   PLT 383 01/28/2016 0447   MCV 96.2 01/28/2016 0447   MCH 30.4 01/28/2016 0447   MCHC 31.6 01/28/2016 0447   RDW 14.6 01/28/2016 0447   LYMPHSABS 2.0 01/28/2016 0447   MONOABS 0.9 01/28/2016 0447   EOSABS 0.7 01/28/2016 0447   BASOSABS 0.1 01/28/2016 0447    Studies/Results: No results found.  Medications: I have reviewed the patient's current  medications.  Assessment/Plan:  1. S/p status post L2-5 decompression/discectomy             Cont w/CIR  2. DVT proph. Cont w/CSD              Vascular study neg for DVT on 1/11 3. Leg pain:              Norco, Robaxine prn, gabapentin, Kpad, Lidoderm patch added 1/14 4. Anemia. Continue iron supplement.              Hb 8.7 on 1/25              Cont to monitor 5. Neuropsych: This patient is capable of making decisions on his own behalf. 6. Skin/Wound Care: Bilateral buttocks and sacral ulcer. Follow-up WOC 7. Fluids/Electrolytes/Nutrition: Routine I&O 8. HTN. Verapamil  9. CKD 3 10. Urinary retention.              Flomax started 1/11, increased on 1/25 11. Labile K+:              K+ 4.0 on 1/26             Cont to monitor 12. Constipation: .              LOC 13. Hyperglycemia             Relatively controlled 1/26 14. Hypoalbuminemia  Supplement initiated 1/11 15. Leukocytosis: Resolved 16. Insomnia  -- Trazodone 17. Acute lower UTI             ucx positive ecoli 50k and pseudomonas 100k             Cipro 1/21-1/24             Repeat UA neg Length of stay, days: [place] , MD 01/30/2016, 2:39 PM

## 2016-01-31 ENCOUNTER — Inpatient Hospital Stay (HOSPITAL_COMMUNITY): Payer: Medicare Other | Admitting: *Deleted

## 2016-01-31 NOTE — Progress Notes (Signed)
Marvin Morrison is a 76 y.o. male [birth year] [id number]  Subjective: No new complaints. No new problems. Slept well. Feeling OK.  Objective: Vital signs in last 24 hours: Temp:  [97.4 F (36.3 C)-97.6 F (36.4 C)] 97.6 F (36.4 C) (01/28 0527) Pulse Rate:  [69-105] 69 (01/28 0527) Resp:  [18] 18 (01/28 0527) BP: (103-121)/(57-70) 103/57 (01/28 0527) SpO2:  [97 %] 97 % (01/28 0527) Weight change:  Last BM Date: 01/30/16  Intake/Output from previous day: 01/27 0701 - 01/28 0700 In: 1080 [P.O.:1080] Out: 775 [Urine:775] Last cbgs: CBG (last 3)  No results for input(s): GLUCAP in the last 72 hours.   Physical Exam General: No apparent distress   HEENT: not dry Lungs: Normal effort. Lungs clear to auscultation, no crackles or wheezes. Cardiovascular: Regular rate and rhythm, no edema Abdomen: S/NT/ND; BS(+) Musculoskeletal:  unchanged Neurological: No new neurological deficits Wounds: clean   Skin: clear  Aging changes Mental state: Alert, oriented, cooperative, talkative    Lab Results: BMET    Component Value Date/Time   NA 136 01/29/2016 0529   K 4.0 01/29/2016 0529   CL 101 01/29/2016 0529   CO2 27 01/29/2016 0529   GLUCOSE 98 01/29/2016 0529   GLUCOSE 110 (H) 11/28/2005 1622   BUN 24 (H) 01/29/2016 0529   CREATININE 1.54 (H) 01/29/2016 0529   CALCIUM 8.6 (L) 01/29/2016 0529   GFRNONAA 42 (L) 01/29/2016 0529   GFRAA 48 (L) 01/29/2016 0529   CBC    Component Value Date/Time   WBC 7.7 01/28/2016 0447   RBC 2.86 (L) 01/28/2016 0447   HGB 8.7 (L) 01/28/2016 0447   HCT 27.5 (L) 01/28/2016 0447   PLT 383 01/28/2016 0447   MCV 96.2 01/28/2016 0447   MCH 30.4 01/28/2016 0447   MCHC 31.6 01/28/2016 0447   RDW 14.6 01/28/2016 0447   LYMPHSABS 2.0 01/28/2016 0447   MONOABS 0.9 01/28/2016 0447   EOSABS 0.7 01/28/2016 0447   BASOSABS 0.1 01/28/2016 0447    Studies/Results: No results found.  Medications: I have reviewed the patient's current  medications.  A/P:  1. Status post L2-5 decompression discectomy - continue with inpatient rehabilitation services 2. DVT prophylaxis. Continue with CSD 3. Leg pain continue with Norco Robaxin gabapentin Lidoderm patch K pad 4. Anemia - we will recheck CBC 5. Hypertension continue with verapamil 6. CK D stage III 7. Urinary retention on Flomax 8. Labile potassium levels obtain lab work 9. Insomnia continue with trazodone    Length of stay, days: 18  Walker Kehr , MD 01/31/2016, 9:45 AM

## 2016-01-31 NOTE — Progress Notes (Signed)
Physical Therapy Session Note  Patient Details  Name: Marvin Morrison MRN: [id number] Date of Birth: 1940/01/23  Today's Date: 01/31/2016 PT Individual Time: [id number] PT Individual Time Calculation (min): 45 min    Skilled Therapeutic Interventions/Progress Updates:  Patient in bed agrees to therapy intervention, complains of pain in B feet, but is eager to get up and do his therapy. Transfer to sit EOB  with Supervision, sitting EOB dressing with min A to retrieve clothes and to pull pants up.  Gait Training with RW 2 x 120 feet with Supervision and cues to reduce velocity correct posture and focus on path following. No LOB noted.  NuStep x 10 min with resistance level at 4 in order to increase strength , activity tolerance and facilitate reciprocal movement.  At the end of session patient returned to room, seating in w/c with education provided for pressure relief. QR belt on , all needs within reach . ]   Therapy Documentation Precautions:  Precautions Precautions: Fall, Back Precaution Booklet Issued: No Restrictions Weight Bearing Restrictions: No Other Position/Activity Restrictions: Back precautions   See Function Navigator for Current Functional Status.   Therapy/Group: Individual Therapy  [name] Spanish 01/31/2016, 12:39 PM

## 2016-02-01 ENCOUNTER — Inpatient Hospital Stay (HOSPITAL_COMMUNITY): Payer: Medicare Other | Admitting: Occupational Therapy

## 2016-02-01 ENCOUNTER — Inpatient Hospital Stay (HOSPITAL_COMMUNITY): Payer: Medicare Other | Admitting: Physical Therapy

## 2016-02-01 LAB — CBC WITH DIFFERENTIAL/PLATELET
Basophils Absolute: 0.1 10*3/uL (ref 0.0–0.1)
Basophils Relative: 1 %
EOS ABS: 0.6 10*3/uL (ref 0.0–0.7)
Eosinophils Relative: 8 %
HCT: 26.2 % — ABNORMAL LOW (ref 39.0–52.0)
Hemoglobin: 8.7 g/dL — ABNORMAL LOW (ref 13.0–17.0)
LYMPHS ABS: 2.1 10*3/uL (ref 0.7–4.0)
Lymphocytes Relative: 30 %
MCH: 31.5 pg (ref 26.0–34.0)
MCHC: 33.2 g/dL (ref 30.0–36.0)
MCV: 94.9 fL (ref 78.0–100.0)
MONOS PCT: 9 %
Monocytes Absolute: 0.6 10*3/uL (ref 0.1–1.0)
Neutro Abs: 3.7 10*3/uL (ref 1.7–7.7)
Neutrophils Relative %: 52 %
PLATELETS: 264 10*3/uL (ref 150–400)
RBC: 2.76 MIL/uL — ABNORMAL LOW (ref 4.22–5.81)
RDW: 14.6 % (ref 11.5–15.5)
WBC: 7 10*3/uL (ref 4.0–10.5)

## 2016-02-01 LAB — BASIC METABOLIC PANEL
Anion gap: 9 (ref 5–15)
BUN: 27 mg/dL — ABNORMAL HIGH (ref 6–20)
CALCIUM: 8.7 mg/dL — AB (ref 8.9–10.3)
CHLORIDE: 99 mmol/L — AB (ref 101–111)
CO2: 28 mmol/L (ref 22–32)
CREATININE: 1.56 mg/dL — AB (ref 0.61–1.24)
GFR calc Af Amer: 48 mL/min — ABNORMAL LOW (ref >60)
GFR calc non Af Amer: 41 mL/min — ABNORMAL LOW (ref >60)
Glucose, Bld: 109 mg/dL — ABNORMAL HIGH (ref 65–99)
Potassium: 3.9 mmol/L (ref 3.5–5.1)
SODIUM: 136 mmol/L (ref 135–145)

## 2016-02-01 LAB — IRON AND TIBC
IRON: 25 ug/dL — AB (ref 45–182)
Saturation Ratios: 10 % — ABNORMAL LOW (ref 17.9–39.5)
TIBC: 244 ug/dL — ABNORMAL LOW (ref 250–450)
UIBC: 219 ug/dL

## 2016-02-01 MED ORDER — VITAMIN C 500 MG PO TABS
250.0000 mg | ORAL_TABLET | Freq: Three times a day (TID) | ORAL | Status: DC
  Administered 2016-02-01 – 2016-02-06 (×16): 250 mg via ORAL
  Filled 2016-02-01 (×16): qty 1

## 2016-02-01 MED ORDER — SODIUM CHLORIDE 0.9 % IV SOLN
INTRAVENOUS | Status: DC
  Administered 2016-02-01: 21:00:00 via INTRAVENOUS

## 2016-02-01 NOTE — Progress Notes (Signed)
Occupational Therapy Session Note  Patient Details  Name: Marvin Morrison MRN: [id number] Date of Birth: 03-13-1940  Today's Date: 02/01/2016 OT Individual Time: 0900-1011 OT Individual Time Calculation (min): 71 min    Short Term Goals: Week 1:  OT Short Term Goal 1 (Week 1): Pt will complete LB dressing using ADL AE with Mod A OT Short Term Goal 1 - Progress (Week 1): Partly met OT Short Term Goal 2 (Week 1): Pt will complete toilet transfers with Mod A and RW OT Short Term Goal 3 (Week 1): Pt will complete sit<>stand with Mod A in preparation for ADL activities. OT Short Term Goal 3 - Progress (Week 1): Met Week 2:  OT Short Term Goal 1 (Week 2): Pt will complete LB dressing using ADL AE with Mod A OT Short Term Goal 1 - Progress (Week 2): Met OT Short Term Goal 2 (Week 2): Pt will complete toilet transfers with Mod A and RW OT Short Term Goal 2 - Progress (Week 2): Met OT Short Term Goal 3 (Week 2): Pt will tolerate 5 minutes of standing activities with Mod A for balance OT Short Term Goal 3 - Progress (Week 2): Partly met Week 3:  OT Short Term Goal 1 (Week 3): Pt will tolerate 5 minutes of standing activity in preparation for ADL tasks with Mod A OT Short Term Goal 2 (Week 3): Pt will maintain dynamic standing balance with mod A to remove unilateral UE from RW to pull pants over hips OT Short Term Goal 3 (Week 3): Pt will toilet using urinal with set-up A  Skilled Therapeutic Interventions/Progress Updates:    Upon entering the room, pt seated in wheelchair awaiting therapist. Pt declined bathing and dressing at this time. Pt reports he has never tried to don and doff shoes before and becomes very argumentative when therapist attempts to help him problem solve solution. Pt reprots, "I will figure it out when I get home." OT provided education regarding the purpose of therapy is for [place] to assist him in increasing his independence with these functional tasks. PT verbalized understanding.  Pt able to cross B LEs over opposite knee in order to don B shoes and shoe buttons added to allow to to fasten shoes himself this session. Pt utilized urinal multiple times this session without assistance from therapist. Pt propelled wheelchair 200' onto [place] tower with 2 rest breaks secondary to fatigue. Pt ambulating 80' this session with close supervision and use of RW for safety. Pt returned wheelchair back to room in same manner with call bell and all needed items within reach upon exiting the room.  Therapy Documentation Precautions:  Precautions Precautions: Fall, Back Precaution Booklet Issued: No Restrictions Weight Bearing Restrictions: No Other Position/Activity Restrictions: Back precautions General:   Vital Signs:   Pain:   ADL: ADL ADL Comments: Please see functional navigator Exercises:   Other Treatments:    See Function Navigator for Current Functional Status.   Therapy/Group: Individual Therapy  Gypsy Decant 02/01/2016, 12:47 PM

## 2016-02-01 NOTE — Progress Notes (Signed)
Social Work Patient ID: Marvin Morrison, male   DOB: 07-Nov-1940, 76 y.o.   MRN: [id number]  Met with pt to inform MD has approved his stay until 2/3. Have contacted daughter and pt is aware and feels much relief  regarding being able to get into his home. Will make arrangements for Sat.

## 2016-02-01 NOTE — Progress Notes (Signed)
Occupational Therapy Session Note  Patient Details  Name: Marvin Morrison MRN: [id number] Date of Birth: 05-12-1940  Today's Date: 02/01/2016 OT Individual Time: 0815-0900 OT Individual Time Calculation (min): 45 min   Skilled Therapeutic Interventions/Progress Updates:    1:1 OT session focused on LB dressing modifications, sit<>stand, and dynamic standing balance. Pt came to sitting EOB w/ supervision. He was able to thread B Les through pants with min verbal cues for modified technique. Sit<>stand with Min A and Min A standing balance while removing unilateral UE from RW to pull pants over hips. Stand-step turn transfer to wc with Min /mod A for posterior LOB. Pt completed grooming in sitting at the sink w/ set-up. Discussed toileting using urinal, pt able to place urinal and void successfully sitting up in wc with set-up to obtain urinal. Discussed modified strategies for donning/doffing socks w/ back precautions. Pt able to cross R ankle over L knee and don/doff R sock with min A, but unable to cross L LE 2/2 hypersensitivity and pain. Pt left in wc at end of session with needs met and urinal within reach.   Therapy Documentation Precautions:  Precautions Precautions: Fall, Back Precaution Booklet Issued: No Restrictions Weight Bearing Restrictions: No Other Position/Activity Restrictions: Back precautions Pain: Pain Assessment Pain Assessment: 0-10 Pain Score: 8  Pain Type: Chronic pain Pain Location: Leg Pain Orientation: Right Pain Descriptors / Indicators: Aching Pain Frequency: Constant Pain Intervention(s): repositioned ADL: ADL ADL Comments: Please see functional navigator Exercises:   Other Treatments:    See Function Navigator for Current Functional Status.   Therapy/Group: Individual Therapy  Valma Cava 02/01/2016, 8:33 AM

## 2016-02-01 NOTE — Progress Notes (Signed)
Physical Therapy Session Note  Patient Details  Name: Marvin Morrison MRN: [id number] Date of Birth: [birth year]  Today's Date: 02/01/2016 PT Individual Time: 1030-1120 PT Individual Time Calculation (min): 50 min   Short Term Goals: Week 3:  PT Short Term Goal 1 (Week 3): = LTG due to anticipated LOS  Skilled Therapeutic Interventions/Progress Updates: Pt presented in w/c agreeable to therapy. C/o fatigue as stating have had several sessions this am. Ascend/decend x 4 stairs with B rails step to pattern. Gait 75 ft with RW, cues for improved posture and narrow BOS noted. Pt returned to room for use of BR. Performed stand pivot to/from elevated toilet with min guard. PTA transferred to gym for time management and pt participated in NuStep L3 x8 min. Pt returned to bed performing squat pivot transfer with min guard and demonstrated supervision sit to supine. Pt left in bed with all current needs met.      Therapy Documentation Precautions:  Precautions Precautions: Fall, Back Precaution Booklet Issued: No Restrictions Weight Bearing Restrictions: No Other Position/Activity Restrictions: Back precautions   See Function Navigator for Current Functional Status.   Therapy/Group: Individual Therapy  Tristy Udovich  Lilian Fuhs, PTA  02/01/2016, 12:36 PM

## 2016-02-01 NOTE — Progress Notes (Signed)
[facility]IC[facility] & REHABILITATION     PROGRESS NOTE  Subjective/Complaints:  Pt seen sitting up in bed this AM.  He states he had a good weekend and is ready for his therapies to begin at 9:00AM.  He slept well overnight. Weekend notes reviewed, lab work ordered for anemia.  ROS: Denies nausea, vomiting, diarrhea, shortness of breath or chest pain   Objective: Vital Signs: Blood pressure 110/65, pulse 66, temperature 97.6 F (36.4 C), temperature source Oral, resp. rate 18, height 6' (1.829 m), weight 80.2 kg (176 lb 12.9 oz), SpO2 98 %. No results found.  Recent Labs  02/01/16 0229  WBC 7.0  HGB 8.7*  HCT 26.2*  PLT 264    Recent Labs  02/01/16 0229  NA 136  K 3.9  CL 99*  GLUCOSE 109*  BUN 27*  CREATININE 1.56*  CALCIUM 8.7*   CBG (last 3)  No results for input(s): GLUCAP in the last 72 hours.  Wt Readings from Last 3 Encounters:  01/27/16 80.2 kg (176 lb 12.9 oz)  01/11/16 75.7 kg (166 lb 14.2 oz)  12/08/15 76.2 kg (168 lb)    Physical Exam:  BP 110/65 (BP Location: Left Arm)   Pulse 66   Temp 97.6 F (36.4 C) (Oral)   Resp 18   Ht 6' (1.829 m)   Wt 80.2 kg (176 lb 12.9 oz)   SpO2 98%   BMI 23.98 kg/m  Gen: NAD. Well-developed.  HENT: Normocephalic. Atraumatic. Eyes: EOMI. No discharge. Cardiovascular: RRR. No JVD. Respiratory: CTA bilaterally. Unlabored.  GI: Soft, BS+ Musculoskeletal: He exhibits no edema. No tenderness. Neurological: He is alert.  B/l UE strength 4+/5 B/l LE: 4+/5 HF,KE  LLE ADF 3+/5  RLE ADF 4-/5  Skin:  Stage II ulcer left and right buttocks (not examined) Scattered abrasions. Psychiatric: He has a normal mood and affect.   Assessment/Plan: 1. 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 Physiatrist is providing close team supervision and 24 hour management of active medical problems listed below. Physiatrist and rehab team continue to assess barriers to discharge/monitor patient progress toward functional and medical goals.  Function:  Bathing Bathing position Bathing activity did not occur: N/A Position: Shower  Bathing parts Body parts bathed by patient: Right arm, Left arm, Chest, Abdomen, Left upper leg, Right upper leg, Right lower leg, Left lower leg, Front perineal area Body parts bathed by helper: Buttocks  Bathing assist Assist Level: Touching or steadying assistance(Pt > 75%)      Upper Body Dressing/Undressing Upper body dressing Upper body dressing/undressing activity did not occur: Refused What is the patient wearing?: Pull over shirt/dress     Pull over shirt/dress - Perfomed by patient: Thread/unthread right sleeve, Thread/unthread left sleeve, Put head through opening, Pull shirt over trunk Pull over shirt/dress - Perfomed by helper: Pull shirt over trunk        Upper body assist Assist Level: Supervision or verbal cues   Set up : To obtain clothing/put away  Lower Body Dressing/Undressing Lower body dressing Lower body dressing/undressing activity did not occur: N/A What is the patient wearing?: Pants     Pants- Performed by patient: Fasten/unfasten pants, Thread/unthread left pants leg, Thread/unthread right pants leg Pants- Performed by helper: Pull pants up/down Non-skid slipper socks- Performed by patient: Don/doff right sock, Don/doff left sock (w/ sock aid) Non-skid  slipper socks- Performed by helper: Don/doff right sock, Don/doff left sock   Socks - Performed by helper: Don/doff left sock, Don/doff right sock   Shoes - Performed by helper: Don/doff right shoe, Don/doff left shoe, Fasten right, Fasten left       TED Hose - Performed by helper: Don/doff right TED hose, Don/doff left TED hose  Lower body assist Assist for  lower body dressing: Touching or steadying assistance (Pt > 75%)      Toileting Toileting Toileting activity did not occur: Refused Toileting steps completed by patient: Adjust clothing prior to toileting Toileting steps completed by helper: Adjust clothing prior to toileting, Performs perineal hygiene, Adjust clothing after toileting Toileting Assistive Devices: Grab bar or rail  Toileting assist Assist level: Touching or steadying assistance (Pt.75%)   Transfers Chair/bed transfer   Chair/bed transfer method: Ambulatory, Stand pivot Chair/bed transfer assist level: Touching or steadying assistance (Pt > 75%) Chair/bed transfer assistive device: Walker, Air cabin crew     Max distance: 95 ft Assist level: Supervision or verbal cues   Wheelchair   Type: Manual Max wheelchair distance: 170 ft Assist Level: Supervision or verbal cues  Cognition Comprehension Comprehension assist level: Understands basic 75 - 89% of the time/ requires cueing 10 - 24% of the time  Expression Expression assist level: Expresses complex 90% of the time/cues < 10% of the time  Social Interaction Social Interaction assist level: Interacts appropriately with others with medication or extra time (anti-anxiety, antidepressant).  Problem Solving Problem solving assist level: Solves complex 90% of the time/cues < 10% of the time  Memory Memory assist level: Recognizes or recalls 90% of the time/requires cueing < 10% of the time    Medical Problem List and Plan: 1.  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  DVT Prophylaxis/Anticoagulation: SCDs.   Vascular study neg for DVT on 1/11 3. Pain Management:   Hydrocodone changed to q6 PRN.  Robaxin scheduled 1/11, increased 1/12, changed to PRN on 1/18  Gabapentin 100 TID started 1/12, increased to 300 on  1/14, 400 on 1/17 due to CrCl, decreased  back to 100 TID on 1/18 due to potential interference with cognition  Kpad added 1/12  Lidoderm patch added 1/14 4. Acute blood loss anemia, iron deficiency.    Continue iron supplement, vitamin C added 1/29.   Iron studies reviewed  Hb 8.7 on 1/29   Cont to monitor 5. Neuropsych: This patient is capable of making decisions on his own behalf. 6. Skin/Wound Care: Bilateral buttocks and sacral ulcer. Follow-up WOC 7. Fluids/Electrolytes/Nutrition: Routine I&O 8. Hypertension.   Verapamil 240 mg daily, increased to 360 on 1/12, reduced back to 240 on 1/14  Controlled 1/29 9. CRI stage III.   Cr 1.56 on 1/29  Encourage fluid  Will IV bolus x1 10. Urinary retention.   Flomax started 1/11, increased on 1/25 11. Labile K+:   K+ 3.9 on 1/29  Cont to monitor 12. Constipation: .   Senna d/ced on 1/18  CXR 1/25, reviewed, some stool noted, otherwise unremarkable   Fiber supplement started  13. Hyperglycemia  Relatively controlled 1/26 14. Hypoalbuminemia  Supplement initiated 1/11 15. Leukocytosis: Resolved 16. Sleep disturbance  Trazodone PRN ordered  Improving 17. Acute lower UTI  ucx positive ecoli 50k and pseudomonas 100k  Cipro 1/21-1/24  Repeat neg  LOS (Days) 19 A FACE TO FACE EVALUATION WAS  PERFORMED  Aubrei Bouchie [name] 02/01/2016 8:49 AM

## 2016-02-01 NOTE — Progress Notes (Signed)
Occupational Therapy Session Note  Patient Details  Name: Marvin Morrison MRN: [id number] Date of Birth: 21-Dec-1940  Today's Date: 02/01/2016 OT Individual Time: 1300-1330 OT Individual Time Calculation (min): 30 min   Short Term Goals: Week 1:  OT Short Term Goal 1 (Week 1): Pt will complete LB dressing using ADL AE with Mod A OT Short Term Goal 1 - Progress (Week 1): Partly met OT Short Term Goal 2 (Week 1): Pt will complete toilet transfers with Mod A and RW OT Short Term Goal 3 (Week 1): Pt will complete sit<>stand with Mod A in preparation for ADL activities. OT Short Term Goal 3 - Progress (Week 1): Met   Week 2:  OT Short Term Goal 1 (Week 2): Pt will complete LB dressing using ADL AE with Mod A OT Short Term Goal 1 - Progress (Week 2): Met OT Short Term Goal 2 (Week 2): Pt will complete toilet transfers with Mod A and RW OT Short Term Goal 2 - Progress (Week 2): Met OT Short Term Goal 3 (Week 2): Pt will tolerate 5 minutes of standing activities with Mod A for balance OT Short Term Goal 3 - Progress (Week 2): Partly met   Week 3:  OT Short Term Goal 1 (Week 3): Pt will tolerate 5 minutes of standing activity in preparation for ADL tasks with Mod A OT Short Term Goal 2 (Week 3): Pt will maintain dynamic standing balance with mod A to remove unilateral UE from RW to pull pants over hips OT Short Term Goal 3 (Week 3): Pt will toilet using urinal with set-up A  Skilled Therapeutic Interventions/Progress Updates:  Pain: Pt with complaints of pain in RLE, no rate given. This therapist notified pt's nurse of complaints of pain and request for pain medications  Upon entering room, pt found supine in bed. Pt engaged in bed mobility with supervision and sat EOB. Pt stood to pull pants up to waist with CGA. Pt then transferred into w/c with CGA. Therapist handed pt his shoes and pt donned bilateral shoes by crossing feet over knees so he did not break his back precautions. Therapist donned  bilateral leg rests and pt propelled self to therapy gym. Once in therapy gym, pt performed 10 minutes on omnicycle level 5; 5 minutes backwards and 5 minutes frontwards to focus on functional BUE strengthening and functional endurance. Therapist propelled pt back to room after exercise and left pt seated in w/c with all needs within reach. As therapist leaving, pt requested hospital gown. Therapist notified patient's NT of this.    Therapy Documentation Precautions:  Precautions Precautions: Fall, Back Precaution Booklet Issued: No Restrictions Weight Bearing Restrictions: No Other Position/Activity Restrictions: Back precautions  Vital Signs: Therapy Vitals Temp: 97.6 F (36.4 C) Temp Source: Oral Pulse Rate: 66 Resp: 18 BP: 110/65 Patient Position (if appropriate): Lying Oxygen Therapy SpO2: 98 % O2 Device: Not Delivered  ADL: ADL ADL Comments: Please see functional navigator  See Function Navigator for Current Functional Status.  Therapy/Group: Individual Therapy  Chrys Racer , MS, OTR/L, CLT  02/01/2016, 1:50 PM

## 2016-02-02 ENCOUNTER — Inpatient Hospital Stay (HOSPITAL_COMMUNITY): Payer: Medicare Other | Admitting: Occupational Therapy

## 2016-02-02 ENCOUNTER — Inpatient Hospital Stay (HOSPITAL_COMMUNITY): Payer: Medicare Other | Admitting: Physical Therapy

## 2016-02-02 NOTE — Progress Notes (Signed)
Occupational Therapy Session Note  Patient Details  Name: Marvin Morrison MRN: [id number] Date of Birth: 1940-06-09  Today's Date: 02/02/2016  Session 1 OT Individual Time: 0900-1000 OT Individual Time Calculation (min): 60 min   Session 2 OT Individual Time: 0900-1000 OT Individual Time Calculation (min): 60 min   Short Term Goals: Week 3:  OT Short Term Goal 1 (Week 3): Pt will tolerate 5 minutes of standing activity in preparation for ADL tasks with Mod A OT Short Term Goal 2 (Week 3): Pt will maintain dynamic standing balance with mod A to remove unilateral UE from RW to pull pants over hips OT Short Term Goal 3 (Week 3): Pt will toilet using urinal with set-up A  Skilled Therapeutic Interventions/Progress Updates:  Session 1   1:1 OT session focused on modified bathing/dressing, improved sit<>stand, toileting/toilet transfers, and standing endurance. Pt greeted in bed, transferred OOB>w/c via stand-step turn transfer w/ Min A. Grooming completed at the sink with overall set-up. Focus on modified strategies for LB dressing, and pt able to cross ankle over knee and doff both socks today with minimal pain. LB bathing with set-up and Min A using long-handled sponge. Min/Mod A for dynamic standing balance when washing bottom, cues to keep unilateral UE on RW when washing bottom or pulling pants over hips. Pt then reported urgent need for bathroom. Stand-pivot w/ grab bars and min A. Pt unsuccessful with Bm. LB dressing with overall Min A to pull pants over hips. Pt returned to wc and was left seated in w.c at end of session with needs met.   Session 2 1:1 OT session focused on LB strength/coordination, LB dressing, transfer training, improved sit<>stand, and standing balance. Pt able to don/doff socks seated EOB with set-up A. Pt then completed stand step turn transfer from bed>wc with Min A and Rw. Pt brought to therapy gym and completed 10 minutes on NuStep on level 4. Pt then ambulated short  distance from NuStep to therapy mat. Addressed dynamic standing balance and endurance with card matching activity. Pt tolerated ~ 5 minutes standing with Min A for balance. Pt then brought to therapy apartment and worked on simulated walk-in shower transfer + step over 4 inch ledge. Pt/therapist collaboration of safest transfer method with home set-up. Walk-in shower transfer completed with overall Min A for steadying. Pt also agreeable to getting shower chair now after discussion and practicing transfer with [place]. Pt returned to room and ambulated 10 feet into bathroom to transfer onto toilet w/ Min A. Pt to call nursing staff when finished and RN notified of pt status.   Therapy Documentation Precautions:  Precautions Precautions: Fall, Back Precaution Booklet Issued: No Restrictions Weight Bearing Restrictions: No Other Position/Activity Restrictions: Back precautions  Pain: Pain Assessment Pain Assessment: 0-10 Pain Score: 6  Pain Location: Leg Pain Orientation: Left Pain Descriptors / Indicators: Aching Pain Onset: With Activity Pain Intervention(s): Repositioned ADL: ADL ADL Comments: Please see functional navigator  See Function Navigator for Current Functional Status.   Therapy/Group: Individual Therapy  Valma Cava 02/02/2016, 12:40 PM

## 2016-02-02 NOTE — Plan of Care (Signed)
Problem: RH PAIN MANAGEMENT Goal: RH STG PAIN MANAGED AT OR BELOW PT'S PAIN GOAL <4 on a 0-10 pain scale  Outcome: Not Progressing Reports pain >4

## 2016-02-02 NOTE — Progress Notes (Signed)
[facility] PHYSICAL MEDICINE & REHABILITATION     PROGRESS NOTE  Subjective/Complaints:  Pt sitting up in bed this AM watching TV.  He is appreciative of his care.  He has questions about IVF overnight. He notes improvement in bowel/bladder function.   ROS: Denies nausea, vomiting, diarrhea, shortness of breath or chest pain   Objective: Vital Signs: Blood pressure 120/60, pulse 72, temperature 98 F (36.7 C), temperature source Oral, resp. rate 18, height 6' (1.829 m), weight 80.2 kg (176 lb 12.9 oz), SpO2 98 %. No results found.  Recent Labs  02/01/16 0229  WBC 7.0  HGB 8.7*  HCT 26.2*  PLT 264    Recent Labs  02/01/16 0229  NA 136  K 3.9  CL 99*  GLUCOSE 109*  BUN 27*  CREATININE 1.56*  CALCIUM 8.7*   CBG (last 3)  No results for input(s): GLUCAP in the last 72 hours.  Wt Readings from Last 3 Encounters:  01/27/16 80.2 kg (176 lb 12.9 oz)  01/11/16 75.7 kg (166 lb 14.2 oz)  12/08/15 76.2 kg (168 lb)    Physical Exam:  BP 120/60 (BP Location: Left Arm)   Pulse 72   Temp 98 F (36.7 C) (Oral)   Resp 18   Ht 6' (1.829 m)   Wt 80.2 kg (176 lb 12.9 oz)   SpO2 98%   BMI 23.98 kg/m  Gen: NAD. Well-developed.  HENT: Normocephalic. Atraumatic. Eyes: EOMI. No discharge. Cardiovascular: RRR. No JVD. Respiratory: CTA bilaterally. Unlabored.  GI: Soft, BS+ Musculoskeletal: He exhibits no edema. No tenderness. Neurological: He is alert.  B/l UE strength 4+/5 B/l LE: 4+/5 HF,KE  LLE ADF 3+-4-/5  RLE ADF 4--4/5  Skin:  Stage II ulcer left and right buttocks (not examined) Scattered abrasions. Psychiatric: He has a normal mood and affect.   Assessment/Plan: 1. 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 Physiatrist is providing close team  supervision and 24 hour management of active medical problems listed below. Physiatrist and rehab team continue to assess barriers to discharge/monitor patient progress toward functional and medical goals.  Function:  Bathing Bathing position Bathing activity did not occur: N/A Position: Shower  Bathing parts Body parts bathed by patient: Right arm, Left arm, Chest, Abdomen, Left upper leg, Right upper leg, Right lower leg, Left lower leg, Front perineal area Body parts bathed by helper: Buttocks  Bathing assist Assist Level: Touching or steadying assistance(Pt > 75%)      Upper Body Dressing/Undressing Upper body dressing Upper body dressing/undressing activity did not occur: Refused What is the patient wearing?: Pull over shirt/dress     Pull over shirt/dress - Perfomed by patient: Thread/unthread right sleeve, Thread/unthread left sleeve, Put head through opening, Pull shirt over trunk Pull over shirt/dress - Perfomed by helper: Pull shirt over trunk        Upper body assist Assist Level: Supervision or verbal cues   Set up : To obtain clothing/put away  Lower Body Dressing/Undressing Lower body dressing Lower body dressing/undressing activity did not occur: N/A What is the patient wearing?: Shoes     Pants- Performed by patient: Fasten/unfasten pants, Thread/unthread left pants leg, Thread/unthread right pants leg Pants- Performed by helper: Pull pants up/down Non-skid slipper socks- Performed by patient: Don/doff right sock, Don/doff left sock (w/ sock aid) Non-skid slipper socks- Performed by helper: Don/doff right sock, Don/doff  left sock   Socks - Performed by helper: Don/doff left sock, Don/doff right sock Shoes - Performed by patient: Don/doff right shoe, Don/doff left shoe, Fasten right, Fasten left Shoes - Performed by helper: Don/doff right shoe, Don/doff left shoe, Fasten right, Fasten left       TED Hose - Performed by helper: Don/doff right TED hose, Don/doff  left TED hose  Lower body assist Assist for lower body dressing: Touching or steadying assistance (Pt > 75%)      Toileting Toileting Toileting activity did not occur: Refused Toileting steps completed by patient: Adjust clothing prior to toileting, Performs perineal hygiene, Adjust clothing after toileting Toileting steps completed by helper: Adjust clothing prior to toileting, Performs perineal hygiene, Adjust clothing after toileting Toileting Assistive Devices: Grab bar or rail  Toileting assist Assist level: Supervision or verbal cues   Transfers Chair/bed transfer   Chair/bed transfer method: Ambulatory, Stand pivot Chair/bed transfer assist level: Touching or steadying assistance (Pt > 75%) Chair/bed transfer assistive device: Walker, Air cabin crew     Max distance: 90 Assist level: Supervision or verbal cues   Wheelchair   Type: Manual Max wheelchair distance: 170 ft Assist Level: Supervision or verbal cues  Cognition Comprehension Comprehension assist level: Understands basic 75 - 89% of the time/ requires cueing 10 - 24% of the time  Expression Expression assist level: Expresses complex 90% of the time/cues < 10% of the time  Social Interaction Social Interaction assist level: Interacts appropriately with others with medication or extra time (anti-anxiety, antidepressant).  Problem Solving Problem solving assist level: Solves complex 90% of the time/cues < 10% of the time  Memory Memory assist level: Recognizes or recalls 90% of the time/requires cueing < 10% of the time    Medical Problem List and Plan: 1.  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  DVT Prophylaxis/Anticoagulation: SCDs.   Vascular study neg for DVT on 1/11 3. Pain Management:   Hydrocodone changed to q6 PRN.  Robaxin scheduled 1/11,  increased 1/12, changed to PRN on 1/18  Gabapentin 100 TID started 1/12, increased to 300 on 1/14, 400 on 1/17 due to CrCl, decreased  back to 100 TID on 1/18 due to potential interference with cognition  Kpad added 1/12  Lidoderm patch added 1/14 4. Acute blood loss anemia, iron deficiency.    Continue iron supplement, vitamin C added 1/29.   Iron studies reviewed  Hb 8.7 on 1/29   Cont to monitor  Labs ordered for tomorrow 5. Neuropsych: This patient is capable of making decisions on his own behalf. 6. Skin/Wound Care: Bilateral buttocks and sacral ulcer. Follow-up WOC 7. Fluids/Electrolytes/Nutrition: Routine I&O 8. Hypertension.   Verapamil 240 mg daily, increased to 360 on 1/12, reduced back to 240 on 1/14  Controlled 1/30 9. CRI stage III.   Cr 1.56 on 1/29  Encourage fluid  Will IV bolus x1  Labs ordered for tomorrow 10. Urinary retention.   Flomax started 1/11, increased on 1/25 11. Labile K+:   K+ 3.9 on 1/29  Cont to monitor  Labs ordered for tomorrow 12. Constipation: .   Senna d/ced on 1/18  CXR 1/25, reviewed, some stool noted, otherwise unremarkable   Fiber supplement started   Improving 13. Hyperglycemia  Relatively controlled 1/30 14. Hypoalbuminemia  Supplement initiated 1/11 15. Leukocytosis: Resolved 16. Sleep disturbance  Trazodone PRN ordered  Improving 17. Acute  lower UTI  ucx positive ecoli 50k and pseudomonas 100k  Cipro 1/21-1/24  Repeat neg  LOS (Days) 20 A FACE TO FACE EVALUATION WAS PERFORMED  Robertt Buda [name] 02/02/2016 9:01 AM

## 2016-02-02 NOTE — Progress Notes (Signed)
Physical Therapy Session Note  Patient Details  Name: Marvin Morrison MRN: [id number] Date of Birth: Dec 11, 1940  Today's Date: 02/02/2016 PT Individual Time: 1103-1205 PT Individual Time Calculation (min): 62 min   Short Term Goals: Week 2:  PT Short Term Goal 1 (Week 2): Patient will ambulate 50 ft using RW with min A consistently PT Short Term Goal 1 - Progress (Week 2): Met PT Short Term Goal 2 (Week 2): Patient will be able to transfer from bed to W/C using RW with Min A  PT Short Term Goal 2 - Progress (Week 2): Met PT Short Term Goal 3 (Week 2): Patient will be able to ascend and descend 2 steps using LRAD with Mod A consistently PT Short Term Goal 3 - Progress (Week 2): Met PT Short Term Goal 4 (Week 2): patient will demonstrate standing tolerance of 5 minutes using RW and min A PT Short Term Goal 4 - Progress (Week 2): Progressing toward goal  Skilled Therapeutic Interventions/Progress Updates:  Pt was in W/C upon arrival to room. Pt was not complaining of any pain, however expressed feeling fatigued.   Pt self-propelled in W/C 170 ft to rehab gym with supervision.   Pt required supervision and mod cues for handplacement with all stand pivot and ambulatory transfers with RW . As pt fatigued through therapy, more verbal cues for handplacement and sequence was required.   Pt performed 10 min on Nustep at level 4 for NMR of BLE. Pt performed Nustep with no UE assistance.    Pt ascended forward and descend forward 4 six inch steps with use of 1 handrail and Mod A. Handrail used was right side when facing up.   Due to pt's request, pt attempted to ascend 15 regular steps in stairwell. Pt required max A and completed 2 steps before fatiguing and demonstrating unsafe behavior.    Pt ambulated 133 ft and 28 ft supervision with RW. Pt maneuvered over 3 inch platform and around 5 stationary cones with the 28 ft walk,but required encouragement initially to attempt obstacles.   Pt  self-propelled 100 ft back to room. Pt transferred into supine in bed with supervision and with RW. Pt was left in bed with all needs within reach.   Daughter is scheduled to come in on 1/31 to observe how to guard and instruct pt for curbs, stairs, transfers, ambulation.       Therapy Documentation Precautions:  Precautions Precautions: Fall, Back Precaution Booklet Issued: No Restrictions Weight Bearing Restrictions: No Other Position/Activity Restrictions: Back precautions   See Function Navigator for Current Functional Status.   Therapy/Group: Individual Therapy  Rosendo Gros 02/02/2016, 1:03 PM

## 2016-02-03 ENCOUNTER — Inpatient Hospital Stay (HOSPITAL_COMMUNITY): Payer: Medicare Other | Admitting: Physical Therapy

## 2016-02-03 ENCOUNTER — Inpatient Hospital Stay (HOSPITAL_COMMUNITY): Payer: Medicare Other | Admitting: Occupational Therapy

## 2016-02-03 LAB — CBC WITH DIFFERENTIAL/PLATELET
BASOS ABS: 0.1 10*3/uL (ref 0.0–0.1)
BASOS PCT: 1 %
EOS ABS: 0.4 10*3/uL (ref 0.0–0.7)
EOS PCT: 6 %
HCT: 25.7 % — ABNORMAL LOW (ref 39.0–52.0)
HEMOGLOBIN: 8.3 g/dL — AB (ref 13.0–17.0)
Lymphocytes Relative: 23 %
Lymphs Abs: 1.7 10*3/uL (ref 0.7–4.0)
MCH: 30.4 pg (ref 26.0–34.0)
MCHC: 32.3 g/dL (ref 30.0–36.0)
MCV: 94.1 fL (ref 78.0–100.0)
Monocytes Absolute: 1 10*3/uL (ref 0.1–1.0)
Monocytes Relative: 13 %
Neutro Abs: 4 10*3/uL (ref 1.7–7.7)
Neutrophils Relative %: 57 %
PLATELETS: 257 10*3/uL (ref 150–400)
RBC: 2.73 MIL/uL — AB (ref 4.22–5.81)
RDW: 14.3 % (ref 11.5–15.5)
WBC: 7.1 10*3/uL (ref 4.0–10.5)

## 2016-02-03 LAB — BASIC METABOLIC PANEL
Anion gap: 5 (ref 5–15)
BUN: 19 mg/dL (ref 6–20)
CHLORIDE: 103 mmol/L (ref 101–111)
CO2: 28 mmol/L (ref 22–32)
CREATININE: 1.31 mg/dL — AB (ref 0.61–1.24)
Calcium: 8.5 mg/dL — ABNORMAL LOW (ref 8.9–10.3)
GFR, EST AFRICAN AMERICAN: 59 mL/min — AB (ref >60)
GFR, EST NON AFRICAN AMERICAN: 51 mL/min — AB (ref >60)
Glucose, Bld: 95 mg/dL (ref 65–99)
Potassium: 3.3 mmol/L — ABNORMAL LOW (ref 3.5–5.1)
SODIUM: 136 mmol/L (ref 135–145)

## 2016-02-03 MED ORDER — POTASSIUM CHLORIDE CRYS ER 20 MEQ PO TBCR
20.0000 meq | EXTENDED_RELEASE_TABLET | Freq: Two times a day (BID) | ORAL | Status: AC
  Administered 2016-02-03 – 2016-02-04 (×4): 20 meq via ORAL
  Filled 2016-02-03 (×4): qty 1

## 2016-02-03 MED ORDER — POTASSIUM CHLORIDE 20 MEQ PO PACK
20.0000 meq | PACK | Freq: Two times a day (BID) | ORAL | Status: DC
  Administered 2016-02-03: 20 meq via ORAL
  Filled 2016-02-03: qty 1

## 2016-02-03 NOTE — Progress Notes (Signed)
Social Work Patient ID: Marvin Morrison, male   DOB: 02-09-1940, 76 y.o.   MRN: [id number]  Met with pt, wife and daughter to discuss discharge needs. Marvin Morrison-PT recommends staying on the first floor temporary Until he is able to do stairs. Pt does not like this at all and insists he will crawl up the stairs this is where his bed and things are he needs for the day. Daughter and wife to go out and buy a recliner for him to use On the first floor. He became angry and told his family to: " Get out and don't come back." Daughter voiced her husband will not carry him up the stairs at home to the second story. Education completed and daughter and wife Left to go make arrangements for home and allow pt to think and process all of this. Agreeable to rental wheelchair, bedside commode and tub seat, along with home health therapies. Will continue to work on discharge needs.

## 2016-02-03 NOTE — Progress Notes (Signed)
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   Continue Boost Plus po once daily, each supplement provides 360 kcal and 14 grams of protein.   Encourage adequate PO intake.   NUTRITION DIAGNOSIS:   Increased nutrient needs related to wound healing as evidenced by estimated needs; ongoing  GOAL:   Patient will meet greater than or equal to 90% of their needs; met  MONITOR:   PO intake, Supplement acceptance, Labs, Weight trends, Skin, I & O's  REASON FOR ASSESSMENT:   Malnutrition Screening Tool    ASSESSMENT:   76 y.o. right handed male with history of hypertension, chronic renal insufficiency stage III He has essentially been wheelchair and bed bound for 3 months with back pain radiating to the lower extremities as well as bilateral buttock and sacral ulcer. 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 No change with conservative care. Underwent laminectomy L-2-L5 with decompression of L2-3-4 and L5 and S1 nerve roots. Discectomy L5-S1 01/11/2016.  Meal completion has been mostly 100%. Intake has been good. Pt currently has Boost Plus and Prostat ordered to aid in wound healing and has been consuming them. RD to continue with current orders. Labs and medications reviewed.   Diet Order:  Diet regular Room service appropriate? Yes; Fluid consistency: Thin  Skin:  Wound (see comment) (Stage III to buttocks)  Last BM:  1/28  Height:   Ht Readings from Last 1 Encounters:  01/13/16 6' (1.829 m)    Weight:   Wt Readings from Last 1 Encounters:  02/03/16 179 lb 10.8 oz (81.5 kg)    Ideal Body Weight:  80.9 kg  BMI:  Body mass index is 24.37 kg/m.  Estimated  Nutritional Needs:   Kcal:  2050-2250  Protein:  105-115 grams  Fluid:  2- 2.2 L/day  EDUCATION NEEDS:   No education needs identified at this time  Corrin Parker, MS, RD, LDN Pager # [phone] After hours/ weekend pager # [phone]

## 2016-02-03 NOTE — Progress Notes (Signed)
Occupational Therapy Session Note  Patient Details  Name: Marvin Morrison MRN: [id number] Date of Birth: February 10, 1940  Today's Date: 02/03/2016 OT Individual Time: 0800-0900 OT Individual Time Calculation (min): 60 min   Short Term Goals: Week 3:  OT Short Term Goal 1 (Week 3): Pt will tolerate 5 minutes of standing activity in preparation for ADL tasks with Mod A OT Short Term Goal 2 (Week 3): Pt will maintain dynamic standing balance with mod A to remove unilateral UE from RW to pull pants over hips OT Short Term Goal 3 (Week 3): Pt will toilet using urinal with set-up A  Skilled Therapeutic Interventions/Progress Updates:    1:1 OT session focused on pt/family education, modified bathing/dressing, and transfer training. Pt's daughter and spouse present for OT session. Pt's spouse seemingly  hesitant to be hands on with pt requiring Mod cues and demonstration to assist pt with transfer OOB to w/c. Pt completed bathing/dressing w/sit<>stand at the sink with overall Min/mod A. Ambulated to bathroom for toilet transfer with min/Mod A and verbal cues from OT to spouse on safe positioning to assist with toilet transfers. Pt unsuccessful with Bm, required min guard A for balance while managing clothing s/p. Pt then brought down to therapy apartment and practiced simulated walk-in shower transfer with spouse assistance. Discussed safe technique in regards to home bathroom set-up- overall Min/Mod A for transfer while stepping over small shower ledge. Pt returned to room at end of session and left in wc with family and RN to administer medications.   Therapy Documentation Precautions:  Precautions Precautions: Fall, Back Precaution Booklet Issued: No Restrictions Weight Bearing Restrictions: No Other Position/Activity Restrictions: Back precautions  Pain: Pain Assessment Pain Assessment: 0-10 Pain Score: 5  Faces Pain Scale: No hurt Pain Type: Surgical pain Pain Location: Back Pain Orientation:  Lower Pain Descriptors / Indicators: Aching Pain Onset: With Activity Pain Intervention(s): Repositioned ADL: ADL ADL Comments: Please see functional navigator Exercises:   Other Treatments:    See Function Navigator for Current Functional Status.   Therapy/Group: Individual Therapy  Valma Cava 02/03/2016, 12:44 PM

## 2016-02-03 NOTE — Progress Notes (Signed)
Physical Therapy Session Note  Patient Details  Name: Marvin Morrison MRN: [id number] Date of Birth: [birth year]  Today's Date: 02/03/2016 PT Individual Time: [id number] and [id number] PT Individual Time Calculation (min): 68 min and 73 min  Short Term Goals: Week 3:  PT Short Term Goal 1 (Week 3): = LTG due to anticipated LOS  Skilled Therapeutic Interventions/Progress Updates:  Session 1:  Pt was in W/C with wife and daughter. Family education and training with wife and daughter was continued with focus on stairs, curbs, car transfer, and guarding with gait and chair/bed transfers.   Pt attempted to ascend and descend normal stairwell with therapist to mimic home environment to reach 2nd floor where pt is insisiting on staying. Pt achieved 2 step with R handrail and mod A from therapist before fatiguing and verbalizing need to sit down.   Pt went up forward and down backwards 2 stairs on 3 inch steps with wife guard patient with CGA to mimic entering home from front porch. Wife demonstrated ability to safely guard pt with activity with daughter watching.    Wife demonstrated safe CGA guarding with sit-to-stand transfers throughout therapy with daughter watching.   Wife demonstrated safe CGA with 100 ft ambulation, 80 ft ambulation with Rw and car transfer during session with daughter watching. Wife provided proper verbal cues for handplacement and sequence when needed. One bout of LOB occurred with curb practice that required min A from wife to correct.   Wife and daughter verbalized understanding for PT recommendation that patient remain on 1st floor due to safety concern with flight to second floor. Wife is buying recliner for patient to sleep on first floor. CSW was contacted for update to discharge details.   Session 2: Pt was in bed upon arrival and was willing to participate in therapy. Pt required supervision to transfer to EOB sitting for ambulatory transfer to W/C. Pt self-propelled  200 ft to Rehab gym in W/C with supervision.   Pt was supervision with supine to sit and ambulatory transfers with RW throughout therapy. Pt required mod cues for handplacement with sitting down with RW   Pt performed 10 min on Nustep with BLE only at level 4 for NMR for BLE.  Pt performed therapeutic exercises to develop Home Exercise program. Exercises performed were OTAGO exercises with recommendation for supervision that are cleared to use bt need supervision were: standing marching in place, standing hip abduction, standing knee flexion with 5 sec hold, mini squats with UE support. 1 x 5 reps for all exercises. Pt was given OTAGO handout with exercises listed.  Exercies cleared for patient to do at home without supervision are: seated LAQ with 5 sec hold, supine heel slides, supine short arc quads with 5 sec hold, supine hip abduction, ankle pumps, bridges on mat/bed. Supine handout was given to patient.   Pt ambulated 190 ft supervision with RW to return to room. Pt was left in W/C with all needs within reach.       Therapy Documentation Precautions:  Precautions Precautions: Fall, Back Precaution Booklet Issued: No Restrictions Weight Bearing Restrictions: No Other Position/Activity Restrictions: Back precautions See Function Navigator for Current Functional Status.   Therapy/Group: Individual Therapy  Rosendo Gros 02/03/2016, 9:11 AM

## 2016-02-03 NOTE — Progress Notes (Signed)
[place] [facility]IC[facility] & [facility]ABILITATION     PROGRESS NOTE  Subjective/Complaints:  Pt seen sitting up at the edge of the bed working with OT.  Family at bedside.  Family and patient with several questions regarding discharge medications, home medications, follow up with PCP, prognosis for recovery, DME.   ROS: Denies nausea, vomiting, diarrhea, shortness of breath or chest pain   Objective: Vital Signs: Blood pressure (!) 110/58, pulse 70, temperature 98 F (36.7 C), temperature source Oral, resp. rate 18, height 6' (1.829 m), weight 80.2 kg (176 lb 12.9 oz), SpO2 93 %. No results found.  Recent Labs  02/01/16 0229 02/03/16 0458  WBC 7.0 7.1  HGB 8.7* 8.3*  HCT 26.2* 25.7*  PLT 264 257    Recent Labs  02/01/16 0229 02/03/16 0458  NA 136 136  K 3.9 3.3*  CL 99* 103  GLUCOSE 109* 95  BUN 27* 19  CREATININE 1.56* 1.31*  CALCIUM 8.7* 8.5*   CBG (last 3)  No results for input(s): GLUCAP in the last 72 hours.  Wt Readings from Last 3 Encounters:  01/27/16 80.2 kg (176 lb 12.9 oz)  01/11/16 75.7 kg (166 lb 14.2 oz)  12/08/15 76.2 kg (168 lb)    Physical Exam:  BP (!) 110/58 (BP Location: Left Arm)   Pulse 70   Temp 98 F (36.7 C) (Oral)   Resp 18   Ht 6' (1.829 m)   Wt 80.2 kg (176 lb 12.9 oz)   SpO2 93%   BMI 23.98 kg/m  Gen: NAD. Well-developed.  HENT: Normocephalic. Atraumatic. Eyes: EOMI. No discharge. Cardiovascular: RRR. No JVD. Respiratory: CTA bilaterally. Unlabored.  GI: Soft, BS+ Musculoskeletal: He exhibits no edema. No tenderness. Neurological: He is alert.  B/l UE strength 4+/5 B/l LE: 4+/5 HF,KE  LLE ADF 3+-4-/5 (unchanged) RLE ADF 4--4/5  Skin:  Scattered abrasions. Psychiatric: He has a normal mood and affect.   Assessment/Plan: 1. 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 Physiatrist is providing close team supervision and 24 hour management of active medical problems listed below. Physiatrist and rehab team continue to assess barriers to discharge/monitor patient progress toward functional and medical goals.  Function:  Bathing Bathing position Bathing activity did not occur: N/A Position: Wheelchair/chair at sink  Bathing parts Body parts bathed by patient: Right arm, Left arm, Chest, Front perineal area, Abdomen, Left upper leg, Right upper leg, Right lower leg, Left lower leg Body parts bathed by helper: Buttocks  Bathing assist Assist Level: Touching or steadying assistance(Pt > 75%)      Upper Body Dressing/Undressing Upper body dressing Upper body dressing/undressing activity did not occur: Refused What is the patient wearing?: Pull over shirt/dress     Pull over shirt/dress - Perfomed by patient: Thread/unthread right sleeve, Thread/unthread left sleeve, Put head through opening, Pull shirt over trunk Pull over shirt/dress - Perfomed by helper: Pull shirt over trunk        Upper body assist Assist Level: Set up   Set up : To obtain clothing/put away  Lower Body Dressing/Undressing Lower body dressing Lower body dressing/undressing activity did not occur: N/A What is the patient wearing?: Pants, Non-skid slipper socks     Pants- Performed by patient: Thread/unthread right pants leg, Thread/unthread left pants leg Pants- Performed by helper: Pull pants up/down Non-skid slipper socks- Performed by patient: Don/doff left  sock, Don/doff right sock Non-skid slipper socks- Performed by helper: Don/doff right sock   Socks - Performed by helper: Don/doff left sock, Don/doff right sock Shoes - Performed by patient: Don/doff right shoe, Don/doff left shoe, Fasten right, Fasten left Shoes - Performed by helper: Don/doff right shoe, Don/doff left shoe, Fasten right, Fasten left        TED Hose - Performed by helper: Don/doff right TED hose, Don/doff left TED hose  Lower body assist Assist for lower body dressing: Touching or steadying assistance (Pt > 75%)      Toileting Toileting Toileting activity did not occur: Refused Toileting steps completed by patient: Adjust clothing prior to toileting, Performs perineal hygiene, Adjust clothing after toileting Toileting steps completed by helper: Adjust clothing prior to toileting, Performs perineal hygiene, Adjust clothing after toileting Toileting Assistive Devices: Grab bar or rail  Toileting assist Assist level: Supervision or verbal cues   Transfers Chair/bed transfer   Chair/bed transfer method: Stand pivot, Ambulatory Chair/bed transfer assist level: Supervision or verbal cues Chair/bed transfer assistive device: Walker, Air cabin crew     Max distance: 133 ft Assist level: Supervision or verbal cues   Wheelchair   Type: Manual Max wheelchair distance: 170 ft Assist Level: Supervision or verbal cues  Cognition Comprehension Comprehension assist level: Follows basic conversation/direction with no assist  Expression Expression assist level: Expresses complex 90% of the time/cues < 10% of the time  Social Interaction Social Interaction assist level: Interacts appropriately 75 - 89% of the time - Needs redirection for appropriate language or to initiate interaction.  Problem Solving Problem solving assist level: Solves basic 75 - 89% of the time/requires cueing 10 - 24% of the time  Memory Memory assist level: Recognizes or recalls 90% of the time/requires cueing < 10% of the time    Medical Problem List and Plan: 1.  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  DVT Prophylaxis/Anticoagulation: SCDs.   Vascular study neg for DVT on 1/11 3. Pain  Management:   Hydrocodone changed to q6 PRN.  Robaxin scheduled 1/11, increased 1/12, changed to PRN on 1/18  Gabapentin 100 TID started 1/12, increased to 300 on 1/14, 400 on 1/17 due to CrCl, decreased  back to 100 TID on 1/18 due to potential interference with cognition  Kpad added 1/12  Lidoderm patch added 1/14 4. Acute blood loss anemia, iron deficiency.    Continue iron supplement, vitamin C added 1/29.   Iron studies reviewed  Hb 8.3 on 1/31   Cont to monitor 5. Neuropsych: This patient is capable of making decisions on his own behalf. 6. Skin/Wound Care: Bilateral buttocks and sacral ulcer. Follow-up WOC 7. Fluids/Electrolytes/Nutrition: Routine I&O 8. Hypertension.   Verapamil 240 mg daily, increased to 360 on 1/12, reduced back to 240 on 1/14  Controlled 1/31 9. CRI stage III.   Cr 1.31 on 1/31, at baseline, pt will need maintain adequate PO intake  Encourage fluid  IV bolus x1 10. Urinary retention.   Flomax started 1/11, increased on 1/25 11. Labile K+:   K+ 3.3 on 1/31, supplemented x2 days.  Cont to monitor 12. Constipation: Resolved  Senna d/ced on 1/18  CXR 1/25, reviewed, some stool noted, otherwise unremarkable   Fiber supplement started   Improving 13. Hyperglycemia  Relatively controlled 1/31 14. Hypoalbuminemia  Supplement initiated 1/11 15. Leukocytosis: Resolved 16. Sleep disturbance  Trazodone PRN  ordered  Improving 17. Acute lower UTI  ucx positive ecoli 50k and pseudomonas 100k  Cipro 1/21-1/24  Repeat neg  LOS (Days) 21 A FACE TO FACE EVALUATION WAS PERFORMED  Yosselin Zoeller [name] 02/03/2016 8:50 AM

## 2016-02-04 ENCOUNTER — Inpatient Hospital Stay (HOSPITAL_COMMUNITY): Payer: Medicare Other | Admitting: Physical Therapy

## 2016-02-04 ENCOUNTER — Inpatient Hospital Stay (HOSPITAL_COMMUNITY): Payer: Medicare Other | Admitting: Occupational Therapy

## 2016-02-04 MED ORDER — SENNOSIDES-DOCUSATE SODIUM 8.6-50 MG PO TABS
1.0000 | ORAL_TABLET | Freq: Every day | ORAL | Status: DC
  Administered 2016-02-05: 1 via ORAL
  Filled 2016-02-04 (×2): qty 1

## 2016-02-04 NOTE — Progress Notes (Signed)
Occupational Therapy Session Note  Patient Details  Name: Marvin Morrison MRN: [id number] Date of Birth: 1940-11-01  Today's Date: 02/04/2016  Session 1 OT Individual Time: 0900-1000 OT Individual Time Calculation (min): 60 min   Session 2 OT Individual Time: 1418-1530 OT Individual Time Calculation (min): 72 min   Short Term Goals: Week 3:  OT Short Term Goal 1 (Week 3): Pt will tolerate 5 minutes of standing activity in preparation for ADL tasks with Mod A OT Short Term Goal 2 (Week 3): Pt will maintain dynamic standing balance with mod A to remove unilateral UE from RW to pull pants over hips OT Short Term Goal 3 (Week 3): Pt will toilet using urinal with set-up A  Skilled Therapeutic Interventions/Progress Updates:  Session 1   1:1 OT session focused on increased independence with bathing/dressing, standing balance/endurance, and home safety modifications. Pt propelled 3wc to the sink and completed grooming tasks in sitting/ UB and LB dressing completed with overall Min A for dynamic standing balance when pulling pants over hips. Pt brought totherapy gym and worked on dynamic standing balance and LB coordination with horse shoe activity. Focus on anterior weight shift w/ sit<>stands and balance strategies. Pt then propelled wc back to room and ambulated 5 feet to access bathroom and transfer onto toilet with min guard A. Verbal cues for safe technique to manage clothing for toileting. Pt left seated on toilet with needs met and call bell within reach.   Session 2 1:1 OT session focused on w/c management, activity tolerance, improved sit<>stand, safety awareness, and simple meal prep from wc level. Pt ambulated 5 feet w/ RW and close supervision to transfer into new w/c rental. Demonstrated w/c functions for breaks and leg rests;pt demonstrated w/c. Pt propelled wc out of room, and stopped at nursing station for pain medication. Addressed improved sit<>stand with focus on anterior weight shift  in preparation for self-care tasks. Pt tolerated 5 sit<>stands w/ overall close supervision and verbal cues for LE positioning. Pt tolerated 2 more sets of 5 sit<>stand with rest breaks in between. Educated pt on kitchen access at [employer] level with focus on positioning of wc and transporting food items. Pt demonstrated understanding and collaborated on simulated meal prep within home environment. Pt propelled wc back to room and ambulated 10 feet from wc to bed w/ RW and close supervision. Pt returned to bed and was left with needs met and call bell within reach.  106/73   Therapy Documentation Precautions:  Precautions Precautions: Fall, Back Precaution Booklet Issued: No Restrictions Weight Bearing Restrictions: No Other Position/Activity Restrictions: Back precautions Pain: Pain Assessment Pain Assessment: 0-10 Pain Score: 7  Pain Type: Neuropathic pain Pain Location: Leg Pain Orientation: Right Pain Descriptors / Indicators: Aching Pain Onset: On-going Pain Intervention(s): Rest;Repositioned ADL: ADL ADL Comments: Please see functional navigator     See Function Navigator for Current Functional Status.   Therapy/Group: Individual Therapy  Valma Cava 02/04/2016, 3:52 PM

## 2016-02-04 NOTE — Progress Notes (Signed)
[place] [facility] MEDICINE & REHABILITATION     PROGRESS NOTE  Subjective/Complaints:  Pt seen laying in bed this AM.  He slept well on/off overnight.  Had discussion with patient regarding safety and being on first floor of his home.  He is amenable to staying on the first floor until it is safer for him to stay on the second floor.   ROS: Denies nausea, vomiting, diarrhea, shortness of breath or chest pain   Objective: Vital Signs: Blood pressure (!) 148/69, pulse 91, temperature 97.7 F (36.5 C), temperature source Oral, resp. rate 17, height 6' (1.829 m), weight 81.5 kg (179 lb 10.8 oz), SpO2 98 %. No results found.  Recent Labs  02/03/16 0458  WBC 7.1  HGB 8.3*  HCT 25.7*  PLT 257    Recent Labs  02/03/16 0458  NA 136  K 3.3*  CL 103  GLUCOSE 95  BUN 19  CREATININE 1.31*  CALCIUM 8.5*   CBG (last 3)  No results for input(s): GLUCAP in the last 72 hours.  Wt Readings from Last 3 Encounters:  02/03/16 81.5 kg (179 lb 10.8 oz)  01/11/16 75.7 kg (166 lb 14.2 oz)  12/08/15 76.2 kg (168 lb)    Physical Exam:  BP (!) 148/69 (BP Location: Left Arm)   Pulse 91   Temp 97.7 F (36.5 C) (Oral)   Resp 17   Ht 6' (1.829 m)   Wt 81.5 kg (179 lb 10.8 oz)   SpO2 98%   BMI 24.37 kg/m  Gen: NAD. Well-developed.  HENT: Normocephalic. Atraumatic. Eyes: EOMI. No discharge. Cardiovascular: RRR. No JVD. Respiratory: CTA bilaterally. Unlabored.  GI: Soft, BS+ Musculoskeletal: He exhibits no edema. No tenderness. Neurological: He is alert.  B/l UE strength 4+/5 B/l LE: 4+/5 HF,KE  LLE ADF 3+/5  RLE ADF 4-/5  Skin:  Scattered abrasions. Psychiatric: He has a normal mood and affect.   Assessment/Plan: 1. 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 Physiatrist is providing close team supervision and 24 hour management of active medical problems listed below. Physiatrist and rehab team continue to assess barriers to discharge/monitor patient progress toward functional and medical goals.  Function:  Bathing Bathing position Bathing activity did not occur: N/A Position: Wheelchair/chair at sink  Bathing parts Body parts bathed by patient: Right arm, Left arm, Chest, Front perineal area, Abdomen, Left upper leg, Right upper leg, Right lower leg, Left lower leg Body parts bathed by helper: Buttocks  Bathing assist Assist Level: Touching or steadying assistance(Pt > 75%)      Upper Body Dressing/Undressing Upper body dressing Upper body dressing/undressing activity did not occur: Refused What is the patient wearing?: Pull over shirt/dress     Pull over shirt/dress - Perfomed by patient: Thread/unthread right sleeve, Thread/unthread left sleeve, Put head through opening, Pull shirt over trunk Pull over shirt/dress - Perfomed by helper: Pull shirt over trunk        Upper body assist Assist Level: Set up   Set up : To obtain clothing/put away  Lower Body Dressing/Undressing Lower body dressing Lower body dressing/undressing activity did not occur: N/A What is the patient wearing?: Pants, Non-skid slipper socks     Pants- Performed by patient: Thread/unthread right pants leg, Thread/unthread left pants leg Pants- Performed by helper: Pull pants up/down Non-skid slipper socks- Performed by patient: Don/doff left sock, Don/doff  right sock Non-skid slipper socks- Performed by helper: Don/doff right sock   Socks - Performed by helper: Don/doff left sock, Don/doff right sock Shoes - Performed by patient: Don/doff right shoe, Don/doff left shoe, Fasten right, Fasten left Shoes - Performed by helper: Don/doff right shoe, Don/doff left shoe, Fasten right, Fasten left       TED Hose - Performed by helper: Don/doff right TED  hose, Don/doff left TED hose  Lower body assist Assist for lower body dressing: Touching or steadying assistance (Pt > 75%)      Toileting Toileting Toileting activity did not occur: Refused Toileting steps completed by patient: Adjust clothing prior to toileting, Performs perineal hygiene, Adjust clothing after toileting Toileting steps completed by helper: Adjust clothing prior to toileting, Performs perineal hygiene, Adjust clothing after toileting Toileting Assistive Devices: Grab bar or rail  Toileting assist Assist level: Supervision or verbal cues   Transfers Chair/bed transfer   Chair/bed transfer method: Stand pivot, Ambulatory Chair/bed transfer assist level: Touching or steadying assistance (Pt > 75%) Chair/bed transfer assistive device: Environmental consultant, Air cabin crew     Max distance: 100 ft Assist level: Touching or steadying assistance (Pt > 75%)   Wheelchair   Type: Manual Max wheelchair distance: 200 Assist Level: Supervision or verbal cues  Cognition Comprehension Comprehension assist level: Understands basic 75 - 89% of the time/ requires cueing 10 - 24% of the time  Expression Expression assist level: Expresses complex 90% of the time/cues < 10% of the time  Social Interaction Social Interaction assist level: Interacts appropriately with others with medication or extra time (anti-anxiety, antidepressant).  Problem Solving Problem solving assist level: Solves complex 90% of the time/cues < 10% of the time  Memory Memory assist level: Recognizes or recalls 90% of the time/requires cueing < 10% of the time    Medical Problem List and Plan: 1.  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  DVT Prophylaxis/Anticoagulation:  SCDs.   Vascular study neg for DVT on 1/11 3. Pain Management:   Hydrocodone changed to q6 PRN.  Robaxin scheduled 1/11, increased 1/12, changed to PRN on 1/18  Gabapentin 100 TID started 1/12, increased to 300 on 1/14, 400 on 1/17 due to CrCl, decreased  back to 100 TID on 1/18 due to potential interference with cognition  Kpad added 1/12  Lidoderm patch added 1/14 4. Acute blood loss anemia, iron deficiency.    Continue iron supplement, vitamin C added 1/29.   Iron studies reviewed  Hb 8.3 on 1/31   Cont to monitor 5. Neuropsych: This patient is capable of making decisions on his own behalf. 6. Skin/Wound Care: Bilateral buttocks and sacral ulcer. Follow-up WOC 7. Fluids/Electrolytes/Nutrition: Routine I&O 8. Hypertension.   Verapamil 240 mg daily, increased to 360 on 1/12, reduced back to 240 on 1/14  Controlled 2/1 9. CRI stage III.   Cr 1.31 on 1/31, at baseline, pt will need maintain adequate PO intake  Encourage fluids  Recheck labs tomorrow 10. Urinary retention.   Flomax started 1/11, increased on 1/25 11. Labile K+:   K+ 3.3 on 1/31, supplemented x2 days.  Recheck labs tomorrow  Cont to monitor 12. Constipation: Resolved  Senna d/ced on 1/18  CXR 1/25, reviewed, some stool noted, otherwise unremarkable   Fiber supplement started   Improving  13. Hyperglycemia  Relatively controlled 2/1 14. Hypoalbuminemia  Supplement initiated 1/11 15. Leukocytosis: Resolved 16. Sleep disturbance: Resolved  Trazodone PRN ordered 17. Acute lower UTI: Resolved  ucx positive ecoli 50k and pseudomonas 100k  Cipro 1/21-1/24  Repeat neg  LOS (Days) 22 A FACE TO FACE EVALUATION WAS PERFORMED  Quadir Muns [name] 02/04/2016 8:55 AM

## 2016-02-04 NOTE — Patient Care Conference (Signed)
Inpatient RehabilitationTeam Conference and Plan of Care Update Date: 02/03/2016   Time: 2:20 PM    Patient Name: Marvin Morrison      Medical Record Number: [id number]  Date of Birth: 19-Aug-1940 Sex: Male         Room/Bed: 4M06C/4M06C-01 Payor Info: Payor: MEDICARE / Plan: MEDICARE PART A AND B / Product Type: *No Product type* /    Admitting Diagnosis: Debility  Admit Date/Time:  01/13/2016  5:19 PM Admission Comments: No comment available   Primary Diagnosis:  <principal problem not specified> Principal Problem: <principal problem not specified>  Patient Active Problem List   Diagnosis Date Noted  . Loose stools   . Hyperkalemia   . Acute lower UTI   . Stage 2 chronic kidney disease   . AKI (acute kidney injury) ([place])   . Constipation   . Sleep disturbance   . Labile blood pressure   . Neuropathic pain   . Benign essential HTN   . Dysuria   . Hypoalbuminemia due to protein-calorie malnutrition ([place])   . Lymphocytosis   . Hyperglycemia   . Radiculopathy 01/13/2016  . Surgery, elective   . Post-operative pain   . Acute blood loss anemia   . Stage 3 chronic kidney disease   . Urinary retention   . Hypokalemia   . Constipation due to pain medication   . Pressure injury of skin 01/12/2016  . Malnutrition of moderate degree 01/12/2016  . Lumbar stenosis 01/11/2016  . Gait disorder 11/18/2015  . Spinal stenosis of lumbar region 10/28/2015  . Nausea with vomiting 04/19/2014  . Loss of weight 05/18/2012  . Well adult exam 05/13/2011  . 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 CRI (chronic renal insufficiency) 07/13/2010  . Anemia due to other cause 04/06/2010  . Anemia, iron deficiency 04/06/2010  . DIARRHEA, CHRONIC 03/27/2009  . 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 Hyperlipidemia 02/01/2008  . ABNORMAL GLUCOSE NEC 02/01/2008  . Weakness 11/19/2007  . SHINGLES 11/17/2007  . Essential hypertension 11/09/2006    Expected Discharge Date: Expected Discharge Date:  02/06/16  Team Members Present: Physician leading conference: Dr. 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  Pt will have more formed stool each bowel movement  Monitoring and treat bowel irregularities.   Swallow/Nutrition/ Hydration             ADL's   Min/Mod A LB ADLs, set-up A  Min A overall  Pt/family ed, LB dressing techniques, functional transfers   Mobility   Sup-Min A overall with Max A needed for ascending/descending regular stairs. Pt can ambulate up to 100 ft with close supervision. neuropathic pain in BLE still presents barrier.  Supervision - Mod I.  Mod I with W/C and bed mobility  bed mobility, transfer, ambulations, STAIRS!!!, activity tolerance, pt education, LE strengthening, discharge planning   Communication             Safety/Cognition/ Behavioral Observations  Pain   Pt continues to request 2 Hydrocodone q6 h for pain.     assess for effectiveness of pain medication   Skin                *See Care Plan and progress notes for long and short-term goals.  Barriers to Discharge: Mobility, safety, hypokalemia, CKD    Possible Resolutions to Barriers:  Encourage fluids, therapies, supplementing K+    Discharge Planning/Teaching Needs:  Wife and daughter coming back today to do hands on care in prepartion for discharge Sat.      Team Discussion:  Progressing  toward his goals and family education completed yesterday. Pt continues to be adamant about going up the stairs at home. PT recommends staying on first floor, not safe with stairs. Medically stable bowel and bladder improved. Preparing for DC 2/3  Revisions to Treatment Plan:  DC 2/3   Continued Need for Acute Rehabilitation Level of Care: The patient requires daily medical management by a physician with specialized training in physical medicine and rehabilitation for the following conditions: Daily direction of a multidisciplinary physical rehabilitation program to ensure safe treatment while eliciting the highest outcome that is of practical value to the patient.: Yes Daily medical management of patient stability for increased activity during participation in an intensive rehabilitation regime.: Yes Daily analysis of laboratory values and/or radiology reports with any subsequent need for medication adjustment of medical intervention for : Post surgical problems;Neurological problems;Renal problems  [name] 02/04/2016, 8:44 AM

## 2016-02-04 NOTE — Progress Notes (Signed)
Social Work [name], LCSW Social Worker Signed   Patient Care Conference Date of Service: 02/04/2016  8:44 AM      Hide copied text Hover for attribution information Inpatient RehabilitationTeam Conference and Plan of Care Update Date: 02/03/2016   Time: 2:20 PM      Patient Name: Marvin Morrison      Medical Record Number: [id number]  Date of Birth: 17-Nov-1940 Sex: Male         Room/Bed: 4M06C/4M06C-01 Payor Info: Payor: MEDICARE / Plan: MEDICARE PART A AND B / Product Type: *No Product type* /     Admitting Diagnosis: Debility  Admit Date/Time:  01/13/2016  5:19 PM Admission Comments: No comment available    Primary Diagnosis:  <principal problem not specified> Principal Problem: <principal problem not specified>       Patient Active Problem List    Diagnosis Date Noted  . Loose stools    . Hyperkalemia    . Acute lower UTI    . Stage 2 chronic kidney disease    . AKI (acute kidney injury) ([place])    . Constipation    . Sleep disturbance    . Labile blood pressure    . Neuropathic pain    . Benign essential HTN    . Dysuria    . Hypoalbuminemia due to protein-calorie malnutrition ([employer])    . Lymphocytosis    . Hyperglycemia    . Radiculopathy 01/13/2016  . Surgery, elective    . Post-operative pain    . Acute blood loss anemia    . Stage 3 chronic kidney disease    . Urinary retention    . Hypokalemia    . Constipation due to pain medication    . Pressure injury of skin 01/12/2016  . Malnutrition of moderate degree 01/12/2016  . Lumbar stenosis 01/11/2016  . Gait disorder 11/18/2015  . Spinal stenosis of lumbar region 10/28/2015  . Nausea with vomiting 04/19/2014  . Loss of weight 05/18/2012  . Well adult exam 05/13/2011  . 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 CRI (chronic renal insufficiency) 07/13/2010  . Anemia due to other cause 04/06/2010  . Anemia, iron deficiency 04/06/2010  . DIARRHEA, CHRONIC 03/27/2009  . 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  Hyperlipidemia 02/01/2008  . ABNORMAL GLUCOSE NEC 02/01/2008  . Weakness 11/19/2007  . SHINGLES 11/17/2007  . Essential hypertension 11/09/2006      Expected Discharge Date: Expected Discharge Date: 02/06/16   Team Members Present: Physician leading conference: Dr. 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  Pt will have more formed stool each bowel movement  Monitoring and treat bowel irregularities.   Swallow/Nutrition/ Hydration               ADL's     Min/Mod A LB ADLs, set-up A  Min A overall  Pt/family ed, LB dressing techniques, functional transfers   Mobility     Sup-Min  A overall with Max A needed for ascending/descending regular stairs. Pt can ambulate up to 100 ft with close supervision. neuropathic pain in BLE still presents barrier.  Supervision - Mod I.  Mod I with W/C and bed mobility  bed mobility, transfer, ambulations, STAIRS!!!, activity tolerance, pt education, LE strengthening, discharge planning   Communication               Safety/Cognition/ Behavioral Observations             Pain     Pt continues to request 2 Hydrocodone q6 h for pain.     assess for effectiveness of pain medication   Skin                 *See Care Plan and progress notes for long and short-term goals.   Barriers to Discharge: Mobility, safety, hypokalemia, CKD     Possible Resolutions to  Barriers:  Encourage fluids, therapies, supplementing K+     Discharge Planning/Teaching Needs:  Wife and daughter coming back today to do hands on care in prepartion for discharge Sat.      Team Discussion:  Progressing toward his goals and family education completed yesterday. Pt continues to be adamant about going up the stairs at home. PT recommends staying on first floor, not safe with stairs. Medically stable bowel and bladder improved. Preparing for DC 2/3  Revisions to Treatment Plan:  DC 2/3    Continued Need for Acute Rehabilitation Level of Care: The patient requires daily medical management by a physician with specialized training in physical medicine and rehabilitation for the following conditions: Daily direction of a multidisciplinary physical rehabilitation program to ensure safe treatment while eliciting the highest outcome that is of practical value to the patient.: Yes Daily medical management of patient stability for increased activity during participation in an intensive rehabilitation regime.: Yes Daily analysis of laboratory values and/or radiology reports with any subsequent need for medication adjustment of medical intervention for : Post surgical problems;Neurological problems;Renal problems   [name] 02/04/2016, 8:44 AM      [name], LCSW Social Worker Signed   Patient Care Conference Date of Service: 01/28/2016  9:16 AM      Hide copied text Hover for attribution information Inpatient RehabilitationTeam Conference and Plan of Care Update Date: 01/27/2016   Time: 2:15 PM      Patient Name: ABDIHAFID LANDMARK      Medical Record Number: [id number]  Date of Birth: 19-Oct-1940 Sex: Male         Room/Bed: 4M06C/4M06C-01 Payor Info: Payor: MEDICARE / Plan: MEDICARE PART A AND B / Product Type: *No Product type* /     Admitting Diagnosis: Debility  Admit Date/Time:  01/13/2016  5:19 PM Admission Comments: No comment available    Primary Diagnosis:   <principal problem not specified> Principal Problem: <principal problem not specified>       Patient Active Problem List    Diagnosis Date Noted  . Acute lower UTI    . Stage 2 chronic kidney disease    . AKI (acute kidney injury) ([place])    . Constipation    . Sleep disturbance    . Labile blood pressure    . Neuropathic pain    . Benign essential HTN    . Dysuria    . Hypoalbuminemia due to protein-calorie malnutrition ([place])    . Lymphocytosis    . Hyperglycemia    . Radiculopathy 01/13/2016  . Surgery,  elective    . Post-operative pain    . Acute blood loss anemia    . Stage 3 chronic kidney disease    . Urinary retention    . Hypokalemia    . Constipation due to pain medication    . Pressure injury of skin 01/12/2016  . Malnutrition of moderate degree 01/12/2016  . Lumbar stenosis 01/11/2016  . Gait disorder 11/18/2015  . Spinal stenosis of lumbar region 10/28/2015  . Nausea with vomiting 04/19/2014  . Loss of weight 05/18/2012  . Well adult exam 05/13/2011  . 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 CRI (chronic renal insufficiency) 07/13/2010  . Anemia due to other cause 04/06/2010  . Anemia, iron deficiency 04/06/2010  . DIARRHEA, CHRONIC 03/27/2009  . 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 Hyperlipidemia 02/01/2008  . ABNORMAL GLUCOSE NEC 02/01/2008  . Weakness 11/19/2007  . SHINGLES 11/17/2007  . Essential hypertension 11/09/2006      Expected Discharge Date: Expected Discharge Date: 02/04/16   Team Members Present: Physician leading conference: Dr. 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 Pt capable of ambulating moderate distances with RW. Continues to be limiited by neuropathic pain in BLE  Min A overall with dynamic standing and W/C mobility being Mod I   bed mobility, transfers, ambulation, activity tolerance, general strengthening, desensitization, safety awareness, pt education,    Communication               Safety/Cognition/ Behavioral Observations             Pain     Pt requests 2 Hydrocodone PRN Q6HR for pain  <4  Assess for effectiveness of pain medication   Skin     Foam to pressure ulcer to buttocks; foam to bilateral heels (cracking)  pt will be free of additional skin breakdown  monitor for signs of additional skin breakdown; Q2HR     *See Care Plan and progress notes for long and short-term goals.   Barriers to Discharge: Mobility, safety, pain, UTI, ABLA, HTN     Possible Resolutions to Barriers:  Encourage fluids, therapies, pain tolerance, abx for UTI     Discharge Planning/Teaching Needs:  Wife can assist will need to come in for education to make sure can provide supervision level      Team Discussion:  Team has downgraded his goals to in assist due to lack of progress and pt's pain issues which limit him in therapies.  MD treating UTI and anemia. RN to initiate bowel program. Need wife to come in to see if can provide min assist level of care. Sacral ulcer healing. Pt has a  perceived perception of pain.  Revisions to Treatment Plan:  Downgraded goals to min assist level    Continued Need for Acute Rehabilitation Level of Care: The patient requires daily medical management by a physician with specialized training in physical medicine and rehabilitation for the following conditions: Daily direction of a multidisciplinary physical rehabilitation program to ensure safe treatment while eliciting the highest outcome that is of practical value to the patient.: Yes Daily medical management of patient stability for increased activity during participation in an intensive rehabilitation regime.: Yes Daily analysis of laboratory values and/or radiology reports with any subsequent need for medication adjustment of medical intervention for : Post surgical problems;Neurological problems;Renal problems;Urological problems   [name] 01/28/2016, 9:16 AM      [name], LCSW Social Worker Signed   Patient Care Conference Date of Service: 01/20/2016  2:45 PM      Hide copied text Hover for attribution information Inpatient RehabilitationTeam Conference and Plan of Care Update Date: 01/20/2016   Time: 2:00 PM      Patient Name: GENERO SUMMERSETT      Medical Record Number: [id number]  Date of Birth: May 07, 1940 Sex: Male         Room/Bed: 4M06C/4M06C-01 Payor Info: Payor: MEDICARE / Plan: MEDICARE PART A AND B / Product Type: *No Product type* /     Admitting Diagnosis: Debility  Admit Date/Time:  01/13/2016  5:19 PM Admission Comments: No comment available    Primary Diagnosis:  <principal problem not specified> Principal Problem: <principal problem not specified>       Patient Active Problem List    Diagnosis Date Noted  . AKI (acute kidney injury) ([place])    . Constipation    . Sleep disturbance    . Labile blood pressure    . Neuropathic pain    . Benign essential HTN    . Dysuria    . Hypoalbuminemia due to protein-calorie malnutrition ([place])    .  Lymphocytosis    . Hyperglycemia    . Radiculopathy 01/13/2016  . Surgery, elective    . Post-operative pain    . Acute blood loss anemia    . Stage 3 chronic kidney disease    . Urinary retention    . Hypokalemia    . Constipation due to pain medication    . Pressure injury of skin 01/12/2016  . Malnutrition of moderate degree 01/12/2016  . Lumbar stenosis 01/11/2016  . Gait disorder 11/18/2015  . Spinal stenosis of lumbar region 10/28/2015  . Nausea with vomiting 04/19/2014  . Loss of weight 05/18/2012  . Well adult exam 05/13/2011  . 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 CRI (chronic renal insufficiency) 07/13/2010  . Anemia due to other cause 04/06/2010  . Anemia, iron deficiency 04/06/2010  . DIARRHEA, CHRONIC 03/27/2009  . 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 Hyperlipidemia 02/01/2008  . ABNORMAL GLUCOSE NEC 02/01/2008  . Weakness 11/19/2007  . SHINGLES 11/17/2007  . Essential hypertension 11/09/2006      Expected Discharge Date: Expected Discharge Date: 02/04/16   Team Members Present: Physician leading conference: Dr. 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 Up to [facility] for BM   Swallow/Nutrition/ Hydration             ADL's   Mod/Max A overall  downgrade goals to Min A overall  pt/family education, functional transfers, modified b/d, ADL AE, back precautions    Mobility   min-max A overall  mod I except  supervision car, min A stairs-plan to downgrade to min A overall at next weekly note  functional mobility training, coordination, standing balance, activity tolerance, strengthening, activity tolerance, safety awareness, pt education   Communication             Safety/Cognition/ Behavioral Observations           Pain   Faces pain scale 2-4/10, Stated pain scale 9/10. 400mg  Neurontin TID, Lidocaine patch q 24hr, Robaxin 750mg  QID, 1-2 Norco q hrs. PRN  <4 on a 0-10 pain scale  assess pain q 4 hours and prn   Skin   2 unstageables (1 to each buttock), Santyl applied daily, moist gauze daily and foam dressing prn. Bilateral heels cracking with foam dressing applied. Honeycomb dressing applied to back incision CDI.  Encourage patient to turn while in bed and boost when in chair. No new skin breakdown while on rehab  Assess skin q shift and prn       *See Care Plan and progress notes for long and short-term goals.   Barriers to Discharge: Mobility, safety, urinary retention/freq, AKI, pain   Possible Resolutions to Barriers:  Encourage fluids, therapies, optimize pain and coping with pain   Discharge Planning/Teaching Needs:  Home with wife who can provide almost 24 hr care does work couple hours per week.      Team Discussion:  Pt's pain is limiting him in therapies-his goals have been downgraded to min assist level. MD working with him on his pain meds and what works the best for him. Checking UA. BP meds adjusting also. Tremoring today. Will ask wife to come in and attend therapies with pt to see how he is progressing.  Revisions to Treatment Plan:  Downgraded goals to min assist level    Continued Need for Acute Rehabilitation Level of Care: The patient requires daily medical management by a physician with specialized training in physical medicine and rehabilitation for the following conditions: Daily direction of a multidisciplinary physical rehabilitation program to ensure safe treatment  while eliciting the highest outcome that is of practical value to the patient.: Yes Daily medical management of patient stability for increased activity during participation in an intensive rehabilitation regime.: Yes Daily analysis of laboratory values and/or radiology reports with any subsequent need for medication adjustment of medical intervention for : Post surgical problems;Neurological problems;Renal problems;Urological problems   [name] 01/20/2016, 2:45 PM       Patient ID: DIANGELO BIBER, male   DOB: 23-Nov-1940, 76 y.o.   MRN: [id number]

## 2016-02-04 NOTE — Progress Notes (Signed)
Physical Therapy Session Note  Patient Details  Name: Marvin Morrison MRN: [id number] Date of Birth: 01-04-1940  Today's Date: 02/04/2016 PT Individual Time: 1002-1100 PT Individual Time Calculation (min): 58 min   Short Term Goals: Week 3:  PT Short Term Goal 1 (Week 3): = LTG due to anticipated LOS  Skilled Therapeutic Interventions/Progress Updates:  Pt was on toilet upon arrival. Pt required min A with donning brief and pants. Ambulatory transfers from toilet and throughout therapy were supervision with min cues for handplacement when sitting down.   Pt propelled in W/C supervision 200 ft x 2; 180 ft; 90 ft during therapy. Pt manuevered over carpet during 90 foot episode.    Pt performed seated and standing exercises. Pt performed seated LAQ with 5 sec holds 1x10; mini squats with RW 2 x 10.   Pt ascended and descended forward on 4 six inch steps with min A. Pt verbalized feeling very tired after stairs. See vitals below. Pt limited with therapy due to pain and exhaustion with constipation episode.   Pt was pushed back to room to return to bed for nursing to assist pt with constipation. Pt required supervision with transfer from W/C to supine in bed. Pt was left in bed with all needs within reach. Nursing was notified.        Therapy Documentation Precautions:  Precautions Precautions: Fall, Back Precaution Booklet Issued: No Restrictions Weight Bearing Restrictions: No Other Position/Activity Restrictions: Back precautions  Vital Signs: Therapy Vitals Pulse Rate: 81 BP: 121/64 Patient Position (if appropriate): Sitting Pain: Pain Assessment Pain Assessment: 0-10 Pain Score: 7  Pain Type: Neuropathic pain Pain Location: Leg Pain Orientation: Right Pain Descriptors / Indicators: Aching Pain Onset: On-going Pain Intervention(s): Rest;Repositioned     See Function Navigator for Current Functional Status.   Therapy/Group: Individual Therapy  Rosendo Gros 02/04/2016, 9:34 AM

## 2016-02-04 NOTE — Plan of Care (Signed)
Problem: RH BOWEL ELIMINATION Goal: RH STG MANAGE BOWEL W/MEDICATION W/ASSISTANCE STG Manage Bowel with Medication with Assistance.  Mod I  Outcome: Progressing Use of laxatives as ordered, increased fiber as ordered (fibercon). Monitoring bowel patterns, encouraging activity and fluids.

## 2016-02-04 NOTE — Progress Notes (Signed)
Pt has not had a bowel movement since the 28th. Pt has had miralax this morning and will monitor and follow up with a suppository, per pt's request if no results today.    Rayann Heman

## 2016-02-05 ENCOUNTER — Inpatient Hospital Stay (HOSPITAL_COMMUNITY): Payer: Medicare Other | Admitting: Occupational Therapy

## 2016-02-05 ENCOUNTER — Inpatient Hospital Stay (HOSPITAL_COMMUNITY): Payer: Medicare Other | Admitting: Physical Therapy

## 2016-02-05 LAB — CBC WITH DIFFERENTIAL/PLATELET
BASOS ABS: 0.1 10*3/uL (ref 0.0–0.1)
Basophils Relative: 1 %
EOS PCT: 6 %
Eosinophils Absolute: 0.5 10*3/uL (ref 0.0–0.7)
HCT: 29.4 % — ABNORMAL LOW (ref 39.0–52.0)
Hemoglobin: 9.5 g/dL — ABNORMAL LOW (ref 13.0–17.0)
Lymphocytes Relative: 24 %
Lymphs Abs: 2 10*3/uL (ref 0.7–4.0)
MCH: 30.5 pg (ref 26.0–34.0)
MCHC: 32.3 g/dL (ref 30.0–36.0)
MCV: 94.5 fL (ref 78.0–100.0)
Monocytes Absolute: 0.8 10*3/uL (ref 0.1–1.0)
Monocytes Relative: 9 %
NEUTROS PCT: 60 %
Neutro Abs: 4.9 10*3/uL (ref 1.7–7.7)
PLATELETS: 250 10*3/uL (ref 150–400)
RBC: 3.11 MIL/uL — AB (ref 4.22–5.81)
RDW: 14.3 % (ref 11.5–15.5)
WBC: 8.1 10*3/uL (ref 4.0–10.5)

## 2016-02-05 LAB — BASIC METABOLIC PANEL
ANION GAP: 10 (ref 5–15)
BUN: 19 mg/dL (ref 6–20)
CO2: 27 mmol/L (ref 22–32)
Calcium: 8.9 mg/dL (ref 8.9–10.3)
Chloride: 100 mmol/L — ABNORMAL LOW (ref 101–111)
Creatinine, Ser: 1.26 mg/dL — ABNORMAL HIGH (ref 0.61–1.24)
GFR, EST NON AFRICAN AMERICAN: 53 mL/min — AB (ref >60)
Glucose, Bld: 92 mg/dL (ref 65–99)
Potassium: 4 mmol/L (ref 3.5–5.1)
SODIUM: 137 mmol/L (ref 135–145)

## 2016-02-05 MED ORDER — PANTOPRAZOLE SODIUM 40 MG PO TBEC: 40.0000 mg | DELAYED_RELEASE_TABLET | Freq: Every day | ORAL | 2 refills | Status: DC

## 2016-02-05 MED ORDER — ONDANSETRON HCL 4 MG PO TABS: 4.0000 mg | ORAL_TABLET | Freq: Every day | ORAL | 0 refills | Status: DC

## 2016-02-05 MED ORDER — METHOCARBAMOL 750 MG PO TABS: 750.0000 mg | ORAL_TABLET | Freq: Four times a day (QID) | ORAL | 0 refills | Status: DC | PRN

## 2016-02-05 MED ORDER — TAMSULOSIN HCL 0.4 MG PO CAPS: 0.4000 mg | ORAL_CAPSULE | Freq: Every day | ORAL | 1 refills | Status: DC

## 2016-02-05 MED ORDER — ADULT MULTIVITAMIN W/MINERALS CH: 1.0000 | ORAL_TABLET | Freq: Every day | ORAL | Status: DC

## 2016-02-05 MED ORDER — LIDOCAINE 5 % EX PTCH
1.0000 | MEDICATED_PATCH | CUTANEOUS | 0 refills | Status: DC
Start: 2016-02-05 — End: 2019-03-27

## 2016-02-05 MED ORDER — ASCORBIC ACID 250 MG PO TABS: 250.0000 mg | ORAL_TABLET | Freq: Three times a day (TID) | ORAL | Status: DC

## 2016-02-05 MED ORDER — VERAPAMIL HCL ER 240 MG PO TBCR: 240.0000 mg | EXTENDED_RELEASE_TABLET | Freq: Every day | ORAL | 2 refills | Status: DC

## 2016-02-05 MED ORDER — GABAPENTIN 100 MG PO CAPS: 100.0000 mg | ORAL_CAPSULE | Freq: Three times a day (TID) | ORAL | 1 refills | Status: DC

## 2016-02-05 MED ORDER — POLYETHYLENE GLYCOL 3350 17 G PO PACK: 17.0000 g | PACK | Freq: Every day | ORAL | 0 refills | Status: DC | PRN

## 2016-02-05 MED ORDER — ACYCLOVIR 400 MG PO TABS: 400.0000 mg | ORAL_TABLET | Freq: Two times a day (BID) | ORAL | 11 refills | Status: DC

## 2016-02-05 MED ORDER — HYDROCODONE-ACETAMINOPHEN 5-325 MG PO TABS: 1.0000 | ORAL_TABLET | Freq: Four times a day (QID) | ORAL | 0 refills | Status: DC | PRN

## 2016-02-05 MED ORDER — FERROUS SULFATE 325 (65 FE) MG PO TABS: 325.0000 mg | ORAL_TABLET | Freq: Three times a day (TID) | ORAL | 3 refills | Status: DC

## 2016-02-05 NOTE — Discharge Instructions (Signed)
Inpatient Rehab Discharge Instructions  Marvin Morrison Discharge date and time: No discharge date for patient encounter.   Activities/Precautions/ Functional Status: Activity: activity as tolerated Diet: regular diet Wound Care: Foam dressing to bilateral buttocks change every 3 days as needed Functional status:  ___ No restrictions     ___ Walk up steps independently ___ 24/7 supervision/assistance   ___ Walk up steps with assistance ___ Intermittent supervision/assistance  ___ Bathe/dress independently ___ Walk with walker     _x__ Bathe/dress with assistance ___ Walk Independently    ___ Shower independently ___ Walk with assistance    ___ Shower with assistance ___ No alcohol     ___ Return to work/school ________  Special Instructions: No driving  Foam dressing bilateral buttocks  home health nurse change every 3 days as needed  COMMUNITY REFERRALS UPON DISCHARGE:    Home Health:   PT, OT, RN  [place]   Date of last service:02/06/2016  Medical Equipment/Items Ordered:WHEELCHAIR, 3 IN 1, TUB SEAT  Agency/Supplier:[facility]   [phone]    My questions have been answered and I understand these instructions. I will adhere to these goals and the provided educational materials after my discharge from the hospital.  Patient/Caregiver Signature _______________________________ Date __________  Clinician Signature _______________________________________ Date __________  Please bring this form and your medication list with you to all your follow-up doctor's appointments.

## 2016-02-05 NOTE — Progress Notes (Signed)
Physical Therapy Discharge Summary  Patient Details  Name: Marvin Morrison MRN: [id number] Date of Birth: 1940-01-18  Today's Date: 02/06/2016 PT Individual Time: 1100-1201 and 1400-1508  61 min  and 68 min  Patient has met 9 of 9 long term goals due to improved activity tolerance, increased strength, decreased pain, ability to compensate for deficits, functional use of  right lower extremity and left lower extremity.  Patient to discharge at an ambulatory level Supervision.   Patient's care partner is independent to provide the necessary physical assistance at discharge.  Reasons goals not met: N/A  Recommendation:  Patient will benefit from ongoing skilled PT services in home health setting to continue to advance safe functional mobility, address ongoing impairments in LE strength, pain, endurance, balance, and minimize fall risk.  Equipment: W/C; pt owns RW  Reasons for discharge: treatment goals met and discharge from hospital  Patient/family agrees with progress made and goals achieved: Yes     Skilled Therapeutic Intervention Treatment 1: Pt demonstrated stand pivot transfers, ambulation, car transfers at sedan height with RW at supervision level with min cues for hand placement with car transfer. Pt demonstrated ascending and descending forward 12 steps with BUE support and close supervision. Patient required multiple prolonged seated rest breaks throughout session due to fatigue and pain.   Treatment 2:  Pt demonstrated managing curbs, stairs with 1 person stabilizing RW in front and 1 person providing supervision behind patient, bed mobility using step in ADL apartment for elevated bed height, ambulatory transfers, and gait with RW supervision overall. Pt demonstrated TUG and 10 M walk tests with Rw, see outcomes below. Daughter and wife were present for W/C management education and reviewed HEP handout and recommendations to perform standing exercises with supervision from family,  patient and family verbalized understanding and in agreement. Patient and family with no further questions/concerns regarding discharge home planned tomorrow.   PT Discharge Precautions/Restrictions Precautions Precautions: Fall;Back Restrictions Weight Bearing Restrictions: No Pain  8/10 pain, RN notified and administered pain medication Vision/Perception   baseline Vision/History: wears glasses Wears Glasses: distance only Patient visual report: No change from baseline Vision Assessment: No apparent visual deficits Cognition Overall Cognitive Status: Within Functional Limits for tasks assessed Arousal/Alertness: Awake/alert Orientation Level: Oriented X4 Memory: Appears intact Safety/Judgment: Impaired Comments: poor mental flexibility Sensation Sensation Light Touch: Impaired Detail Light Touch Impaired Details: Impaired RLE;Impaired LLE Proprioception: Impaired by gross assessment Proprioception Impaired Details: Impaired RLE;Impaired LLE Additional Comments: Pt demonstrated absent to light touch and pain to deep pressure bilateral feet.  Coordination Gross Motor Movements are Fluid and Coordinated: No Fine Motor Movements are Fluid and Coordinated: No Motor  Motor Motor: Abnormal postural alignment and control  Mobility Bed Mobility Bed Mobility: Supine to Sit;Rolling Left;Rolling Right;Sit to Supine;Sit to Sidelying Right;Sit to Sidelying Left;Left Sidelying to Sit;Right Sidelying to Sit Rolling Right: 5: Supervision Rolling Left: 5: Supervision Right Sidelying to Sit: 5: Supervision Supine to Sit: 5: Supervision Sit to Supine: 5: Supervision Transfers Stand Pivot Transfers: 5: Supervision Locomotion  Ambulation Ambulation: Yes Ambulation/Gait Assistance: 5: Supervision Ambulation Distance (Feet): 120 Feet Assistive device: Rolling walker Gait Gait: Yes Gait Pattern: Impaired Gait Pattern: Decreased stride length;Poor foot clearance - left;Poor foot  clearance - right;Decreased dorsiflexion - right;Decreased dorsiflexion - left;Step-through pattern;Narrow base of support;Decreased trunk rotation;Trunk flexed;Ataxic;Scissoring Gait velocity: 0.77 m/s Stairs / Additional Locomotion Stairs: Yes Stairs Assistance: 4: Min assist Stair Management Technique: Two rails;Step to pattern Height of Stairs: 3 Ramp: 5: Supervision Curb: 5: Supervision  Architect: Yes Wheelchair Assistance: 5: Investment banker, operational Details: Verbal cues for technique;Verbal cues for Astronomer: Both upper extremities Wheelchair Parts Management: Supervision/cueing  Trunk/Postural Assessment  Cervical Assessment Cervical Assessment: Exceptions to [facility] (forward head position) Thoracic Assessment Thoracic Assessment: Exceptions to [facility] (increased kyophotic posture) Lumbar Assessment Lumbar Assessment: Exceptions to [facility] (increased lumbar flexion; posteriorly tilted pelvis) Postural Control Postural Control: Deficits on evaluation  Balance Balance Balance Assessed: Yes Standardized Balance Assessment Standardized Balance Assessment: Timed Up and Go Test Timed Up and Go Test TUG: Normal TUG Normal TUG (seconds): 19 Static Standing Balance Static Standing - Balance Support: Bilateral upper extremity supported Static Standing - Level of Assistance: 5: Stand by assistance Extremity Assessment  RLE Assessment RLE Assessment: Within Functional Limits RLE Strength RLE Overall Strength: Within Functional Limits for tasks assessed RLE Overall Strength Comments: hip flexion 4+/5, knee ext 5/5; knee flexionand PF 4/5; DF 3/5 LLE Assessment LLE Assessment: Within Functional Limits LLE Strength LLE Overall Strength: Within Functional Limits for tasks assessed LLE Overall Strength Comments: hip flexion 4+/5 ; knee ext 5/5; knee flexion and ankle PF 4/5; ankle DF 3/5   See Function Navigator for Current  Functional Status.  Rosendo Gros 02/06/2016, 8:04 AM

## 2016-02-05 NOTE — Progress Notes (Signed)
Social Work  Discharge Note  The overall goal for the admission was met for:   Discharge location: Yes-HOME WITH WIFE WHO IS PLANNING ON TAKING TIME OFF WORK TO BE THERE WITH HIM.  Length of Stay: Yes-24 DAYS  Discharge activity level: Yes-SUPERVISION-SOME MIN ASSIST LEVEL  Home/community participation: Yes  Services provided included: MD, RD, PT, OT, RN, CM, TR, Pharmacy and SW  Financial Services: Medicare  Follow-up services arranged: Home Health: [facility]-PT,OT,RN, DME: [facility]-WHEELCHAIR, 3IN1, TUB SEAT and Patient/Family has no preference for HH/DME agencies  Comments (or additional information):WIFE [place]. PT HAS HIS OWN WAY OF DOING THINGS AND STUBBORN REGARDING THE RECOMMENDATION OF STAYING ON THE FIRST FLOOR UNTIL ABLE TO DO STEPS BETTER. PT FEELS CAN CRAWL UP STAIRS OR BE CARRIED BY SON IN-LAW-WHICH BOTH ARE NOT FEASIBLE. MD HAS SPOKEN WITH HIM REGARDING THIS.   Patient/Family verbalized understanding of follow-up arrangements: Yes  Individual responsible for coordination of the follow-up plan: SELF & PAM-WIFE  Confirmed correct DME delivered: [name] 02/05/2016    [name]

## 2016-02-05 NOTE — Progress Notes (Signed)
[facility]     PROGRESS NOTE  Subjective/Complaints:  Constipated yesterday, required suppository  ROS: Denies nausea, vomiting, diarrhea, shortness of breath or chest pain   Objective: Vital Signs: Blood pressure (!) 109/55, pulse 72, temperature 98.1 F (36.7 C), temperature source Oral, resp. rate 19, height 6' (1.829 m), weight 81.5 kg (179 lb 10.8 oz), SpO2 98 %. No results found.  Recent Labs  02/03/16 0458 02/05/16 0506  WBC 7.1 8.1  HGB 8.3* 9.5*  HCT 25.7* 29.4*  PLT 257 250    Recent Labs  02/03/16 0458 02/05/16 0506  NA 136 137  K 3.3* 4.0  CL 103 100*  GLUCOSE 95 92  BUN 19 19  CREATININE 1.31* 1.26*  CALCIUM 8.5* 8.9   CBG (last 3)  No results for input(s): GLUCAP in the last 72 hours.  Wt Readings from Last 3 Encounters:  02/03/16 81.5 kg (179 lb 10.8 oz)  01/11/16 75.7 kg (166 lb 14.2 oz)  12/08/15 76.2 kg (168 lb)    Physical Exam:  BP (!) 109/55 (BP Location: Right Arm)   Pulse 72   Temp 98.1 F (36.7 C) (Oral)   Resp 19   Ht 6' (1.829 m)   Wt 81.5 kg (179 lb 10.8 oz)   SpO2 98%   BMI 24.37 kg/m  Gen: NAD. Well-developed.  HENT: Normocephalic. Atraumatic. Eyes: EOMI. No discharge. Cardiovascular: RRR. No JVD. Respiratory: CTA bilaterally. Unlabored.  GI: Soft, BS+ Musculoskeletal: He exhibits no edema. No tenderness. Neurological: He is alert.  B/l UE strength 4+/5 B/l LE: 4+/5 HF,KE  LLE ADF 4-/5  RLE ADF 4-/5  Skin:  Scattered abrasions. Psychiatric: He has a normal mood and affect.   Assessment/Plan: 1. 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-S1 HNP status post L2-5 decompression/discectomy  Cont to require CIR level , working on D/C planned for am Function:  Bathing Bathing position Bathing activity did not occur: Refused Position: Wheelchair/chair at sink  Bathing parts Body parts bathed by patient: Right arm,  Left arm, Chest, Front perineal area, Abdomen, Left upper leg, Right upper leg, Right lower leg, Left lower leg Body parts bathed by helper: Buttocks  Bathing assist Assist Level: Touching or steadying assistance(Pt > 75%)      Upper Body Dressing/Undressing Upper body dressing Upper body dressing/undressing activity did not occur: Refused What is the patient wearing?: Pull over shirt/dress     Pull over shirt/dress - Perfomed by patient: Thread/unthread right sleeve, Thread/unthread left sleeve, Put head through opening, Pull shirt over trunk Pull over shirt/dress - Perfomed by helper: Pull shirt over trunk        Upper body assist Assist Level: Set up   Set up : To obtain clothing/put away  Lower Body Dressing/Undressing Lower body dressing Lower body dressing/undressing activity did not occur: N/A What is the patient wearing?: Pants, Non-skid slipper socks     Pants- Performed by patient: Thread/unthread left pants leg, Thread/unthread right pants leg Pants- Performed by helper: Pull pants up/down Non-skid slipper socks- Performed by patient: Don/doff left sock, Don/doff right sock Non-skid slipper socks- Performed by helper: Don/doff right sock   Socks - Performed by helper: Don/doff left sock, Don/doff right sock Shoes - Performed by patient: Don/doff right shoe, Don/doff left shoe, Fasten right, Fasten left Shoes - Performed by helper: Don/doff right shoe, Don/doff left shoe, Fasten right, Fasten left       TED Hose - Performed by helper: Don/doff right  TED hose, Don/doff left TED hose  Lower body assist Assist for lower body dressing: Touching or steadying assistance (Pt > 75%)      Toileting Toileting Toileting activity did not occur: Refused Toileting steps completed by patient: Performs perineal hygiene, Adjust clothing prior to toileting Toileting steps completed by helper: Adjust clothing after toileting Toileting Assistive Devices: Grab bar or rail  Toileting  assist Assist level: Touching or steadying assistance (Pt.75%)   Transfers Chair/bed transfer   Chair/bed transfer method: Ambulatory Chair/bed transfer assist level: Touching or steadying assistance (Pt > 75%) Chair/bed transfer assistive device: Walker, Air cabin crew     Max distance: 100 ft Assist level: Touching or steadying assistance (Pt > 75%)   Wheelchair   Type: Manual Max wheelchair distance: 200 Assist Level: Supervision or verbal cues  Cognition Comprehension Comprehension assist level: Follows complex conversation/direction with extra time/assistive device  Expression Expression assist level: Expresses complex 90% of the time/cues < 10% of the time  Social Interaction Social Interaction assist level: Interacts appropriately 90% of the time - Needs monitoring or encouragement for participation or interaction.  Problem Solving Problem solving assist level: Solves basic problems with no assist  Memory Memory assist level: Recognizes or recalls 90% of the time/requires cueing < 10% of the time    Medical Problem List and Plan: 1.  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  DVT Prophylaxis/Anticoagulation: SCDs.   Vascular study neg for DVT on 1/11 3. Pain Management:   Hydrocodone changed to q6 PRN.  Robaxin scheduled 1/11, increased 1/12, changed to PRN on 1/18  Gabapentin 100 TID started 1/12, increased to 300 on 1/14, 400 on 1/17 due to CrCl, decreased  back to 100 TID on 1/18 due to potential interference with cognition  Kpad added 1/12  Lidoderm patch added 1/14 4. Acute blood loss anemia, iron deficiency.    Continue iron supplement, vitamin C added 1/29.   Iron studies reviewed  Hb 8.3 on 1/31   Cont to monitor 5. Neuropsych: This patient is capable of making decisions on his own behalf. 6.  Skin/Wound Care: Bilateral buttocks and sacral ulcer. Follow-up WOC 7. Fluids/Electrolytes/Nutrition: Routine I&O 8. Hypertension.   Verapamil 240 mg daily, increased to 360 on 1/12, reduced back to 240 on 1/14  Controlled 2/1 9. CRI stage III.   Cr 1.31 on 1/31,stable 1.26 10. Urinary retention.   Flomax started 1/11, increased on 1/25 11. K+ normal after supplementation:     Cont to monitor 12. Constipation: Resolved  Senna d/ced on 1/18  CXR 1/25, reviewed, some stool noted, otherwise unremarkable   Fiber supplement started   Improving 13. Hyperglycemia  CBG (last 3)   14. Hypoalbuminemia  Supplement initiated 1/11 15. Leukocytosis: Resolved 16. Sleep disturbance: Resolved  Trazodone PRN ordered 17. Acute lower UTI: Resolved  ucx positive ecoli 50k and pseudomonas 100k  Cipro 1/21-1/24  Repeat neg  LOS (Days) 23 A FACE TO FACE EVALUATION WAS PERFORMED  Charlett Blake 02/05/2016 6:27 AM

## 2016-02-05 NOTE — Plan of Care (Signed)
Problem: RH BOWEL ELIMINATION Goal: RH STG MANAGE BOWEL WITH ASSISTANCE STG Manage Bowel with Assistance. Mod I   Outcome: Progressing Pt will anticipate need to have bm and call for assistance to BR or bedpan in time to prevent incontinent episode. Pt will express need for bowel interventions for both constipation and diarrhea.

## 2016-02-05 NOTE — Plan of Care (Signed)
Problem: RH SKIN INTEGRITY Goal: RH STG MAINTAIN SKIN INTEGRITY WITH ASSISTANCE STG Maintain Skin Integrity With Assistance.  Min A   Outcome: Progressing Maintain skin protection including barrier cream and Allevyn to sacral area, specialty pressure-free mattress/hospital bed. Monitor skin for moisture and breakdown, encourage movement and repositioning.

## 2016-02-05 NOTE — Discharge Summary (Signed)
Discharge summary job [id number]

## 2016-02-05 NOTE — Progress Notes (Signed)
Occupational Therapy Discharge Summary  Patient Details  Name: Marvin Morrison MRN: [id number] Date of Birth: February 13, 1940  Today's Date: 02/05/2016 OT Individual Time: 0900-1014 OT Individual Time Calculation (min): 74 min    Patient has met 8 of 8 long term goals due to improved activity tolerance, improved balance, postural control, ability to compensate for deficits and improved coordination.  Patient to discharge at [facility] level.  Patient's care partner is independent to provide the necessary physical assistance at discharge.    Reasons goals not met: n/a  Recommendation:  Patient will benefit from ongoing skilled OT services in home health setting to continue to advance functional skills in the area of BADL and Reduce care partner burden.  Equipment: w/c, 3-in-1 BSC, shower chair  Reasons for discharge: treatment goals met and discharge from hospital  Patient/family agrees with progress made and goals achieved: Yes  OT Treatment session OT session focused on functional toilet/shower transfers, toileting, and modified bathing/dressing. Pt complete stand-step turn transfer w/ RW and close supervision. Ambulated 5 feet into bathroom w/ RW and min guard A. Pt able to manage clothing for toileting, but required Min A for thoroughness after small Bm. Pt then ambulated to transfer into walk-in shower with Rw, close supervision, and min guard A when turning. Pt able to utilize ling-handled sponge for LB bathing, but required min A to wash buttocks. Dressing completed wc level at the sink with overall supervision and verbal cues for safe technique when reaching to pull pants over hips. Discussed ADL participation within home environment-pt verbalized understanding and left seated in wc at end of session with needs met.   OT Discharge Precautions/Restrictions  Precautions Precautions: Fall;Back Required Braces or Orthoses:  (no brace) Restrictions Weight Bearing  Restrictions: No Other Position/Activity Restrictions: Back precautions Pain Pain Assessment Pain Assessment: 0-10 Pain Score: 5  Pain Type: Neuropathic pain Pain Location: Leg Pain Orientation: Right Pain Descriptors / Indicators: Aching Pain Onset: On-going Pain Intervention(s): Repositioned ADL ADL Grooming: Modified independent Where Assessed-Grooming: Sitting at sink Upper Body Bathing: Supervision/safety, Setup Where Assessed-Upper Body Bathing: Shower Lower Body Bathing: Minimal assistance Where Assessed-Lower Body Bathing: Shower Upper Body Dressing: Modified independent (Device) Where Assessed-Upper Body Dressing: Wheelchair Lower Body Dressing: Supervision/safety Where Assessed-Lower Body Dressing: Wheelchair Toileting: Minimal assistance Where Assessed-Toileting: Glass blower/designer: Close supervision Toilet Transfer Method: Counselling psychologist: Raised toilet seat Social research officer, government: Environmental education officer Method: Print production planner with back ADL Comments: Please see functional navigator Cognition Overall Cognitive Status: Within Functional Limits for tasks assessed Arousal/Alertness: Awake/alert Orientation Level: Oriented X4 Memory: Appears intact Safety/Judgment: Impaired Sensation Sensation Light Touch: Impaired by gross assessment Light Touch Impaired Details: Impaired RLE;Impaired LLE Motor  Motor Motor: Abnormal postural alignment and control Balance Normal TUG (seconds): 19 Static Standing Balance Static Standing - Balance Support: Bilateral upper extremity supported Static Standing - Level of Assistance: 5: Stand by assistance Extremity/Trunk Assessment RUE Assessment RUE Assessment: Within Functional Limits LUE Assessment LUE Assessment: Within Functional Limits  See Function Navigator for Current Functional Status.  Daneen Schick Estrella Alcaraz 02/05/2016, 4:09 PM

## 2016-02-06 DIAGNOSIS — M48061 Spinal stenosis, lumbar region without neurogenic claudication: Secondary | ICD-10-CM

## 2016-02-06 NOTE — Progress Notes (Signed)
[facility] PHYSICAL MEDICINE & REHABILITATION     PROGRESS NOTE  Subjective/Complaints:  Received D/C instructions yesterday, no pain c/os Bowels and bladder working well  ROS: Denies nausea, vomiting, diarrhea, shortness of breath or chest pain   Objective: Vital Signs: Blood pressure 108/62, pulse 72, temperature 97.9 F (36.6 C), temperature source Oral, resp. rate 18, height 6' (1.829 m), weight 81.5 kg (179 lb 10.8 oz), SpO2 98 %. No results found.  Recent Labs  02/05/16 0506  WBC 8.1  HGB 9.5*  HCT 29.4*  PLT 250    Recent Labs  02/05/16 0506  NA 137  K 4.0  CL 100*  GLUCOSE 92  BUN 19  CREATININE 1.26*  CALCIUM 8.9   CBG (last 3)  No results for input(s): GLUCAP in the last 72 hours.  Wt Readings from Last 3 Encounters:  02/03/16 81.5 kg (179 lb 10.8 oz)  01/11/16 75.7 kg (166 lb 14.2 oz)  12/08/15 76.2 kg (168 lb)    Physical Exam:  BP 108/62 (BP Location: Left Arm)   Pulse 72   Temp 97.9 F (36.6 C) (Oral)   Resp 18   Ht 6' (1.829 m)   Wt 81.5 kg (179 lb 10.8 oz)   SpO2 98%   BMI 24.37 kg/m  Gen: NAD. Well-developed.  HENT: Normocephalic. Atraumatic. Eyes: EOMI. No discharge. Cardiovascular: RRR. No JVD. Respiratory: CTA bilaterally. Unlabored.  GI: Soft, BS+ Musculoskeletal: He exhibits no edema. No tenderness. Neurological: He is alert.  B/l UE strength 5/5 B/l LE: 4+/5 HF,KE  LLE ADF 4-/5  RLE ADF 4-/5  Skin:  Scattered abrasions. Psychiatric: He has a normal mood and affect.   Assessment/Plan: 1. 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-S1 HNP status post L2-5 decompression/discectomy  Stable for D/C today F/u PCP in 3-4 weeks F/u PM&R 2 weeks See D/C summary See D/C instructions Function:  Bathing Bathing position Bathing activity did not occur: Refused Position: Shower  Bathing parts Body parts bathed by patient: Right arm, Chest, Left arm, Abdomen,  Front perineal area, Right upper leg, Left upper leg, Right lower leg, Left lower leg Body parts bathed by helper: Buttocks  Bathing assist Assist Level: Touching or steadying assistance(Pt > 75%)      Upper Body Dressing/Undressing Upper body dressing Upper body dressing/undressing activity did not occur: Refused What is the patient wearing?: Pull over shirt/dress     Pull over shirt/dress - Perfomed by patient: Thread/unthread right sleeve, Thread/unthread left sleeve, Put head through opening, Pull shirt over trunk Pull over shirt/dress - Perfomed by helper: Pull shirt over trunk        Upper body assist Assist Level: No help, No cues   Set up : To obtain clothing/put away  Lower Body Dressing/Undressing Lower body dressing Lower body dressing/undressing activity did not occur: N/A What is the patient wearing?: Pants, Non-skid slipper socks     Pants- Performed by patient: Pull pants up/down Pants- Performed by helper: Thread/unthread left pants leg, Thread/unthread right pants leg Non-skid slipper socks- Performed by patient: Don/doff right sock, Don/doff left sock Non-skid slipper socks- Performed by helper: Don/doff right sock   Socks - Performed by helper: Don/doff left sock, Don/doff right sock Shoes - Performed by patient: Don/doff right shoe, Don/doff left shoe, Fasten right, Fasten left Shoes - Performed by helper: Don/doff right shoe, Don/doff left shoe, Fasten right, Fasten left       TED Hose - Performed by helper: Don/doff right TED hose,  Don/doff left TED hose  Lower body assist Assist for lower body dressing: Supervision or verbal cues      Toileting Toileting Toileting activity did not occur: Refused Toileting steps completed by patient: Adjust clothing prior to toileting, Adjust clothing after toileting Toileting steps completed by helper: Performs perineal hygiene Toileting Assistive Devices: Grab bar or rail  Toileting assist Assist level: Touching or  steadying assistance (Pt.75%)   Transfers Chair/bed transfer   Chair/bed transfer method: Ambulatory Chair/bed transfer assist level: Supervision or verbal cues Chair/bed transfer assistive device: Walker, Air cabin crew     Max distance: 120 ft Assist level: Supervision or verbal cues   Wheelchair   Type: Manual Max wheelchair distance: 200 Assist Level: Supervision or verbal cues  Cognition Comprehension Comprehension assist level: Follows complex conversation/direction with extra time/assistive device  Expression Expression assist level: Expresses complex 90% of the time/cues < 10% of the time  Social Interaction Social Interaction assist level: Interacts appropriately 75 - 89% of the time - Needs redirection for appropriate language or to initiate interaction.  Problem Solving Problem solving assist level: Solves basic problems with no assist  Memory Memory assist level: Recognizes or recalls 90% of the time/requires cueing < 10% of the time    Medical Problem List and Plan: 1.  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  DVT Prophylaxis/Anticoagulation: SCDs.   Vascular study neg for DVT on 1/11 3. Pain Management:   Hydrocodone changed to q6 PRN.  Robaxin scheduled 1/11, increased 1/12, changed to PRN on 1/18  Gabapentin 100 TID started 1/12, increased to 300 on 1/14, 400 on 1/17 due to CrCl, decreased  back to 100 TID on 1/18 due to potential interference with cognition  Kpad added 1/12  Lidoderm patch added 1/14 4. Acute blood loss anemia, iron deficiency.    Continue iron supplement, vitamin C added 1/29.   Iron studies reviewed  Hb 8.3 on 1/31   Cont to monitor 5. Neuropsych: This patient is capable of making decisions on his own behalf. 6. Skin/Wound Care: Bilateral buttocks and sacral ulcer. Follow-up  WOC 7. Fluids/Electrolytes/Nutrition: Routine I&O 8. Hypertension.   Verapamil 240 mg daily, increased to 360 on 1/12, reduced back to 240 on 1/14  Controlled 2/1 9. CRI stage III.   Cr 1.31 on 1/31,stable 1.26 10. Urinary retention.   Flomax started 1/11, increased on 1/25 11. K+ normal after supplementation:     Cont to monitor 12. Constipation: Resolved  Senna d/ced on 1/18  CXR 1/25, reviewed, some stool noted, otherwise unremarkable   Fiber supplement started   Improving 13. Hyperglycemia  CBG (last 3)   14. Hypoalbuminemia  Supplement initiated 1/11   LOS (Days) 24 A FACE TO FACE EVALUATION WAS PERFORMED  Charlett Blake 02/06/2016 6:46 AM

## 2016-02-06 NOTE — Discharge Summary (Signed)
NAME:  Marvin Morrison, Marvin Morrison NO.:  [id number]  MEDICAL RECORD NO.:  [id number]  LOCATION:  [id number]                        FACILITY:  [facility]  PHYSICIAN:  Delice Lesch, MD        DATE OF BIRTH:  August 14, 1940  DATE OF ADMISSION:  01/13/2016 DATE OF DISCHARGE:  02/06/2016                              DISCHARGE SUMMARY   DISCHARGE DIAGNOSES: 1. Lumbar spondylosis, stenosis with neurogenic claudication and     radiculopathy at L5-S1. 2. SCDs for deep venous thrombosis prophylaxis. 3. Pain management. 4. Acute blood loss anemia. 5. Hypertension. 6. Chronic renal insufficiency, stage III. 7. Urinary retention, improved. 8. Constipation. 9. Decreased nutritional storage. 10.Leukocytosis, resolved. 11.Sleep disturbance, resolved. 12.Pseudomonas E. coli urinary tract infection.  HISTORY OF PRESENT ILLNESS:  This is a 76 year old right-handed male with history of hypertension, chronic renal insufficiency, creatinine 1.68.  Lives with his wife.  Essentially wheelchair bed bound for the past few months with lower extremity weakness and back pain.  His wife works during the day.  He presented on 01/11/2016, after recent MRI demonstrated advanced spondylosis at multiple levels with severe stenosis and radiculopathy at L4-5, L3-4, and L2-3 with disk herniation at L5-S1.  No change with conservative care.  Underwent laminectomy at L2-L5 with decompression, diskectomy, L5-S1, 01/11/2016, per Dr. Ellene Route. Hospital course, pain management.  Wound Care nurse followup for skin care of decubitus ulcer.  No plan for back bracing.  Acute blood loss anemia 9.1 and monitored.  Bouts of urinary retention, requiring intermittent catheterization early on hospital course.  Urinalysis negative.  Hypokalemia 2.7 with potassium supplement added.  Physical and Occupational Therapy ongoing.  The patient was admitted for comprehensive rehab program.  PAST MEDICAL HISTORY:  See discharge  diagnoses.  SOCIAL HISTORY:  Lives with wife.  Essentially wheelchair bed bound for the past few months.  Functional status upon admission to rehab services.  He would do computer work from his room using a walker to stand and ambulate for short distances.  Min-mod assist overall.  PHYSICAL EXAMINATION:  VITAL SIGNS:  Blood pressure 152/90, pulse 95, temperature 98, respirations 20. GENERAL:  This was an alert male, in no acute distress.  Pupils round and reactive to light. CARDIAC:  Regular rate and rhythm without murmur. ABDOMEN:  Soft, nontender.  Good bowel sounds. LUNGS:  Clear to auscultation without wheeze. EXTREMITIES:  Bilateral upper extremities 4+/5, 4+/5 hip flexors, 4-/5 knee extension.  5 x 2 x 1 cm stage II ulcer on left and right buttocks. No odor.  REHABILITATION HOSPITAL COURSE:  The patient was admitted to inpatient rehab services with therapies initiated on a 3-hour daily basis consisting of physical therapy, occupational therapy, and rehabilitation nursing.  The following issues were addressed during the patient's rehabilitation stay.  Pertaining to Mr. Vradenburg neurogenic claudication and radiculopathy, he had undergone decompression diskectomy L2-L5.  He would follow up with Neurosurgery, Dr. Ellene Route.  No back bracing was required.  SCDs for DVT prophylaxis.  Venous Doppler studies negative. Pain management with the use of Neurontin 100 mg 3 times daily as well as hydrocodone, Robaxin as needed for pain.  Acute blood loss anemia, latest hemoglobin 8.3,  remained stable on iron supplement.  Blood pressures controlled on verapamil 240 mg daily.  Monitored closely.  He would follow up with his primary MD.  Chronic renal insufficiency, stage III.  Creatinine 1.31 at baseline.  Again follow up with primary MD.  He had been placed on Flomax for some urinary retention.  Voiding greatly improved.  Bouts of hypokalemia, resolved with potassium supplement. Constipation,  resolved with bowel program.  His appetite continued to improve.  He did complete a course of Cipro for E. coli Pseudomonas UTI. He remained afebrile.  The patient received weekly collaborative interdisciplinary team conferences to discuss estimated length of stay, family teaching, any barriers to discharge.  He could propel his wheelchair with supervision.  He did mini squats with a rolling walker x2.  Performed seated and standing exercises with minimal assist.  Went up and forward and down backwards 2 stairs.  Contact guard assist. Ambulate contact guard 100 feet.  Supervision for car transfers.  He could gather his belongings for activities of daily living and homemaking.  Full family teaching was completed and plan discharge to home.  DISCHARGE MEDICATIONS:  Included: 1. Zovirax 400 mg p.o. b.i.d. 2. Ferrous sulfate 325 mg t.i.d. 3. Neurontin 100 mg p.o. t.i.d. 4. Lidoderm patch daily. 5. Multivitamin 1 tablet daily. 6. Protonix 40 mg p.o. daily. 7. Zofran 4 mg p.o. daily. 8. Senokot-S tablets 1 p.o. at bedtime. 9. Flomax 0.4 mg p.o. daily. 10.Verapamil 240 mg at bedtime. 11.Vitamin C 250 mg p.o. t.i.d. 12.Hydrocodone 1-2 tablets every 6 hours as needed for pain. 13.Robaxin 750 mg p.o. every 6 hours as needed for muscle spasms.  DIET:  Regular.  FOLLOWUP:  The patient would follow up with Dr. Kristeen Miss at the neurosurgical office call for appointment; Dr. Delice Lesch, rehab services office to call for appointment; Dr. Lew Dawes, Medical Management.  SPECIAL INSTRUCTIONS:  No driving.  In regard to patient's small sacral ulcer, a home health nurse had been arranged.  For dressing changes of foam dressing to bilateral buttocks, change every 3 days as needed.     Lauraine Rinne, P.A.   ______________________________ Delice Lesch, MD    DA/MEDQ  D:  02/05/2016  T:  02/06/2016  Job:  [id number]  cc:   Earleen Newport, M.D. Delice Lesch, MD Evie Lacks  Plotnikov, MD

## 2016-02-06 NOTE — Progress Notes (Signed)
Patient discharged home.  Left floor via wheelchair, escorted by family.  All patient belongings sent with patient, including DME.  Patient and family verbalized understanding of discharge instructions as given by Marlowe Shores, PA.  Patient appears to be in no immediate distress at this time.  Brita Romp, RN

## 2016-02-08 ENCOUNTER — Telehealth: Payer: Self-pay | Admitting: *Deleted

## 2016-02-08 DIAGNOSIS — R339 Retention of urine, unspecified: Secondary | ICD-10-CM | POA: Diagnosis not present

## 2016-02-08 DIAGNOSIS — E440 Moderate protein-calorie malnutrition: Secondary | ICD-10-CM | POA: Diagnosis not present

## 2016-02-08 DIAGNOSIS — K5900 Constipation, unspecified: Secondary | ICD-10-CM | POA: Diagnosis not present

## 2016-02-08 DIAGNOSIS — L89322 Pressure ulcer of left buttock, stage 2: Secondary | ICD-10-CM | POA: Diagnosis not present

## 2016-02-08 DIAGNOSIS — N183 Chronic kidney disease, stage 3 (moderate): Secondary | ICD-10-CM | POA: Diagnosis not present

## 2016-02-08 DIAGNOSIS — M4726 Other spondylosis with radiculopathy, lumbar region: Secondary | ICD-10-CM | POA: Diagnosis not present

## 2016-02-08 DIAGNOSIS — I129 Hypertensive chronic kidney disease with stage 1 through stage 4 chronic kidney disease, or unspecified chronic kidney disease: Secondary | ICD-10-CM | POA: Diagnosis not present

## 2016-02-08 DIAGNOSIS — Z4789 Encounter for other orthopedic aftercare: Secondary | ICD-10-CM | POA: Diagnosis not present

## 2016-02-08 NOTE — Telephone Encounter (Signed)
Transitional questions answered.  Appointment is NOT confirmed.  Wife is unable to transport patient.  Daughter is coming up from [place] on Wednesday, February 7th, 2018.  Please be aware that patient's daughter will be calling   Transitional Care Questions  Questions for our staff to ask patients on Transitional care 48 hour phone call:   1. Are you/is patient experiencing any problems since coming home? NO Are there any questions regarding any aspect of care? NO   2. Are there any questions regarding medications administration/dosing? NO  Are meds being taken as prescribed? YES Patient should review meds with caller to confirm   3. Have there been any falls? NO   4. Has Home Health been to the house and/or have they contacted you?YES, the nurse is coming out to day and the physical therapist as well.   If not, have you tried to contact them? Can we help you contact them?   5. Are bowels and bladder emptying properly? YES  Are there any unexpected incontinence issues? NO  If applicable, is patient following bowel/bladder programs?  6. Any fevers, problems with breathing, unexpected pain? NO    7. Are there any skin problems or new areas of breakdown? NO    8. Has the patient/family member arranged specialty MD follow up (ie cardiology/neurology/renal/surgical/etc)? NO  Can we help arrange?NO    9. Does the patient need any other services or support that we can help arrange? NO  10. Are caregivers following through as expected in assisting the patient? YES   11. Has the patient quit smoking, drinking alcohol, or using drugs as recommended?Patient does not drink, smoke or use illicit drugs.

## 2016-02-09 DIAGNOSIS — M4726 Other spondylosis with radiculopathy, lumbar region: Secondary | ICD-10-CM | POA: Diagnosis not present

## 2016-02-09 DIAGNOSIS — Z4789 Encounter for other orthopedic aftercare: Secondary | ICD-10-CM | POA: Diagnosis not present

## 2016-02-09 DIAGNOSIS — E440 Moderate protein-calorie malnutrition: Secondary | ICD-10-CM | POA: Diagnosis not present

## 2016-02-09 DIAGNOSIS — L89322 Pressure ulcer of left buttock, stage 2: Secondary | ICD-10-CM | POA: Diagnosis not present

## 2016-02-09 DIAGNOSIS — I129 Hypertensive chronic kidney disease with stage 1 through stage 4 chronic kidney disease, or unspecified chronic kidney disease: Secondary | ICD-10-CM | POA: Diagnosis not present

## 2016-02-09 DIAGNOSIS — N183 Chronic kidney disease, stage 3 (moderate): Secondary | ICD-10-CM | POA: Diagnosis not present

## 2016-02-10 DIAGNOSIS — Z4789 Encounter for other orthopedic aftercare: Secondary | ICD-10-CM | POA: Diagnosis not present

## 2016-02-10 DIAGNOSIS — I129 Hypertensive chronic kidney disease with stage 1 through stage 4 chronic kidney disease, or unspecified chronic kidney disease: Secondary | ICD-10-CM | POA: Diagnosis not present

## 2016-02-10 DIAGNOSIS — E440 Moderate protein-calorie malnutrition: Secondary | ICD-10-CM | POA: Diagnosis not present

## 2016-02-10 DIAGNOSIS — N183 Chronic kidney disease, stage 3 (moderate): Secondary | ICD-10-CM | POA: Diagnosis not present

## 2016-02-10 DIAGNOSIS — M4726 Other spondylosis with radiculopathy, lumbar region: Secondary | ICD-10-CM | POA: Diagnosis not present

## 2016-02-10 DIAGNOSIS — L89322 Pressure ulcer of left buttock, stage 2: Secondary | ICD-10-CM | POA: Diagnosis not present

## 2016-02-11 ENCOUNTER — Encounter: Payer: Self-pay | Admitting: Physical Medicine & Rehabilitation

## 2016-02-11 ENCOUNTER — Encounter: Payer: Medicare Other | Attending: Physical Medicine & Rehabilitation | Admitting: Physical Medicine & Rehabilitation

## 2016-02-11 VITALS — BP 112/75 | HR 89

## 2016-02-11 DIAGNOSIS — M48062 Spinal stenosis, lumbar region with neurogenic claudication: Secondary | ICD-10-CM | POA: Diagnosis not present

## 2016-02-11 DIAGNOSIS — M792 Neuralgia and neuritis, unspecified: Secondary | ICD-10-CM | POA: Diagnosis not present

## 2016-02-11 DIAGNOSIS — L89322 Pressure ulcer of left buttock, stage 2: Secondary | ICD-10-CM | POA: Diagnosis not present

## 2016-02-11 DIAGNOSIS — R339 Retention of urine, unspecified: Secondary | ICD-10-CM | POA: Diagnosis not present

## 2016-02-11 DIAGNOSIS — G8918 Other acute postprocedural pain: Secondary | ICD-10-CM

## 2016-02-11 DIAGNOSIS — Z5181 Encounter for therapeutic drug level monitoring: Secondary | ICD-10-CM

## 2016-02-11 DIAGNOSIS — K5900 Constipation, unspecified: Secondary | ICD-10-CM | POA: Insufficient documentation

## 2016-02-11 DIAGNOSIS — L98419 Non-pressure chronic ulcer of buttock with unspecified severity: Secondary | ICD-10-CM | POA: Insufficient documentation

## 2016-02-11 DIAGNOSIS — I129 Hypertensive chronic kidney disease with stage 1 through stage 4 chronic kidney disease, or unspecified chronic kidney disease: Secondary | ICD-10-CM | POA: Diagnosis not present

## 2016-02-11 DIAGNOSIS — M5117 Intervertebral disc disorders with radiculopathy, lumbosacral region: Secondary | ICD-10-CM | POA: Diagnosis not present

## 2016-02-11 DIAGNOSIS — E440 Moderate protein-calorie malnutrition: Secondary | ICD-10-CM | POA: Diagnosis not present

## 2016-02-11 DIAGNOSIS — M48061 Spinal stenosis, lumbar region without neurogenic claudication: Secondary | ICD-10-CM | POA: Diagnosis not present

## 2016-02-11 DIAGNOSIS — R143 Flatulence: Secondary | ICD-10-CM | POA: Insufficient documentation

## 2016-02-11 DIAGNOSIS — L89159 Pressure ulcer of sacral region, unspecified stage: Secondary | ICD-10-CM

## 2016-02-11 DIAGNOSIS — I10 Essential (primary) hypertension: Secondary | ICD-10-CM

## 2016-02-11 DIAGNOSIS — D509 Iron deficiency anemia, unspecified: Secondary | ICD-10-CM | POA: Diagnosis not present

## 2016-02-11 DIAGNOSIS — R531 Weakness: Secondary | ICD-10-CM | POA: Diagnosis not present

## 2016-02-11 DIAGNOSIS — Z79899 Other long term (current) drug therapy: Secondary | ICD-10-CM

## 2016-02-11 DIAGNOSIS — Z4789 Encounter for other orthopedic aftercare: Secondary | ICD-10-CM | POA: Diagnosis not present

## 2016-02-11 DIAGNOSIS — M47896 Other spondylosis, lumbar region: Secondary | ICD-10-CM | POA: Insufficient documentation

## 2016-02-11 DIAGNOSIS — N183 Chronic kidney disease, stage 3 (moderate): Secondary | ICD-10-CM | POA: Diagnosis not present

## 2016-02-11 DIAGNOSIS — K5903 Drug induced constipation: Secondary | ICD-10-CM

## 2016-02-11 DIAGNOSIS — N189 Chronic kidney disease, unspecified: Secondary | ICD-10-CM | POA: Diagnosis not present

## 2016-02-11 DIAGNOSIS — D62 Acute posthemorrhagic anemia: Secondary | ICD-10-CM | POA: Diagnosis not present

## 2016-02-11 DIAGNOSIS — M4726 Other spondylosis with radiculopathy, lumbar region: Secondary | ICD-10-CM | POA: Diagnosis not present

## 2016-02-11 DIAGNOSIS — R269 Unspecified abnormalities of gait and mobility: Secondary | ICD-10-CM

## 2016-02-11 MED ORDER — SIMETHICONE 80 MG PO CHEW: 80.0000 mg | CHEWABLE_TABLET | Freq: Four times a day (QID) | ORAL | 0 refills | Status: DC | PRN

## 2016-02-11 MED ORDER — BETHANECHOL CHLORIDE 10 MG PO TABS: 10.0000 mg | ORAL_TABLET | Freq: Three times a day (TID) | ORAL | 0 refills | Status: DC

## 2016-02-11 MED ORDER — POLYETHYLENE GLYCOL 3350 17 G PO PACK: 17.0000 g | PACK | Freq: Every day | ORAL | 0 refills | Status: DC

## 2016-02-11 NOTE — Progress Notes (Addendum)
Subjective:    Patient ID: Marvin Morrison, male    DOB: [birth year], 76 y.o.   MRN: [id number]  HPI 76 year old right-handed male with history of hypertension, chronic renal insufficiency presents for transitional care management after receiving CIR for lumbar stenosis with neurogenic claudication s/p L2-5 discectomy.  DATE OF ADMISSION:  01/13/2016 DATE OF DISCHARGE:  02/06/2016  Daughter and wife present, who provide much of history. Daughter is more reasonable and attempts to manage family.  At discharge, he was instructed to follow up with Neurosurg and PCP, with whom he has an appointment. HH evaluated sacral ulcer and she educated pt on pressure relief and not laying in recliner at all times.  His pain is under control.  His BP is under control. He reports conflicting report of bladder issues.  He reports constipation. Today pt complains of left leg pain and gas.    DME: Bedside commode, shower stool Mobility: Walker at home Therapies: 2/week  Pain Inventory Average Pain 5 Pain Right Now 3 My pain is constant and aching  In the last 24 hours, has pain interfered with the following? General activity 0 Relation with others 0 Enjoyment of life 0 What TIME of day is your pain at its worst? daytime Sleep (in general) Good  Pain is worse with: sitting and inactivity Pain improves with: rest and medication Relief from Meds: 9  Mobility use a walker ability to climb steps?  no do you drive?  no use a wheelchair needs help with transfers  Function retired I need assistance with the following:  feeding, dressing, bathing, toileting, meal prep, household duties and shopping  Neuro/Psych bladder control problems bowel control problems weakness trouble walking  Prior Studies x-rays CT/MRI  Physicians involved in your care Primary care . Neurosurgeon .   Family History  Problem Relation Age of Onset  . Hypertension Mother   . Hyperlipidemia Father   .  Hypertension Father    Social History   Social History  . Marital status: Married    Spouse name: N/A  . Number of children: N/A  . Years of education: N/A   Occupational History  . Customer service manager    Social History Main Topics  . Smoking status: Never Smoker  . Smokeless tobacco: Never Used  . Alcohol use No  . Drug use: No  . Sexual activity: Yes   Other Topics Concern  . None   Social History Narrative  . None   Past Surgical History:  Procedure Laterality Date  . LUMBAR LAMINECTOMY/DECOMPRESSION MICRODISCECTOMY N/A 01/11/2016   Procedure: Lumbar two- three, Lumbar three- four, Lumbar four- five, Lumbar five-Sacral one Laminectomy;  Surgeon: Kristeen Miss, MD;  Location: [facility];  Service: Neurosurgery;  Laterality: N/A;  L2-3 L3-4 L4-5 L5-S1 Laminectomy  . TONSILLECTOMY     Age 32   Past Medical History:  Diagnosis Date  . Arthritis   . Chronic kidney disease   . Colitis   . Diarrhea   . Eczema   . Elevated glucose   . HTN (hypertension)   . Hyperlipidemia   . Pneumonia   . Shingles 2009   BP 112/75   Pulse 89   SpO2 94%   Opioid Risk Score:   Fall Risk Score:  `1  Depression screen PHQ 2/9  Depression screen [facility] 2/9 10/28/2015 05/28/2014  Decreased Interest 0 0  Down, Depressed, Hopeless 0 0  PHQ - 2 Score 0 0   Review of Systems  HENT: Negative.  Eyes: Negative.   Respiratory: Negative.   Cardiovascular: Negative.   Gastrointestinal: Positive for constipation and nausea.  Endocrine: Negative.   Genitourinary: Positive for dysuria and frequency.  Musculoskeletal: Positive for back pain and myalgias.  Skin: Negative.   Allergic/Immunologic: Negative.   Neurological: Positive for weakness and numbness.  Hematological: Negative.   Psychiatric/Behavioral: Negative.       Objective:   Physical Exam Gen: NAD. Well-developed.  HENT: Normocephalic. Atraumatic. Eyes: EOMI. No discharge. Cardiovascular: RRR. No JVD. Respiratory: CTA  bilaterally. Unlabored.  GI: Soft, BS+ Musculoskeletal: He exhibits no edema. No tenderness. Neurological: He is drowsy.  B/l UE strength 5/5 B/l LE: 4-/5 HF,KE  LLE ADF 4-/5  RLE ADF 4-/5 (stronger than right) Skin: Scattered abrasions, healing. Psychiatric: He has a normal mood and affect.     Assessment & Plan:  76 year old right-handed male with history of hypertension, chronic renal insufficiency presents for transitional care management after receiving CIR for lumbar stenosis with neurogenic claudication s/p L2-5 discectomy.  1. 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  Pain Management  Encouraged pt to wean narcotics, will not be on long-term as pt does not need this.   Cont Robaxin, Gabapentin, Lidoderm  Much of pain has behavioral component with pt responding differently to different providers - consistent with rehab stay  3. Acute blood loss anemia, iron deficiency.    Continue iron supplement, vitamin C   Pt to follow up with PCP next week for routine labs  4. Bilateral buttocks and sacral ulcer  Educated on pressure relief  Cont HH nursing  5. Urinary retention/incontinence  Cont Flomax  Will trial Urocholine  6. Constipation   Will order Miralax  7. Gas  Will order simethicone  Meds reviewed Referrals reviewed All questions answered.

## 2016-02-12 ENCOUNTER — Encounter: Payer: Medicare Other | Admitting: Physical Medicine & Rehabilitation

## 2016-02-12 ENCOUNTER — Telehealth: Payer: Self-pay

## 2016-02-12 DIAGNOSIS — Z79899 Other long term (current) drug therapy: Secondary | ICD-10-CM | POA: Diagnosis not present

## 2016-02-12 DIAGNOSIS — M792 Neuralgia and neuritis, unspecified: Secondary | ICD-10-CM | POA: Diagnosis not present

## 2016-02-12 DIAGNOSIS — G8918 Other acute postprocedural pain: Secondary | ICD-10-CM | POA: Diagnosis not present

## 2016-02-12 DIAGNOSIS — M48062 Spinal stenosis, lumbar region with neurogenic claudication: Secondary | ICD-10-CM | POA: Diagnosis not present

## 2016-02-12 DIAGNOSIS — Z5181 Encounter for therapeutic drug level monitoring: Secondary | ICD-10-CM | POA: Diagnosis not present

## 2016-02-12 NOTE — Addendum Note (Signed)
Addended by: Caro Hight on: 02/12/2016 10:16 AM   Modules accepted: Orders

## 2016-02-12 NOTE — Telephone Encounter (Signed)
Patients wife called today, stated that Marvin Morrison is experiencing pain while urinating now and is unable to pick up prescription medication from pharmacy until after 3pm, was wanting to know if this medicine will be able to help with painful urination symptoms

## 2016-02-12 NOTE — Telephone Encounter (Signed)
Called patient and informed him to not take his urecholine, as advised by dr patel, until after he is checked out by his primary care provider for possible UTI

## 2016-02-15 ENCOUNTER — Inpatient Hospital Stay: Payer: Medicare Other | Admitting: Internal Medicine

## 2016-02-15 ENCOUNTER — Telehealth: Payer: Self-pay | Admitting: Internal Medicine

## 2016-02-15 DIAGNOSIS — Z4789 Encounter for other orthopedic aftercare: Secondary | ICD-10-CM | POA: Diagnosis not present

## 2016-02-15 DIAGNOSIS — N183 Chronic kidney disease, stage 3 (moderate): Secondary | ICD-10-CM | POA: Diagnosis not present

## 2016-02-15 DIAGNOSIS — L89322 Pressure ulcer of left buttock, stage 2: Secondary | ICD-10-CM | POA: Diagnosis not present

## 2016-02-15 DIAGNOSIS — I129 Hypertensive chronic kidney disease with stage 1 through stage 4 chronic kidney disease, or unspecified chronic kidney disease: Secondary | ICD-10-CM | POA: Diagnosis not present

## 2016-02-15 DIAGNOSIS — E440 Moderate protein-calorie malnutrition: Secondary | ICD-10-CM | POA: Diagnosis not present

## 2016-02-15 DIAGNOSIS — M4726 Other spondylosis with radiculopathy, lumbar region: Secondary | ICD-10-CM | POA: Diagnosis not present

## 2016-02-15 NOTE — Telephone Encounter (Signed)
Routing to dr plotnikov, please advise, thanks 

## 2016-02-15 NOTE — Telephone Encounter (Signed)
Use OTC Lotrimin cream on glans penis tid x 7 days Can wife bring urine for UA? Thx

## 2016-02-15 NOTE — Telephone Encounter (Signed)
Yes. Thx.

## 2016-02-15 NOTE — Telephone Encounter (Signed)
Routing to dr plotnikov, please advise, I will call patient back----and patient's wife will also be bringing sterile urine sample to our office soon

## 2016-02-15 NOTE — Telephone Encounter (Signed)
Would like to know if patient could try Azo for burning during urination.

## 2016-02-15 NOTE — Telephone Encounter (Signed)
Patient is calling in saying he only wants to talk to the nurse [facility] or Dr. Alain Marion. He is having pain when he pee. He states over all he is in a lot of pain. He would like some medications for it. He really wants someone to call him back. He would not make an appointment. He states he is unable to come in due to a surgery he has had. Please follow up, Thank you.

## 2016-02-15 NOTE — Telephone Encounter (Signed)
Advised patient, he wanted me to advise his wife, gave dr plotnikovs instructions----she will bring urine sample in sterile container

## 2016-02-16 ENCOUNTER — Telehealth: Payer: Self-pay

## 2016-02-16 ENCOUNTER — Other Ambulatory Visit (INDEPENDENT_AMBULATORY_CARE_PROVIDER_SITE_OTHER): Payer: Medicare Other

## 2016-02-16 DIAGNOSIS — R300 Dysuria: Secondary | ICD-10-CM

## 2016-02-16 DIAGNOSIS — N183 Chronic kidney disease, stage 3 (moderate): Secondary | ICD-10-CM | POA: Diagnosis not present

## 2016-02-16 DIAGNOSIS — I129 Hypertensive chronic kidney disease with stage 1 through stage 4 chronic kidney disease, or unspecified chronic kidney disease: Secondary | ICD-10-CM | POA: Diagnosis not present

## 2016-02-16 DIAGNOSIS — E440 Moderate protein-calorie malnutrition: Secondary | ICD-10-CM | POA: Diagnosis not present

## 2016-02-16 DIAGNOSIS — Z4789 Encounter for other orthopedic aftercare: Secondary | ICD-10-CM | POA: Diagnosis not present

## 2016-02-16 DIAGNOSIS — M4726 Other spondylosis with radiculopathy, lumbar region: Secondary | ICD-10-CM | POA: Diagnosis not present

## 2016-02-16 DIAGNOSIS — L89322 Pressure ulcer of left buttock, stage 2: Secondary | ICD-10-CM | POA: Diagnosis not present

## 2016-02-16 LAB — URINALYSIS, ROUTINE W REFLEX MICROSCOPIC
Bilirubin Urine: NEGATIVE
HGB URINE DIPSTICK: NEGATIVE
KETONES UR: NEGATIVE
Nitrite: NEGATIVE
Specific Gravity, Urine: <=1.005 — AB (ref 1.000–1.030)
Total Protein, Urine: NEGATIVE
URINE GLUCOSE: NEGATIVE
UROBILINOGEN UA: 0.2 (ref 0.0–1.0)
pH: 7 (ref 5.0–8.0)

## 2016-02-16 NOTE — Telephone Encounter (Signed)
error 

## 2016-02-16 NOTE — Telephone Encounter (Signed)
Advised ms Conteh of dr plots ok to use azo and reminded her to bring in Mr normans urine sample

## 2016-02-17 ENCOUNTER — Other Ambulatory Visit: Payer: Self-pay | Admitting: Internal Medicine

## 2016-02-17 ENCOUNTER — Telehealth: Payer: Self-pay | Admitting: *Deleted

## 2016-02-17 DIAGNOSIS — I129 Hypertensive chronic kidney disease with stage 1 through stage 4 chronic kidney disease, or unspecified chronic kidney disease: Secondary | ICD-10-CM | POA: Diagnosis not present

## 2016-02-17 DIAGNOSIS — Z4789 Encounter for other orthopedic aftercare: Secondary | ICD-10-CM | POA: Diagnosis not present

## 2016-02-17 DIAGNOSIS — L89322 Pressure ulcer of left buttock, stage 2: Secondary | ICD-10-CM | POA: Diagnosis not present

## 2016-02-17 DIAGNOSIS — E440 Moderate protein-calorie malnutrition: Secondary | ICD-10-CM | POA: Diagnosis not present

## 2016-02-17 DIAGNOSIS — M4726 Other spondylosis with radiculopathy, lumbar region: Secondary | ICD-10-CM | POA: Diagnosis not present

## 2016-02-17 DIAGNOSIS — N183 Chronic kidney disease, stage 3 (moderate): Secondary | ICD-10-CM | POA: Diagnosis not present

## 2016-02-17 MED ORDER — CEFUROXIME AXETIL 250 MG PO TABS: 250.0000 mg | ORAL_TABLET | Freq: Two times a day (BID) | ORAL | 0 refills | Status: DC

## 2016-02-17 NOTE — Telephone Encounter (Addendum)
Mrs Kindred is asking for refill on Mr Thede's hydrocodone.  (02/12/16 UDS results are not back)

## 2016-02-18 ENCOUNTER — Other Ambulatory Visit: Payer: Self-pay

## 2016-02-18 ENCOUNTER — Telehealth: Payer: Self-pay | Admitting: *Deleted

## 2016-02-18 DIAGNOSIS — E440 Moderate protein-calorie malnutrition: Secondary | ICD-10-CM | POA: Diagnosis not present

## 2016-02-18 DIAGNOSIS — M4726 Other spondylosis with radiculopathy, lumbar region: Secondary | ICD-10-CM | POA: Diagnosis not present

## 2016-02-18 DIAGNOSIS — L89322 Pressure ulcer of left buttock, stage 2: Secondary | ICD-10-CM | POA: Diagnosis not present

## 2016-02-18 DIAGNOSIS — N183 Chronic kidney disease, stage 3 (moderate): Secondary | ICD-10-CM | POA: Diagnosis not present

## 2016-02-18 DIAGNOSIS — Z4789 Encounter for other orthopedic aftercare: Secondary | ICD-10-CM | POA: Diagnosis not present

## 2016-02-18 DIAGNOSIS — I129 Hypertensive chronic kidney disease with stage 1 through stage 4 chronic kidney disease, or unspecified chronic kidney disease: Secondary | ICD-10-CM | POA: Diagnosis not present

## 2016-02-18 MED ORDER — HYDROCODONE-ACETAMINOPHEN 5-325 MG PO TABS: 1.0000 | ORAL_TABLET | Freq: Four times a day (QID) | ORAL | 0 refills | Status: DC | PRN

## 2016-02-18 NOTE — Telephone Encounter (Signed)
Pt left msg on triage stating he would like refill on his pain medication. He had spine surgery not able to walk right now, and in a lot of pain....Johny Chess

## 2016-02-18 NOTE — Telephone Encounter (Signed)
Can we call to see why we do not have the results yet?  Thanks.

## 2016-02-18 NOTE — Telephone Encounter (Signed)
Called and spoke with patients daughter which has confirmed that the patient has indeed been weaning off the medication to 1 pill every 6 hours prn pain and she will have the patients wife come and pick up the new prescription today.

## 2016-02-18 NOTE — Telephone Encounter (Signed)
Marvin Morrison called again today inquiring about his UDS results and new pain medicine prescription

## 2016-02-19 LAB — TOXASSURE SELECT,+ANTIDEPR,UR

## 2016-02-19 LAB — CULTURE, URINE COMPREHENSIVE

## 2016-02-21 NOTE — Telephone Encounter (Signed)
OK to ref Norco Thx

## 2016-02-22 DIAGNOSIS — Z4789 Encounter for other orthopedic aftercare: Secondary | ICD-10-CM | POA: Diagnosis not present

## 2016-02-22 DIAGNOSIS — E440 Moderate protein-calorie malnutrition: Secondary | ICD-10-CM | POA: Diagnosis not present

## 2016-02-22 DIAGNOSIS — L89322 Pressure ulcer of left buttock, stage 2: Secondary | ICD-10-CM | POA: Diagnosis not present

## 2016-02-22 DIAGNOSIS — M4726 Other spondylosis with radiculopathy, lumbar region: Secondary | ICD-10-CM | POA: Diagnosis not present

## 2016-02-22 DIAGNOSIS — I129 Hypertensive chronic kidney disease with stage 1 through stage 4 chronic kidney disease, or unspecified chronic kidney disease: Secondary | ICD-10-CM | POA: Diagnosis not present

## 2016-02-22 DIAGNOSIS — N183 Chronic kidney disease, stage 3 (moderate): Secondary | ICD-10-CM | POA: Diagnosis not present

## 2016-02-22 MED ORDER — HYDROCODONE-ACETAMINOPHEN 5-325 MG PO TABS: 1.0000 | ORAL_TABLET | Freq: Four times a day (QID) | ORAL | 0 refills | Status: DC | PRN

## 2016-02-22 NOTE — Telephone Encounter (Signed)
Daughter called back.  States that patient feels a lot better.

## 2016-02-22 NOTE — Telephone Encounter (Signed)
Called pt spoke w/daughter Olam Idler) inform MD ok the pain med (Hydrocodone). She stated father do not need Dr. Posey Pronto gave him a refill. Voided script...Marvin Morrison

## 2016-02-23 DIAGNOSIS — Z4789 Encounter for other orthopedic aftercare: Secondary | ICD-10-CM | POA: Diagnosis not present

## 2016-02-23 DIAGNOSIS — M4726 Other spondylosis with radiculopathy, lumbar region: Secondary | ICD-10-CM | POA: Diagnosis not present

## 2016-02-23 DIAGNOSIS — E440 Moderate protein-calorie malnutrition: Secondary | ICD-10-CM | POA: Diagnosis not present

## 2016-02-23 DIAGNOSIS — L89322 Pressure ulcer of left buttock, stage 2: Secondary | ICD-10-CM | POA: Diagnosis not present

## 2016-02-23 DIAGNOSIS — I129 Hypertensive chronic kidney disease with stage 1 through stage 4 chronic kidney disease, or unspecified chronic kidney disease: Secondary | ICD-10-CM | POA: Diagnosis not present

## 2016-02-23 DIAGNOSIS — N183 Chronic kidney disease, stage 3 (moderate): Secondary | ICD-10-CM | POA: Diagnosis not present

## 2016-02-24 ENCOUNTER — Telehealth: Payer: Self-pay

## 2016-02-24 DIAGNOSIS — M4726 Other spondylosis with radiculopathy, lumbar region: Secondary | ICD-10-CM | POA: Diagnosis not present

## 2016-02-24 DIAGNOSIS — L89322 Pressure ulcer of left buttock, stage 2: Secondary | ICD-10-CM | POA: Diagnosis not present

## 2016-02-24 DIAGNOSIS — Z4789 Encounter for other orthopedic aftercare: Secondary | ICD-10-CM | POA: Diagnosis not present

## 2016-02-24 DIAGNOSIS — E440 Moderate protein-calorie malnutrition: Secondary | ICD-10-CM | POA: Diagnosis not present

## 2016-02-24 DIAGNOSIS — I129 Hypertensive chronic kidney disease with stage 1 through stage 4 chronic kidney disease, or unspecified chronic kidney disease: Secondary | ICD-10-CM | POA: Diagnosis not present

## 2016-02-24 DIAGNOSIS — N183 Chronic kidney disease, stage 3 (moderate): Secondary | ICD-10-CM | POA: Diagnosis not present

## 2016-02-24 NOTE — Telephone Encounter (Signed)
Maggie RN [phone]) called today, requesting an increase in home health aid, states that his primary care giver (wife) is no longer able to help him as much do to her breaking her shoulder.  Requesting instead of him receiving 1 day a week that his visits increase to 2 times a week due to this situation.

## 2016-02-24 NOTE — Telephone Encounter (Signed)
We can increase his therapy.  Thanks.

## 2016-02-25 ENCOUNTER — Encounter: Payer: Self-pay | Admitting: Internal Medicine

## 2016-02-25 ENCOUNTER — Telehealth: Payer: Self-pay

## 2016-02-25 ENCOUNTER — Ambulatory Visit (INDEPENDENT_AMBULATORY_CARE_PROVIDER_SITE_OTHER): Payer: Medicare Other | Admitting: Internal Medicine

## 2016-02-25 ENCOUNTER — Other Ambulatory Visit (INDEPENDENT_AMBULATORY_CARE_PROVIDER_SITE_OTHER): Payer: Medicare Other

## 2016-02-25 VITALS — BP 144/76 | HR 89 | Temp 98.0°F | Resp 16 | Ht 72.0 in | Wt 168.0 lb

## 2016-02-25 DIAGNOSIS — M109 Gout, unspecified: Secondary | ICD-10-CM | POA: Diagnosis not present

## 2016-02-25 DIAGNOSIS — R269 Unspecified abnormalities of gait and mobility: Secondary | ICD-10-CM | POA: Diagnosis not present

## 2016-02-25 DIAGNOSIS — D62 Acute posthemorrhagic anemia: Secondary | ICD-10-CM | POA: Diagnosis not present

## 2016-02-25 DIAGNOSIS — R634 Abnormal weight loss: Secondary | ICD-10-CM

## 2016-02-25 LAB — BASIC METABOLIC PANEL
BUN: 31 mg/dL — ABNORMAL HIGH (ref 6–23)
CALCIUM: 8.9 mg/dL (ref 8.4–10.5)
CHLORIDE: 101 meq/L (ref 96–112)
CO2: 27 meq/L (ref 19–32)
CREATININE: 1.33 mg/dL (ref 0.40–1.50)
GFR: 55.35 mL/min — ABNORMAL LOW (ref >60.00)
GLUCOSE: 165 mg/dL — AB (ref 70–99)
Potassium: 3.7 mEq/L (ref 3.5–5.1)
Sodium: 136 mEq/L (ref 135–145)

## 2016-02-25 LAB — CBC WITH DIFFERENTIAL/PLATELET
BASOS ABS: 0.1 10*3/uL (ref 0.0–0.1)
BASOS PCT: 0.7 % (ref 0.0–3.0)
EOS ABS: 0.5 10*3/uL (ref 0.0–0.7)
Eosinophils Relative: 4 % (ref 0.0–5.0)
HEMATOCRIT: 28.6 % — AB (ref 39.0–52.0)
Hemoglobin: 9.5 g/dL — ABNORMAL LOW (ref 13.0–17.0)
LYMPHS ABS: 1.4 10*3/uL (ref 0.7–4.0)
LYMPHS PCT: 11.4 % — AB (ref 12.0–46.0)
MCHC: 33.1 g/dL (ref 30.0–36.0)
MCV: 93.4 fl (ref 78.0–100.0)
MONOS PCT: 6.4 % (ref 3.0–12.0)
Monocytes Absolute: 0.8 10*3/uL (ref 0.1–1.0)
NEUTROS ABS: 9.7 10*3/uL — AB (ref 1.4–7.7)
NEUTROS PCT: 77.5 % — AB (ref 43.0–77.0)
PLATELETS: 381 10*3/uL (ref 150.0–400.0)
RBC: 3.06 Mil/uL — ABNORMAL LOW (ref 4.22–5.81)
RDW: 16.3 % — AB (ref 11.5–15.5)
WBC: 12.5 10*3/uL — ABNORMAL HIGH (ref 4.0–10.5)

## 2016-02-25 LAB — HEPATIC FUNCTION PANEL
ALBUMIN: 3.7 g/dL (ref 3.5–5.2)
ALT: 21 U/L (ref 0–53)
AST: 15 U/L (ref 0–37)
Alkaline Phosphatase: 76 U/L (ref 39–117)
Bilirubin, Direct: 0 mg/dL (ref 0.0–0.3)
Total Bilirubin: 0.2 mg/dL (ref 0.2–1.2)
Total Protein: 6.1 g/dL (ref 6.0–8.3)

## 2016-02-25 LAB — TSH: TSH: 1.27 u[IU]/mL (ref 0.35–4.50)

## 2016-02-25 LAB — URIC ACID: Uric Acid, Serum: 5 mg/dL (ref 4.0–7.8)

## 2016-02-25 MED ORDER — NITROFURANTOIN MONOHYD MACRO 100 MG PO CAPS: 100.0000 mg | ORAL_CAPSULE | Freq: Two times a day (BID) | ORAL | 0 refills | Status: AC

## 2016-02-25 MED ORDER — HYDROCODONE-ACETAMINOPHEN 5-325 MG PO TABS: 1.0000 | ORAL_TABLET | Freq: Four times a day (QID) | ORAL | 0 refills | Status: DC | PRN

## 2016-02-25 MED ORDER — METHYLPREDNISOLONE ACETATE 40 MG/ML IJ SUSP
40.0000 mg | Freq: Once | INTRAMUSCULAR | Status: AC
  Administered 2016-02-25: 40 mg via INTRAMUSCULAR

## 2016-02-25 MED ORDER — FUROSEMIDE 20 MG PO TABS: 20.0000 mg | ORAL_TABLET | Freq: Every day | ORAL | 3 refills | Status: DC | PRN

## 2016-02-25 MED ORDER — METOPROLOL SUCCINATE ER 50 MG PO TB24: 50.0000 mg | ORAL_TABLET | Freq: Every day | ORAL | 3 refills | Status: DC

## 2016-02-25 MED ORDER — COLCHICINE 0.6 MG PO TABS: 0.6000 mg | ORAL_TABLET | Freq: Every day | ORAL | 2 refills | Status: DC

## 2016-02-25 MED ORDER — VERAPAMIL HCL ER 180 MG PO CP24: 180.0000 mg | ORAL_CAPSULE | Freq: Every day | ORAL | 11 refills | Status: DC

## 2016-02-25 NOTE — Assessment & Plan Note (Signed)
Labs

## 2016-02-25 NOTE — Progress Notes (Signed)
Subjective:  Patient ID: Marvin Morrison, male    DOB: [birth year]  Age: 76 y.o. MRN: [id number]  CC: Hospitalization Follow-up (numbness in lower exremity, possible uti, bedsore, neuropathy, spinal stenosis, )   HPI ZUBIN BIERLEIN presents for gait disorder, B ankle pain, urinary urgency f/u Stool is black - was on iron, pasty C/o B ankle pain and swelling  Outpatient Medications Prior to Visit  Medication Sig Dispense Refill  . acyclovir (ZOVIRAX) 400 MG tablet Take 1 tablet (400 mg total) by mouth 2 (two) times daily. 60 tablet 11  . gabapentin (NEURONTIN) 100 MG capsule Take 1 capsule (100 mg total) by mouth 3 (three) times daily. 90 capsule 1  . HYDROcodone-acetaminophen (NORCO/VICODIN) 5-325 MG tablet Take 1 tablet by mouth every 6 (six) hours as needed (mild pain). 30 tablet 0  . lidocaine (LIDODERM) 5 % Place 1 patch onto the skin daily. Remove & Discard patch within 12 hours or as directed by MD 30 patch 0  . methocarbamol (ROBAXIN) 750 MG tablet Take 1 tablet (750 mg total) by mouth every 6 (six) hours as needed for muscle spasms. 60 tablet 0  . Multiple Vitamin (MULTIVITAMIN WITH MINERALS) TABS tablet Take 1 tablet by mouth daily.    . ondansetron (ZOFRAN) 4 MG tablet Take 1 tablet (4 mg total) by mouth daily. 20 tablet 0  . pantoprazole (PROTONIX) 40 MG tablet Take 1 tablet (40 mg total) by mouth daily. 90 tablet 2  . tamsulosin (FLOMAX) 0.4 MG CAPS capsule Take 1 capsule (0.4 mg total) by mouth daily. 30 capsule 1  . verapamil (CALAN-SR) 240 MG CR tablet Take 1 tablet (240 mg total) by mouth at bedtime. 90 tablet 2  . vitamin C (VITAMIN C) 250 MG tablet Take 1 tablet (250 mg total) by mouth 3 (three) times daily with meals.    . ferrous sulfate 325 (65 FE) MG tablet Take 1 tablet (325 mg total) by mouth 3 (three) times daily with meals. (Patient not taking: Reported on 02/25/2016) 90 tablet 3  . bethanechol (URECHOLINE) 10 MG tablet Take 1 tablet (10 mg total) by mouth 3 (three)  times daily. 90 tablet 0  . cefUROXime (CEFTIN) 250 MG tablet Take 1 tablet (250 mg total) by mouth 2 (two) times daily. 14 tablet 0  . polyethylene glycol (MIRALAX) packet Take 17 g by mouth daily. 30 packet 0  . simethicone (GAS-X) 80 MG chewable tablet Chew 1 tablet (80 mg total) by mouth every 6 (six) hours as needed for flatulence. 60 tablet 0   No facility-administered medications prior to visit.     ROS Review of Systems  Constitutional: Positive for fatigue. Negative for appetite change and unexpected weight change.  HENT: Negative for congestion, nosebleeds, sneezing, sore throat and trouble swallowing.   Eyes: Negative for itching and visual disturbance.  Respiratory: Negative for cough.   Cardiovascular: Negative for chest pain, palpitations and leg swelling.  Gastrointestinal: Negative for abdominal distention, blood in stool, diarrhea and nausea.  Genitourinary: Negative for frequency and hematuria.  Musculoskeletal: Positive for back pain and gait problem. Negative for joint swelling and neck pain.  Skin: Negative for rash.  Neurological: Positive for weakness. Negative for dizziness, tremors and speech difficulty.  Psychiatric/Behavioral: Negative for agitation, dysphoric mood and sleep disturbance. The patient is not nervous/anxious.     Objective:  BP (!) 144/76   Pulse 89   Temp 98 F (36.7 C) (Oral)   Resp 16   Ht 6' (  1.829 m)   Wt 168 lb (76.2 kg)   SpO2 97%   BMI 22.78 kg/m   BP Readings from Last 3 Encounters:  02/25/16 (!) 144/76  02/11/16 112/75  02/06/16 108/62    Wt Readings from Last 3 Encounters:  02/25/16 168 lb (76.2 kg)  02/03/16 179 lb 10.8 oz (81.5 kg)  01/11/16 166 lb 14.2 oz (75.7 kg)    Physical Exam  Lab Results  Component Value Date   WBC 8.1 02/05/2016   HGB 9.5 (L) 02/05/2016   HCT 29.4 (L) 02/05/2016   PLT 250 02/05/2016   GLUCOSE 92 02/05/2016   CHOL 154 12/24/2013   TRIG 287.0 (H) 12/24/2013   HDL 18.10 (L)  12/24/2013   LDLDIRECT 92.1 12/24/2013   LDLCALC 102 (H) 06/13/2013   ALT 7 (L) 01/14/2016   AST 16 01/14/2016   NA 137 02/05/2016   K 4.0 02/05/2016   CL 100 (L) 02/05/2016   CREATININE 1.26 (H) 02/05/2016   BUN 19 02/05/2016   CO2 27 02/05/2016   TSH 0.45 04/18/2014   PSA 0.58 12/24/2013   HGBA1C 5.8 04/18/2014    Dg Chest 2 View  Result Date: 01/13/2016 CLINICAL DATA:  Elevated white blood cell count following lumbar surgery 2 days ago. EXAM: CHEST  2 VIEW COMPARISON:  Portable chest x-ray of January 12, 2016. FINDINGS: The lungs are adequately inflated. The interstitial markings remain coarse bilaterally similar to those seen yesterday. The heart is top-normal in size. The central pulmonary vascularity is mildly prominent but also stable. There is a small amount of blunting of the posterior costophrenic angles. There is calcification in the wall of the aortic arch. The bony thorax exhibits no acute abnormality. IMPRESSION: Interstitial prominence bilaterally not greatly changed since yesterday's study but significantly changed since April of 2016. This may reflect mild interstitial edema or interstitial pneumonia. There is no alveolar pneumonia. Tiny bilateral pleural effusions layering posteriorly. Thoracic aortic atherosclerosis. Electronically Signed   By: David  Martinique M.D.   On: 01/13/2016 10:14    Assessment & Plan:   There are no diagnoses linked to this encounter. I have discontinued Mr. Scherr bethanechol, polyethylene glycol, simethicone, and cefUROXime. I am also having him maintain his acyclovir, ferrous sulfate, gabapentin, lidocaine, methocarbamol, multivitamin with minerals, ondansetron, pantoprazole, tamsulosin, verapamil, ascorbic acid, and HYDROcodone-acetaminophen.  No orders of the defined types were placed in this encounter.    Follow-up: No Follow-up on file.  Walker Kehr, MD

## 2016-02-25 NOTE — Assessment & Plan Note (Signed)
In PT 

## 2016-02-25 NOTE — Patient Instructions (Addendum)
Compression socks 

## 2016-02-25 NOTE — Addendum Note (Signed)
Addended by: Cassandria Anger on: 02/25/2016 11:32 PM   Modules accepted: Orders

## 2016-02-25 NOTE — Telephone Encounter (Signed)
Metoprolol Rx emailed - pls start D/c Verapamil Thx

## 2016-02-25 NOTE — Progress Notes (Signed)
Pre-visit discussion using our clinic review tool. No additional management support is needed unless otherwise documented below in the visit note.  

## 2016-02-25 NOTE — Telephone Encounter (Signed)
Rx change request from pharmacy due to coverage. Verapamil 180 mg cap not covered under insurance...  Pharmacy requesting either Amlodipine, Diltiazem or metoprolol.. Please advise

## 2016-02-25 NOTE — Telephone Encounter (Signed)
Called maggie back relaying the doctors note

## 2016-02-26 DIAGNOSIS — I129 Hypertensive chronic kidney disease with stage 1 through stage 4 chronic kidney disease, or unspecified chronic kidney disease: Secondary | ICD-10-CM | POA: Diagnosis not present

## 2016-02-26 DIAGNOSIS — M4726 Other spondylosis with radiculopathy, lumbar region: Secondary | ICD-10-CM | POA: Diagnosis not present

## 2016-02-26 DIAGNOSIS — E440 Moderate protein-calorie malnutrition: Secondary | ICD-10-CM | POA: Diagnosis not present

## 2016-02-26 DIAGNOSIS — L89322 Pressure ulcer of left buttock, stage 2: Secondary | ICD-10-CM | POA: Diagnosis not present

## 2016-02-26 DIAGNOSIS — N183 Chronic kidney disease, stage 3 (moderate): Secondary | ICD-10-CM | POA: Diagnosis not present

## 2016-02-26 DIAGNOSIS — Z4789 Encounter for other orthopedic aftercare: Secondary | ICD-10-CM | POA: Diagnosis not present

## 2016-02-26 NOTE — Telephone Encounter (Signed)
Okay to fill rx for zofran?  Please advise   [place] (pt dtr/On DPR) Left detailed message rq rx for zofran and PA for verapamil.  Left detailed message with [place] that verapamil was dc'ed and changed to metoprolol

## 2016-02-26 NOTE — Progress Notes (Signed)
Urine drug screen for this encounter is consistent for prescribed medication 

## 2016-02-27 MED ORDER — ONDANSETRON HCL 4 MG PO TABS: 4.0000 mg | ORAL_TABLET | Freq: Every day | ORAL | 2 refills | Status: DC

## 2016-02-27 NOTE — Telephone Encounter (Signed)
Ok Done Thx 

## 2016-02-29 NOTE — Telephone Encounter (Signed)
Patient notified

## 2016-03-01 DIAGNOSIS — L89322 Pressure ulcer of left buttock, stage 2: Secondary | ICD-10-CM | POA: Diagnosis not present

## 2016-03-01 DIAGNOSIS — M4726 Other spondylosis with radiculopathy, lumbar region: Secondary | ICD-10-CM | POA: Diagnosis not present

## 2016-03-01 DIAGNOSIS — N183 Chronic kidney disease, stage 3 (moderate): Secondary | ICD-10-CM | POA: Diagnosis not present

## 2016-03-01 DIAGNOSIS — Z4789 Encounter for other orthopedic aftercare: Secondary | ICD-10-CM | POA: Diagnosis not present

## 2016-03-01 DIAGNOSIS — E440 Moderate protein-calorie malnutrition: Secondary | ICD-10-CM | POA: Diagnosis not present

## 2016-03-01 DIAGNOSIS — I129 Hypertensive chronic kidney disease with stage 1 through stage 4 chronic kidney disease, or unspecified chronic kidney disease: Secondary | ICD-10-CM | POA: Diagnosis not present

## 2016-03-02 ENCOUNTER — Other Ambulatory Visit: Payer: Self-pay | Admitting: Internal Medicine

## 2016-03-03 ENCOUNTER — Telehealth: Payer: Self-pay | Admitting: *Deleted

## 2016-03-03 MED ORDER — GABAPENTIN 100 MG PO CAPS: 100.0000 mg | ORAL_CAPSULE | Freq: Three times a day (TID) | ORAL | 3 refills | Status: DC

## 2016-03-03 MED ORDER — TAMSULOSIN HCL 0.4 MG PO CAPS: 0.4000 mg | ORAL_CAPSULE | Freq: Every day | ORAL | 11 refills | Status: DC

## 2016-03-03 MED ORDER — GABAPENTIN 100 MG PO CAPS: 100.0000 mg | ORAL_CAPSULE | Freq: Three times a day (TID) | ORAL | 11 refills | Status: DC

## 2016-03-03 NOTE — Telephone Encounter (Signed)
Flomax - pls cont Gabapentin - he has a Rx for 100 mg tid - is it how he is using it? Thx

## 2016-03-03 NOTE — Telephone Encounter (Signed)
Ok Thx 

## 2016-03-03 NOTE — Telephone Encounter (Signed)
Daughter left msg on triage stating dad is needing refill on the Gabapentin med is working fine for him taking three times a day, and also he was given flomax while in the hospital. Needing to know if he need to continue taking if so will need rx [facility]. Sent refill on gabapentin pls advise on Flomax...Marvin Morrison

## 2016-03-03 NOTE — Telephone Encounter (Signed)
Left msg on triage stating needing to get verbal order for continuation PT/HH for 2 x's a week for 4 more weeks to work on ADL's, strengthening, and how to move around at home...Johny Chess

## 2016-03-04 DIAGNOSIS — M4726 Other spondylosis with radiculopathy, lumbar region: Secondary | ICD-10-CM | POA: Diagnosis not present

## 2016-03-04 DIAGNOSIS — L89322 Pressure ulcer of left buttock, stage 2: Secondary | ICD-10-CM | POA: Diagnosis not present

## 2016-03-04 DIAGNOSIS — I129 Hypertensive chronic kidney disease with stage 1 through stage 4 chronic kidney disease, or unspecified chronic kidney disease: Secondary | ICD-10-CM | POA: Diagnosis not present

## 2016-03-04 DIAGNOSIS — N183 Chronic kidney disease, stage 3 (moderate): Secondary | ICD-10-CM | POA: Diagnosis not present

## 2016-03-04 DIAGNOSIS — Z4789 Encounter for other orthopedic aftercare: Secondary | ICD-10-CM | POA: Diagnosis not present

## 2016-03-04 DIAGNOSIS — E440 Moderate protein-calorie malnutrition: Secondary | ICD-10-CM | POA: Diagnosis not present

## 2016-03-04 NOTE — Telephone Encounter (Signed)
Notified daughter father should continue the Flomax rx's was sent yesterday on other medications [facility].../lmb

## 2016-03-04 NOTE — Telephone Encounter (Signed)
Notified Barnabas Lister w/MD response...Johny Chess

## 2016-03-07 DIAGNOSIS — Z4789 Encounter for other orthopedic aftercare: Secondary | ICD-10-CM | POA: Diagnosis not present

## 2016-03-07 DIAGNOSIS — M4726 Other spondylosis with radiculopathy, lumbar region: Secondary | ICD-10-CM | POA: Diagnosis not present

## 2016-03-07 DIAGNOSIS — I129 Hypertensive chronic kidney disease with stage 1 through stage 4 chronic kidney disease, or unspecified chronic kidney disease: Secondary | ICD-10-CM | POA: Diagnosis not present

## 2016-03-07 DIAGNOSIS — E440 Moderate protein-calorie malnutrition: Secondary | ICD-10-CM | POA: Diagnosis not present

## 2016-03-07 DIAGNOSIS — N183 Chronic kidney disease, stage 3 (moderate): Secondary | ICD-10-CM | POA: Diagnosis not present

## 2016-03-07 DIAGNOSIS — L89322 Pressure ulcer of left buttock, stage 2: Secondary | ICD-10-CM | POA: Diagnosis not present

## 2016-03-08 DIAGNOSIS — I129 Hypertensive chronic kidney disease with stage 1 through stage 4 chronic kidney disease, or unspecified chronic kidney disease: Secondary | ICD-10-CM | POA: Diagnosis not present

## 2016-03-08 DIAGNOSIS — N183 Chronic kidney disease, stage 3 (moderate): Secondary | ICD-10-CM | POA: Diagnosis not present

## 2016-03-08 DIAGNOSIS — E440 Moderate protein-calorie malnutrition: Secondary | ICD-10-CM | POA: Diagnosis not present

## 2016-03-08 DIAGNOSIS — Z4789 Encounter for other orthopedic aftercare: Secondary | ICD-10-CM | POA: Diagnosis not present

## 2016-03-08 DIAGNOSIS — L89322 Pressure ulcer of left buttock, stage 2: Secondary | ICD-10-CM | POA: Diagnosis not present

## 2016-03-08 DIAGNOSIS — M4726 Other spondylosis with radiculopathy, lumbar region: Secondary | ICD-10-CM | POA: Diagnosis not present

## 2016-03-10 ENCOUNTER — Encounter: Payer: Medicare Other | Admitting: Physical Medicine & Rehabilitation

## 2016-03-10 DIAGNOSIS — N183 Chronic kidney disease, stage 3 (moderate): Secondary | ICD-10-CM | POA: Diagnosis not present

## 2016-03-10 DIAGNOSIS — Z4789 Encounter for other orthopedic aftercare: Secondary | ICD-10-CM | POA: Diagnosis not present

## 2016-03-10 DIAGNOSIS — M4726 Other spondylosis with radiculopathy, lumbar region: Secondary | ICD-10-CM | POA: Diagnosis not present

## 2016-03-10 DIAGNOSIS — I129 Hypertensive chronic kidney disease with stage 1 through stage 4 chronic kidney disease, or unspecified chronic kidney disease: Secondary | ICD-10-CM | POA: Diagnosis not present

## 2016-03-10 DIAGNOSIS — E440 Moderate protein-calorie malnutrition: Secondary | ICD-10-CM | POA: Diagnosis not present

## 2016-03-10 DIAGNOSIS — L89322 Pressure ulcer of left buttock, stage 2: Secondary | ICD-10-CM | POA: Diagnosis not present

## 2016-03-16 DIAGNOSIS — I129 Hypertensive chronic kidney disease with stage 1 through stage 4 chronic kidney disease, or unspecified chronic kidney disease: Secondary | ICD-10-CM | POA: Diagnosis not present

## 2016-03-16 DIAGNOSIS — N183 Chronic kidney disease, stage 3 (moderate): Secondary | ICD-10-CM | POA: Diagnosis not present

## 2016-03-16 DIAGNOSIS — Z4789 Encounter for other orthopedic aftercare: Secondary | ICD-10-CM | POA: Diagnosis not present

## 2016-03-16 DIAGNOSIS — L89322 Pressure ulcer of left buttock, stage 2: Secondary | ICD-10-CM | POA: Diagnosis not present

## 2016-03-16 DIAGNOSIS — M4726 Other spondylosis with radiculopathy, lumbar region: Secondary | ICD-10-CM | POA: Diagnosis not present

## 2016-03-16 DIAGNOSIS — E440 Moderate protein-calorie malnutrition: Secondary | ICD-10-CM | POA: Diagnosis not present

## 2016-03-17 NOTE — Telephone Encounter (Signed)
Routing to dr plotnikov, please advise, thanks 

## 2016-03-17 NOTE — Telephone Encounter (Signed)
Pt called and said that is still experiencing the bladder issues. He wanted to know if he could get another prescription to help clear this up. Please advise. Thanks [employer]

## 2016-03-18 ENCOUNTER — Other Ambulatory Visit: Payer: Self-pay

## 2016-03-18 DIAGNOSIS — Z4789 Encounter for other orthopedic aftercare: Secondary | ICD-10-CM | POA: Diagnosis not present

## 2016-03-18 DIAGNOSIS — I129 Hypertensive chronic kidney disease with stage 1 through stage 4 chronic kidney disease, or unspecified chronic kidney disease: Secondary | ICD-10-CM | POA: Diagnosis not present

## 2016-03-18 DIAGNOSIS — E440 Moderate protein-calorie malnutrition: Secondary | ICD-10-CM | POA: Diagnosis not present

## 2016-03-18 DIAGNOSIS — N183 Chronic kidney disease, stage 3 (moderate): Secondary | ICD-10-CM | POA: Diagnosis not present

## 2016-03-18 DIAGNOSIS — M4726 Other spondylosis with radiculopathy, lumbar region: Secondary | ICD-10-CM | POA: Diagnosis not present

## 2016-03-18 DIAGNOSIS — L89322 Pressure ulcer of left buttock, stage 2: Secondary | ICD-10-CM | POA: Diagnosis not present

## 2016-03-18 MED ORDER — NITROFURANTOIN MONOHYD MACRO 100 MG PO CAPS: 100.0000 mg | ORAL_CAPSULE | Freq: Two times a day (BID) | ORAL | 0 refills | Status: DC

## 2016-03-18 NOTE — Progress Notes (Unsigned)
m °

## 2016-03-18 NOTE — Telephone Encounter (Signed)
[facility]/patient advised

## 2016-03-18 NOTE — Telephone Encounter (Signed)
OK to email Rx for Macrobid x 14 days Thx

## 2016-03-21 DIAGNOSIS — Z4789 Encounter for other orthopedic aftercare: Secondary | ICD-10-CM | POA: Diagnosis not present

## 2016-03-21 DIAGNOSIS — M4726 Other spondylosis with radiculopathy, lumbar region: Secondary | ICD-10-CM | POA: Diagnosis not present

## 2016-03-21 DIAGNOSIS — I129 Hypertensive chronic kidney disease with stage 1 through stage 4 chronic kidney disease, or unspecified chronic kidney disease: Secondary | ICD-10-CM | POA: Diagnosis not present

## 2016-03-21 DIAGNOSIS — E440 Moderate protein-calorie malnutrition: Secondary | ICD-10-CM | POA: Diagnosis not present

## 2016-03-21 DIAGNOSIS — L89322 Pressure ulcer of left buttock, stage 2: Secondary | ICD-10-CM | POA: Diagnosis not present

## 2016-03-21 DIAGNOSIS — N183 Chronic kidney disease, stage 3 (moderate): Secondary | ICD-10-CM | POA: Diagnosis not present

## 2016-03-22 ENCOUNTER — Encounter: Payer: Self-pay | Admitting: Internal Medicine

## 2016-03-22 ENCOUNTER — Ambulatory Visit (INDEPENDENT_AMBULATORY_CARE_PROVIDER_SITE_OTHER)
Admission: RE | Admit: 2016-03-22 | Discharge: 2016-03-22 | Disposition: A | Discharge status: Home / Self Care | Payer: Medicare Other | Source: Ambulatory Visit | Attending: Internal Medicine | Admitting: Internal Medicine

## 2016-03-22 ENCOUNTER — Ambulatory Visit (INDEPENDENT_AMBULATORY_CARE_PROVIDER_SITE_OTHER): Payer: Medicare Other | Admitting: Internal Medicine

## 2016-03-22 DIAGNOSIS — R269 Unspecified abnormalities of gait and mobility: Secondary | ICD-10-CM | POA: Diagnosis not present

## 2016-03-22 DIAGNOSIS — M4726 Other spondylosis with radiculopathy, lumbar region: Secondary | ICD-10-CM | POA: Diagnosis not present

## 2016-03-22 DIAGNOSIS — R195 Other fecal abnormalities: Secondary | ICD-10-CM

## 2016-03-22 DIAGNOSIS — E440 Moderate protein-calorie malnutrition: Secondary | ICD-10-CM | POA: Diagnosis not present

## 2016-03-22 DIAGNOSIS — R634 Abnormal weight loss: Secondary | ICD-10-CM

## 2016-03-22 DIAGNOSIS — I129 Hypertensive chronic kidney disease with stage 1 through stage 4 chronic kidney disease, or unspecified chronic kidney disease: Secondary | ICD-10-CM | POA: Diagnosis not present

## 2016-03-22 DIAGNOSIS — K5900 Constipation, unspecified: Secondary | ICD-10-CM | POA: Diagnosis not present

## 2016-03-22 DIAGNOSIS — R531 Weakness: Secondary | ICD-10-CM | POA: Diagnosis not present

## 2016-03-22 DIAGNOSIS — L89322 Pressure ulcer of left buttock, stage 2: Secondary | ICD-10-CM | POA: Diagnosis not present

## 2016-03-22 DIAGNOSIS — E46 Unspecified protein-calorie malnutrition: Secondary | ICD-10-CM | POA: Diagnosis not present

## 2016-03-22 DIAGNOSIS — Z4789 Encounter for other orthopedic aftercare: Secondary | ICD-10-CM | POA: Diagnosis not present

## 2016-03-22 DIAGNOSIS — N183 Chronic kidney disease, stage 3 (moderate): Secondary | ICD-10-CM | POA: Diagnosis not present

## 2016-03-22 NOTE — Progress Notes (Signed)
Pre-visit discussion using our clinic review tool. No additional management support is needed unless otherwise documented below in the visit note.  

## 2016-03-22 NOTE — Assessment & Plan Note (Signed)
Slow improving LE paresis

## 2016-03-22 NOTE — Assessment & Plan Note (Signed)
Diarrhea and constipation - alternating Abd X ray. Consider Linzess

## 2016-03-22 NOTE — Progress Notes (Signed)
Subjective:  Patient ID: Marvin Morrison, male    DOB: 1940-03-22  Age: 76 y.o. MRN: [id number]  CC: Follow-up (stable walking, PT, back pain, gout, HTn, )   HPI Marvin Morrison presents for LEs weakness - better- walked 600 ft this week C/o loose stools/constipation - had a large stool once that clogged a toilet - otherwise loose stool F/u GERD   Outpatient Medications Prior to Visit  Medication Sig Dispense Refill  . acyclovir (ZOVIRAX) 400 MG tablet Take 1 tablet (400 mg total) by mouth 2 (two) times daily. 60 tablet 11  . colchicine 0.6 MG tablet Take 1 tablet (0.6 mg total) by mouth daily. 30 tablet 2  . ferrous sulfate 325 (65 FE) MG tablet TAKE 1 TABLET BY MOUTH EVERY DAY 30 tablet 5  . furosemide (LASIX) 20 MG tablet Take 1-2 tablets (20-40 mg total) by mouth daily as needed. 60 tablet 3  . gabapentin (NEURONTIN) 100 MG capsule Take 1 capsule (100 mg total) by mouth 3 (three) times daily. 90 capsule 11  . HYDROcodone-acetaminophen (NORCO/VICODIN) 5-325 MG tablet Take 1 tablet by mouth every 6 (six) hours as needed (mild pain). 100 tablet 0  . lidocaine (LIDODERM) 5 % Place 1 patch onto the skin daily. Remove & Discard patch within 12 hours or as directed by MD 30 patch 0  . methocarbamol (ROBAXIN) 750 MG tablet Take 1 tablet (750 mg total) by mouth every 6 (six) hours as needed for muscle spasms. 60 tablet 0  . metoprolol succinate (TOPROL-XL) 50 MG 24 hr tablet Take 1 tablet (50 mg total) by mouth daily. Take with or immediately following a meal. 90 tablet 3  . Multiple Vitamin (MULTIVITAMIN WITH MINERALS) TABS tablet Take 1 tablet by mouth daily.    . nitrofurantoin, macrocrystal-monohydrate, (MACROBID) 100 MG capsule Take 1 capsule (100 mg total) by mouth 2 (two) times daily. 14 capsule 0  . ondansetron (ZOFRAN) 4 MG tablet Take 1 tablet (4 mg total) by mouth daily. 20 tablet 2  . pantoprazole (PROTONIX) 40 MG tablet Take 1 tablet (40 mg total) by mouth daily. 90 tablet 2  .  tamsulosin (FLOMAX) 0.4 MG CAPS capsule Take 1 capsule (0.4 mg total) by mouth daily. 30 capsule 11  . vitamin C (VITAMIN C) 250 MG tablet Take 1 tablet (250 mg total) by mouth 3 (three) times daily with meals.    . ferrous sulfate 325 (65 FE) MG tablet Take 1 tablet (325 mg total) by mouth 3 (three) times daily with meals. (Patient not taking: Reported on 02/25/2016) 90 tablet 3   No facility-administered medications prior to visit.     ROS Review of Systems  Constitutional: Negative for appetite change, fatigue and unexpected weight change.  HENT: Negative for congestion, nosebleeds, sneezing, sore throat and trouble swallowing.   Eyes: Negative for itching and visual disturbance.  Respiratory: Negative for cough.   Cardiovascular: Negative for chest pain, palpitations and leg swelling.  Gastrointestinal: Positive for constipation and diarrhea. Negative for abdominal distention, blood in stool and nausea.  Genitourinary: Negative for frequency and hematuria.  Musculoskeletal: Negative for back pain, gait problem, joint swelling and neck pain.  Skin: Negative for color change and rash.  Neurological: Positive for weakness. Negative for dizziness, tremors and speech difficulty.  Psychiatric/Behavioral: Negative for agitation, decreased concentration, dysphoric mood and sleep disturbance. The patient is not nervous/anxious.     Objective:  BP 100/62   Pulse 96   Temp 98.5 F (36.9 C) (  Oral)   Resp 16   Ht 6' (1.829 m)   Wt 157 lb (71.2 kg)   SpO2 92%   BMI 21.29 kg/m   BP Readings from Last 3 Encounters:  03/22/16 100/62  02/25/16 (!) 144/76  02/11/16 112/75    Wt Readings from Last 3 Encounters:  03/22/16 157 lb (71.2 kg)  02/25/16 168 lb (76.2 kg)  02/03/16 179 lb 10.8 oz (81.5 kg)    Physical Exam  Constitutional: He is oriented to person, place, and time. He appears well-developed. No distress.  NAD  HENT:  Mouth/Throat: Oropharynx is clear and moist.  Eyes:  Conjunctivae are normal. Pupils are equal, round, and reactive to light.  Neck: Normal range of motion. No JVD present. No thyromegaly present.  Cardiovascular: Normal rate, regular rhythm, normal heart sounds and intact distal pulses.  Exam reveals no gallop and no friction rub.   No murmur heard. Pulmonary/Chest: Effort normal and breath sounds normal. No respiratory distress. He has no wheezes. He has no rales. He exhibits no tenderness.  Abdominal: Soft. Bowel sounds are normal. He exhibits no distension and no mass. There is no tenderness. There is no rebound and no guarding.  Musculoskeletal: Normal range of motion. He exhibits tenderness. He exhibits no edema.  Lymphadenopathy:    He has no cervical adenopathy.  Neurological: He is alert and oriented to person, place, and time. He has normal reflexes. No cranial nerve deficit. He exhibits normal muscle tone. He displays a negative Romberg sign. Coordination and gait normal.  Skin: Skin is warm and dry. No rash noted.  Psychiatric: He has a normal mood and affect. His behavior is normal. Judgment and thought content normal.  in a w/c  Lab Results  Component Value Date   WBC 12.5 (H) 02/25/2016   HGB 9.5 (L) 02/25/2016   HCT 28.6 (L) 02/25/2016   PLT 381.0 02/25/2016   GLUCOSE 165 (H) 02/25/2016   CHOL 154 12/24/2013   TRIG 287.0 (H) 12/24/2013   HDL 18.10 (L) 12/24/2013   LDLDIRECT 92.1 12/24/2013   LDLCALC 102 (H) 06/13/2013   ALT 21 02/25/2016   AST 15 02/25/2016   NA 136 02/25/2016   K 3.7 02/25/2016   CL 101 02/25/2016   CREATININE 1.33 02/25/2016   BUN 31 (H) 02/25/2016   CO2 27 02/25/2016   TSH 1.27 02/25/2016   PSA 0.58 12/24/2013   HGBA1C 5.8 04/18/2014    Dg Chest 2 View  Result Date: 01/13/2016 CLINICAL DATA:  Elevated white blood cell count following lumbar surgery 2 days ago. EXAM: CHEST  2 VIEW COMPARISON:  Portable chest x-ray of January 12, 2016. FINDINGS: The lungs are adequately inflated. The  interstitial markings remain coarse bilaterally similar to those seen yesterday. The heart is top-normal in size. The central pulmonary vascularity is mildly prominent but also stable. There is a small amount of blunting of the posterior costophrenic angles. There is calcification in the wall of the aortic arch. The bony thorax exhibits no acute abnormality. IMPRESSION: Interstitial prominence bilaterally not greatly changed since yesterday's study but significantly changed since April of 2016. This may reflect mild interstitial edema or interstitial pneumonia. There is no alveolar pneumonia. Tiny bilateral pleural effusions layering posteriorly. Thoracic aortic atherosclerosis. Electronically Signed   By: David  Martinique M.D.   On: 01/13/2016 10:14    Assessment & Plan:   There are no diagnoses linked to this encounter. I am having Mr. Camey maintain his acyclovir, lidocaine, methocarbamol, multivitamin with minerals,  pantoprazole, ascorbic acid, colchicine, HYDROcodone-acetaminophen, furosemide, metoprolol succinate, ondansetron, ferrous sulfate, gabapentin, tamsulosin, and nitrofurantoin (macrocrystal-monohydrate).  No orders of the defined types were placed in this encounter.    Follow-up: No Follow-up on file.  Walker Kehr, MD

## 2016-03-22 NOTE — Assessment & Plan Note (Signed)
Better  

## 2016-03-22 NOTE — Assessment & Plan Note (Signed)
Wt Readings from Last 3 Encounters:  03/22/16 157 lb (71.2 kg)  02/25/16 168 lb (76.2 kg)  02/03/16 179 lb 10.8 oz (81.5 kg)

## 2016-03-22 NOTE — Assessment & Plan Note (Signed)
Doing better.   

## 2016-03-24 DIAGNOSIS — Z4789 Encounter for other orthopedic aftercare: Secondary | ICD-10-CM | POA: Diagnosis not present

## 2016-03-24 DIAGNOSIS — I129 Hypertensive chronic kidney disease with stage 1 through stage 4 chronic kidney disease, or unspecified chronic kidney disease: Secondary | ICD-10-CM | POA: Diagnosis not present

## 2016-03-24 DIAGNOSIS — M4726 Other spondylosis with radiculopathy, lumbar region: Secondary | ICD-10-CM | POA: Diagnosis not present

## 2016-03-24 DIAGNOSIS — E440 Moderate protein-calorie malnutrition: Secondary | ICD-10-CM | POA: Diagnosis not present

## 2016-03-24 DIAGNOSIS — L89322 Pressure ulcer of left buttock, stage 2: Secondary | ICD-10-CM | POA: Diagnosis not present

## 2016-03-24 DIAGNOSIS — N183 Chronic kidney disease, stage 3 (moderate): Secondary | ICD-10-CM | POA: Diagnosis not present

## 2016-03-28 ENCOUNTER — Telehealth: Payer: Self-pay | Admitting: Internal Medicine

## 2016-03-28 NOTE — Telephone Encounter (Signed)
Patient wanted to let you know the Linzess samples give have provided him with comfort and is doing well. Is finishing up the last two pills, had concern if he should stop but advise patient to finish the medication given. Also the patient had a concern if he could get 30 tablets of Zofran 4 mg instead of 20 tablets. He stated that what was discussed previously and would like it to be sent to his pharmacy. Please advise

## 2016-03-28 NOTE — Telephone Encounter (Signed)
Patient needs Dr. Judeen Hammans nurse to give him a call back. He has some questions about his medications. He is saying the pharmacy only gave him 20 tablets of one. I am unclear which one that is I think he is talking about the macrobid. But he said no its not that. Could you please follow up with patient. Thank you.

## 2016-03-29 DIAGNOSIS — N183 Chronic kidney disease, stage 3 (moderate): Secondary | ICD-10-CM | POA: Diagnosis not present

## 2016-03-29 DIAGNOSIS — Z4789 Encounter for other orthopedic aftercare: Secondary | ICD-10-CM | POA: Diagnosis not present

## 2016-03-29 DIAGNOSIS — L89322 Pressure ulcer of left buttock, stage 2: Secondary | ICD-10-CM | POA: Diagnosis not present

## 2016-03-29 DIAGNOSIS — I129 Hypertensive chronic kidney disease with stage 1 through stage 4 chronic kidney disease, or unspecified chronic kidney disease: Secondary | ICD-10-CM | POA: Diagnosis not present

## 2016-03-29 DIAGNOSIS — E440 Moderate protein-calorie malnutrition: Secondary | ICD-10-CM | POA: Diagnosis not present

## 2016-03-29 DIAGNOSIS — M4726 Other spondylosis with radiculopathy, lumbar region: Secondary | ICD-10-CM | POA: Diagnosis not present

## 2016-03-29 MED ORDER — LINACLOTIDE 290 MCG PO CAPS: 290.0000 ug | ORAL_CAPSULE | Freq: Every day | ORAL | 1 refills | Status: DC

## 2016-03-29 MED ORDER — ONDANSETRON HCL 4 MG PO TABS: 4.0000 mg | ORAL_TABLET | Freq: Every day | ORAL | 3 refills | Status: DC

## 2016-03-29 NOTE — Telephone Encounter (Signed)
Ok Zofran Rx - do not take every day Fill Linzess Rx if needed Thx

## 2016-03-29 NOTE — Telephone Encounter (Signed)
Ondansetron 4 mg and Linaclotide 290 mcg sent to [facility]

## 2016-03-30 ENCOUNTER — Ambulatory Visit: Payer: Medicare Other | Admitting: Internal Medicine

## 2016-03-31 ENCOUNTER — Telehealth: Payer: Self-pay | Admitting: Internal Medicine

## 2016-03-31 DIAGNOSIS — L89322 Pressure ulcer of left buttock, stage 2: Secondary | ICD-10-CM | POA: Diagnosis not present

## 2016-03-31 DIAGNOSIS — N183 Chronic kidney disease, stage 3 (moderate): Secondary | ICD-10-CM | POA: Diagnosis not present

## 2016-03-31 DIAGNOSIS — I129 Hypertensive chronic kidney disease with stage 1 through stage 4 chronic kidney disease, or unspecified chronic kidney disease: Secondary | ICD-10-CM | POA: Diagnosis not present

## 2016-03-31 DIAGNOSIS — M4726 Other spondylosis with radiculopathy, lumbar region: Secondary | ICD-10-CM | POA: Diagnosis not present

## 2016-03-31 DIAGNOSIS — Z4789 Encounter for other orthopedic aftercare: Secondary | ICD-10-CM | POA: Diagnosis not present

## 2016-03-31 DIAGNOSIS — E440 Moderate protein-calorie malnutrition: Secondary | ICD-10-CM | POA: Diagnosis not present

## 2016-03-31 NOTE — Telephone Encounter (Signed)
therapists called back and made a mistake, his certification runs out 04/07/2016, and would like to get it recertified then and then do 2x4 after this. Please advise.

## 2016-03-31 NOTE — Telephone Encounter (Signed)
Physical therapists called stating his certification will be ending next week and wants to know if he can recertify him for  2x4

## 2016-04-02 NOTE — Telephone Encounter (Signed)
OK. Thx

## 2016-04-05 ENCOUNTER — Ambulatory Visit: Payer: Medicare Other | Admitting: Internal Medicine

## 2016-04-05 DIAGNOSIS — M4726 Other spondylosis with radiculopathy, lumbar region: Secondary | ICD-10-CM | POA: Diagnosis not present

## 2016-04-05 DIAGNOSIS — L89322 Pressure ulcer of left buttock, stage 2: Secondary | ICD-10-CM | POA: Diagnosis not present

## 2016-04-05 DIAGNOSIS — E440 Moderate protein-calorie malnutrition: Secondary | ICD-10-CM | POA: Diagnosis not present

## 2016-04-05 DIAGNOSIS — N183 Chronic kidney disease, stage 3 (moderate): Secondary | ICD-10-CM | POA: Diagnosis not present

## 2016-04-05 DIAGNOSIS — Z4789 Encounter for other orthopedic aftercare: Secondary | ICD-10-CM | POA: Diagnosis not present

## 2016-04-05 DIAGNOSIS — I129 Hypertensive chronic kidney disease with stage 1 through stage 4 chronic kidney disease, or unspecified chronic kidney disease: Secondary | ICD-10-CM | POA: Diagnosis not present

## 2016-04-06 DIAGNOSIS — N183 Chronic kidney disease, stage 3 (moderate): Secondary | ICD-10-CM | POA: Diagnosis not present

## 2016-04-06 DIAGNOSIS — M4726 Other spondylosis with radiculopathy, lumbar region: Secondary | ICD-10-CM | POA: Diagnosis not present

## 2016-04-06 DIAGNOSIS — E440 Moderate protein-calorie malnutrition: Secondary | ICD-10-CM | POA: Diagnosis not present

## 2016-04-06 DIAGNOSIS — I129 Hypertensive chronic kidney disease with stage 1 through stage 4 chronic kidney disease, or unspecified chronic kidney disease: Secondary | ICD-10-CM | POA: Diagnosis not present

## 2016-04-06 DIAGNOSIS — Z4789 Encounter for other orthopedic aftercare: Secondary | ICD-10-CM | POA: Diagnosis not present

## 2016-04-06 DIAGNOSIS — L89322 Pressure ulcer of left buttock, stage 2: Secondary | ICD-10-CM | POA: Diagnosis not present

## 2016-04-07 DIAGNOSIS — Z4789 Encounter for other orthopedic aftercare: Secondary | ICD-10-CM | POA: Diagnosis not present

## 2016-04-07 DIAGNOSIS — E440 Moderate protein-calorie malnutrition: Secondary | ICD-10-CM | POA: Diagnosis not present

## 2016-04-07 DIAGNOSIS — N183 Chronic kidney disease, stage 3 (moderate): Secondary | ICD-10-CM | POA: Diagnosis not present

## 2016-04-07 DIAGNOSIS — I129 Hypertensive chronic kidney disease with stage 1 through stage 4 chronic kidney disease, or unspecified chronic kidney disease: Secondary | ICD-10-CM | POA: Diagnosis not present

## 2016-04-07 DIAGNOSIS — M4726 Other spondylosis with radiculopathy, lumbar region: Secondary | ICD-10-CM | POA: Diagnosis not present

## 2016-04-07 DIAGNOSIS — L89322 Pressure ulcer of left buttock, stage 2: Secondary | ICD-10-CM | POA: Diagnosis not present

## 2016-04-08 DIAGNOSIS — R339 Retention of urine, unspecified: Secondary | ICD-10-CM | POA: Diagnosis not present

## 2016-04-08 DIAGNOSIS — L89322 Pressure ulcer of left buttock, stage 2: Secondary | ICD-10-CM | POA: Diagnosis not present

## 2016-04-08 DIAGNOSIS — E440 Moderate protein-calorie malnutrition: Secondary | ICD-10-CM | POA: Diagnosis not present

## 2016-04-08 DIAGNOSIS — K5900 Constipation, unspecified: Secondary | ICD-10-CM | POA: Diagnosis not present

## 2016-04-08 DIAGNOSIS — M4726 Other spondylosis with radiculopathy, lumbar region: Secondary | ICD-10-CM | POA: Diagnosis not present

## 2016-04-08 DIAGNOSIS — Z4789 Encounter for other orthopedic aftercare: Secondary | ICD-10-CM | POA: Diagnosis not present

## 2016-04-08 DIAGNOSIS — I129 Hypertensive chronic kidney disease with stage 1 through stage 4 chronic kidney disease, or unspecified chronic kidney disease: Secondary | ICD-10-CM | POA: Diagnosis not present

## 2016-04-08 DIAGNOSIS — N183 Chronic kidney disease, stage 3 (moderate): Secondary | ICD-10-CM | POA: Diagnosis not present

## 2016-04-13 DIAGNOSIS — M4726 Other spondylosis with radiculopathy, lumbar region: Secondary | ICD-10-CM | POA: Diagnosis not present

## 2016-04-13 DIAGNOSIS — E440 Moderate protein-calorie malnutrition: Secondary | ICD-10-CM | POA: Diagnosis not present

## 2016-04-13 DIAGNOSIS — I129 Hypertensive chronic kidney disease with stage 1 through stage 4 chronic kidney disease, or unspecified chronic kidney disease: Secondary | ICD-10-CM | POA: Diagnosis not present

## 2016-04-13 DIAGNOSIS — N183 Chronic kidney disease, stage 3 (moderate): Secondary | ICD-10-CM | POA: Diagnosis not present

## 2016-04-13 DIAGNOSIS — L89322 Pressure ulcer of left buttock, stage 2: Secondary | ICD-10-CM | POA: Diagnosis not present

## 2016-04-13 DIAGNOSIS — Z4789 Encounter for other orthopedic aftercare: Secondary | ICD-10-CM | POA: Diagnosis not present

## 2016-04-15 DIAGNOSIS — N183 Chronic kidney disease, stage 3 (moderate): Secondary | ICD-10-CM | POA: Diagnosis not present

## 2016-04-15 DIAGNOSIS — Z4789 Encounter for other orthopedic aftercare: Secondary | ICD-10-CM | POA: Diagnosis not present

## 2016-04-15 DIAGNOSIS — E440 Moderate protein-calorie malnutrition: Secondary | ICD-10-CM | POA: Diagnosis not present

## 2016-04-15 DIAGNOSIS — M4726 Other spondylosis with radiculopathy, lumbar region: Secondary | ICD-10-CM | POA: Diagnosis not present

## 2016-04-15 DIAGNOSIS — I129 Hypertensive chronic kidney disease with stage 1 through stage 4 chronic kidney disease, or unspecified chronic kidney disease: Secondary | ICD-10-CM | POA: Diagnosis not present

## 2016-04-15 DIAGNOSIS — L89322 Pressure ulcer of left buttock, stage 2: Secondary | ICD-10-CM | POA: Diagnosis not present

## 2016-04-19 DIAGNOSIS — L89322 Pressure ulcer of left buttock, stage 2: Secondary | ICD-10-CM | POA: Diagnosis not present

## 2016-04-19 DIAGNOSIS — N183 Chronic kidney disease, stage 3 (moderate): Secondary | ICD-10-CM | POA: Diagnosis not present

## 2016-04-19 DIAGNOSIS — E440 Moderate protein-calorie malnutrition: Secondary | ICD-10-CM | POA: Diagnosis not present

## 2016-04-19 DIAGNOSIS — Z4789 Encounter for other orthopedic aftercare: Secondary | ICD-10-CM | POA: Diagnosis not present

## 2016-04-19 DIAGNOSIS — M4726 Other spondylosis with radiculopathy, lumbar region: Secondary | ICD-10-CM | POA: Diagnosis not present

## 2016-04-19 DIAGNOSIS — I129 Hypertensive chronic kidney disease with stage 1 through stage 4 chronic kidney disease, or unspecified chronic kidney disease: Secondary | ICD-10-CM | POA: Diagnosis not present

## 2016-04-20 DIAGNOSIS — L89322 Pressure ulcer of left buttock, stage 2: Secondary | ICD-10-CM | POA: Diagnosis not present

## 2016-04-20 DIAGNOSIS — N183 Chronic kidney disease, stage 3 (moderate): Secondary | ICD-10-CM | POA: Diagnosis not present

## 2016-04-20 DIAGNOSIS — Z4789 Encounter for other orthopedic aftercare: Secondary | ICD-10-CM | POA: Diagnosis not present

## 2016-04-20 DIAGNOSIS — E440 Moderate protein-calorie malnutrition: Secondary | ICD-10-CM | POA: Diagnosis not present

## 2016-04-20 DIAGNOSIS — I129 Hypertensive chronic kidney disease with stage 1 through stage 4 chronic kidney disease, or unspecified chronic kidney disease: Secondary | ICD-10-CM | POA: Diagnosis not present

## 2016-04-20 DIAGNOSIS — M4726 Other spondylosis with radiculopathy, lumbar region: Secondary | ICD-10-CM | POA: Diagnosis not present

## 2016-04-22 DIAGNOSIS — M4726 Other spondylosis with radiculopathy, lumbar region: Secondary | ICD-10-CM | POA: Diagnosis not present

## 2016-04-22 DIAGNOSIS — I129 Hypertensive chronic kidney disease with stage 1 through stage 4 chronic kidney disease, or unspecified chronic kidney disease: Secondary | ICD-10-CM | POA: Diagnosis not present

## 2016-04-22 DIAGNOSIS — Z4789 Encounter for other orthopedic aftercare: Secondary | ICD-10-CM | POA: Diagnosis not present

## 2016-04-22 DIAGNOSIS — N183 Chronic kidney disease, stage 3 (moderate): Secondary | ICD-10-CM | POA: Diagnosis not present

## 2016-04-22 DIAGNOSIS — L89322 Pressure ulcer of left buttock, stage 2: Secondary | ICD-10-CM | POA: Diagnosis not present

## 2016-04-22 DIAGNOSIS — E440 Moderate protein-calorie malnutrition: Secondary | ICD-10-CM | POA: Diagnosis not present

## 2016-04-27 DIAGNOSIS — M4726 Other spondylosis with radiculopathy, lumbar region: Secondary | ICD-10-CM | POA: Diagnosis not present

## 2016-04-27 DIAGNOSIS — Z4789 Encounter for other orthopedic aftercare: Secondary | ICD-10-CM | POA: Diagnosis not present

## 2016-04-27 DIAGNOSIS — E440 Moderate protein-calorie malnutrition: Secondary | ICD-10-CM | POA: Diagnosis not present

## 2016-04-27 DIAGNOSIS — I129 Hypertensive chronic kidney disease with stage 1 through stage 4 chronic kidney disease, or unspecified chronic kidney disease: Secondary | ICD-10-CM | POA: Diagnosis not present

## 2016-04-27 DIAGNOSIS — N183 Chronic kidney disease, stage 3 (moderate): Secondary | ICD-10-CM | POA: Diagnosis not present

## 2016-04-27 DIAGNOSIS — L89322 Pressure ulcer of left buttock, stage 2: Secondary | ICD-10-CM | POA: Diagnosis not present

## 2016-04-28 DIAGNOSIS — N183 Chronic kidney disease, stage 3 (moderate): Secondary | ICD-10-CM | POA: Diagnosis not present

## 2016-04-28 DIAGNOSIS — Z4789 Encounter for other orthopedic aftercare: Secondary | ICD-10-CM | POA: Diagnosis not present

## 2016-04-28 DIAGNOSIS — L89322 Pressure ulcer of left buttock, stage 2: Secondary | ICD-10-CM | POA: Diagnosis not present

## 2016-04-28 DIAGNOSIS — E440 Moderate protein-calorie malnutrition: Secondary | ICD-10-CM | POA: Diagnosis not present

## 2016-04-28 DIAGNOSIS — I129 Hypertensive chronic kidney disease with stage 1 through stage 4 chronic kidney disease, or unspecified chronic kidney disease: Secondary | ICD-10-CM | POA: Diagnosis not present

## 2016-04-28 DIAGNOSIS — M4726 Other spondylosis with radiculopathy, lumbar region: Secondary | ICD-10-CM | POA: Diagnosis not present

## 2016-04-29 DIAGNOSIS — M4726 Other spondylosis with radiculopathy, lumbar region: Secondary | ICD-10-CM | POA: Diagnosis not present

## 2016-04-29 DIAGNOSIS — L89322 Pressure ulcer of left buttock, stage 2: Secondary | ICD-10-CM | POA: Diagnosis not present

## 2016-04-29 DIAGNOSIS — N183 Chronic kidney disease, stage 3 (moderate): Secondary | ICD-10-CM | POA: Diagnosis not present

## 2016-04-29 DIAGNOSIS — E440 Moderate protein-calorie malnutrition: Secondary | ICD-10-CM | POA: Diagnosis not present

## 2016-04-29 DIAGNOSIS — I129 Hypertensive chronic kidney disease with stage 1 through stage 4 chronic kidney disease, or unspecified chronic kidney disease: Secondary | ICD-10-CM | POA: Diagnosis not present

## 2016-04-29 DIAGNOSIS — Z4789 Encounter for other orthopedic aftercare: Secondary | ICD-10-CM | POA: Diagnosis not present

## 2016-05-02 DIAGNOSIS — I129 Hypertensive chronic kidney disease with stage 1 through stage 4 chronic kidney disease, or unspecified chronic kidney disease: Secondary | ICD-10-CM | POA: Diagnosis not present

## 2016-05-02 DIAGNOSIS — K5900 Constipation, unspecified: Secondary | ICD-10-CM | POA: Diagnosis not present

## 2016-05-02 DIAGNOSIS — E440 Moderate protein-calorie malnutrition: Secondary | ICD-10-CM | POA: Diagnosis not present

## 2016-05-02 DIAGNOSIS — R339 Retention of urine, unspecified: Secondary | ICD-10-CM | POA: Diagnosis not present

## 2016-05-02 DIAGNOSIS — L89322 Pressure ulcer of left buttock, stage 2: Secondary | ICD-10-CM | POA: Diagnosis not present

## 2016-05-02 DIAGNOSIS — N183 Chronic kidney disease, stage 3 (moderate): Secondary | ICD-10-CM | POA: Diagnosis not present

## 2016-05-02 DIAGNOSIS — Z4789 Encounter for other orthopedic aftercare: Secondary | ICD-10-CM | POA: Diagnosis not present

## 2016-05-02 DIAGNOSIS — M4726 Other spondylosis with radiculopathy, lumbar region: Secondary | ICD-10-CM | POA: Diagnosis not present

## 2016-05-03 DIAGNOSIS — M4726 Other spondylosis with radiculopathy, lumbar region: Secondary | ICD-10-CM | POA: Diagnosis not present

## 2016-05-03 DIAGNOSIS — Z4789 Encounter for other orthopedic aftercare: Secondary | ICD-10-CM | POA: Diagnosis not present

## 2016-05-03 DIAGNOSIS — I129 Hypertensive chronic kidney disease with stage 1 through stage 4 chronic kidney disease, or unspecified chronic kidney disease: Secondary | ICD-10-CM | POA: Diagnosis not present

## 2016-05-03 DIAGNOSIS — N183 Chronic kidney disease, stage 3 (moderate): Secondary | ICD-10-CM | POA: Diagnosis not present

## 2016-05-03 DIAGNOSIS — L89322 Pressure ulcer of left buttock, stage 2: Secondary | ICD-10-CM | POA: Diagnosis not present

## 2016-05-03 DIAGNOSIS — E440 Moderate protein-calorie malnutrition: Secondary | ICD-10-CM | POA: Diagnosis not present

## 2016-05-04 ENCOUNTER — Telehealth: Payer: Self-pay | Admitting: Internal Medicine

## 2016-05-04 DIAGNOSIS — M4726 Other spondylosis with radiculopathy, lumbar region: Secondary | ICD-10-CM | POA: Diagnosis not present

## 2016-05-04 DIAGNOSIS — N183 Chronic kidney disease, stage 3 (moderate): Secondary | ICD-10-CM | POA: Diagnosis not present

## 2016-05-04 DIAGNOSIS — L89322 Pressure ulcer of left buttock, stage 2: Secondary | ICD-10-CM | POA: Diagnosis not present

## 2016-05-04 DIAGNOSIS — E440 Moderate protein-calorie malnutrition: Secondary | ICD-10-CM | POA: Diagnosis not present

## 2016-05-04 DIAGNOSIS — I129 Hypertensive chronic kidney disease with stage 1 through stage 4 chronic kidney disease, or unspecified chronic kidney disease: Secondary | ICD-10-CM | POA: Diagnosis not present

## 2016-05-04 DIAGNOSIS — Z4789 Encounter for other orthopedic aftercare: Secondary | ICD-10-CM | POA: Diagnosis not present

## 2016-05-04 NOTE — Telephone Encounter (Signed)
Beth from [facility]  [phone]  Pt had a fall on Sunday , he went to pick something up off of the floor and fell out of his wheelchair.  No new pain just fyi

## 2016-05-04 NOTE — Telephone Encounter (Signed)
Noted. Thx.

## 2016-05-20 ENCOUNTER — Telehealth: Payer: Self-pay | Admitting: Internal Medicine

## 2016-05-20 DIAGNOSIS — N183 Chronic kidney disease, stage 3 (moderate): Secondary | ICD-10-CM | POA: Diagnosis not present

## 2016-05-20 DIAGNOSIS — L89322 Pressure ulcer of left buttock, stage 2: Secondary | ICD-10-CM | POA: Diagnosis not present

## 2016-05-20 DIAGNOSIS — E440 Moderate protein-calorie malnutrition: Secondary | ICD-10-CM | POA: Diagnosis not present

## 2016-05-20 DIAGNOSIS — M4726 Other spondylosis with radiculopathy, lumbar region: Secondary | ICD-10-CM | POA: Diagnosis not present

## 2016-05-20 DIAGNOSIS — Z4789 Encounter for other orthopedic aftercare: Secondary | ICD-10-CM | POA: Diagnosis not present

## 2016-05-20 DIAGNOSIS — I129 Hypertensive chronic kidney disease with stage 1 through stage 4 chronic kidney disease, or unspecified chronic kidney disease: Secondary | ICD-10-CM | POA: Diagnosis not present

## 2016-05-20 NOTE — Telephone Encounter (Signed)
FYI

## 2016-05-20 NOTE — Telephone Encounter (Signed)
Marvin Morrison with [facility]  [phone]  Pt had a fall last week , pt fell on his left hip.  Pt is complaining of left hip pain .  He has been taking some Norco 1 at night just to help with pain so he can sleep    Pt bp is running high  162/88

## 2016-05-20 NOTE — Telephone Encounter (Signed)
Noted - keep ROV Thx

## 2016-05-27 ENCOUNTER — Encounter: Payer: Self-pay | Admitting: Internal Medicine

## 2016-05-27 ENCOUNTER — Ambulatory Visit (INDEPENDENT_AMBULATORY_CARE_PROVIDER_SITE_OTHER): Payer: Medicare Other | Admitting: Internal Medicine

## 2016-05-27 ENCOUNTER — Other Ambulatory Visit (INDEPENDENT_AMBULATORY_CARE_PROVIDER_SITE_OTHER): Payer: Medicare Other

## 2016-05-27 VITALS — BP 128/82 | HR 93 | Temp 98.2°F | Ht 72.0 in | Wt 167.0 lb

## 2016-05-27 DIAGNOSIS — N183 Chronic kidney disease, stage 3 unspecified: Secondary | ICD-10-CM

## 2016-05-27 DIAGNOSIS — I10 Essential (primary) hypertension: Secondary | ICD-10-CM

## 2016-05-27 DIAGNOSIS — R739 Hyperglycemia, unspecified: Secondary | ICD-10-CM

## 2016-05-27 DIAGNOSIS — K5900 Constipation, unspecified: Secondary | ICD-10-CM | POA: Diagnosis not present

## 2016-05-27 DIAGNOSIS — D500 Iron deficiency anemia secondary to blood loss (chronic): Secondary | ICD-10-CM | POA: Diagnosis not present

## 2016-05-27 LAB — CBC WITH DIFFERENTIAL/PLATELET
BASOS ABS: 0 10*3/uL (ref 0.0–0.1)
Basophils Relative: 0.6 % (ref 0.0–3.0)
EOS PCT: 2.4 % (ref 0.0–5.0)
Eosinophils Absolute: 0.2 10*3/uL (ref 0.0–0.7)
HCT: 36.8 % — ABNORMAL LOW (ref 39.0–52.0)
HEMOGLOBIN: 12.5 g/dL — AB (ref 13.0–17.0)
LYMPHS ABS: 1.7 10*3/uL (ref 0.7–4.0)
Lymphocytes Relative: 20.1 % (ref 12.0–46.0)
MCHC: 33.9 g/dL (ref 30.0–36.0)
MCV: 96.2 fl (ref 78.0–100.0)
MONO ABS: 0.7 10*3/uL (ref 0.1–1.0)
MONOS PCT: 8.1 % (ref 3.0–12.0)
NEUTROS PCT: 68.8 % (ref 43.0–77.0)
Neutro Abs: 5.6 10*3/uL (ref 1.4–7.7)
Platelets: 230 10*3/uL (ref 150.0–400.0)
RBC: 3.83 Mil/uL — AB (ref 4.22–5.81)
RDW: 13.1 % (ref 11.5–15.5)
WBC: 8.2 10*3/uL (ref 4.0–10.5)

## 2016-05-27 LAB — IBC PANEL
IRON: 81 ug/dL (ref 42–165)
Saturation Ratios: 24.3 % (ref 20.0–50.0)
Transferrin: 238 mg/dL (ref 212.0–360.0)

## 2016-05-27 LAB — BASIC METABOLIC PANEL
BUN: 28 mg/dL — AB (ref 6–23)
CALCIUM: 9.1 mg/dL (ref 8.4–10.5)
CO2: 25 mEq/L (ref 19–32)
CREATININE: 1.47 mg/dL (ref 0.40–1.50)
Chloride: 100 mEq/L (ref 96–112)
GFR: 49.28 mL/min — AB (ref >60.00)
GLUCOSE: 160 mg/dL — AB (ref 70–99)
Potassium: 3.5 mEq/L (ref 3.5–5.1)
Sodium: 136 mEq/L (ref 135–145)

## 2016-05-27 LAB — HEPATIC FUNCTION PANEL
ALBUMIN: 4.2 g/dL (ref 3.5–5.2)
ALT: 15 U/L (ref 0–53)
AST: 15 U/L (ref 0–37)
Alkaline Phosphatase: 94 U/L (ref 39–117)
Bilirubin, Direct: 0.1 mg/dL (ref 0.0–0.3)
TOTAL PROTEIN: 6.7 g/dL (ref 6.0–8.3)
Total Bilirubin: 0.3 mg/dL (ref 0.2–1.2)

## 2016-05-27 LAB — TSH: TSH: 1.35 u[IU]/mL (ref 0.35–4.50)

## 2016-05-27 LAB — HEMOGLOBIN A1C: HEMOGLOBIN A1C: 5.6 % (ref 4.6–6.5)

## 2016-05-27 MED ORDER — HYDROCODONE-ACETAMINOPHEN 5-325 MG PO TABS: 1.0000 | ORAL_TABLET | Freq: Four times a day (QID) | ORAL | 0 refills | Status: DC | PRN

## 2016-05-27 NOTE — Assessment & Plan Note (Signed)
On Verapamil 

## 2016-05-27 NOTE — Progress Notes (Signed)
Subjective:  Patient ID: Marvin Morrison, male    DOB: 09-27-1940  Age: 76 y.o. MRN: [id number]  CC: No chief complaint on file.   HPI Marvin Morrison presents for LBP and LE paraparesis. Was doing well w/PT - fell 2 week ago and hit L hip - better now Able to walk w/a walker  Outpatient Medications Prior to Visit  Medication Sig Dispense Refill  . acyclovir (ZOVIRAX) 400 MG tablet Take 1 tablet (400 mg total) by mouth 2 (two) times daily. 60 tablet 11  . colchicine 0.6 MG tablet Take 1 tablet (0.6 mg total) by mouth daily. 30 tablet 2  . ferrous sulfate 325 (65 FE) MG tablet TAKE 1 TABLET BY MOUTH EVERY DAY 30 tablet 5  . furosemide (LASIX) 20 MG tablet Take 1-2 tablets (20-40 mg total) by mouth daily as needed. 60 tablet 3  . gabapentin (NEURONTIN) 100 MG capsule Take 1 capsule (100 mg total) by mouth 3 (three) times daily. 90 capsule 11  . HYDROcodone-acetaminophen (NORCO/VICODIN) 5-325 MG tablet Take 1 tablet by mouth every 6 (six) hours as needed (mild pain). 100 tablet 0  . lidocaine (LIDODERM) 5 % Place 1 patch onto the skin daily. Remove & Discard patch within 12 hours or as directed by MD 30 patch 0  . linaclotide (LINZESS) 290 MCG CAPS capsule Take 1 capsule (290 mcg total) by mouth daily before breakfast. As needed 30 capsule 1  . methocarbamol (ROBAXIN) 750 MG tablet Take 1 tablet (750 mg total) by mouth every 6 (six) hours as needed for muscle spasms. 60 tablet 0  . metoprolol succinate (TOPROL-XL) 50 MG 24 hr tablet Take 1 tablet (50 mg total) by mouth daily. Take with or immediately following a meal. 90 tablet 3  . Multiple Vitamin (MULTIVITAMIN WITH MINERALS) TABS tablet Take 1 tablet by mouth daily.    . nitrofurantoin, macrocrystal-monohydrate, (MACROBID) 100 MG capsule Take 1 capsule (100 mg total) by mouth 2 (two) times daily. 14 capsule 0  . ondansetron (ZOFRAN) 4 MG tablet Take 1 tablet (4 mg total) by mouth daily. 30 tablet 3  . pantoprazole (PROTONIX) 40 MG tablet Take  1 tablet (40 mg total) by mouth daily. 90 tablet 2  . tamsulosin (FLOMAX) 0.4 MG CAPS capsule Take 1 capsule (0.4 mg total) by mouth daily. 30 capsule 11  . vitamin C (VITAMIN C) 250 MG tablet Take 1 tablet (250 mg total) by mouth 3 (three) times daily with meals.     No facility-administered medications prior to visit.     ROS Review of Systems  Constitutional: Negative for appetite change, fatigue and unexpected weight change.  HENT: Negative for congestion, nosebleeds, sneezing, sore throat and trouble swallowing.   Eyes: Negative for itching and visual disturbance.  Respiratory: Negative for cough.   Cardiovascular: Negative for chest pain, palpitations and leg swelling.  Gastrointestinal: Negative for abdominal distention, blood in stool, diarrhea and nausea.  Genitourinary: Negative for frequency and hematuria.  Musculoskeletal: Positive for arthralgias, back pain and gait problem. Negative for joint swelling and neck pain.  Skin: Negative for rash.  Neurological: Positive for weakness. Negative for dizziness, tremors and speech difficulty.  Psychiatric/Behavioral: Negative for agitation, dysphoric mood and sleep disturbance. The patient is not nervous/anxious.     Objective:  BP 128/82 (BP Location: Left Arm, Patient Position: Sitting, Cuff Size: Normal)   Pulse 93   Temp 98.2 F (36.8 C) (Oral)   Ht 6' (1.829 m)   Wt 167  lb (75.8 kg)   SpO2 98%   BMI 22.65 kg/m   BP Readings from Last 3 Encounters:  05/27/16 128/82  03/22/16 100/62  02/25/16 (!) 144/76    Wt Readings from Last 3 Encounters:  05/27/16 167 lb (75.8 kg)  03/22/16 157 lb (71.2 kg)  02/25/16 168 lb (76.2 kg)    Physical Exam  Constitutional: He is oriented to person, place, and time. He appears well-developed. No distress.  NAD  HENT:  Mouth/Throat: Oropharynx is clear and moist.  Eyes: Conjunctivae are normal. Pupils are equal, round, and reactive to light.  Neck: Normal range of motion. No JVD  present. No thyromegaly present.  Cardiovascular: Normal rate, regular rhythm, normal heart sounds and intact distal pulses.  Exam reveals no gallop and no friction rub.   No murmur heard. Pulmonary/Chest: Effort normal and breath sounds normal. No respiratory distress. He has no wheezes. He has no rales. He exhibits no tenderness.  Abdominal: Soft. Bowel sounds are normal. He exhibits no distension and no mass. There is no tenderness. There is no rebound and no guarding.  Musculoskeletal: Normal range of motion. He exhibits tenderness. He exhibits no edema.  Lymphadenopathy:    He has no cervical adenopathy.  Neurological: He is alert and oriented to person, place, and time. He has normal reflexes. No cranial nerve deficit. He exhibits normal muscle tone. He displays a negative Romberg sign. Coordination abnormal. Gait normal.  Skin: Skin is warm and dry. No rash noted.  Psychiatric: He has a normal mood and affect. His behavior is normal. Judgment and thought content normal.  in a w/c  Lab Results  Component Value Date   WBC 12.5 (H) 02/25/2016   HGB 9.5 (L) 02/25/2016   HCT 28.6 (L) 02/25/2016   PLT 381.0 02/25/2016   GLUCOSE 165 (H) 02/25/2016   CHOL 154 12/24/2013   TRIG 287.0 (H) 12/24/2013   HDL 18.10 (L) 12/24/2013   LDLDIRECT 92.1 12/24/2013   LDLCALC 102 (H) 06/13/2013   ALT 21 02/25/2016   AST 15 02/25/2016   NA 136 02/25/2016   K 3.7 02/25/2016   CL 101 02/25/2016   CREATININE 1.33 02/25/2016   BUN 31 (H) 02/25/2016   CO2 27 02/25/2016   TSH 1.27 02/25/2016   PSA 0.58 12/24/2013   HGBA1C 5.8 04/18/2014    Dg Abd 2 Views  Result Date: 03/22/2016 CLINICAL DATA:  Constipation and diarrhea. EXAM: ABDOMEN - 2 VIEW COMPARISON:  01/27/2016. FINDINGS: Soft tissue structures are unremarkable. Prominent amount of stool noted throughout the colon suggesting constipation . Gas pattern is nonspecific. Degenerative changes of the thoracolumbar spine. No free air. IMPRESSION:  Prominent amount of stool noted throughout the colon suggesting constipation . No bowel distention. No acute abnormality identified. Electronically Signed   By: Marcello Moores  Register   On: 03/22/2016 11:37    Assessment & Plan:   There are no diagnoses linked to this encounter. I am having Mr. Peel maintain his acyclovir, lidocaine, methocarbamol, multivitamin with minerals, pantoprazole, ascorbic acid, colchicine, HYDROcodone-acetaminophen, furosemide, metoprolol succinate, ferrous sulfate, gabapentin, tamsulosin, nitrofurantoin (macrocrystal-monohydrate), ondansetron, and linaclotide.  No orders of the defined types were placed in this encounter.    Follow-up: No Follow-up on file.  Walker Kehr, MD

## 2016-05-27 NOTE — Assessment & Plan Note (Signed)
Labs

## 2016-05-27 NOTE — Assessment & Plan Note (Signed)
LOC prn

## 2016-05-27 NOTE — Assessment & Plan Note (Signed)
CBC Iron 

## 2016-05-31 ENCOUNTER — Telehealth: Payer: Self-pay | Admitting: Internal Medicine

## 2016-05-31 DIAGNOSIS — E440 Moderate protein-calorie malnutrition: Secondary | ICD-10-CM | POA: Diagnosis not present

## 2016-05-31 DIAGNOSIS — L89322 Pressure ulcer of left buttock, stage 2: Secondary | ICD-10-CM | POA: Diagnosis not present

## 2016-05-31 DIAGNOSIS — Z4789 Encounter for other orthopedic aftercare: Secondary | ICD-10-CM | POA: Diagnosis not present

## 2016-05-31 DIAGNOSIS — M4726 Other spondylosis with radiculopathy, lumbar region: Secondary | ICD-10-CM | POA: Diagnosis not present

## 2016-05-31 DIAGNOSIS — I129 Hypertensive chronic kidney disease with stage 1 through stage 4 chronic kidney disease, or unspecified chronic kidney disease: Secondary | ICD-10-CM | POA: Diagnosis not present

## 2016-05-31 DIAGNOSIS — N183 Chronic kidney disease, stage 3 (moderate): Secondary | ICD-10-CM | POA: Diagnosis not present

## 2016-05-31 NOTE — Telephone Encounter (Signed)
Let me know if no progress in 2-3 wks pls Thx

## 2016-05-31 NOTE — Telephone Encounter (Signed)
FYI

## 2016-05-31 NOTE — Telephone Encounter (Signed)
Called and believes he does not need any more Physical therapy.

## 2016-06-01 NOTE — Telephone Encounter (Signed)
Called Beth no answer LMOM w/MD response.../lmb 

## 2016-06-03 DIAGNOSIS — R339 Retention of urine, unspecified: Secondary | ICD-10-CM | POA: Diagnosis not present

## 2016-06-03 DIAGNOSIS — L89322 Pressure ulcer of left buttock, stage 2: Secondary | ICD-10-CM | POA: Diagnosis not present

## 2016-06-03 DIAGNOSIS — E440 Moderate protein-calorie malnutrition: Secondary | ICD-10-CM | POA: Diagnosis not present

## 2016-06-03 DIAGNOSIS — Z4789 Encounter for other orthopedic aftercare: Secondary | ICD-10-CM | POA: Diagnosis not present

## 2016-06-03 DIAGNOSIS — I129 Hypertensive chronic kidney disease with stage 1 through stage 4 chronic kidney disease, or unspecified chronic kidney disease: Secondary | ICD-10-CM | POA: Diagnosis not present

## 2016-06-03 DIAGNOSIS — K5900 Constipation, unspecified: Secondary | ICD-10-CM | POA: Diagnosis not present

## 2016-06-03 DIAGNOSIS — N183 Chronic kidney disease, stage 3 (moderate): Secondary | ICD-10-CM | POA: Diagnosis not present

## 2016-06-03 DIAGNOSIS — M4726 Other spondylosis with radiculopathy, lumbar region: Secondary | ICD-10-CM | POA: Diagnosis not present

## 2016-06-29 ENCOUNTER — Other Ambulatory Visit: Payer: Self-pay | Admitting: Internal Medicine

## 2016-08-10 ENCOUNTER — Encounter: Payer: Self-pay | Admitting: Internal Medicine

## 2016-08-10 ENCOUNTER — Ambulatory Visit (INDEPENDENT_AMBULATORY_CARE_PROVIDER_SITE_OTHER): Payer: Medicare Other | Admitting: Internal Medicine

## 2016-08-10 DIAGNOSIS — D485 Neoplasm of uncertain behavior of skin: Secondary | ICD-10-CM | POA: Diagnosis not present

## 2016-08-10 DIAGNOSIS — I10 Essential (primary) hypertension: Secondary | ICD-10-CM

## 2016-08-10 DIAGNOSIS — K5900 Constipation, unspecified: Secondary | ICD-10-CM | POA: Diagnosis not present

## 2016-08-10 DIAGNOSIS — D500 Iron deficiency anemia secondary to blood loss (chronic): Secondary | ICD-10-CM | POA: Diagnosis not present

## 2016-08-10 DIAGNOSIS — R739 Hyperglycemia, unspecified: Secondary | ICD-10-CM | POA: Diagnosis not present

## 2016-08-10 DIAGNOSIS — K5903 Drug induced constipation: Secondary | ICD-10-CM

## 2016-08-10 DIAGNOSIS — E440 Moderate protein-calorie malnutrition: Secondary | ICD-10-CM | POA: Diagnosis not present

## 2016-08-10 DIAGNOSIS — R269 Unspecified abnormalities of gait and mobility: Secondary | ICD-10-CM | POA: Diagnosis not present

## 2016-08-10 DIAGNOSIS — N183 Chronic kidney disease, stage 3 unspecified: Secondary | ICD-10-CM

## 2016-08-16 ENCOUNTER — Encounter: Payer: Self-pay | Admitting: Internal Medicine

## 2016-08-16 DIAGNOSIS — D485 Neoplasm of uncertain behavior of skin: Secondary | ICD-10-CM | POA: Insufficient documentation

## 2016-08-16 MED ORDER — GABAPENTIN 100 MG PO CAPS: 100.0000 mg | ORAL_CAPSULE | Freq: Three times a day (TID) | ORAL | 3 refills | Status: DC

## 2016-08-16 MED ORDER — TAMSULOSIN HCL 0.4 MG PO CAPS: 0.4000 mg | ORAL_CAPSULE | Freq: Every day | ORAL | 11 refills | Status: DC

## 2016-08-16 MED ORDER — COLCHICINE 0.6 MG PO TABS: 0.6000 mg | ORAL_TABLET | Freq: Every day | ORAL | 2 refills | Status: DC

## 2016-08-16 MED ORDER — METOPROLOL SUCCINATE ER 50 MG PO TB24: 50.0000 mg | ORAL_TABLET | Freq: Every day | ORAL | 3 refills | Status: DC

## 2016-08-16 MED ORDER — FERROUS SULFATE 325 (65 FE) MG PO TABS: 325.0000 mg | ORAL_TABLET | Freq: Every day | ORAL | 11 refills | Status: DC

## 2016-08-16 MED ORDER — HYDROCODONE-ACETAMINOPHEN 5-325 MG PO TABS: 1.0000 | ORAL_TABLET | Freq: Four times a day (QID) | ORAL | 0 refills | Status: DC | PRN

## 2016-08-16 MED ORDER — PANTOPRAZOLE SODIUM 40 MG PO TBEC: 40.0000 mg | DELAYED_RELEASE_TABLET | Freq: Every day | ORAL | 2 refills | Status: DC

## 2016-08-16 NOTE — Progress Notes (Signed)
Subjective:  Patient ID: Marvin Morrison, male    DOB: 1940/01/07  Age: 76 y.o. MRN: [id number]  CC: No chief complaint on file.   HPI Marvin Morrison presents for diarrhea, skin rash, LE weakness f/u  Outpatient Medications Prior to Visit  Medication Sig Dispense Refill  . acyclovir (ZOVIRAX) 400 MG tablet Take 1 tablet (400 mg total) by mouth 2 (two) times daily. 60 tablet 11  . colchicine 0.6 MG tablet Take 1 tablet (0.6 mg total) by mouth daily. 30 tablet 2  . ferrous sulfate 325 (65 FE) MG tablet TAKE 1 TABLET BY MOUTH EVERY DAY 30 tablet 5  . furosemide (LASIX) 20 MG tablet Take 1-2 tablets (20-40 mg total) by mouth daily as needed. 60 tablet 3  . gabapentin (NEURONTIN) 100 MG capsule TAKE 1 CAPSULE (100 MG TOTAL) BY MOUTH 3 (THREE) TIMES DAILY. 90 capsule 3  . HYDROcodone-acetaminophen (NORCO/VICODIN) 5-325 MG tablet Take 1 tablet by mouth every 6 (six) hours as needed (mild pain). 100 tablet 0  . lidocaine (LIDODERM) 5 % Place 1 patch onto the skin daily. Remove & Discard patch within 12 hours or as directed by MD 30 patch 0  . linaclotide (LINZESS) 290 MCG CAPS capsule Take 1 capsule (290 mcg total) by mouth daily before breakfast. As needed 30 capsule 1  . methocarbamol (ROBAXIN) 750 MG tablet Take 1 tablet (750 mg total) by mouth every 6 (six) hours as needed for muscle spasms. 60 tablet 0  . metoprolol succinate (TOPROL-XL) 50 MG 24 hr tablet Take 1 tablet (50 mg total) by mouth daily. Take with or immediately following a meal. 90 tablet 3  . Multiple Vitamin (MULTIVITAMIN WITH MINERALS) TABS tablet Take 1 tablet by mouth daily.    . nitrofurantoin, macrocrystal-monohydrate, (MACROBID) 100 MG capsule Take 1 capsule (100 mg total) by mouth 2 (two) times daily. 14 capsule 0  . ondansetron (ZOFRAN) 4 MG tablet Take 1 tablet (4 mg total) by mouth daily. 30 tablet 3  . pantoprazole (PROTONIX) 40 MG tablet Take 1 tablet (40 mg total) by mouth daily. 90 tablet 2  . tamsulosin (FLOMAX) 0.4  MG CAPS capsule Take 1 capsule (0.4 mg total) by mouth daily. 30 capsule 11  . vitamin C (VITAMIN C) 250 MG tablet Take 1 tablet (250 mg total) by mouth 3 (three) times daily with meals.    . gabapentin (NEURONTIN) 100 MG capsule Take 1 capsule (100 mg total) by mouth 3 (three) times daily. 90 capsule 11   No facility-administered medications prior to visit.     ROS Review of Systems  Constitutional: Positive for fatigue. Negative for appetite change and unexpected weight change.  HENT: Negative for congestion, nosebleeds, sneezing, sore throat and trouble swallowing.   Eyes: Negative for itching and visual disturbance.  Respiratory: Negative for cough.   Cardiovascular: Negative for chest pain, palpitations and leg swelling.  Gastrointestinal: Positive for diarrhea. Negative for abdominal distention, blood in stool and nausea.  Genitourinary: Negative for frequency and hematuria.  Musculoskeletal: Positive for gait problem. Negative for back pain, joint swelling and neck pain.  Skin: Positive for rash.  Neurological: Positive for weakness. Negative for dizziness, tremors and speech difficulty.  Psychiatric/Behavioral: Negative for agitation, dysphoric mood, sleep disturbance and suicidal ideas. The patient is not nervous/anxious.     Objective:  BP 118/62 (BP Location: Left Arm, Patient Position: Sitting, Cuff Size: Large)   Pulse 76   Temp 98.2 F (36.8 C) (Oral)   Ht  6' (1.829 m)   Wt 185 lb (83.9 kg)   SpO2 99%   BMI 25.09 kg/m   BP Readings from Last 3 Encounters:  08/10/16 118/62  05/27/16 128/82  03/22/16 100/62    Wt Readings from Last 3 Encounters:  08/10/16 185 lb (83.9 kg)  05/27/16 167 lb (75.8 kg)  03/22/16 157 lb (71.2 kg)    Physical Exam  Constitutional: He is oriented to person, place, and time. He appears well-developed. No distress.  NAD  HENT:  Mouth/Throat: Oropharynx is clear and moist.  Eyes: Pupils are equal, round, and reactive to light.  Conjunctivae are normal.  Neck: Normal range of motion. No JVD present. No thyromegaly present.  Cardiovascular: Normal rate, regular rhythm, normal heart sounds and intact distal pulses.  Exam reveals no gallop and no friction rub.   No murmur heard. Pulmonary/Chest: Effort normal and breath sounds normal. No respiratory distress. He has no wheezes. He has no rales. He exhibits no tenderness.  Abdominal: Soft. Bowel sounds are normal. He exhibits no distension and no mass. There is no tenderness. There is no rebound and no guarding.  Musculoskeletal: Normal range of motion. He exhibits no edema or tenderness.  Lymphadenopathy:    He has no cervical adenopathy.  Neurological: He is alert and oriented to person, place, and time. He has normal reflexes. No cranial nerve deficit. He exhibits normal muscle tone. He displays a negative Romberg sign. Coordination abnormal. Gait normal.  Skin: Skin is warm and dry. Rash noted.  Psychiatric: He has a normal mood and affect. His behavior is normal. Judgment and thought content normal.  In a w/c R cheek 11 mm lesion LE weak  Lab Results  Component Value Date   WBC 8.2 05/27/2016   HGB 12.5 (L) 05/27/2016   HCT 36.8 (L) 05/27/2016   PLT 230.0 05/27/2016   GLUCOSE 160 (H) 05/27/2016   CHOL 154 12/24/2013   TRIG 287.0 (H) 12/24/2013   HDL 18.10 (L) 12/24/2013   LDLDIRECT 92.1 12/24/2013   LDLCALC 102 (H) 06/13/2013   ALT 15 05/27/2016   AST 15 05/27/2016   NA 136 05/27/2016   K 3.5 05/27/2016   CL 100 05/27/2016   CREATININE 1.47 05/27/2016   BUN 28 (H) 05/27/2016   CO2 25 05/27/2016   TSH 1.35 05/27/2016   PSA 0.58 12/24/2013   HGBA1C 5.6 05/27/2016    Dg Abd 2 Views  Result Date: 03/22/2016 CLINICAL DATA:  Constipation and diarrhea. EXAM: ABDOMEN - 2 VIEW COMPARISON:  01/27/2016. FINDINGS: Soft tissue structures are unremarkable. Prominent amount of stool noted throughout the colon suggesting constipation . Gas pattern is nonspecific.  Degenerative changes of the thoracolumbar spine. No free air. IMPRESSION: Prominent amount of stool noted throughout the colon suggesting constipation . No bowel distention. No acute abnormality identified. Electronically Signed   By: Marcello Moores  Register   On: 03/22/2016 11:37    Assessment & Plan:   There are no diagnoses linked to this encounter. I am having Marvin Morrison maintain his acyclovir, lidocaine, methocarbamol, multivitamin with minerals, pantoprazole, ascorbic acid, colchicine, furosemide, metoprolol succinate, ferrous sulfate, tamsulosin, nitrofurantoin (macrocrystal-monohydrate), ondansetron, linaclotide, HYDROcodone-acetaminophen, and gabapentin.  No orders of the defined types were placed in this encounter.    Follow-up: No Follow-up on file.  Walker Kehr, MD

## 2016-08-16 NOTE — Assessment & Plan Note (Signed)
Monitor labs 

## 2016-08-16 NOTE — Assessment & Plan Note (Addendum)
R cheek bx adviced

## 2016-08-16 NOTE — Assessment & Plan Note (Signed)
Suggested OP PT/OT - he will let me know

## 2016-08-16 NOTE — Assessment & Plan Note (Signed)
Better  

## 2016-08-16 NOTE — Assessment & Plan Note (Signed)
Labs

## 2016-08-16 NOTE — Assessment & Plan Note (Signed)
labs

## 2016-08-16 NOTE — Assessment & Plan Note (Signed)
Linzess prn 

## 2016-08-16 NOTE — Assessment & Plan Note (Signed)
Furosemide, Toprol 

## 2016-08-18 ENCOUNTER — Telehealth: Payer: Self-pay

## 2016-08-18 NOTE — Telephone Encounter (Signed)
Mailed AVS to pt along with written rx for hydro and colchicine.

## 2016-08-25 ENCOUNTER — Other Ambulatory Visit: Payer: Self-pay | Admitting: Internal Medicine

## 2016-08-25 NOTE — Addendum Note (Signed)
Addendum  created 08/25/16 [id number] by Roberts Gaudy, MD   Sign clinical note

## 2016-09-28 ENCOUNTER — Other Ambulatory Visit: Payer: Self-pay | Admitting: Internal Medicine

## 2016-11-08 ENCOUNTER — Other Ambulatory Visit: Payer: Self-pay | Admitting: Internal Medicine

## 2016-12-14 ENCOUNTER — Ambulatory Visit: Payer: Medicare Other | Admitting: Internal Medicine

## 2017-01-30 ENCOUNTER — Other Ambulatory Visit: Payer: Self-pay | Admitting: Internal Medicine

## 2017-02-27 ENCOUNTER — Other Ambulatory Visit: Payer: Self-pay | Admitting: Internal Medicine

## 2017-02-27 NOTE — Telephone Encounter (Signed)
Routing to dr plotnikov, please advise, thanks 

## 2017-02-28 NOTE — Telephone Encounter (Signed)
Route to CMA °

## 2017-02-28 NOTE — Telephone Encounter (Signed)
Reviewed chart pt is up-to-date sent refills to [facility].../lmb  

## 2017-02-28 NOTE — Telephone Encounter (Signed)
°  Relation to pt: self  Call back number: [phone] Pharmacy: [id number] - [place], [place]. [address] [phone] ([phone] (Fax)     Reason for call:  Patient checking on the status of acyclovir (ZOVIRAX) 400 MG tablet request, informed patient please allow 48 to 72 hour turn around time. Patient wanted me to note pharmacy sent in 2 request and his 77 year old wife will be going to the pharmacy tomorrow to pick up all prescriptions previously sent, please advise

## 2017-03-20 ENCOUNTER — Other Ambulatory Visit (INDEPENDENT_AMBULATORY_CARE_PROVIDER_SITE_OTHER): Payer: Medicare Other

## 2017-03-20 ENCOUNTER — Ambulatory Visit (INDEPENDENT_AMBULATORY_CARE_PROVIDER_SITE_OTHER): Payer: Medicare Other | Admitting: Internal Medicine

## 2017-03-20 ENCOUNTER — Encounter: Payer: Self-pay | Admitting: Internal Medicine

## 2017-03-20 DIAGNOSIS — Z9119 Patient's noncompliance with other medical treatment and regimen: Secondary | ICD-10-CM | POA: Diagnosis not present

## 2017-03-20 DIAGNOSIS — K5900 Constipation, unspecified: Secondary | ICD-10-CM

## 2017-03-20 DIAGNOSIS — R739 Hyperglycemia, unspecified: Secondary | ICD-10-CM | POA: Diagnosis not present

## 2017-03-20 DIAGNOSIS — Z91199 Patient's noncompliance with other medical treatment and regimen due to unspecified reason: Secondary | ICD-10-CM

## 2017-03-20 DIAGNOSIS — I10 Essential (primary) hypertension: Secondary | ICD-10-CM

## 2017-03-20 DIAGNOSIS — D500 Iron deficiency anemia secondary to blood loss (chronic): Secondary | ICD-10-CM | POA: Diagnosis not present

## 2017-03-20 LAB — BASIC METABOLIC PANEL
BUN: 26 mg/dL — AB (ref 6–23)
CHLORIDE: 100 meq/L (ref 96–112)
CO2: 27 mEq/L (ref 19–32)
CREATININE: 1.52 mg/dL — AB (ref 0.40–1.50)
Calcium: 9.4 mg/dL (ref 8.4–10.5)
GFR: 47.31 mL/min — ABNORMAL LOW (ref >60.00)
GLUCOSE: 128 mg/dL — AB (ref 70–99)
Potassium: 4.1 mEq/L (ref 3.5–5.1)
Sodium: 138 mEq/L (ref 135–145)

## 2017-03-20 LAB — HEMOGLOBIN A1C: Hgb A1c MFr Bld: 5.9 % (ref 4.6–6.5)

## 2017-03-20 LAB — CBC WITH DIFFERENTIAL/PLATELET
BASOS PCT: 0.4 % (ref 0.0–3.0)
Basophils Absolute: 0 10*3/uL (ref 0.0–0.1)
EOS ABS: 0.2 10*3/uL (ref 0.0–0.7)
Eosinophils Relative: 2.9 % (ref 0.0–5.0)
HCT: 38.2 % — ABNORMAL LOW (ref 39.0–52.0)
HEMOGLOBIN: 13.4 g/dL (ref 13.0–17.0)
LYMPHS ABS: 1.6 10*3/uL (ref 0.7–4.0)
Lymphocytes Relative: 19.1 % (ref 12.0–46.0)
MCHC: 35.1 g/dL (ref 30.0–36.0)
MCV: 98.1 fl (ref 78.0–100.0)
MONO ABS: 0.8 10*3/uL (ref 0.1–1.0)
Monocytes Relative: 9.1 % (ref 3.0–12.0)
NEUTROS PCT: 68.5 % (ref 43.0–77.0)
Neutro Abs: 5.7 10*3/uL (ref 1.4–7.7)
PLATELETS: 212 10*3/uL (ref 150.0–400.0)
RBC: 3.9 Mil/uL — ABNORMAL LOW (ref 4.22–5.81)
RDW: 12.8 % (ref 11.5–15.5)
WBC: 8.3 10*3/uL (ref 4.0–10.5)

## 2017-03-20 LAB — HEPATIC FUNCTION PANEL
ALBUMIN: 4.2 g/dL (ref 3.5–5.2)
ALK PHOS: 72 U/L (ref 39–117)
ALT: 25 U/L (ref 0–53)
AST: 16 U/L (ref 0–37)
BILIRUBIN DIRECT: 0.1 mg/dL (ref 0.0–0.3)
TOTAL PROTEIN: 7.1 g/dL (ref 6.0–8.3)
Total Bilirubin: 0.4 mg/dL (ref 0.2–1.2)

## 2017-03-20 LAB — TSH: TSH: 1.36 u[IU]/mL (ref 0.35–4.50)

## 2017-03-20 LAB — IBC PANEL
IRON: 92 ug/dL (ref 42–165)
Saturation Ratios: 31.1 % (ref 20.0–50.0)
Transferrin: 211 mg/dL — ABNORMAL LOW (ref 212.0–360.0)

## 2017-03-20 LAB — IRON: Iron: 92 ug/dL (ref 42–165)

## 2017-03-20 LAB — FERRITIN: FERRITIN: 96 ng/mL (ref 22.0–322.0)

## 2017-03-20 MED ORDER — HYDROCODONE-ACETAMINOPHEN 5-325 MG PO TABS: 1.0000 | ORAL_TABLET | Freq: Four times a day (QID) | ORAL | 0 refills | Status: DC | PRN

## 2017-03-20 MED ORDER — COLCHICINE 0.6 MG PO TABS: 0.6000 mg | ORAL_TABLET | Freq: Two times a day (BID) | ORAL | 2 refills | Status: DC | PRN

## 2017-03-20 MED ORDER — ONDANSETRON HCL 4 MG PO TABS: 4.0000 mg | ORAL_TABLET | Freq: Every day | ORAL | 5 refills | Status: DC

## 2017-03-20 NOTE — Progress Notes (Signed)
Subjective:  Patient ID: Marvin Morrison, male    DOB: [birth year]  Age: 77 y.o. MRN: [id number]  CC: No chief complaint on file.   HPI Marvin Morrison presents for gout, chronic diarrhea better, BPH, OA, nausea  Outpatient Medications Prior to Visit  Medication Sig Dispense Refill  . acyclovir (ZOVIRAX) 400 MG tablet TAKE 1 TABLET BY MOUTH TWICE DAILY 60 tablet 5  . colchicine 0.6 MG tablet Take 1 tablet (0.6 mg total) by mouth daily. 30 tablet 2  . ferrous sulfate 325 (65 FE) MG tablet Take 1 tablet (325 mg total) by mouth daily. 30 tablet 11  . furosemide (LASIX) 20 MG tablet Take 1-2 tablets (20-40 mg total) by mouth daily as needed. 60 tablet 3  . gabapentin (NEURONTIN) 100 MG capsule TAKE 1 CAPSULE BY MOUTH THREE TIMES A DAY 90 capsule 11  . HYDROcodone-acetaminophen (NORCO/VICODIN) 5-325 MG tablet Take 1 tablet by mouth every 6 (six) hours as needed (mild pain). 100 tablet 0  . lidocaine (LIDODERM) 5 % Place 1 patch onto the skin daily. Remove & Discard patch within 12 hours or as directed by MD 30 patch 0  . linaclotide (LINZESS) 290 MCG CAPS capsule Take 1 capsule (290 mcg total) by mouth daily before breakfast. As needed 30 capsule 1  . methocarbamol (ROBAXIN) 750 MG tablet Take 1 tablet (750 mg total) by mouth every 6 (six) hours as needed for muscle spasms. 60 tablet 0  . metoprolol succinate (TOPROL-XL) 50 MG 24 hr tablet Take 1 tablet (50 mg total) by mouth daily. Take with or immediately following a meal. 90 tablet 3  . Multiple Vitamin (MULTIVITAMIN WITH MINERALS) TABS tablet Take 1 tablet by mouth daily.    . nitrofurantoin, macrocrystal-monohydrate, (MACROBID) 100 MG capsule Take 1 capsule (100 mg total) by mouth 2 (two) times daily. 14 capsule 0  . ondansetron (ZOFRAN) 4 MG tablet TAKE 1 TABLET (4 MG TOTAL) BY MOUTH DAILY. 30 tablet 3  . ondansetron (ZOFRAN) 4 MG tablet TAKE 1 TABLET BY MOUTH EVERY DAY 30 tablet 0  . ondansetron (ZOFRAN) 4 MG tablet TAKE 1 TABLET BY MOUTH  EVERY DAY 30 tablet 1  . pantoprazole (PROTONIX) 40 MG tablet Take 1 tablet (40 mg total) by mouth daily. 90 tablet 2  . tamsulosin (FLOMAX) 0.4 MG CAPS capsule Take 1 capsule (0.4 mg total) by mouth daily. 30 capsule 11  . triamcinolone ointment (KENALOG) 0.1 % APPLY TO AFFECTED AREA TWICE A DAY 80 g 1  . vitamin C (VITAMIN C) 250 MG tablet Take 1 tablet (250 mg total) by mouth 3 (three) times daily with meals.     No facility-administered medications prior to visit.     ROS Review of Systems  Constitutional: Positive for fatigue. Negative for appetite change and unexpected weight change.  HENT: Negative for congestion, nosebleeds, sneezing, sore throat and trouble swallowing.   Eyes: Negative for itching and visual disturbance.  Respiratory: Negative for cough.   Cardiovascular: Negative for chest pain, palpitations and leg swelling.  Gastrointestinal: Positive for diarrhea. Negative for abdominal distention, blood in stool and nausea.  Genitourinary: Negative for frequency and hematuria.  Musculoskeletal: Positive for arthralgias, back pain and gait problem. Negative for joint swelling and neck pain.  Skin: Negative for rash.  Neurological: Positive for weakness. Negative for dizziness, tremors and speech difficulty.  Psychiatric/Behavioral: Negative for agitation, dysphoric mood, sleep disturbance and suicidal ideas. The patient is not nervous/anxious.     Objective:  BP  128/82 (BP Location: Left Arm, Patient Position: Sitting, Cuff Size: Large)   Pulse 92   Temp 98.4 F (36.9 C) (Oral)   Ht 6' (1.829 m)   Wt 185 lb (83.9 kg)   SpO2 99%   BMI 25.09 kg/m   BP Readings from Last 3 Encounters:  03/20/17 128/82  08/10/16 118/62  05/27/16 128/82    Wt Readings from Last 3 Encounters:  03/20/17 185 lb (83.9 kg)  08/10/16 185 lb (83.9 kg)  05/27/16 167 lb (75.8 kg)    Physical Exam  Constitutional: He is oriented to person, place, and time. He appears well-developed. No  distress.  NAD  HENT:  Mouth/Throat: Oropharynx is clear and moist.  Eyes: Conjunctivae are normal. Pupils are equal, round, and reactive to light.  Neck: Normal range of motion. No JVD present. No thyromegaly present.  Cardiovascular: Normal rate, regular rhythm, normal heart sounds and intact distal pulses. Exam reveals no gallop and no friction rub.  No murmur heard. Pulmonary/Chest: Effort normal and breath sounds normal. No respiratory distress. He has no wheezes. He has no rales. He exhibits no tenderness.  Abdominal: Soft. Bowel sounds are normal. He exhibits no distension and no mass. There is no tenderness. There is no rebound and no guarding.  Musculoskeletal: Normal range of motion. He exhibits tenderness. He exhibits no edema.  Lymphadenopathy:    He has no cervical adenopathy.  Neurological: He is alert and oriented to person, place, and time. He displays abnormal reflex. No cranial nerve deficit. He exhibits normal muscle tone. He displays a negative Romberg sign. Coordination abnormal. Gait normal.  Skin: Skin is warm and dry. No rash noted.  Psychiatric: He has a normal mood and affect. His behavior is normal. Judgment and thought content normal.  in a w/c Face w/erythema  Lab Results  Component Value Date   WBC 8.2 05/27/2016   HGB 12.5 (L) 05/27/2016   HCT 36.8 (L) 05/27/2016   PLT 230.0 05/27/2016   GLUCOSE 160 (H) 05/27/2016   CHOL 154 12/24/2013   TRIG 287.0 (H) 12/24/2013   HDL 18.10 (L) 12/24/2013   LDLDIRECT 92.1 12/24/2013   LDLCALC 102 (H) 06/13/2013   ALT 15 05/27/2016   AST 15 05/27/2016   NA 136 05/27/2016   K 3.5 05/27/2016   CL 100 05/27/2016   CREATININE 1.47 05/27/2016   BUN 28 (H) 05/27/2016   CO2 25 05/27/2016   TSH 1.35 05/27/2016   PSA 0.58 12/24/2013   HGBA1C 5.6 05/27/2016    Dg Abd 2 Views  Result Date: 03/22/2016 CLINICAL DATA:  Constipation and diarrhea. EXAM: ABDOMEN - 2 VIEW COMPARISON:  01/27/2016. FINDINGS: Soft tissue  structures are unremarkable. Prominent amount of stool noted throughout the colon suggesting constipation . Gas pattern is nonspecific. Degenerative changes of the thoracolumbar spine. No free air. IMPRESSION: Prominent amount of stool noted throughout the colon suggesting constipation . No bowel distention. No acute abnormality identified. Electronically Signed   By: Marcello Moores  Register   On: 03/22/2016 11:37    Assessment & Plan:   There are no diagnoses linked to this encounter. I am having Gretchen Short maintain his lidocaine, methocarbamol, multivitamin with minerals, ascorbic acid, furosemide, nitrofurantoin (macrocrystal-monohydrate), linaclotide, colchicine, ferrous sulfate, HYDROcodone-acetaminophen, metoprolol succinate, pantoprazole, tamsulosin, ondansetron, ondansetron, triamcinolone ointment, ondansetron, gabapentin, and acyclovir.  No orders of the defined types were placed in this encounter.    Follow-up: No Follow-up on file.  Walker Kehr, MD

## 2017-03-25 DIAGNOSIS — Z9119 Patient's noncompliance with other medical treatment and regimen: Secondary | ICD-10-CM | POA: Insufficient documentation

## 2017-03-25 DIAGNOSIS — Z91199 Patient's noncompliance with other medical treatment and regimen due to unspecified reason: Secondary | ICD-10-CM | POA: Insufficient documentation

## 2017-03-25 NOTE — Assessment & Plan Note (Signed)
Discussed.

## 2017-04-17 ENCOUNTER — Other Ambulatory Visit: Payer: Self-pay | Admitting: Internal Medicine

## 2017-04-18 NOTE — Telephone Encounter (Signed)
Pt. Called and said he is out and would like Dr. Alain Marion to [facility]

## 2017-04-24 ENCOUNTER — Ambulatory Visit (INDEPENDENT_AMBULATORY_CARE_PROVIDER_SITE_OTHER): Payer: Medicare Other | Admitting: Family

## 2017-04-24 ENCOUNTER — Ambulatory Visit (INDEPENDENT_AMBULATORY_CARE_PROVIDER_SITE_OTHER)
Admission: RE | Admit: 2017-04-24 | Discharge: 2017-04-24 | Disposition: A | Discharge status: Home / Self Care | Payer: Medicare Other | Source: Ambulatory Visit | Attending: Family | Admitting: Family

## 2017-04-24 ENCOUNTER — Encounter: Payer: Self-pay | Admitting: Family

## 2017-04-24 VITALS — BP 130/72 | HR 111 | Temp 98.3°F | Ht 72.0 in | Wt 178.0 lb

## 2017-04-24 DIAGNOSIS — R059 Cough, unspecified: Secondary | ICD-10-CM

## 2017-04-24 DIAGNOSIS — R05 Cough: Secondary | ICD-10-CM | POA: Diagnosis not present

## 2017-04-24 MED ORDER — PREDNISONE 20 MG PO TABS: 20.0000 mg | ORAL_TABLET | Freq: Every day | ORAL | 0 refills | Status: DC

## 2017-04-24 MED ORDER — DOXYCYCLINE HYCLATE 100 MG PO TABS: 100.0000 mg | ORAL_TABLET | Freq: Two times a day (BID) | ORAL | 0 refills | Status: DC

## 2017-04-24 NOTE — Progress Notes (Signed)
Marvin Morrison is a 77 y.o. male with the following history as recorded in Epic[employer]:  Patient Active Problem List   Diagnosis Date Noted  . Noncompliance 03/25/2017  . Neoplasm of uncertain behavior of skin 08/16/2016  . Loose stools   . Hyperkalemia   . Acute lower UTI   . AKI (acute kidney injury) ([place])   . Sleep disturbance   . Labile blood pressure   . Neuropathic pain   . Dysuria   . Hypoalbuminemia due to protein-calorie malnutrition ([place])   . Lymphocytosis   . Hyperglycemia   . Radiculopathy 01/13/2016  . Surgery, elective   . Post-operative pain   . Acute blood loss anemia   . Stage 3 chronic kidney disease ([place])   . Urinary retention   . Hypokalemia   . Constipation due to pain medication   . Pressure injury of skin 01/12/2016  . Malnutrition of moderate degree 01/12/2016  . Lumbar stenosis 01/11/2016  . Gait disorder 11/18/2015  . Spinal stenosis of lumbar region 10/28/2015  . Nausea with vomiting 04/19/2014  . Loss of weight 05/18/2012  . Well adult exam 05/13/2011  . 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 Anemia, iron deficiency 04/06/2010  . DIARRHEA, CHRONIC 03/27/2009  . 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 Hyperlipidemia 02/01/2008  . ABNORMAL GLUCOSE NEC 02/01/2008  . Weakness 11/19/2007  . SHINGLES 11/17/2007  . Essential hypertension 11/09/2006    Current Outpatient Medications  Medication Sig Dispense Refill  . acyclovir (ZOVIRAX) 400 MG tablet TAKE 1 TABLET BY MOUTH TWICE DAILY 60 tablet 5  . colchicine 0.6 MG tablet Take 1 tablet (0.6 mg total) by mouth 2 (two) times daily as needed. 30 tablet 2  . doxycycline (VIBRA-TABS) 100 MG tablet Take 1 tablet (100 mg total) by mouth 2 (two) times daily. 20 tablet 0  . furosemide (LASIX) 20 MG tablet Take 1-2 tablets (20-40 mg total) by mouth daily as needed. 60 tablet 3  . gabapentin (NEURONTIN) 100 MG capsule TAKE 1 CAPSULE BY MOUTH THREE TIMES A DAY 90 capsule 11  . HYDROcodone-acetaminophen  (NORCO/VICODIN) 5-325 MG tablet Take 1 tablet by mouth every 6 (six) hours as needed (mild pain). 20 tablet 0  . lidocaine (LIDODERM) 5 % Place 1 patch onto the skin daily. Remove & Discard patch within 12 hours or as directed by MD 30 patch 0  . metoprolol succinate (TOPROL-XL) 50 MG 24 hr tablet Take 1 tablet (50 mg total) by mouth daily. Take with or immediately following a meal. 90 tablet 3  . Multiple Vitamin (MULTIVITAMIN WITH MINERALS) TABS tablet Take 1 tablet by mouth daily.    . ondansetron (ZOFRAN) 4 MG tablet Take 1 tablet (4 mg total) by mouth daily. 30 tablet 5  . pantoprazole (PROTONIX) 40 MG tablet Take 1 tablet (40 mg total) by mouth daily. 90 tablet 2  . predniSONE (DELTASONE) 20 MG tablet Take 1 tablet (20 mg total) by mouth daily with breakfast. 5 tablet 0  . tamsulosin (FLOMAX) 0.4 MG CAPS capsule Take 1 capsule (0.4 mg total) by mouth daily. 30 capsule 11  . triamcinolone ointment (KENALOG) 0.1 % APPLY TO AFFECTED AREA TWICE A DAY 80 g 1  . vitamin C (VITAMIN C) 250 MG tablet Take 1 tablet (250 mg total) by mouth 3 (three) times daily with meals.     No current facility-administered medications for this visit.     Allergies: Patient has no known allergies.  Past Medical History:  Diagnosis  Date  . Arthritis   . Chronic kidney disease   . Colitis   . Diarrhea   . Eczema   . Elevated glucose   . HTN (hypertension)   . Hyperlipidemia   . Pneumonia   . Shingles 2009    Past Surgical History:  Procedure Laterality Date  . LUMBAR LAMINECTOMY/DECOMPRESSION MICRODISCECTOMY N/A 01/11/2016   Procedure: Lumbar two- three, Lumbar three- four, Lumbar four- five, Lumbar five-Sacral one Laminectomy;  Surgeon: Kristeen Miss, MD;  Location: [facility];  Service: Neurosurgery;  Laterality: N/A;  L2-3 L3-4 L4-5 L5-S1 Laminectomy  . TONSILLECTOMY     Age 1    Family History  Problem Relation Age of Onset  . Hypertension Mother   . Hyperlipidemia Father   . Hypertension Father      Social History   Tobacco Use  . Smoking status: Never Smoker  . Smokeless tobacco: Never Used  Substance Use Topics  . Alcohol use: No    Subjective:  Started Saturday with cough/ congestion; + wheezing/ shortness of breath; + fever; no chest pain; using OTC Mucinex with little benefit; not prone to bronchitis or pneumonia;   Objective:  Vitals:   04/24/17 1449  BP: 130/72  Pulse: (!) 111  Temp: 98.3 F (36.8 C)  TempSrc: Oral  SpO2: 96%  Weight: 178 lb (80.7 kg)  Height: 6' (1.829 m)    General: Well developed, well nourished, in no acute distress  Skin : Warm and dry.  Head: Normocephalic and atraumatic  Eyes: Sclera and conjunctiva clear; pupils round and reactive to light; extraocular movements intact  Ears: External normal; canals clear; tympanic membranes normal  Oropharynx: Pink, supple. No suspicious lesions  Neck: Supple without thyromegaly, adenopathy  Lungs: Respirations unlabored; coarse breath sounds noted in all 4 lobes- mild improvement after nebulizer treatment in office; CVS exam: normal rate and regular rhythm.  Neurologic: Alert and oriented; speech intact; face symmetrical; moves all extremities well; CNII-XII intact without focal deficit  Assessment:  1. Cough     Plan:  Concern for pneumonia; albuterol nebulizer treatment given in office; update CXR today; Rx for Doxycycline 100 mg bid x 10 days; Rx for Prednisone 20 mg qd x 5 days; increase fluids, rest and follow-up to be determined; Strict ER precautions discussed.    No follow-ups on file.  Orders Placed This Encounter  Procedures  . DG Chest 2 View    Standing Status:   Future    Number of Occurrences:   1    Standing Expiration Date:   06/25/2018    Order Specific Question:   Reason for Exam (SYMPTOM  OR DIAGNOSIS REQUIRED)    Answer:   cough    Order Specific Question:   Preferred imaging location?    Answer:   [facility]    Order Specific Question:   Radiology Contrast Protocol -  do NOT remove file path    Answer:   [email or web address]    Requested Prescriptions   Signed Prescriptions Disp Refills  . doxycycline (VIBRA-TABS) 100 MG tablet 20 tablet 0    Sig: Take 1 tablet (100 mg total) by mouth 2 (two) times daily.  . predniSONE (DELTASONE) 20 MG tablet 5 tablet 0    Sig: Take 1 tablet (20 mg total) by mouth daily with breakfast.

## 2017-04-26 NOTE — Progress Notes (Signed)
(  FYI) Spoke with patient today. His coughing has let up and fever is not as bad as it was. I will call him back on Friday to check in on him. He was very appreciative of the call and wanted me to tell you "thank  you" for everything.

## 2017-06-13 ENCOUNTER — Other Ambulatory Visit: Payer: Self-pay | Admitting: Internal Medicine

## 2017-08-23 ENCOUNTER — Other Ambulatory Visit: Payer: Self-pay | Admitting: Internal Medicine

## 2017-08-24 ENCOUNTER — Other Ambulatory Visit: Payer: Self-pay | Admitting: Internal Medicine

## 2017-08-25 MED ORDER — METOPROLOL SUCCINATE ER 50 MG PO TB24: ORAL_TABLET | ORAL | 3 refills | Status: DC

## 2017-08-25 MED ORDER — TAMSULOSIN HCL 0.4 MG PO CAPS: 0.4000 mg | ORAL_CAPSULE | Freq: Every day | ORAL | 11 refills | Status: DC

## 2017-08-25 NOTE — Addendum Note (Signed)
Addended by: Karren Cobble on: 08/25/2017 08:57 AM   Modules accepted: Orders

## 2017-09-06 ENCOUNTER — Ambulatory Visit (INDEPENDENT_AMBULATORY_CARE_PROVIDER_SITE_OTHER): Payer: Medicare Other | Admitting: Internal Medicine

## 2017-09-06 ENCOUNTER — Other Ambulatory Visit (INDEPENDENT_AMBULATORY_CARE_PROVIDER_SITE_OTHER): Payer: Medicare Other

## 2017-09-06 ENCOUNTER — Encounter: Payer: Self-pay | Admitting: Internal Medicine

## 2017-09-06 VITALS — BP 122/80 | HR 87 | Ht 72.0 in | Wt 183.0 lb

## 2017-09-06 DIAGNOSIS — N183 Chronic kidney disease, stage 3 unspecified: Secondary | ICD-10-CM

## 2017-09-06 DIAGNOSIS — M5415 Radiculopathy, thoracolumbar region: Secondary | ICD-10-CM

## 2017-09-06 DIAGNOSIS — R739 Hyperglycemia, unspecified: Secondary | ICD-10-CM | POA: Diagnosis not present

## 2017-09-06 DIAGNOSIS — R531 Weakness: Secondary | ICD-10-CM

## 2017-09-06 DIAGNOSIS — I10 Essential (primary) hypertension: Secondary | ICD-10-CM | POA: Diagnosis not present

## 2017-09-06 DIAGNOSIS — N320 Bladder-neck obstruction: Secondary | ICD-10-CM

## 2017-09-06 DIAGNOSIS — R634 Abnormal weight loss: Secondary | ICD-10-CM | POA: Diagnosis not present

## 2017-09-06 DIAGNOSIS — E782 Mixed hyperlipidemia: Secondary | ICD-10-CM | POA: Diagnosis not present

## 2017-09-06 DIAGNOSIS — L298 Other pruritus: Secondary | ICD-10-CM

## 2017-09-06 DIAGNOSIS — E876 Hypokalemia: Secondary | ICD-10-CM | POA: Diagnosis not present

## 2017-09-06 LAB — CBC WITH DIFFERENTIAL/PLATELET
BASOS ABS: 0 10*3/uL (ref 0.0–0.1)
BASOS PCT: 0.5 % (ref 0.0–3.0)
EOS ABS: 0.2 10*3/uL (ref 0.0–0.7)
Eosinophils Relative: 2.6 % (ref 0.0–5.0)
HEMATOCRIT: 40.1 % (ref 39.0–52.0)
HEMOGLOBIN: 13.7 g/dL (ref 13.0–17.0)
Lymphocytes Relative: 18.6 % (ref 12.0–46.0)
Lymphs Abs: 1.8 10*3/uL (ref 0.7–4.0)
MCHC: 34.1 g/dL (ref 30.0–36.0)
MCV: 98.6 fl (ref 78.0–100.0)
MONO ABS: 0.8 10*3/uL (ref 0.1–1.0)
Monocytes Relative: 8.7 % (ref 3.0–12.0)
NEUTROS ABS: 6.6 10*3/uL (ref 1.4–7.7)
Neutrophils Relative %: 69.6 % (ref 43.0–77.0)
Platelets: 196 10*3/uL (ref 150.0–400.0)
RBC: 4.07 Mil/uL — ABNORMAL LOW (ref 4.22–5.81)
RDW: 12.9 % (ref 11.5–15.5)
WBC: 9.4 10*3/uL (ref 4.0–10.5)

## 2017-09-06 LAB — HEPATIC FUNCTION PANEL
ALT: 19 U/L (ref 0–53)
AST: 15 U/L (ref 0–37)
Albumin: 4.2 g/dL (ref 3.5–5.2)
Alkaline Phosphatase: 64 U/L (ref 39–117)
BILIRUBIN DIRECT: 0.1 mg/dL (ref 0.0–0.3)
BILIRUBIN TOTAL: 0.4 mg/dL (ref 0.2–1.2)
TOTAL PROTEIN: 6.5 g/dL (ref 6.0–8.3)

## 2017-09-06 LAB — LIPID PANEL
Cholesterol: 269 mg/dL — ABNORMAL HIGH (ref 0–200)
HDL: 30.7 mg/dL — ABNORMAL LOW (ref >39.00)
Total CHOL/HDL Ratio: 9

## 2017-09-06 LAB — BASIC METABOLIC PANEL
BUN: 25 mg/dL — AB (ref 6–23)
CHLORIDE: 102 meq/L (ref 96–112)
CO2: 29 mEq/L (ref 19–32)
Calcium: 9.2 mg/dL (ref 8.4–10.5)
Creatinine, Ser: 1.53 mg/dL — ABNORMAL HIGH (ref 0.40–1.50)
GFR: 46.9 mL/min — ABNORMAL LOW (ref >60.00)
GLUCOSE: 133 mg/dL — AB (ref 70–99)
POTASSIUM: 3.9 meq/L (ref 3.5–5.1)
Sodium: 140 mEq/L (ref 135–145)

## 2017-09-06 LAB — TSH: TSH: 1.15 u[IU]/mL (ref 0.35–4.50)

## 2017-09-06 LAB — LDL CHOLESTEROL, DIRECT: Direct LDL: 153 mg/dL

## 2017-09-06 LAB — PSA: PSA: 0.89 ng/mL (ref 0.10–4.00)

## 2017-09-06 NOTE — Assessment & Plan Note (Signed)
Furosemide, Toprol 

## 2017-09-06 NOTE — Assessment & Plan Note (Signed)
BMET 

## 2017-09-06 NOTE — Assessment & Plan Note (Signed)
Labs

## 2017-09-06 NOTE — Progress Notes (Signed)
Subjective:  Patient ID: Marvin Morrison, male    DOB: 27-Jan-1940  Age: 77 y.o. MRN: [id number]  CC: No chief complaint on file.   HPI DEVERE BREM presents for `leg weakness, diarrhea, rash f/u Refusing shots    Outpatient Medications Prior to Visit  Medication Sig Dispense Refill  . acyclovir (ZOVIRAX) 400 MG tablet TAKE 1 TABLET BY MOUTH TWICE A DAY 60 tablet 5  . colchicine 0.6 MG tablet Take 1 tablet (0.6 mg total) by mouth 2 (two) times daily as needed. 30 tablet 2  . doxycycline (VIBRA-TABS) 100 MG tablet Take 1 tablet (100 mg total) by mouth 2 (two) times daily. 20 tablet 0  . furosemide (LASIX) 20 MG tablet Take 1-2 tablets (20-40 mg total) by mouth daily as needed. 60 tablet 3  . gabapentin (NEURONTIN) 100 MG capsule TAKE 1 CAPSULE BY MOUTH THREE TIMES A DAY 90 capsule 11  . HYDROcodone-acetaminophen (NORCO/VICODIN) 5-325 MG tablet Take 1 tablet by mouth every 6 (six) hours as needed (mild pain). 20 tablet 0  . lidocaine (LIDODERM) 5 % Place 1 patch onto the skin daily. Remove & Discard patch within 12 hours or as directed by MD 30 patch 0  . metoprolol succinate (TOPROL-XL) 50 MG 24 hr tablet TAKE 1 TABLET EVERY DAY. TAKE IMMEDIATELY FOLLOWING A MEAL 90 tablet 3  . Multiple Vitamin (MULTIVITAMIN WITH MINERALS) TABS tablet Take 1 tablet by mouth daily.    . ondansetron (ZOFRAN) 4 MG tablet Take 1 tablet (4 mg total) by mouth daily. 30 tablet 5  . pantoprazole (PROTONIX) 40 MG tablet Take 1 tablet (40 mg total) by mouth daily. 90 tablet 2  . predniSONE (DELTASONE) 20 MG tablet Take 1 tablet (20 mg total) by mouth daily with breakfast. 5 tablet 0  . tamsulosin (FLOMAX) 0.4 MG CAPS capsule Take 1 capsule (0.4 mg total) by mouth daily. 30 capsule 11  . triamcinolone ointment (KENALOG) 0.1 % APPLY TO AFFECTED AREA TWICE A DAY 80 g 1  . vitamin C (VITAMIN C) 250 MG tablet Take 1 tablet (250 mg total) by mouth 3 (three) times daily with meals.     No facility-administered  medications prior to visit.     ROS: Review of Systems  Constitutional: Positive for fatigue. Negative for appetite change and unexpected weight change.  HENT: Negative for congestion, nosebleeds, sneezing, sore throat and trouble swallowing.   Eyes: Negative for itching and visual disturbance.  Respiratory: Negative for cough.   Cardiovascular: Negative for chest pain, palpitations and leg swelling.  Gastrointestinal: Positive for constipation. Negative for abdominal distention, blood in stool, diarrhea and nausea.  Genitourinary: Negative for frequency and hematuria.  Musculoskeletal: Positive for back pain and gait problem. Negative for joint swelling and neck pain.  Skin: Negative for rash.  Neurological: Negative for dizziness, tremors, speech difficulty and weakness.  Psychiatric/Behavioral: Negative for agitation, dysphoric mood, sleep disturbance and suicidal ideas. The patient is not nervous/anxious.     Objective:  BP 122/80 (BP Location: Left Arm, Patient Position: Sitting, Cuff Size: Normal)   Pulse 87   Ht 6' (1.829 m)   Wt 183 lb (83 kg)   SpO2 95%   BMI 24.82 kg/m   BP Readings from Last 3 Encounters:  09/06/17 122/80  04/24/17 130/72  03/20/17 128/82    Wt Readings from Last 3 Encounters:  09/06/17 183 lb (83 kg)  04/24/17 178 lb (80.7 kg)  03/20/17 185 lb (83.9 kg)    Physical Exam  Constitutional: He is oriented to person, place, and time. He appears well-developed. No distress.  NAD  HENT:  Mouth/Throat: Oropharynx is clear and moist.  Eyes: Pupils are equal, round, and reactive to light. Conjunctivae are normal.  Neck: Normal range of motion. No JVD present. No thyromegaly present.  Cardiovascular: Normal rate, regular rhythm, normal heart sounds and intact distal pulses. Exam reveals no gallop and no friction rub.  No murmur heard. Pulmonary/Chest: Effort normal and breath sounds normal. No respiratory distress. He has no wheezes. He has no rales.  He exhibits no tenderness.  Abdominal: Soft. Bowel sounds are normal. He exhibits no distension and no mass. There is no tenderness. There is no rebound and no guarding.  Musculoskeletal: Normal range of motion. He exhibits tenderness. He exhibits no edema.  Lymphadenopathy:    He has no cervical adenopathy.  Neurological: He is alert and oriented to person, place, and time. He has normal reflexes. No cranial nerve deficit. He exhibits normal muscle tone. He displays a negative Romberg sign. Coordination abnormal. Gait normal.  Skin: Skin is warm and dry. No rash noted.  Psychiatric: He has a normal mood and affect. His behavior is normal. Judgment and thought content normal.  rash on face In a w/c  Lab Results  Component Value Date   WBC 8.3 03/20/2017   HGB 13.4 03/20/2017   HCT 38.2 (L) 03/20/2017   PLT 212.0 03/20/2017   GLUCOSE 128 (H) 03/20/2017   CHOL 154 12/24/2013   TRIG 287.0 (H) 12/24/2013   HDL 18.10 (L) 12/24/2013   LDLDIRECT 92.1 12/24/2013   LDLCALC 102 (H) 06/13/2013   ALT 25 03/20/2017   AST 16 03/20/2017   NA 138 03/20/2017   K 4.1 03/20/2017   CL 100 03/20/2017   CREATININE 1.52 (H) 03/20/2017   BUN 26 (H) 03/20/2017   CO2 27 03/20/2017   TSH 1.36 03/20/2017   PSA 0.58 12/24/2013   HGBA1C 5.9 03/20/2017    Dg Chest 2 View  Result Date: 04/25/2017 CLINICAL DATA:  Cough and congestion for 3 days EXAM: CHEST - 2 VIEW COMPARISON:  01/13/2016 FINDINGS: Normal heart size. Lungs clear. No pneumothorax. No pleural effusion. IMPRESSION: No active cardiopulmonary disease. Electronically Signed   By: Marybelle Killings M.D.   On: 04/25/2017 08:04    Assessment & Plan:   There are no diagnoses linked to this encounter.   No orders of the defined types were placed in this encounter.    Follow-up: No follow-ups on file.  Walker Kehr, MD

## 2017-09-06 NOTE — Assessment & Plan Note (Signed)
Declined treatment.

## 2017-09-06 NOTE — Assessment & Plan Note (Signed)
W/c, cane

## 2017-09-06 NOTE — Assessment & Plan Note (Signed)
Cane, w/c No LBP

## 2017-09-06 NOTE — Assessment & Plan Note (Signed)
Wt Readings from Last 3 Encounters:  09/06/17 183 lb (83 kg)  04/24/17 178 lb (80.7 kg)  03/20/17 185 lb (83.9 kg)

## 2017-09-06 NOTE — Assessment & Plan Note (Signed)
Cont w/Rx Derm ref declined

## 2017-09-06 NOTE — Assessment & Plan Note (Signed)
A1c

## 2017-10-26 ENCOUNTER — Other Ambulatory Visit: Payer: Self-pay | Admitting: Internal Medicine

## 2017-11-15 ENCOUNTER — Other Ambulatory Visit: Payer: Self-pay | Admitting: Internal Medicine

## 2017-12-18 ENCOUNTER — Other Ambulatory Visit: Payer: Self-pay | Admitting: Internal Medicine

## 2018-01-25 ENCOUNTER — Other Ambulatory Visit: Payer: Self-pay | Admitting: Internal Medicine

## 2018-02-08 ENCOUNTER — Other Ambulatory Visit: Payer: Self-pay | Admitting: Internal Medicine

## 2018-02-22 ENCOUNTER — Other Ambulatory Visit: Payer: Self-pay | Admitting: Internal Medicine

## 2018-03-10 IMAGING — MR MR LUMBAR SPINE W/O CM
4 of 7 series · 18 of 48 positions shown · non-contrast
Comparison: None.

CLINICAL DATA: Low back pain radiating down both legs.

EXAM:
MRI LUMBAR SPINE WITHOUT CONTRAST
TECHNIQUE: Multiplanar, multisequence MR imaging of the lumbar spine was
performed. No intravenous contrast was administered.

[Series 7: T2 · sagittal · 4.0mm · 0.73mm/px · 6 of 15 slices shown (1 of 3)]
[im 1/15]
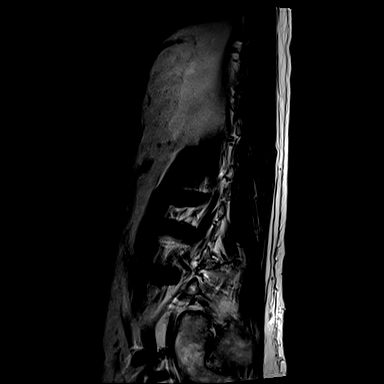
[im 3/15]
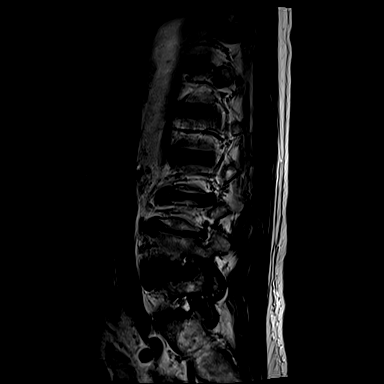
[im 6/15]
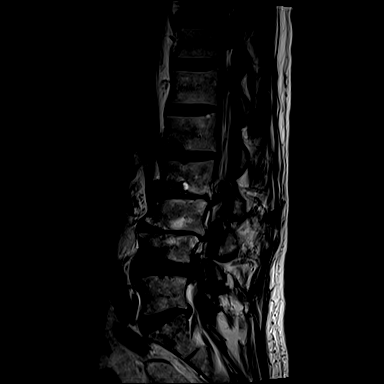
[im 9/15]
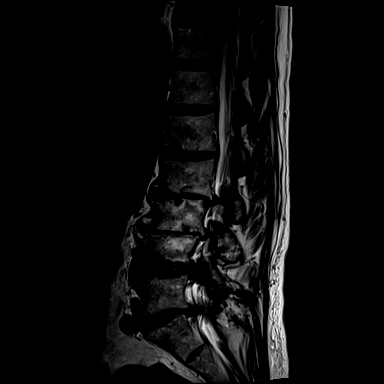
[im 12/15]
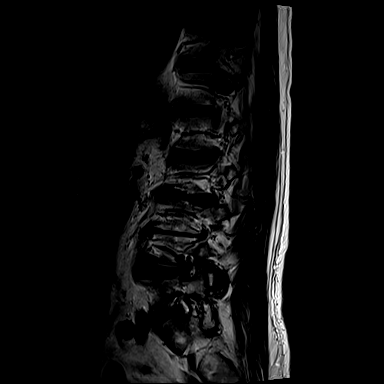
[im 15/15]
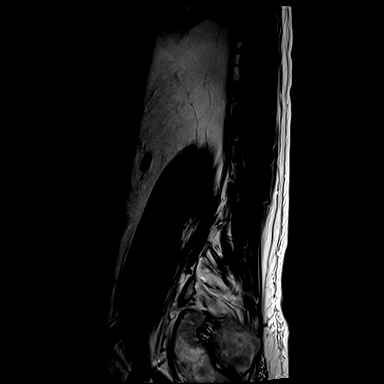

[Series 8: T1 · sagittal · 4.0mm · 0.73mm/px · 3 of 15 slices shown]
[im 3/15]
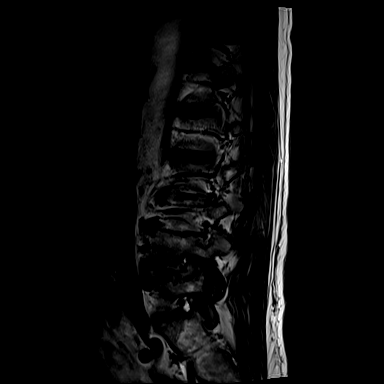
[im 9/15]
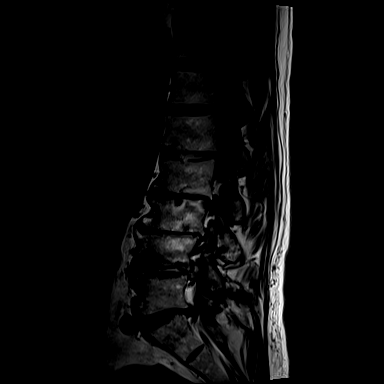
[im 15/15]
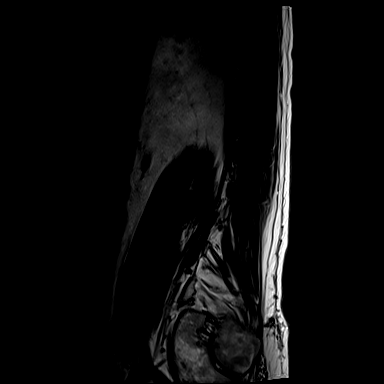

[Series 12: T2 · axial · 4.0mm · 0.28mm/px · z∈[-123,-43]mm · 6 of 20 slices shown (2 of 3)]
[im 1/20]
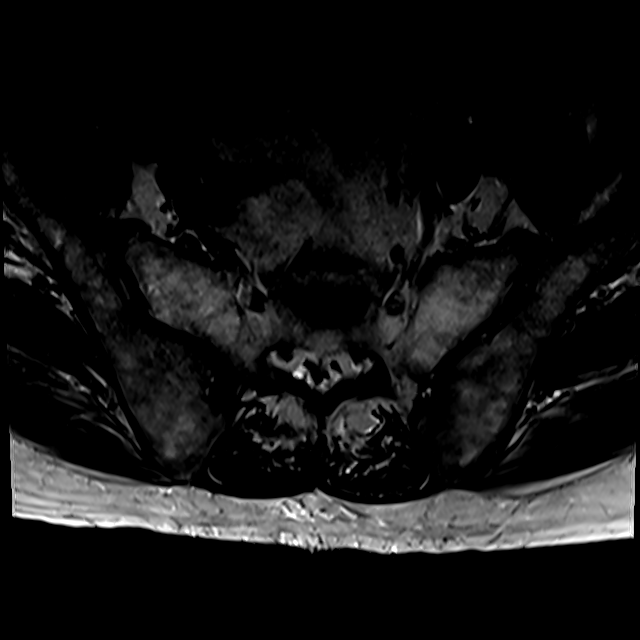
[im 3/20]
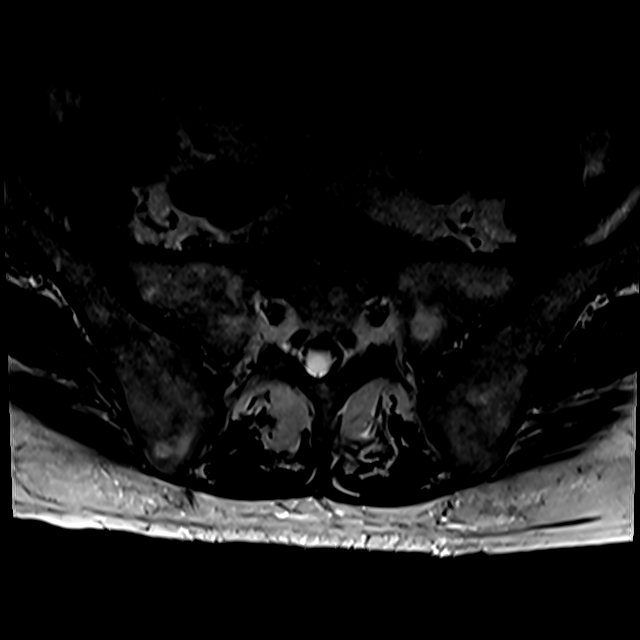
[im 6/20]
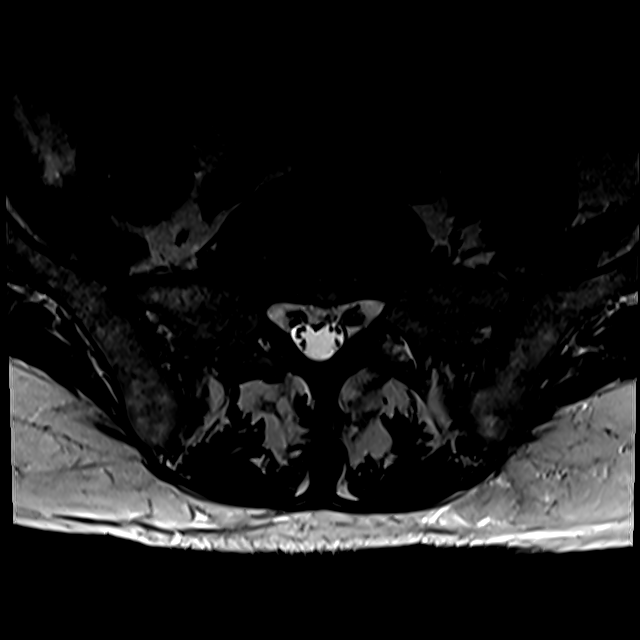
[im 9/20]
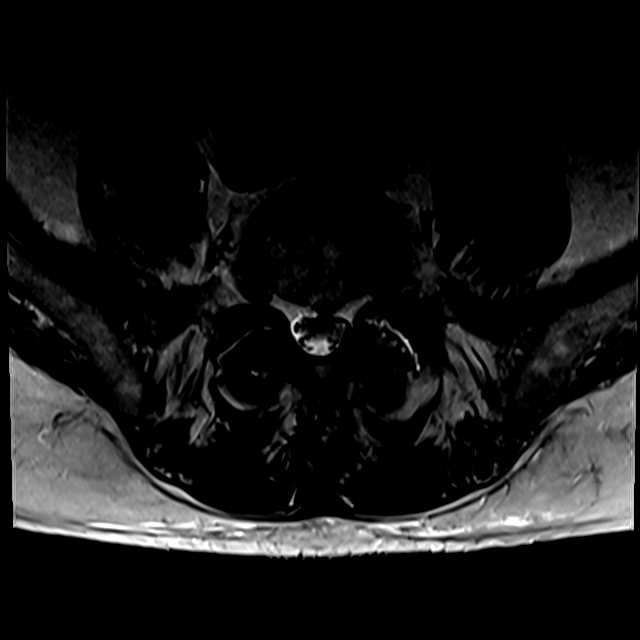
[im 11/20]
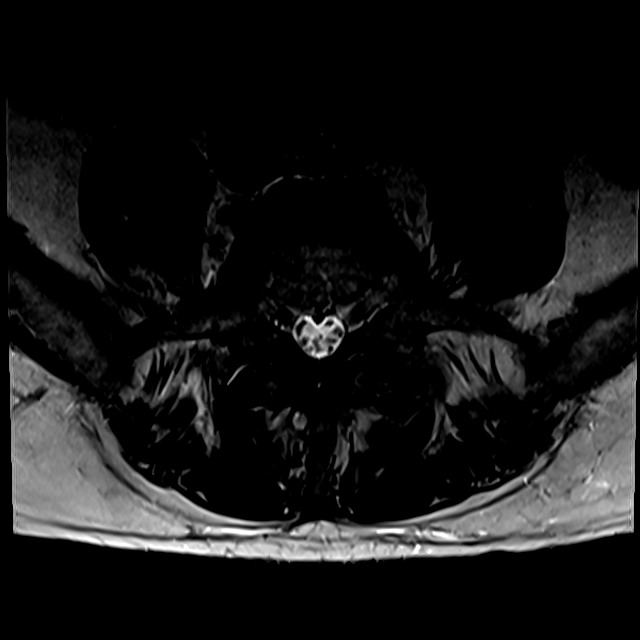
[im 17/20]
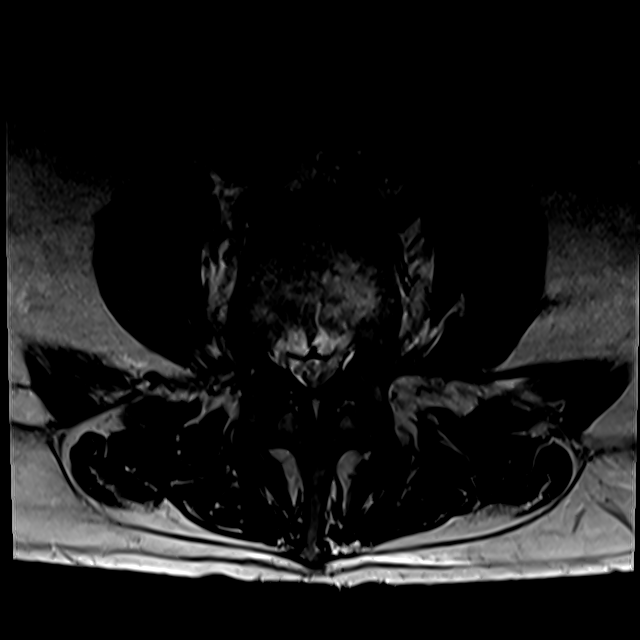

[Series 13: T2 · axial · 4.0mm · 0.28mm/px · z∈[-12,+42]mm · 3 of 17 slices shown (3 of 3)]
[im 3/17]
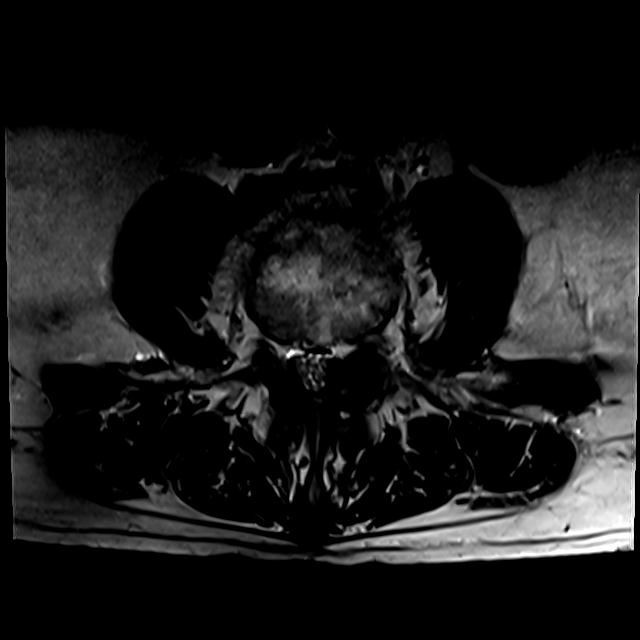
[im 9/17]
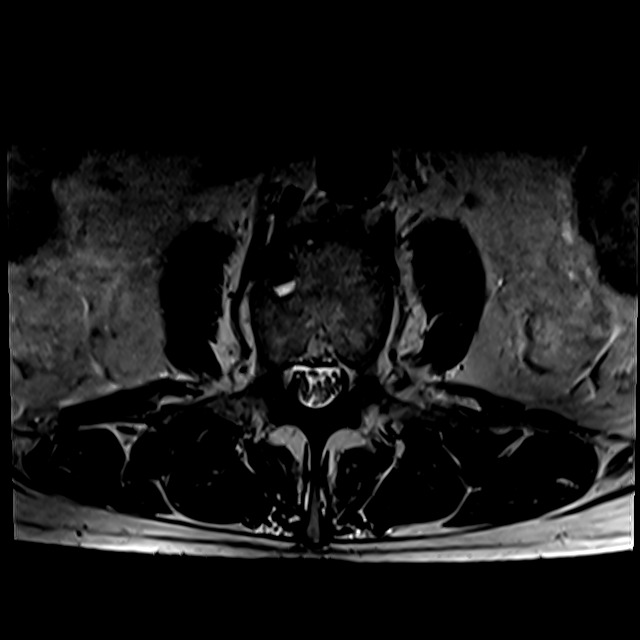
[im 14/17]
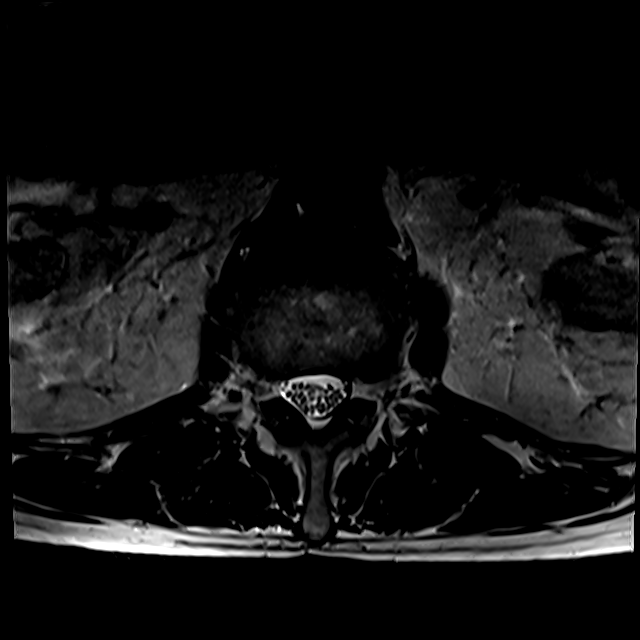

[18 of 48 positions shown; findings below may reference images not displayed]

FINDINGS: Segmentation: Normal. The lowest disc space is considered to be
L5-S1.

Alignment:  Grade 1 retrolisthesis at L3-L4.

Vertebrae: No acute compression fracture, facet edema or focal
marrow lesion.

Conus medullaris: Extends to the L1 level and appears normal.

Paraspinal and other soft tissues: The visualized aorta, IVC and
iliac vessels are normal. The visualized retroperitoneal organs and
paraspinal soft tissues are normal.

Disc levels:

T12-L1: Evaluated on sagittal images only. No significant disc
herniation, spinal canal stenosis or neuroforaminal narrowing.

L1-L2: Schmorl's nodes at both endplates. No disc bulge. Normal
facets. No spinal canal stenosis. No neuroforaminal stenosis.

L2-L3: Severe disc space narrowing. Endplate osteophyte formation
and diffuse disc bulge. Moderate spinal canal stenosis. Moderate
bilateral neuroforaminal stenosis.

L3-L4: Severe disc space narrowing and diffuse disc bulge with mild
bilateral facet hypertrophy. Moderate spinal canal stenosis. Severe
bilateral neuroforaminal stenosis.

L4-L5: Large right subarticular disc extrusion with superior
migration. Severe bilateral facet hypertrophy with fluid in the
widened facet joint spaces. Severe spinal canal stenosis. Severe
bilateral neuroforaminal stenosis.

L5-S1: Severe bilateral facet hypertrophy. Medium-sized left
subarticular disc protrusion narrowing the left lateral recess and
displacing the descending left S1 nerve root. No central spinal
canal stenosis. Mild left neuroforaminal stenosis.
IMPRESSION: 1. Severe central spinal canal stenosis at L4-L5 secondary to severe
facet arthrosis and large right subarticular disc extrusion with
superior migration. This also results in severe bilateral foraminal
stenosis.
2. Left lateral recess narrowing at L5-S1 secondary to severe
bilateral facet arthrosis and subarticular disc protrusion on the
left. Correlate for left S1 radiculopathy.
3. Moderate spinal canal stenosis and moderate to severe bilateral
foraminal stenosis at L2-L3 and L3-L4.

## 2018-03-21 ENCOUNTER — Ambulatory Visit: Payer: Medicare Other | Admitting: Internal Medicine

## 2018-03-28 ENCOUNTER — Other Ambulatory Visit: Payer: Self-pay | Admitting: Internal Medicine

## 2018-04-23 IMAGING — CR DG LUMBAR SPINE 2-3V
1 series · 1 of 1 positions shown · non-contrast
Comparison: 12/30/2015 and MRI 11/28/2015

CLINICAL DATA: Lumbar spine surgery.

EXAM:
LUMBAR SPINE - 2-3 VIEW

[xtable lateral]
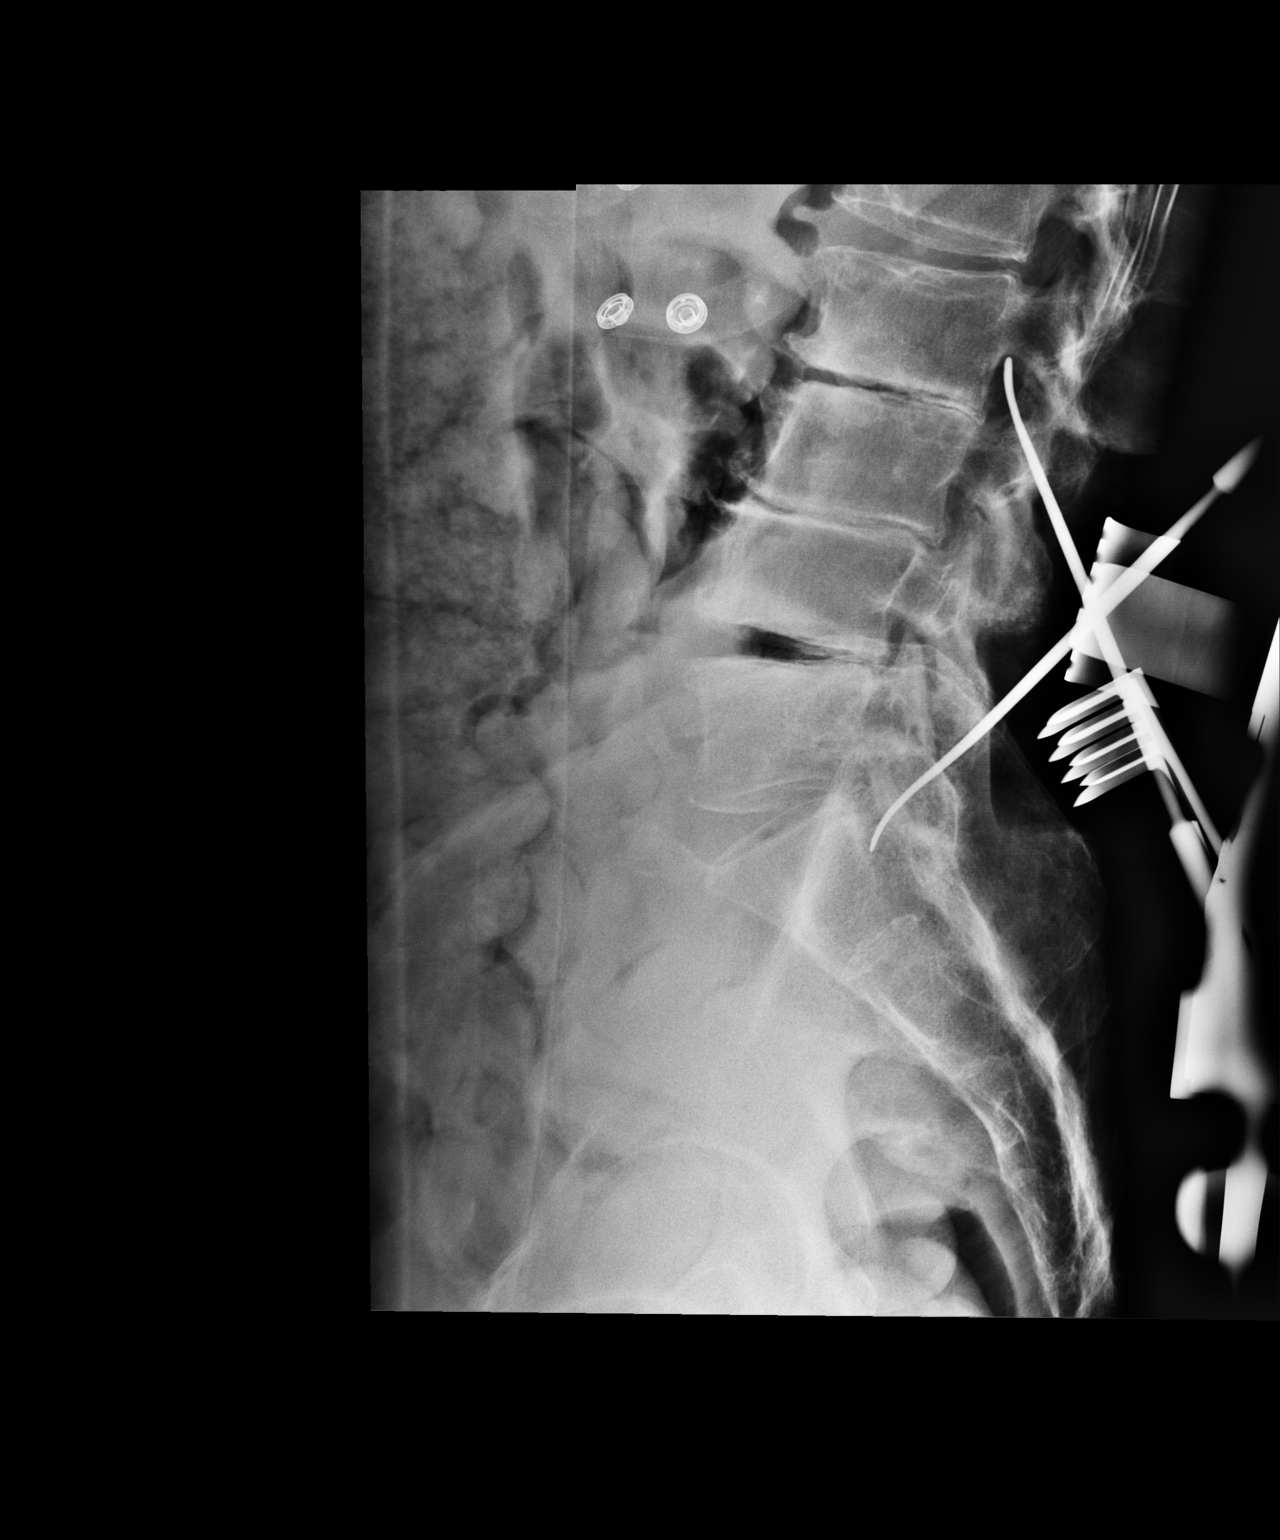

[1 of 1 positions shown; findings below may reference images not displayed]

FINDINGS: Two cross-table lateral views of the lumbar spine were obtained.
Again noted is multilevel disc disease with marked disc space
narrowing at L3-L4. The image labeled #1 demonstrates surgical
hardware posterior to L3-L4. The second image has a surgical marker
posterior to L2 and S1.
IMPRESSION: Surgical marking for lumbar spine surgery.

## 2018-04-26 ENCOUNTER — Other Ambulatory Visit: Payer: Self-pay | Admitting: Internal Medicine

## 2018-04-27 IMAGING — DX DG ABDOMEN 1V
1 series · 1 of 1 positions shown · non-contrast
Comparison: None.

CLINICAL DATA: Constipation

EXAM:
ABDOMEN - 1 VIEW

[abdomen kub]
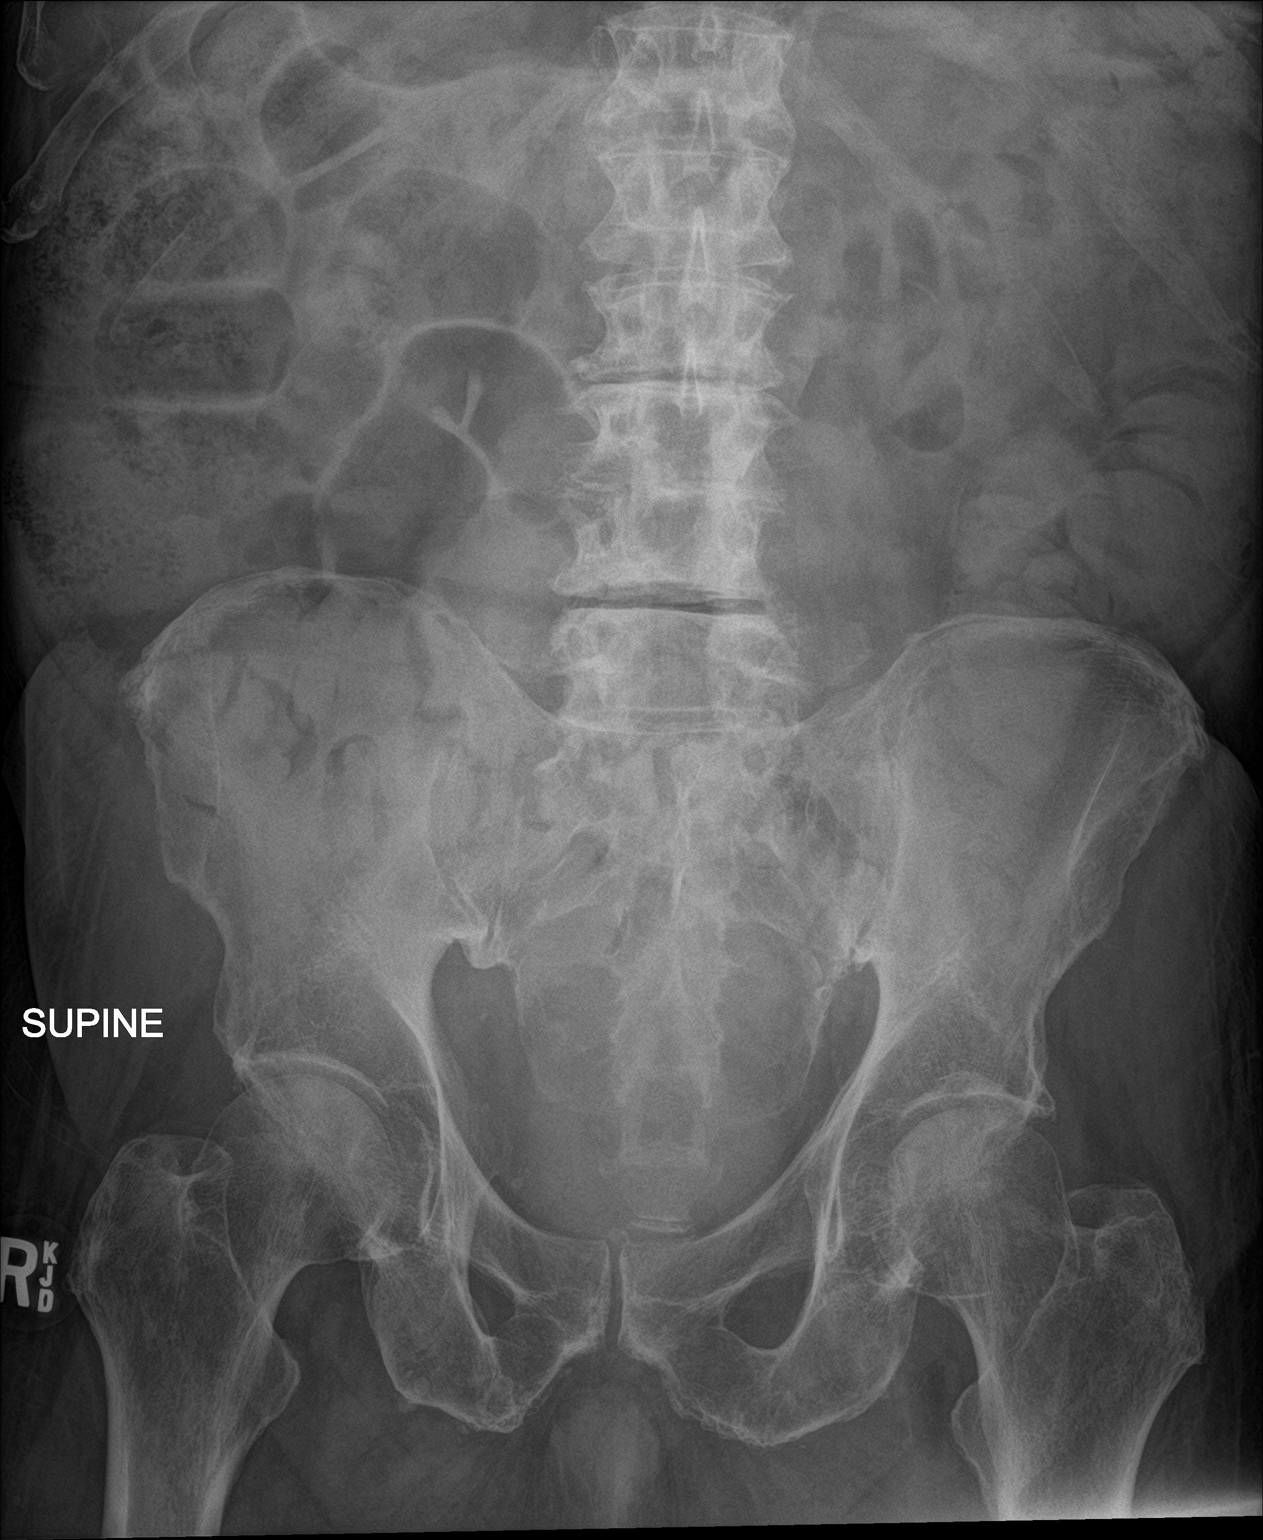

[1 of 1 positions shown; findings below may reference images not displayed]

FINDINGS: There is diffuse stool throughout the colon. There is no bowel
dilatation or air-fluid level suggesting bowel obstruction. No free
air. There is moderate osteoarthritic change in the lumbar spine.
IMPRESSION: Diffuse stool throughout colon, consistent with stated diagnosis of
constipation. No bowel obstruction or free air evident.

## 2018-05-09 ENCOUNTER — Ambulatory Visit: Payer: Medicare Other | Admitting: Internal Medicine

## 2018-05-09 IMAGING — CR DG ABDOMEN 1V
1 series · 1 of 1 positions shown · non-contrast
Comparison: Abdominal radiograph performed 01/15/2016

CLINICAL DATA: Acute onset of loose stools.  Initial encounter.

EXAM:
ABDOMEN - 1 VIEW

[abdomen kub]
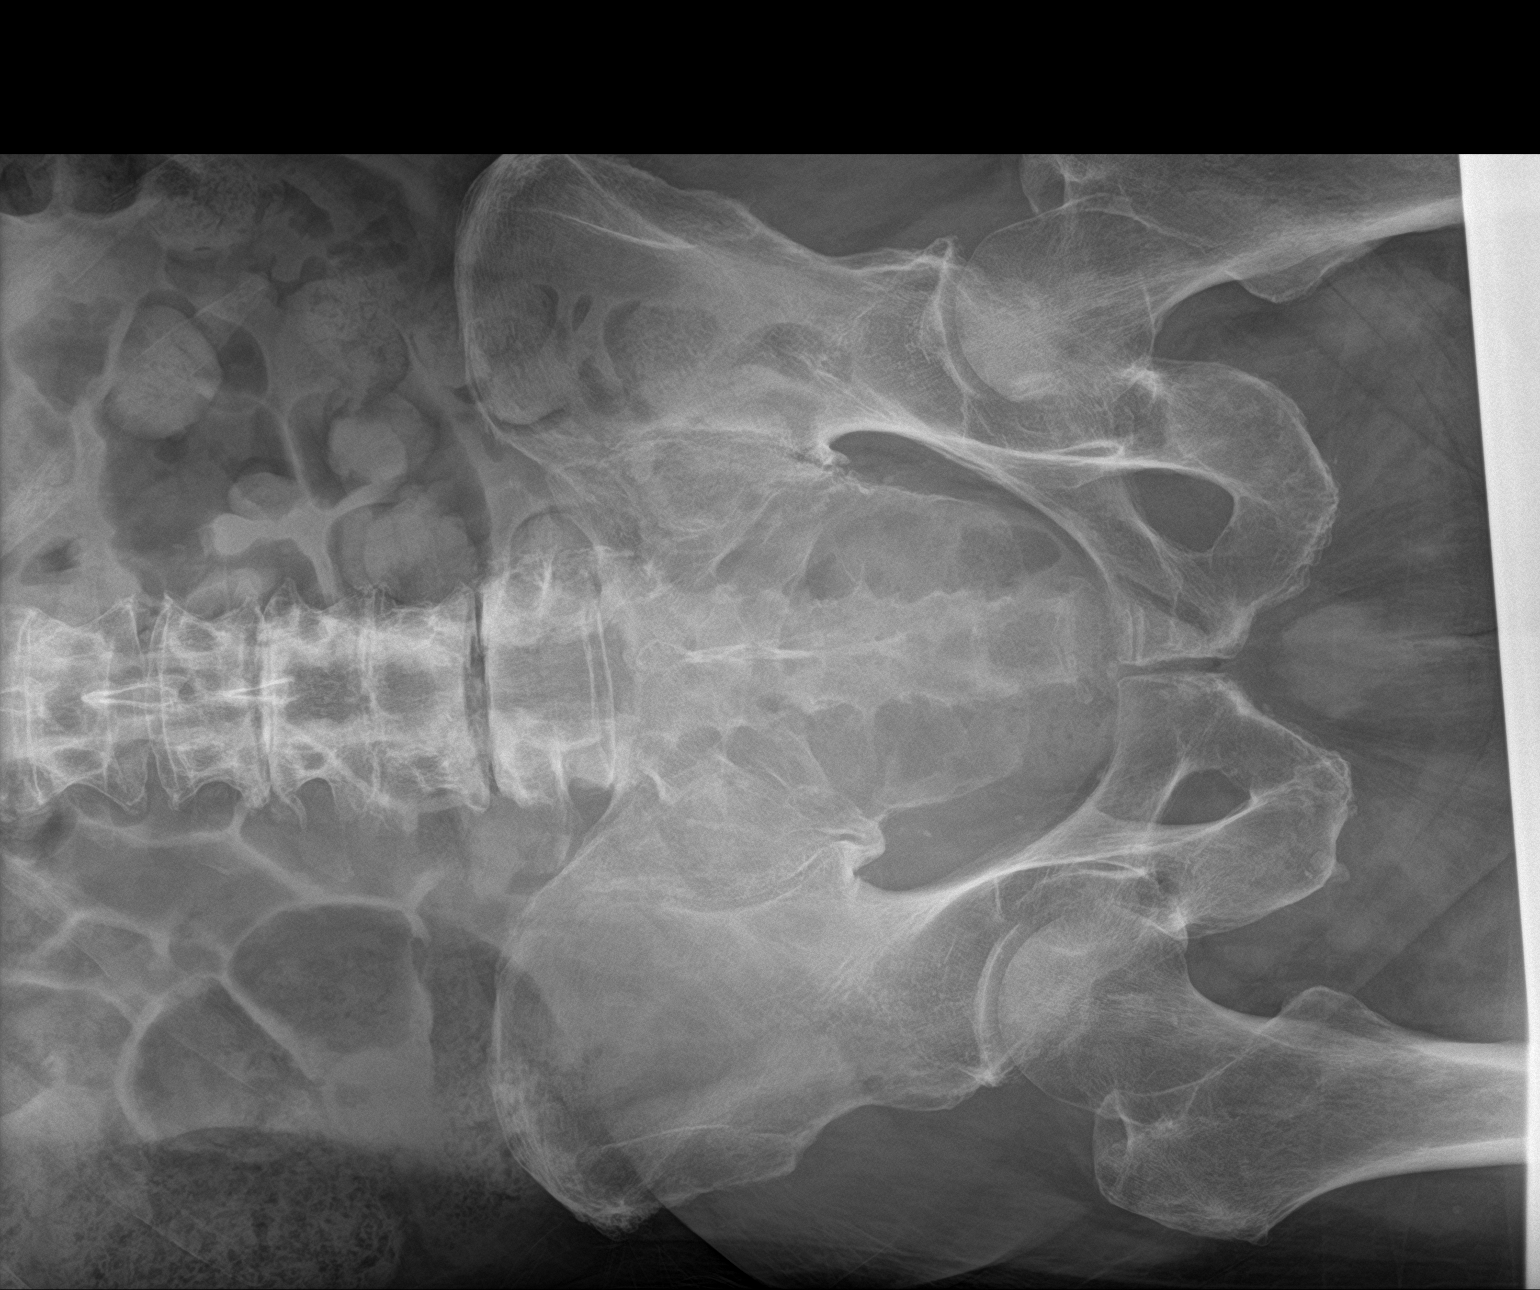

[1 of 1 positions shown; findings below may reference images not displayed]

FINDINGS: The visualized bowel gas pattern is unremarkable. Scattered air and
stool filled loops of colon are seen; no abnormal dilatation of
small bowel loops is seen to suggest small bowel obstruction. No
free intra-abdominal air is identified, though evaluation for free
air is limited on a single supine view.

Mild degenerative change is noted at the lower lumbar spine; the
sacroiliac joints are unremarkable in appearance.
IMPRESSION: Unremarkable bowel gas pattern; no free intra-abdominal air seen.
Small to moderate amount of stool noted in the colon.

## 2018-05-30 ENCOUNTER — Other Ambulatory Visit: Payer: Self-pay | Admitting: Internal Medicine

## 2018-06-27 ENCOUNTER — Ambulatory Visit (INDEPENDENT_AMBULATORY_CARE_PROVIDER_SITE_OTHER): Payer: Medicare Other | Admitting: Internal Medicine

## 2018-06-27 ENCOUNTER — Other Ambulatory Visit: Payer: Self-pay

## 2018-06-27 ENCOUNTER — Encounter: Payer: Self-pay | Admitting: Internal Medicine

## 2018-06-27 ENCOUNTER — Other Ambulatory Visit (INDEPENDENT_AMBULATORY_CARE_PROVIDER_SITE_OTHER): Payer: Medicare Other

## 2018-06-27 DIAGNOSIS — R269 Unspecified abnormalities of gait and mobility: Secondary | ICD-10-CM | POA: Diagnosis not present

## 2018-06-27 DIAGNOSIS — M5415 Radiculopathy, thoracolumbar region: Secondary | ICD-10-CM

## 2018-06-27 DIAGNOSIS — I10 Essential (primary) hypertension: Secondary | ICD-10-CM | POA: Diagnosis not present

## 2018-06-27 DIAGNOSIS — E782 Mixed hyperlipidemia: Secondary | ICD-10-CM | POA: Diagnosis not present

## 2018-06-27 DIAGNOSIS — N183 Chronic kidney disease, stage 3 unspecified: Secondary | ICD-10-CM

## 2018-06-27 LAB — HEPATIC FUNCTION PANEL
ALT: 20 U/L (ref 0–53)
AST: 16 U/L (ref 0–37)
Albumin: 4.2 g/dL (ref 3.5–5.2)
Alkaline Phosphatase: 68 U/L (ref 39–117)
Bilirubin, Direct: 0 mg/dL (ref 0.0–0.3)
Total Bilirubin: 0.4 mg/dL (ref 0.2–1.2)
Total Protein: 6.8 g/dL (ref 6.0–8.3)

## 2018-06-27 LAB — BASIC METABOLIC PANEL
BUN: 25 mg/dL — ABNORMAL HIGH (ref 6–23)
CO2: 29 mEq/L (ref 19–32)
Calcium: 9.3 mg/dL (ref 8.4–10.5)
Chloride: 98 mEq/L (ref 96–112)
Creatinine, Ser: 1.53 mg/dL — ABNORMAL HIGH (ref 0.40–1.50)
GFR: 44.04 mL/min — ABNORMAL LOW (ref >60.00)
Glucose, Bld: 124 mg/dL — ABNORMAL HIGH (ref 70–99)
Potassium: 4.2 mEq/L (ref 3.5–5.1)
Sodium: 134 mEq/L — ABNORMAL LOW (ref 135–145)

## 2018-06-27 LAB — CBC WITH DIFFERENTIAL/PLATELET
Basophils Absolute: 0.1 10*3/uL (ref 0.0–0.1)
Basophils Relative: 0.5 % (ref 0.0–3.0)
Eosinophils Absolute: 0.2 10*3/uL (ref 0.0–0.7)
Eosinophils Relative: 2.1 % (ref 0.0–5.0)
HCT: 40.7 % (ref 39.0–52.0)
Hemoglobin: 13.9 g/dL (ref 13.0–17.0)
Lymphocytes Relative: 16.6 % (ref 12.0–46.0)
Lymphs Abs: 1.7 10*3/uL (ref 0.7–4.0)
MCHC: 34.2 g/dL (ref 30.0–36.0)
MCV: 98.9 fl (ref 78.0–100.0)
Monocytes Absolute: 0.8 10*3/uL (ref 0.1–1.0)
Monocytes Relative: 7.7 % (ref 3.0–12.0)
Neutro Abs: 7.3 10*3/uL (ref 1.4–7.7)
Neutrophils Relative %: 73.1 % (ref 43.0–77.0)
Platelets: 221 10*3/uL (ref 150.0–400.0)
RBC: 4.12 Mil/uL — ABNORMAL LOW (ref 4.22–5.81)
RDW: 13.4 % (ref 11.5–15.5)
WBC: 10 10*3/uL (ref 4.0–10.5)

## 2018-06-27 LAB — TSH: TSH: 1.12 u[IU]/mL (ref 0.35–4.50)

## 2018-06-27 MED ORDER — ONDANSETRON HCL 4 MG PO TABS: 4.0000 mg | ORAL_TABLET | Freq: Every day | ORAL | 2 refills | Status: DC

## 2018-06-27 MED ORDER — ACYCLOVIR 400 MG PO TABS: 400.0000 mg | ORAL_TABLET | Freq: Two times a day (BID) | ORAL | 3 refills | Status: DC

## 2018-06-27 MED ORDER — GABAPENTIN 100 MG PO CAPS: ORAL_CAPSULE | ORAL | 3 refills | Status: DC

## 2018-06-27 MED ORDER — METOPROLOL SUCCINATE ER 50 MG PO TB24: ORAL_TABLET | ORAL | 3 refills | Status: DC

## 2018-06-27 MED ORDER — TAMSULOSIN HCL 0.4 MG PO CAPS: 0.4000 mg | ORAL_CAPSULE | Freq: Every day | ORAL | 3 refills | Status: DC

## 2018-06-27 MED ORDER — TRIAMCINOLONE ACETONIDE 0.1 % EX OINT: TOPICAL_OINTMENT | CUTANEOUS | 3 refills | Status: DC

## 2018-06-27 NOTE — Assessment & Plan Note (Signed)
Labs

## 2018-06-27 NOTE — Progress Notes (Signed)
Subjective:  Patient ID: Marvin Morrison, male    DOB: 1940/09/02  Age: 78 y.o. MRN: [id number]  CC: No chief complaint on file.   HPI CHRISOPHER PUSTEJOVSKY presents for CKD, HTN, OA, rash f/u  Outpatient Medications Prior to Visit  Medication Sig Dispense Refill  . acyclovir (ZOVIRAX) 400 MG tablet TAKE 1 TABLET BY MOUTH TWICE A DAY 60 tablet 5  . colchicine 0.6 MG tablet Take 1 tablet (0.6 mg total) by mouth 2 (two) times daily as needed. 30 tablet 2  . doxycycline (VIBRA-TABS) 100 MG tablet Take 1 tablet (100 mg total) by mouth 2 (two) times daily. 20 tablet 0  . furosemide (LASIX) 20 MG tablet Take 1-2 tablets (20-40 mg total) by mouth daily as needed. 60 tablet 3  . gabapentin (NEURONTIN) 100 MG capsule TAKE 1 CAPSULE BY MOUTH THREE TIMES A DAY 90 capsule 11  . HYDROcodone-acetaminophen (NORCO/VICODIN) 5-325 MG tablet Take 1 tablet by mouth every 6 (six) hours as needed (mild pain). 20 tablet 0  . lidocaine (LIDODERM) 5 % Place 1 patch onto the skin daily. Remove & Discard patch within 12 hours or as directed by MD 30 patch 0  . metoprolol succinate (TOPROL-XL) 50 MG 24 hr tablet TAKE 1 TABLET EVERY DAY. TAKE IMMEDIATELY FOLLOWING A MEAL 90 tablet 3  . Multiple Vitamin (MULTIVITAMIN WITH MINERALS) TABS tablet Take 1 tablet by mouth daily.    . ondansetron (ZOFRAN) 4 MG tablet Take 1 tablet (4 mg total) by mouth daily. Keep June appt future refills 30 tablet 2  . pantoprazole (PROTONIX) 40 MG tablet TAKE 1 TABLET BY MOUTH EVERY DAY 90 tablet 3  . predniSONE (DELTASONE) 20 MG tablet Take 1 tablet (20 mg total) by mouth daily with breakfast. 5 tablet 0  . tamsulosin (FLOMAX) 0.4 MG CAPS capsule Take 1 capsule (0.4 mg total) by mouth daily. 30 capsule 11  . triamcinolone ointment (KENALOG) 0.1 % APPLY TO AFFECTED AREA TWICE A DAY 80 g 0  . vitamin C (VITAMIN C) 250 MG tablet Take 1 tablet (250 mg total) by mouth 3 (three) times daily with meals.     No facility-administered medications prior to  visit.     ROS: Review of Systems  Constitutional: Positive for fatigue. Negative for appetite change and unexpected weight change.  HENT: Negative for congestion, nosebleeds, sneezing, sore throat and trouble swallowing.   Eyes: Negative for itching and visual disturbance.  Respiratory: Negative for cough.   Cardiovascular: Negative for chest pain, palpitations and leg swelling.  Gastrointestinal: Positive for diarrhea. Negative for abdominal distention, blood in stool and nausea.  Genitourinary: Negative for frequency and hematuria.  Musculoskeletal: Positive for arthralgias, back pain and gait problem. Negative for joint swelling and neck pain.  Skin: Positive for rash.  Neurological: Negative for dizziness, tremors, speech difficulty and weakness.  Psychiatric/Behavioral: Negative for agitation, dysphoric mood, sleep disturbance and suicidal ideas. The patient is not nervous/anxious.     Objective:  BP 132/84 (BP Location: Right Arm, Patient Position: Sitting, Cuff Size: Normal)   Pulse 96   Temp 97.6 F (36.4 C) (Oral)   Ht 6' (1.829 m)   SpO2 96%   BMI 24.82 kg/m   BP Readings from Last 3 Encounters:  06/27/18 132/84  09/06/17 122/80  04/24/17 130/72    Wt Readings from Last 3 Encounters:  09/06/17 183 lb (83 kg)  04/24/17 178 lb (80.7 kg)  03/20/17 185 lb (83.9 kg)    Physical Exam  Constitutional:      General: He is not in acute distress.    Appearance: He is well-developed.     Comments: NAD  Eyes:     Conjunctiva/sclera: Conjunctivae normal.     Pupils: Pupils are equal, round, and reactive to light.  Neck:     Musculoskeletal: Normal range of motion.     Thyroid: No thyromegaly.     Vascular: No JVD.  Cardiovascular:     Rate and Rhythm: Normal rate and regular rhythm.     Heart sounds: Normal heart sounds. No murmur. No friction rub. No gallop.   Pulmonary:     Effort: Pulmonary effort is normal. No respiratory distress.     Breath sounds: Normal  breath sounds. No wheezing or rales.  Chest:     Chest wall: No tenderness.  Abdominal:     General: Bowel sounds are normal. There is no distension.     Palpations: Abdomen is soft. There is no mass.     Tenderness: There is no abdominal tenderness. There is no guarding or rebound.  Musculoskeletal: Normal range of motion.        General: Tenderness present.  Lymphadenopathy:     Cervical: No cervical adenopathy.  Skin:    General: Skin is warm and dry.     Findings: No rash.  Neurological:     Mental Status: He is alert and oriented to person, place, and time.     Cranial Nerves: No cranial nerve deficit.     Motor: No abnormal muscle tone.     Coordination: Coordination abnormal.     Gait: Gait normal.     Deep Tendon Reflexes: Reflexes are normal and symmetric.  Psychiatric:        Behavior: Behavior normal.        Thought Content: Thought content normal.        Judgment: Judgment normal.   rash on face  Lab Results  Component Value Date   WBC 9.4 09/06/2017   HGB 13.7 09/06/2017   HCT 40.1 09/06/2017   PLT 196.0 09/06/2017   GLUCOSE 133 (H) 09/06/2017   CHOL 269 (H) 09/06/2017   TRIG (H) 09/06/2017    432.0 Triglyceride is over 400; calculations on Lipids are invalid.   HDL 30.70 (L) 09/06/2017   LDLDIRECT 153.0 09/06/2017   LDLCALC 102 (H) 06/13/2013   ALT 19 09/06/2017   AST 15 09/06/2017   NA 140 09/06/2017   K 3.9 09/06/2017   CL 102 09/06/2017   CREATININE 1.53 (H) 09/06/2017   BUN 25 (H) 09/06/2017   CO2 29 09/06/2017   TSH 1.15 09/06/2017   PSA 0.89 09/06/2017   HGBA1C 5.9 03/20/2017    Dg Chest 2 View  Result Date: 04/25/2017 CLINICAL DATA:  Cough and congestion for 3 days EXAM: CHEST - 2 VIEW COMPARISON:  01/13/2016 FINDINGS: Normal heart size. Lungs clear. No pneumothorax. No pleural effusion. IMPRESSION: No active cardiopulmonary disease. Electronically Signed   By: Marybelle Killings M.D.   On: 04/25/2017 08:04    Assessment & Plan:   There are  no diagnoses linked to this encounter.   No orders of the defined types were placed in this encounter.    Follow-up: No follow-ups on file.  Walker Kehr, MD

## 2018-06-27 NOTE — Assessment & Plan Note (Signed)
Chronic pain 

## 2018-06-27 NOTE — Assessment & Plan Note (Signed)
Furosemide, Toprol

## 2018-06-27 NOTE — Assessment & Plan Note (Signed)
W/c Gabapentin

## 2018-07-17 ENCOUNTER — Telehealth: Payer: Self-pay | Admitting: Internal Medicine

## 2018-07-17 NOTE — Telephone Encounter (Signed)
Attempted to contact patient, but patient stated he could not hear the phone call will try to call patient again at a later time. SF

## 2018-08-02 ENCOUNTER — Other Ambulatory Visit: Payer: Self-pay

## 2018-09-26 ENCOUNTER — Other Ambulatory Visit: Payer: Self-pay | Admitting: Internal Medicine

## 2018-10-29 ENCOUNTER — Other Ambulatory Visit: Payer: Self-pay | Admitting: Internal Medicine

## 2018-11-26 ENCOUNTER — Other Ambulatory Visit: Payer: Self-pay

## 2018-12-31 ENCOUNTER — Other Ambulatory Visit: Payer: Self-pay | Admitting: Internal Medicine

## 2019-01-28 ENCOUNTER — Other Ambulatory Visit: Payer: Self-pay | Admitting: Internal Medicine

## 2019-01-30 ENCOUNTER — Other Ambulatory Visit: Payer: Self-pay | Admitting: Internal Medicine

## 2019-02-11 ENCOUNTER — Other Ambulatory Visit: Payer: Self-pay | Admitting: Internal Medicine

## 2019-02-11 ENCOUNTER — Telehealth: Payer: Self-pay

## 2019-02-11 MED ORDER — PANTOPRAZOLE SODIUM 40 MG PO TBEC: 40.0000 mg | DELAYED_RELEASE_TABLET | Freq: Every day | ORAL | 0 refills | Status: DC

## 2019-02-11 NOTE — Telephone Encounter (Signed)
New message      1. Which medications need to be refilled? (please list name of each medication and dose if known) pantoprazole (PROTONIX) 40 MG tablet  2. Which pharmacy/location (including street and city if local pharmacy) is medication to be sent to? [facility] [address]   3. Do they need a 30 day or 90 day supply? 90 days supply

## 2019-02-11 NOTE — Telephone Encounter (Signed)
        1. Which medications need to be refilled? (please list name of each medication and dose if known) pantoprazole (PROTONIX) 40 MG tablet  2. Which pharmacy/location (including street and city if local pharmacy) is medication to be sent to? [id number] [place] [place] [address] [facility] [address]  3. Do they need a 30 day or 90 day supply? [place]

## 2019-02-12 NOTE — Telephone Encounter (Signed)
RX sent yesterday

## 2019-02-25 ENCOUNTER — Other Ambulatory Visit: Payer: Self-pay | Admitting: Internal Medicine

## 2019-02-25 MED ORDER — TRIAMCINOLONE ACETONIDE 0.1 % EX OINT: TOPICAL_OINTMENT | CUTANEOUS | 2 refills | Status: DC

## 2019-02-25 NOTE — Telephone Encounter (Signed)
triamcinolone ointment (KENALOG) 0.1 % Patient is requesting a refill on the following medication. Send to pharmacy on file.

## 2019-03-27 ENCOUNTER — Other Ambulatory Visit: Payer: Self-pay | Admitting: Internal Medicine

## 2019-03-27 ENCOUNTER — Other Ambulatory Visit: Payer: Self-pay

## 2019-03-27 ENCOUNTER — Encounter: Payer: Self-pay | Admitting: Internal Medicine

## 2019-03-27 ENCOUNTER — Ambulatory Visit (INDEPENDENT_AMBULATORY_CARE_PROVIDER_SITE_OTHER): Payer: Medicare Other

## 2019-03-27 ENCOUNTER — Ambulatory Visit (INDEPENDENT_AMBULATORY_CARE_PROVIDER_SITE_OTHER): Payer: Medicare Other | Admitting: Internal Medicine

## 2019-03-27 VITALS — BP 144/86 | HR 90 | Temp 98.7°F | Ht 72.0 in | Wt 175.0 lb

## 2019-03-27 DIAGNOSIS — E785 Hyperlipidemia, unspecified: Secondary | ICD-10-CM | POA: Diagnosis not present

## 2019-03-27 DIAGNOSIS — R202 Paresthesia of skin: Secondary | ICD-10-CM | POA: Diagnosis not present

## 2019-03-27 DIAGNOSIS — S99921A Unspecified injury of right foot, initial encounter: Secondary | ICD-10-CM | POA: Diagnosis not present

## 2019-03-27 DIAGNOSIS — M79671 Pain in right foot: Secondary | ICD-10-CM

## 2019-03-27 DIAGNOSIS — M48062 Spinal stenosis, lumbar region with neurogenic claudication: Secondary | ICD-10-CM | POA: Diagnosis not present

## 2019-03-27 DIAGNOSIS — N1832 Chronic kidney disease, stage 3b: Secondary | ICD-10-CM

## 2019-03-27 DIAGNOSIS — D500 Iron deficiency anemia secondary to blood loss (chronic): Secondary | ICD-10-CM

## 2019-03-27 DIAGNOSIS — R269 Unspecified abnormalities of gait and mobility: Secondary | ICD-10-CM | POA: Diagnosis not present

## 2019-03-27 DIAGNOSIS — M7989 Other specified soft tissue disorders: Secondary | ICD-10-CM | POA: Diagnosis not present

## 2019-03-27 DIAGNOSIS — N320 Bladder-neck obstruction: Secondary | ICD-10-CM | POA: Diagnosis not present

## 2019-03-27 DIAGNOSIS — R739 Hyperglycemia, unspecified: Secondary | ICD-10-CM | POA: Diagnosis not present

## 2019-03-27 DIAGNOSIS — I10 Essential (primary) hypertension: Secondary | ICD-10-CM | POA: Diagnosis not present

## 2019-03-27 DIAGNOSIS — E559 Vitamin D deficiency, unspecified: Secondary | ICD-10-CM

## 2019-03-27 LAB — HEPATIC FUNCTION PANEL
ALT: 17 U/L (ref 0–53)
AST: 16 U/L (ref 0–37)
Albumin: 4.1 g/dL (ref 3.5–5.2)
Alkaline Phosphatase: 75 U/L (ref 39–117)
Bilirubin, Direct: 0.1 mg/dL (ref 0.0–0.3)
Total Bilirubin: 0.4 mg/dL (ref 0.2–1.2)
Total Protein: 6.3 g/dL (ref 6.0–8.3)

## 2019-03-27 LAB — CBC WITH DIFFERENTIAL/PLATELET
Basophils Absolute: 0.1 10*3/uL (ref 0.0–0.1)
Basophils Relative: 0.6 % (ref 0.0–3.0)
Eosinophils Absolute: 0.2 10*3/uL (ref 0.0–0.7)
Eosinophils Relative: 1.6 % (ref 0.0–5.0)
HCT: 38.5 % — ABNORMAL LOW (ref 39.0–52.0)
Hemoglobin: 13.2 g/dL (ref 13.0–17.0)
Lymphocytes Relative: 15.9 % (ref 12.0–46.0)
Lymphs Abs: 1.6 10*3/uL (ref 0.7–4.0)
MCHC: 34.3 g/dL (ref 30.0–36.0)
MCV: 99.7 fl (ref 78.0–100.0)
Monocytes Absolute: 0.9 10*3/uL (ref 0.1–1.0)
Monocytes Relative: 9 % (ref 3.0–12.0)
Neutro Abs: 7.4 10*3/uL (ref 1.4–7.7)
Neutrophils Relative %: 72.9 % (ref 43.0–77.0)
Platelets: 214 10*3/uL (ref 150.0–400.0)
RBC: 3.86 Mil/uL — ABNORMAL LOW (ref 4.22–5.81)
RDW: 13.1 % (ref 11.5–15.5)
WBC: 10.1 10*3/uL (ref 4.0–10.5)

## 2019-03-27 LAB — URINALYSIS
Bilirubin Urine: NEGATIVE
Ketones, ur: NEGATIVE
Leukocytes,Ua: NEGATIVE
Nitrite: NEGATIVE
Specific Gravity, Urine: 1.015 (ref 1.000–1.030)
Total Protein, Urine: NEGATIVE
Urine Glucose: NEGATIVE
Urobilinogen, UA: 0.2 (ref 0.0–1.0)
pH: 6 (ref 5.0–8.0)

## 2019-03-27 LAB — LIPID PANEL
Cholesterol: 238 mg/dL — ABNORMAL HIGH (ref 0–200)
HDL: 29.4 mg/dL — ABNORMAL LOW (ref >39.00)
NonHDL: 208.16
Total CHOL/HDL Ratio: 8
Triglycerides: 304 mg/dL — ABNORMAL HIGH (ref 0.0–149.0)
VLDL: 60.8 mg/dL — ABNORMAL HIGH (ref 0.0–40.0)

## 2019-03-27 LAB — PSA: PSA: 0.69 ng/mL (ref 0.10–4.00)

## 2019-03-27 LAB — VITAMIN B12: Vitamin B-12: >1500 pg/mL — ABNORMAL HIGH (ref 211–911)

## 2019-03-27 LAB — LDL CHOLESTEROL, DIRECT: Direct LDL: 141 mg/dL

## 2019-03-27 LAB — HEMOGLOBIN A1C: Hgb A1c MFr Bld: 5.6 % (ref 4.6–6.5)

## 2019-03-27 LAB — TSH: TSH: 1.23 u[IU]/mL (ref 0.35–4.50)

## 2019-03-27 MED ORDER — GABAPENTIN 100 MG PO CAPS: ORAL_CAPSULE | ORAL | 3 refills | Status: DC

## 2019-03-27 MED ORDER — MUPIROCIN 2 % EX OINT: TOPICAL_OINTMENT | CUTANEOUS | 0 refills | Status: DC

## 2019-03-27 MED ORDER — TAMSULOSIN HCL 0.4 MG PO CAPS: 0.4000 mg | ORAL_CAPSULE | Freq: Every day | ORAL | 3 refills | Status: DC

## 2019-03-27 MED ORDER — PANTOPRAZOLE SODIUM 40 MG PO TBEC: 40.0000 mg | DELAYED_RELEASE_TABLET | Freq: Every day | ORAL | 3 refills | Status: DC

## 2019-03-27 MED ORDER — TRIAMCINOLONE ACETONIDE 0.1 % EX OINT: TOPICAL_OINTMENT | CUTANEOUS | 2 refills | Status: DC

## 2019-03-27 MED ORDER — ACYCLOVIR 400 MG PO TABS: 400.0000 mg | ORAL_TABLET | Freq: Two times a day (BID) | ORAL | 3 refills | Status: DC

## 2019-03-27 MED ORDER — METOPROLOL SUCCINATE ER 50 MG PO TB24: ORAL_TABLET | ORAL | 3 refills | Status: DC

## 2019-03-27 NOTE — Assessment & Plan Note (Signed)
In a w/c 

## 2019-03-27 NOTE — Assessment & Plan Note (Signed)
Labs

## 2019-03-27 NOTE — Assessment & Plan Note (Signed)
LS spine MRI - spinal stenosis Slow improving LE paresis

## 2019-03-27 NOTE — Addendum Note (Signed)
Addended by: Cresenciano Lick on: 03/27/2019 02:41 PM   Modules accepted: Orders

## 2019-03-27 NOTE — Assessment & Plan Note (Signed)
On Furosemide, Toprol

## 2019-03-27 NOTE — Assessment & Plan Note (Signed)
A1c

## 2019-03-27 NOTE — Progress Notes (Signed)
Subjective:  Patient ID: Marvin Morrison, male    DOB: Mar 11, 1940  Age: 79 y.o. MRN: [id number]  CC: No chief complaint on file.   HPI KERRON BUETOW presents for chronic diarrhea, gout, skin rash, HTN f/u C/o R foot pain - fell Outpatient Medications Prior to Visit  Medication Sig Dispense Refill  . acyclovir (ZOVIRAX) 400 MG tablet Take 1 tablet (400 mg total) by mouth 2 (two) times daily. 180 tablet 3  . colchicine 0.6 MG tablet Take 1 tablet (0.6 mg total) by mouth 2 (two) times daily as needed. 30 tablet 2  . furosemide (LASIX) 20 MG tablet Take 1-2 tablets (20-40 mg total) by mouth daily as needed. 60 tablet 3  . gabapentin (NEURONTIN) 100 MG capsule TAKE 1 CAPSULE BY MOUTH THREE TIMES A DAY 270 capsule 3  . lidocaine (LIDODERM) 5 % Place 1 patch onto the skin daily. Remove & Discard patch within 12 hours or as directed by MD 30 patch 0  . metoprolol succinate (TOPROL-XL) 50 MG 24 hr tablet TAKE 1 TABLET EVERY DAY. TAKE IMMEDIATELY FOLLOWING A MEAL 90 tablet 3  . Multiple Vitamin (MULTIVITAMIN WITH MINERALS) TABS tablet Take 1 tablet by mouth daily.    . ondansetron (ZOFRAN) 4 MG tablet Take 1 tablet (4 mg total) by mouth once for 1 dose. Annual appt due in [place] must see provider for future refills 30 tablet 2  . pantoprazole (PROTONIX) 40 MG tablet Take 1 tablet (40 mg total) by mouth daily. 90 tablet 0  . tamsulosin (FLOMAX) 0.4 MG CAPS capsule Take 1 capsule (0.4 mg total) by mouth daily. 90 capsule 3  . triamcinolone ointment (KENALOG) 0.1 % APPLY TO AFFECTED AREA TWICE A DAY 80 g 2  . vitamin C (VITAMIN C) 250 MG tablet Take 1 tablet (250 mg total) by mouth 3 (three) times daily with meals.     No facility-administered medications prior to visit.    ROS: Review of Systems  Constitutional: Negative for appetite change, fatigue and unexpected weight change.  HENT: Negative for congestion, nosebleeds, sneezing, sore throat and trouble swallowing.   Eyes: Negative for itching and  visual disturbance.  Respiratory: Negative for cough.   Cardiovascular: Negative for chest pain, palpitations and leg swelling.  Gastrointestinal: Positive for diarrhea. Negative for abdominal distention, blood in stool and nausea.  Genitourinary: Negative for frequency and hematuria.  Musculoskeletal: Positive for arthralgias, back pain and gait problem. Negative for joint swelling and neck pain.  Skin: Positive for color change and rash.  Neurological: Positive for weakness. Negative for dizziness, tremors and speech difficulty.  Psychiatric/Behavioral: Negative for agitation, dysphoric mood, sleep disturbance and suicidal ideas. The patient is not nervous/anxious.     Objective:  There were no vitals taken for this visit.  BP Readings from Last 3 Encounters:  06/27/18 132/84  09/06/17 122/80  04/24/17 130/72    Wt Readings from Last 3 Encounters:  09/06/17 183 lb (83 kg)  04/24/17 178 lb (80.7 kg)  03/20/17 185 lb (83.9 kg)    Physical Exam Constitutional:      General: He is not in acute distress.    Appearance: He is well-developed.     Comments: NAD  Eyes:     Conjunctiva/sclera: Conjunctivae normal.     Pupils: Pupils are equal, round, and reactive to light.  Neck:     Thyroid: No thyromegaly.     Vascular: No JVD.  Cardiovascular:     Rate and Rhythm: Normal  rate and regular rhythm.     Heart sounds: Normal heart sounds. No murmur. No friction rub. No gallop.   Pulmonary:     Effort: Pulmonary effort is normal. No respiratory distress.     Breath sounds: Normal breath sounds. No wheezing or rales.  Chest:     Chest wall: No tenderness.  Abdominal:     General: Bowel sounds are normal. There is no distension.     Palpations: Abdomen is soft. There is no mass.     Tenderness: There is no abdominal tenderness. There is no guarding or rebound.  Musculoskeletal:        General: No tenderness. Normal range of motion.     Cervical back: Normal range of motion.    Lymphadenopathy:     Cervical: No cervical adenopathy.  Skin:    General: Skin is warm and dry.     Findings: Erythema and rash present.  Neurological:     Mental Status: He is alert and oriented to person, place, and time.     Cranial Nerves: No cranial nerve deficit.     Motor: Weakness present. No abnormal muscle tone.     Coordination: Coordination abnormal.     Gait: Gait abnormal.     Deep Tendon Reflexes: Reflexes are normal and symmetric.  Psychiatric:        Behavior: Behavior normal.        Thought Content: Thought content normal.        Judgment: Judgment normal.   in a w/c  R midfoot and 2nd toe - swollen and tender w/one erosion  Lab Results  Component Value Date   WBC 10.0 06/27/2018   HGB 13.9 06/27/2018   HCT 40.7 06/27/2018   PLT 221.0 06/27/2018   GLUCOSE 124 (H) 06/27/2018   CHOL 269 (H) 09/06/2017   TRIG (H) 09/06/2017    432.0 Triglyceride is over 400; calculations on Lipids are invalid.   HDL 30.70 (L) 09/06/2017   LDLDIRECT 153.0 09/06/2017   LDLCALC 102 (H) 06/13/2013   ALT 20 06/27/2018   AST 16 06/27/2018   NA 134 (L) 06/27/2018   K 4.2 06/27/2018   CL 98 06/27/2018   CREATININE 1.53 (H) 06/27/2018   BUN 25 (H) 06/27/2018   CO2 29 06/27/2018   TSH 1.12 06/27/2018   PSA 0.89 09/06/2017   HGBA1C 5.9 03/20/2017    DG Chest 2 View  Result Date: 04/25/2017 CLINICAL DATA:  Cough and congestion for 3 days EXAM: CHEST - 2 VIEW COMPARISON:  01/13/2016 FINDINGS: Normal heart size. Lungs clear. No pneumothorax. No pleural effusion. IMPRESSION: No active cardiopulmonary disease. Electronically Signed   By: Marybelle Killings M.D.   On: 04/25/2017 08:04    Assessment & Plan:     Follow-up: No follow-ups on file.  Walker Kehr, MD

## 2019-03-27 NOTE — Assessment & Plan Note (Signed)
CBC

## 2019-03-27 NOTE — Addendum Note (Signed)
Addended by: Cresenciano Lick on: 03/27/2019 02:36 PM   Modules accepted: Orders

## 2019-03-28 LAB — VITAMIN D 25 HYDROXY (VIT D DEFICIENCY, FRACTURES): VITD: 112.71 ng/mL (ref 30.00–100.00)

## 2019-04-30 ENCOUNTER — Ambulatory Visit (INDEPENDENT_AMBULATORY_CARE_PROVIDER_SITE_OTHER): Payer: Medicare Other

## 2019-04-30 DIAGNOSIS — Z0000 Encounter for general adult medical examination without abnormal findings: Secondary | ICD-10-CM | POA: Diagnosis not present

## 2019-04-30 NOTE — Progress Notes (Addendum)
This visit is being conducted via phone call due to the COVID-19 pandemic. This patient has given me verbal consent via phone to conduct this visit, patient states they are participating from their home address. Some vital signs may be absent or patient reported.   Patient identification: identified by name, DOB, and current address.  Location provider: [facility], Office Persons participating in the virtual visit: Lew Dawes, MD, Lisette Abu, LPN, patient and wife.    Subjective:   Marvin Morrison is a 79 y.o. male who presents for Medicare Annual/Subsequent preventive examination.  Review of Systems:  No Ros Medicare Wellness Visit Cardiac Risk Factors include: advanced age (>25men, >71 women);dyslipidemia;hypertension;male gender  Sleep Patterns: No issues with falling sleep; feels rested on waking after 8 hours of sleep nightly. Home Safety/Smoke Alarms: Feels safe in home; Smoke alarms in place. Living environment: Patient is home bound; Lives in a 2-story home with his wife and dog.  Has one daughter.      Objective:    Vitals: There were no vitals taken for this visit.  There is no height or weight on file to calculate BMI.  Advanced Directives 02/11/2016 01/13/2016 01/11/2016  Does Patient Have a Medical Advance Directive? No No No  Would patient like information on creating a medical advance directive? - No - Patient declined Yes (Inpatient - patient requests chaplain consult to create a medical advance directive)    Tobacco Social History   Tobacco Use  Smoking Status Never Smoker  Smokeless Tobacco Never Used     Counseling given: Not Answered   Clinical Intake:  Pre-visit preparation completed: Yes  Pain : 0-10 Pain Score: 4  Pain Type: Other (Comment)(broken toe on right foot due to a fall) Pain Location: Toe (Comment which one) Pain Orientation: Right Pain Descriptors / Indicators: Aching Pain Onset: Other (comment) Pain Frequency: Rarely     Nutritional Risks: None Diabetes: No  How often do you need to have someone help you when you read instructions, pamphlets, or other written materials from your doctor or pharmacy?: 1 - Never  Interpreter Needed?: No  Information entered by :: Benjaman Artman N. Cline Draheim, LPN  Past Medical History:  Diagnosis Date   Arthritis    Chronic kidney disease    Colitis    Diarrhea    Eczema    Elevated glucose    HTN (hypertension)    Hyperlipidemia    Pneumonia    Shingles 2009   Past Surgical History:  Procedure Laterality Date   LUMBAR LAMINECTOMY/DECOMPRESSION MICRODISCECTOMY N/A 01/11/2016   Procedure: Lumbar two- three, Lumbar three- four, Lumbar four- five, Lumbar five-Sacral one Laminectomy;  Surgeon: Kristeen Miss, MD;  Location: [facility];  Service: Neurosurgery;  Laterality: N/A;  L2-3 L3-4 L4-5 L5-S1 Laminectomy   TONSILLECTOMY     Age 89   Family History  Problem Relation Age of Onset   Hypertension Mother    Hyperlipidemia Father    Hypertension Father    Social History   Socioeconomic History   Marital status: Married    Spouse name: Not on file   Number of children: Not on file   Years of education: Not on file   Highest education level: Not on file  Occupational History   Occupation: Customer service manager  Tobacco Use   Smoking status: Never Smoker   Smokeless tobacco: Never Used  Substance and Sexual Activity   Alcohol use: No   Drug use: No   Sexual activity: Yes  Other Topics Concern   Not on file  Social History Narrative   Not on file   Social Determinants of Health   Financial Resource Strain:    Difficulty of Paying Living Expenses:   Food Insecurity:    Worried About Charity fundraiser in the Last Year:    Arboriculturist in the Last Year:   Transportation Needs:    Film/video editor (Medical):    Lack of Transportation (Non-Medical):   Physical Activity:    Days of Exercise per Week:    Minutes of Exercise per Session:   Stress:     Feeling of Stress :   Social Connections:    Frequency of Communication with Friends and Family:    Frequency of Social Gatherings with Friends and Family:    Attends Religious Services:    Active Member of Clubs or Organizations:    Attends Archivist Meetings:    Marital Status:     Outpatient Encounter Medications as of 04/30/2019  Medication Sig   acyclovir (ZOVIRAX) 400 MG tablet Take 1 tablet (400 mg total) by mouth 2 (two) times daily.   gabapentin (NEURONTIN) 100 MG capsule TAKE 1 CAPSULE BY MOUTH THREE TIMES A DAY   metoprolol succinate (TOPROL-XL) 50 MG 24 hr tablet TAKE 1 TABLET EVERY DAY. TAKE IMMEDIATELY FOLLOWING A MEAL   Multiple Vitamin (MULTIVITAMIN WITH MINERALS) TABS tablet Take 1 tablet by mouth daily.   mupirocin ointment (BACTROBAN) 2 % On leg wound w/dressing change qd or bid   pantoprazole (PROTONIX) 40 MG tablet Take 1 tablet (40 mg total) by mouth daily.   tamsulosin (FLOMAX) 0.4 MG CAPS capsule Take 1 capsule (0.4 mg total) by mouth daily.   triamcinolone ointment (KENALOG) 0.1 % APPLY TO AFFECTED AREA TWICE A DAY   No facility-administered encounter medications on file as of 04/30/2019.    Activities of Daily Living In your present state of health, do you have any difficulty performing the following activities: 04/30/2019 03/27/2019  Hearing? N N  Vision? N N  Difficulty concentrating or making decisions? N N  Walking or climbing stairs? Y Y  Dressing or bathing? Y Y  Doing errands, shopping? Tempie Donning  Preparing Food and eating ? N -  Using the Toilet? Y -  In the past six months, have you accidently leaked urine? Y -  Do you have problems with loss of bowel control? N -  Managing your Medications? N -  Managing your Finances? Y -  Housekeeping or managing your Housekeeping? Y -  Some recent data might be hidden    Patient Care Team: Plotnikov, Evie Lacks, MD as PCP - General   Assessment:   This is a routine wellness examination for  Marvin Morrison.  Exercise Activities and Dietary recommendations Current Exercise Habits: The patient does not participate in regular exercise at present, Exercise limited by: neurologic condition(s);orthopedic condition(s)  Goals      Client understands the importance of follow-up with providers by attending scheduled visits     Patient Stated     Thankful that I'm still alert and I'm still breathing.        Fall Risk Fall Risk  04/30/2019 03/27/2019 11/26/2018 08/02/2018 03/20/2017  Falls in the past year? 1 1 0 (No Data) Yes  Comment - - Emmi Telephone Survey: data to providers prior to load Emmi Telephone Survey: data to providers prior to load -  Number falls in past yr: 1 1 - (No  Data) 2 or more  Comment - - - Emmi Telephone Survey Actual Response =  -  Injury with Fall? 1 1 - - Yes  Risk for fall due to : Impaired balance/gait;Impaired mobility;Orthopedic patient - - - -  Follow up Falls evaluation completed;Education provided;Falls prevention discussed - - - -   Is the patient's home free of loose throw rugs in walkways, pet beds, electrical cords, etc?   yes      Grab bars in the bathroom? yes      Handrails on the stairs?   yes      Adequate lighting?   yes  Depression Screen PHQ 2/9 Scores 04/30/2019 03/27/2019 03/20/2017 10/28/2015  PHQ - 2 Score 0 0 0 0          There is no immunization history for the selected administration types on file for this patient.  Qualifies for Shingles Vaccine?  declined  Screening Tests Health Maintenance  Topic Date Due   COVID-19 Vaccine (1) Never done   TETANUS/TDAP  Never done   PNA vac Low Risk Adult (1 of 2 - PCV13) Never done   INFLUENZA VACCINE  08/04/2019   Cancer Screenings: Lung: Low Dose CT Chest recommended if Age 27-80 years, 30 pack-year currently smoking OR have quit w/in 15years. Patient does not qualify. Colorectal: declined       Plan:      Reviewed health maintenance screenings with patient today and relevant  education, vaccines, and/or referrals were provided.    Continue doing brain stimulating activities (puzzles, reading, adult coloring books, staying active) to keep memory sharp.    Continue to eat heart healthy diet (full of fruits, vegetables, whole grains, lean protein, water--limit salt, fat, and sugar intake) and increase physical activity as tolerated.  I have personally reviewed and noted the following in the patient's chart:   Medical and social history Use of alcohol, tobacco or illicit drugs  Current medications and supplements Functional ability and status Nutritional status Physical activity Advanced directives List of other physicians Hospitalizations, surgeries, and ER visits in previous 12 months Vitals Screenings to include cognitive, depression, and falls Referrals and appointments  In addition, I have reviewed and discussed with patient certain preventive protocols, quality metrics, and best practice recommendations. A written personalized care plan for preventive services as well as general preventive health recommendations were provided to patient.     Sheral Flow, LPN  [id number]  Nurse Health Advisor  Nurse Notes: No vital signs were taken. After Visit Summary was mailed to patient's home address on file.  Medical screening examination/treatment/procedure(s) were performed by non-physician practitioner and as supervising physician I was immediately available for consultation/collaboration.  I agree with above. Lew Dawes, MD

## 2019-04-30 NOTE — Patient Instructions (Signed)
[name] , Thank you for taking time to come for your Medicare Wellness Visit. I appreciate your ongoing commitment to your health goals. Please review the following plan we discussed and let me know if I can assist you in the future.   Screening recommendations/referrals: Colorectal Screening: declined  Vision and Dental Exams: Recommended annual ophthalmology exams for early detection of glaucoma and other disorders of the eye Recommended annual dental exams for proper oral hygiene  Vaccinations: Influenza vaccine: declined Pneumococcal vaccine: declined Tdap vaccine: declined Shingles vaccine: declined Covid vaccine: declined  Advanced directives: Advance directives discussed with you today. Please bring a copy of your POA (Power of [place]) and/or Living Will to your next appointment.  Goals:  Recommend to drink at least 6-8 8oz glasses of water per day.  Recommend to exercise for at least 150 minutes per week.  Recommend to remove any items from the home that may cause slips or trips.  Recommend to decrease portion sizes by eating 3 small healthy meals and at least 2 healthy snacks per day.  Recommend to begin DASH diet as directed below  Recommend to continue efforts to reduce smoking habits until no longer smoking. Smoking Cessation literature is attached below.  Next appointment: Please schedule your Annual Wellness Visit with your Nurse Health Advisor in one year.  Preventive Care 48 Years and Older, Male Preventive care refers to lifestyle choices and visits with your health care provider that can promote health and wellness. What does preventive care include?  A yearly physical exam. This is also called an annual well check.  Dental exams once or twice a year.  Routine eye exams. Ask your health care provider how often you should have your eyes checked.  Personal lifestyle choices, including:  Daily care of your teeth and gums.  Regular physical  activity.  Eating a healthy diet.  Avoiding tobacco and drug use.  Limiting alcohol use.  Practicing safe sex.  Taking low doses of aspirin every day if recommended by your health care provider..  Taking vitamin and mineral supplements as recommended by your health care provider. What happens during an annual well check? The services and screenings done by your health care provider during your annual well check will depend on your age, overall health, lifestyle risk factors, and family history of disease. Counseling  Your health care provider may ask you questions about your:  Alcohol use.  Tobacco use.  Drug use.  Emotional well-being.  Home and relationship well-being.  Sexual activity.  Eating habits.  History of falls.  Memory and ability to understand (cognition).  Work and work Statistician. Screening  You may have the following tests or measurements:  Height, weight, and BMI.  Blood pressure.  Lipid and cholesterol levels. These may be checked every 5 years, or more frequently if you are over 45 years old.  Skin check.  Lung cancer screening. You may have this screening every year starting at age 58 if you have a 30-pack-year history of smoking and currently smoke or have quit within the past 15 years.  Fecal occult blood test (FOBT) of the stool. You may have this test every year starting at age 64.  Flexible sigmoidoscopy or colonoscopy. You may have a sigmoidoscopy every 5 years or a colonoscopy every 10 years starting at age 78.  Prostate cancer screening. Recommendations will vary depending on your family history and other risks.  Hepatitis C blood test.  Hepatitis B blood test.  Sexually transmitted disease (STD)  testing.  Diabetes screening. This is done by checking your blood sugar (glucose) after you have not eaten for a while (fasting). You may have this done every 1-3 years.  Abdominal aortic aneurysm (AAA) screening. You may need this  if you are a current or former smoker.  Osteoporosis. You may be screened starting at age 72 if you are at high risk. Talk with your health care provider about your test results, treatment options, and if necessary, the need for more tests. Vaccines  Your health care provider may recommend certain vaccines, such as:  Influenza vaccine. This is recommended every year.  Tetanus, diphtheria, and acellular pertussis (Tdap, Td) vaccine. You may need a Td booster every 10 years.  Zoster vaccine. You may need this after age 25.  Pneumococcal 13-valent conjugate (PCV13) vaccine. One dose is recommended after age 36.  Pneumococcal polysaccharide (PPSV23) vaccine. One dose is recommended after age 35. Talk to your health care provider about which screenings and vaccines you need and how often you need them. This information is not intended to replace advice given to you by your health care provider. Make sure you discuss any questions you have with your health care provider. Document Released: 01/16/2015 Document Revised: 09/09/2015 Document Reviewed: 10/21/2014 Elsevier Interactive Patient Education  2017 [employer] Prevention in the Home Falls can cause injuries. They can happen to people of all ages. There are many things you can do to make your home safe and to help prevent falls. What can I do on the outside of my home?  Regularly fix the edges of walkways and driveways and fix any cracks.  Remove anything that might make you trip as you walk through a door, such as a raised step or threshold.  Trim any bushes or trees on the path to your home.  Use bright outdoor lighting.  Clear any walking paths of anything that might make someone trip, such as rocks or tools.  Regularly check to see if handrails are loose or broken. Make sure that both sides of any steps have handrails.  Any raised decks and porches should have guardrails on the edges.  Have any leaves, snow, or ice  cleared regularly.  Use sand or salt on walking paths during winter.  Clean up any spills in your garage right away. This includes oil or grease spills. What can I do in the bathroom?  Use night lights.  Install grab bars by the toilet and in the tub and shower. Do not use towel bars as grab bars.  Use non-skid mats or decals in the tub or shower.  If you need to sit down in the shower, use a plastic, non-slip stool.  Keep the floor dry. Clean up any water that spills on the floor as soon as it happens.  Remove soap buildup in the tub or shower regularly.  Attach bath mats securely with double-sided non-slip rug tape.  Do not have throw rugs and other things on the floor that can make you trip. What can I do in the bedroom?  Use night lights.  Make sure that you have a light by your bed that is easy to reach.  Do not use any sheets or blankets that are too big for your bed. They should not hang down onto the floor.  Have a firm chair that has side arms. You can use this for support while you get dressed.  Do not have throw rugs and other things on the floor  that can make you trip. What can I do in the kitchen?  Clean up any spills right away.  Avoid walking on wet floors.  Keep items that you use a lot in easy-to-reach places.  If you need to reach something above you, use a strong step stool that has a grab bar.  Keep electrical cords out of the way.  Do not use floor polish or wax that makes floors slippery. If you must use wax, use non-skid floor wax.  Do not have throw rugs and other things on the floor that can make you trip. What can I do with my stairs?  Do not leave any items on the stairs.  Make sure that there are handrails on both sides of the stairs and use them. Fix handrails that are broken or loose. Make sure that handrails are as long as the stairways.  Check any carpeting to make sure that it is firmly attached to the stairs. Fix any carpet that  is loose or worn.  Avoid having throw rugs at the top or bottom of the stairs. If you do have throw rugs, attach them to the floor with carpet tape.  Make sure that you have a light switch at the top of the stairs and the bottom of the stairs. If you do not have them, ask someone to add them for you. What else can I do to help prevent falls?  Wear shoes that:  Do not have high heels.  Have rubber bottoms.  Are comfortable and fit you well.  Are closed at the toe. Do not wear sandals.  If you use a stepladder:  Make sure that it is fully opened. Do not climb a closed stepladder.  Make sure that both sides of the stepladder are locked into place.  Ask someone to hold it for you, if possible.  Clearly mark and make sure that you can see:  Any grab bars or handrails.  First and last steps.  Where the edge of each step is.  Use tools that help you move around (mobility aids) if they are needed. These include:  Canes.  Walkers.  Scooters.  Crutches.  Turn on the lights when you go into a dark area. Replace any light bulbs as soon as they burn out.  Set up your furniture so you have a clear path. Avoid moving your furniture around.  If any of your floors are uneven, fix them.  If there are any pets around you, be aware of where they are.  Review your medicines with your doctor. Some medicines can make you feel dizzy. This can increase your chance of falling. Ask your doctor what other things that you can do to help prevent falls. This information is not intended to replace advice given to you by your health care provider. Make sure you discuss any questions you have with your health care provider. Document Released: 10/16/2008 Document Revised: 05/28/2015 Document Reviewed: 01/24/2014 Elsevier Interactive Patient Education  2017 [employer].

## 2019-05-30 ENCOUNTER — Telehealth: Payer: Self-pay | Admitting: Internal Medicine

## 2019-05-30 MED ORDER — ONDANSETRON HCL 4 MG PO TABS: 4.0000 mg | ORAL_TABLET | Freq: Every day | ORAL | 1 refills | Status: DC | PRN

## 2019-05-30 NOTE — Telephone Encounter (Signed)
Refill sent. Pt informed.

## 2019-05-30 NOTE — Telephone Encounter (Signed)
New Message:   Pt is calling to inquire about a message that was on his prescription bottles when he picked his medication. Pt states it says to make an appt in June for more medication refills. Pt states he is not due to come back until September. Please advise.

## 2019-05-30 NOTE — Telephone Encounter (Signed)
Follow up message    1. Name of Medication: ondansetron (ZOFRAN) 4 MG tablet [id number]  2. How are you currently taking this medication (dosage and times per day)? n/a  3. Are you having a reaction (difficulty breathing--STAT)? No  4. What is your medication issue? Patient is concerned that he will not be able to get ondansetron [facility]) .

## 2019-08-28 ENCOUNTER — Other Ambulatory Visit: Payer: Self-pay | Admitting: Internal Medicine

## 2019-08-30 ENCOUNTER — Telehealth: Payer: Self-pay | Admitting: Internal Medicine

## 2019-08-30 NOTE — Telephone Encounter (Signed)
ondansetron (ZOFRAN) 4 MG tablet  [id number] - [place], [place] - [address] AT [address] Phone:  [phone]  Fax:  [phone]     Last appt: 3.24.21  Next appt:  9.15.21

## 2019-09-03 NOTE — Telephone Encounter (Signed)
Medication was sent in on 09/01/19

## 2019-09-18 ENCOUNTER — Ambulatory Visit (INDEPENDENT_AMBULATORY_CARE_PROVIDER_SITE_OTHER): Payer: Medicare Other

## 2019-09-18 ENCOUNTER — Other Ambulatory Visit: Payer: Self-pay

## 2019-09-18 ENCOUNTER — Encounter: Payer: Self-pay | Admitting: Internal Medicine

## 2019-09-18 ENCOUNTER — Ambulatory Visit (INDEPENDENT_AMBULATORY_CARE_PROVIDER_SITE_OTHER): Payer: Medicare Other | Admitting: Internal Medicine

## 2019-09-18 VITALS — BP 138/66 | HR 85 | Temp 98.3°F | Ht 72.0 in | Wt 185.0 lb

## 2019-09-18 DIAGNOSIS — L97512 Non-pressure chronic ulcer of other part of right foot with fat layer exposed: Secondary | ICD-10-CM

## 2019-09-18 DIAGNOSIS — R112 Nausea with vomiting, unspecified: Secondary | ICD-10-CM

## 2019-09-18 DIAGNOSIS — L97511 Non-pressure chronic ulcer of other part of right foot limited to breakdown of skin: Secondary | ICD-10-CM | POA: Diagnosis not present

## 2019-09-18 DIAGNOSIS — I10 Essential (primary) hypertension: Secondary | ICD-10-CM

## 2019-09-18 DIAGNOSIS — R531 Weakness: Secondary | ICD-10-CM

## 2019-09-18 DIAGNOSIS — E538 Deficiency of other specified B group vitamins: Secondary | ICD-10-CM | POA: Diagnosis not present

## 2019-09-18 DIAGNOSIS — E559 Vitamin D deficiency, unspecified: Secondary | ICD-10-CM | POA: Diagnosis not present

## 2019-09-18 DIAGNOSIS — Z789 Other specified health status: Secondary | ICD-10-CM | POA: Diagnosis not present

## 2019-09-18 DIAGNOSIS — D500 Iron deficiency anemia secondary to blood loss (chronic): Secondary | ICD-10-CM | POA: Diagnosis not present

## 2019-09-18 DIAGNOSIS — N1832 Chronic kidney disease, stage 3b: Secondary | ICD-10-CM

## 2019-09-18 MED ORDER — ONDANSETRON HCL 4 MG PO TABS
ORAL_TABLET | ORAL | 3 refills | Status: DC
Start: 2019-09-18 — End: 2020-02-28

## 2019-09-18 MED ORDER — DOXYCYCLINE HYCLATE 100 MG PO TABS: 100.0000 mg | ORAL_TABLET | Freq: Two times a day (BID) | ORAL | 0 refills | Status: DC

## 2019-09-18 MED ORDER — MUPIROCIN 2 % EX OINT: TOPICAL_OINTMENT | CUTANEOUS | 1 refills | Status: DC

## 2019-09-18 NOTE — Assessment & Plan Note (Signed)
BP Readings from Last 3 Encounters:  09/18/19 138/66  03/27/19 (!) 144/86  06/27/18 132/84

## 2019-09-18 NOTE — Assessment & Plan Note (Signed)
CBC

## 2019-09-18 NOTE — Progress Notes (Signed)
Subjective:  Patient ID: Marvin Morrison, male    DOB: [birth year]  Age: 79 y.o. MRN: [id number]  CC: No chief complaint on file.   HPI Marvin Morrison presents for R leg ulcers, pain, redness for ?time  F/u chronic nausea, rash: diarrhea resolved  Advised to get COVID 19 vaccine ASAP Satya declined [place] ref        Outpatient Medications Prior to Visit  Medication Sig Dispense Refill  . acyclovir (ZOVIRAX) 400 MG tablet Take 1 tablet (400 mg total) by mouth 2 (two) times daily. 180 tablet 3  . gabapentin (NEURONTIN) 100 MG capsule TAKE 1 CAPSULE BY MOUTH THREE TIMES A DAY 270 capsule 3  . metoprolol succinate (TOPROL-XL) 50 MG 24 hr tablet TAKE 1 TABLET EVERY DAY. TAKE IMMEDIATELY FOLLOWING A MEAL 90 tablet 3  . Multiple Vitamin (MULTIVITAMIN WITH MINERALS) TABS tablet Take 1 tablet by mouth daily.    . mupirocin ointment (BACTROBAN) 2 % On leg wound w/dressing change qd or bid 30 g 0  . ondansetron (ZOFRAN) 4 MG tablet TAKE 1 TABLET BY MOUTH DAILY AS NEEDED FOR NAUSEA OR VOMITING. 30 tablet 1  . pantoprazole (PROTONIX) 40 MG tablet Take 1 tablet (40 mg total) by mouth daily. 90 tablet 3  . tamsulosin (FLOMAX) 0.4 MG CAPS capsule Take 1 capsule (0.4 mg total) by mouth daily. 90 capsule 3  . triamcinolone ointment (KENALOG) 0.1 % APPLY TO AFFECTED AREA TWICE A DAY 240 g 2   No facility-administered medications prior to visit.    ROS: Review of Systems  Constitutional: Positive for fatigue. Negative for appetite change and unexpected weight change.  HENT: Negative for congestion, nosebleeds, sneezing, sore throat and trouble swallowing.   Eyes: Negative for itching and visual disturbance.  Respiratory: Negative for cough.   Cardiovascular: Negative for chest pain, palpitations and leg swelling.  Gastrointestinal: Negative for abdominal distention, blood in stool, diarrhea and nausea.  Genitourinary: Negative for frequency and hematuria.  Musculoskeletal: Positive for back  pain and gait problem. Negative for joint swelling and neck pain.  Skin: Positive for color change, rash and wound.  Neurological: Positive for weakness. Negative for dizziness, tremors and speech difficulty.  Psychiatric/Behavioral: Negative for agitation, dysphoric mood and sleep disturbance. The patient is nervous/anxious.     Objective:  BP 138/66 (BP Location: Left Arm, Patient Position: Sitting, Cuff Size: Large)   Pulse 85   Temp 98.3 F (36.8 C) (Oral)   Ht 6' (1.829 m)   Wt 185 lb (83.9 kg)   SpO2 97%   BMI 25.09 kg/m   BP Readings from Last 3 Encounters:  09/18/19 138/66  03/27/19 (!) 144/86  06/27/18 132/84    Wt Readings from Last 3 Encounters:  09/18/19 185 lb (83.9 kg)  03/27/19 175 lb (79.4 kg)  09/06/17 183 lb (83 kg)    Physical Exam Constitutional:      General: He is not in acute distress.    Appearance: He is well-developed.     Comments: NAD  Eyes:     Conjunctiva/sclera: Conjunctivae normal.     Pupils: Pupils are equal, round, and reactive to light.  Neck:     Thyroid: No thyromegaly.     Vascular: No JVD.  Cardiovascular:     Rate and Rhythm: Normal rate and regular rhythm.     Heart sounds: Normal heart sounds. No murmur heard.  No friction rub. No gallop.   Pulmonary:     Effort: Pulmonary effort  is normal. No respiratory distress.     Breath sounds: Normal breath sounds. No wheezing or rales.  Chest:     Chest wall: No tenderness.  Abdominal:     General: Bowel sounds are normal. There is no distension.     Palpations: Abdomen is soft. There is no mass.     Tenderness: There is no abdominal tenderness. There is no guarding or rebound.  Musculoskeletal:        General: Swelling and tenderness present. Normal range of motion.     Cervical back: Normal range of motion.     Right lower leg: Edema present.     Left lower leg: No edema.  Lymphadenopathy:     Cervical: No cervical adenopathy.  Skin:    General: Skin is warm and dry.      Findings: No rash.  Neurological:     Mental Status: He is alert and oriented to person, place, and time.     Cranial Nerves: No cranial nerve deficit.     Motor: No abnormal muscle tone.     Coordination: Coordination abnormal.     Gait: Gait abnormal.     Deep Tendon Reflexes: Reflexes are normal and symmetric.  Psychiatric:        Behavior: Behavior normal.        Thought Content: Thought content normal.        Judgment: Judgment normal.   in a w/c RLE w/ulcers, swelling, rash   Time >45 min: time was spent on coordination of care, reviewing past records, lab results and X rays; educating the patient or family. No CPR per his wishes    Lab Results  Component Value Date   WBC 10.1 03/27/2019   HGB 13.2 03/27/2019   HCT 38.5 (L) 03/27/2019   PLT 214.0 03/27/2019   GLUCOSE 124 (H) 06/27/2018   CHOL 238 (H) 03/27/2019   TRIG 304.0 (H) 03/27/2019   HDL 29.40 (L) 03/27/2019   LDLDIRECT 141.0 03/27/2019   LDLCALC 102 (H) 06/13/2013   ALT 17 03/27/2019   AST 16 03/27/2019   NA 134 (L) 06/27/2018   K 4.2 06/27/2018   CL 98 06/27/2018   CREATININE 1.53 (H) 06/27/2018   BUN 25 (H) 06/27/2018   CO2 29 06/27/2018   TSH 1.23 03/27/2019   PSA 0.69 03/27/2019   HGBA1C 5.6 03/27/2019    DG Chest 2 View  Result Date: 04/25/2017 CLINICAL DATA:  Cough and congestion for 3 days EXAM: CHEST - 2 VIEW COMPARISON:  01/13/2016 FINDINGS: Normal heart size. Lungs clear. No pneumothorax. No pleural effusion. IMPRESSION: No active cardiopulmonary disease. Electronically Signed   By: Marybelle Killings M.D.   On: 04/25/2017 08:04    Assessment & Plan:    Walker Kehr, MD

## 2019-09-18 NOTE — Assessment & Plan Note (Signed)
Wound Clinic ref Arterial Doppler Doxy 4 wks Bactroban X ray

## 2019-09-18 NOTE — Assessment & Plan Note (Signed)
CMET 

## 2019-09-18 NOTE — Assessment & Plan Note (Signed)
Labs In a w/c

## 2019-09-18 NOTE — Assessment & Plan Note (Signed)
Discussed - form filled

## 2019-09-18 NOTE — Assessment & Plan Note (Signed)
Chronic Zofran prn

## 2019-09-18 NOTE — Patient Instructions (Addendum)
    Wound instructions : change TELFA dressing once a day or twice a day is needed. Change dressing after  shower in the morning.  Pat dry the wound with gauze.  Re-dress wound with antibiotic ointment and Telfa pad    [place] started vaccine booster sign up. Please call [place] [facility] at [phone]. You can also call the venue where you had your initial COVID 19 vaccination.

## 2019-09-19 LAB — VITAMIN D 25 HYDROXY (VIT D DEFICIENCY, FRACTURES): Vit D, 25-Hydroxy: 101 ng/mL — ABNORMAL HIGH (ref 30–100)

## 2019-09-19 LAB — TSH: TSH: 1.55 mIU/L (ref 0.40–4.50)

## 2019-09-19 LAB — CBC WITH DIFFERENTIAL/PLATELET
Absolute Monocytes: 818 cells/uL (ref 200–950)
Basophils Absolute: 55 cells/uL (ref 0–200)
Basophils Relative: 0.5 %
Eosinophils Absolute: 164 cells/uL (ref 15–500)
Eosinophils Relative: 1.5 %
HCT: 33.6 % — ABNORMAL LOW (ref 38.5–50.0)
Hemoglobin: 11.7 g/dL — ABNORMAL LOW (ref 13.2–17.1)
Lymphs Abs: 1515 cells/uL (ref 850–3900)
MCH: 34 pg — ABNORMAL HIGH (ref 27.0–33.0)
MCHC: 34.8 g/dL (ref 32.0–36.0)
MCV: 97.7 fL (ref 80.0–100.0)
MPV: 10.8 fL (ref 7.5–12.5)
Monocytes Relative: 7.5 %
Neutro Abs: 8349 cells/uL — ABNORMAL HIGH (ref 1500–7800)
Neutrophils Relative %: 76.6 %
Platelets: 317 10*3/uL (ref 140–400)
RBC: 3.44 10*6/uL — ABNORMAL LOW (ref 4.20–5.80)
RDW: 12.4 % (ref 11.0–15.0)
Total Lymphocyte: 13.9 %
WBC: 10.9 10*3/uL — ABNORMAL HIGH (ref 3.8–10.8)

## 2019-09-19 LAB — COMPLETE METABOLIC PANEL WITH GFR
AG Ratio: 1.8 (calc) (ref 1.0–2.5)
ALT: 18 U/L (ref 9–46)
AST: 16 U/L (ref 10–35)
Albumin: 3.9 g/dL (ref 3.6–5.1)
Alkaline phosphatase (APISO): 78 U/L (ref 35–144)
BUN/Creatinine Ratio: 18 (calc) (ref 6–22)
BUN: 28 mg/dL — ABNORMAL HIGH (ref 7–25)
CO2: 22 mmol/L (ref 20–32)
Calcium: 8.9 mg/dL (ref 8.6–10.3)
Chloride: 101 mmol/L (ref 98–110)
Creat: 1.57 mg/dL — ABNORMAL HIGH (ref 0.70–1.11)
GFR, Est African American: 47 mL/min/{1.73_m2} — ABNORMAL LOW (ref >=60)
GFR, Est Non African American: 41 mL/min/{1.73_m2} — ABNORMAL LOW (ref >=60)
Globulin: 2.2 g/dL (calc) (ref 1.9–3.7)
Glucose, Bld: 128 mg/dL — ABNORMAL HIGH (ref 65–99)
Potassium: 4 mmol/L (ref 3.5–5.3)
Sodium: 136 mmol/L (ref 135–146)
Total Bilirubin: 0.4 mg/dL (ref 0.2–1.2)
Total Protein: 6.1 g/dL (ref 6.1–8.1)

## 2019-09-19 LAB — VITAMIN B12: Vitamin B-12: >2000 pg/mL — ABNORMAL HIGH (ref 200–1100)

## 2019-09-19 LAB — T4, FREE: Free T4: 1 ng/dL (ref 0.8–1.8)

## 2019-09-19 LAB — URINALYSIS

## 2019-09-19 LAB — SEDIMENTATION RATE: Sed Rate: 65 mm/h — ABNORMAL HIGH (ref 0–20)

## 2019-09-22 ENCOUNTER — Other Ambulatory Visit: Payer: Self-pay | Admitting: Internal Medicine

## 2019-10-08 ENCOUNTER — Other Ambulatory Visit: Payer: Self-pay | Admitting: Internal Medicine

## 2019-10-11 ENCOUNTER — Other Ambulatory Visit: Payer: Self-pay | Admitting: Internal Medicine

## 2019-10-11 NOTE — Telephone Encounter (Signed)
This med was only intended for 4 wks per Dr Alain Marion by his note

## 2019-10-24 ENCOUNTER — Encounter: Payer: Self-pay | Admitting: Internal Medicine

## 2019-10-24 ENCOUNTER — Other Ambulatory Visit: Payer: Self-pay

## 2019-10-24 ENCOUNTER — Ambulatory Visit (INDEPENDENT_AMBULATORY_CARE_PROVIDER_SITE_OTHER): Payer: Medicare Other | Admitting: Internal Medicine

## 2019-10-24 DIAGNOSIS — L97512 Non-pressure chronic ulcer of other part of right foot with fat layer exposed: Secondary | ICD-10-CM | POA: Diagnosis not present

## 2019-10-24 DIAGNOSIS — Z91199 Patient's noncompliance with other medical treatment and regimen due to unspecified reason: Secondary | ICD-10-CM

## 2019-10-24 DIAGNOSIS — Z9119 Patient's noncompliance with other medical treatment and regimen: Secondary | ICD-10-CM | POA: Diagnosis not present

## 2019-10-24 DIAGNOSIS — R269 Unspecified abnormalities of gait and mobility: Secondary | ICD-10-CM | POA: Diagnosis not present

## 2019-10-24 DIAGNOSIS — R634 Abnormal weight loss: Secondary | ICD-10-CM | POA: Diagnosis not present

## 2019-10-24 DIAGNOSIS — N1832 Chronic kidney disease, stage 3b: Secondary | ICD-10-CM | POA: Diagnosis not present

## 2019-10-24 DIAGNOSIS — E440 Moderate protein-calorie malnutrition: Secondary | ICD-10-CM

## 2019-10-24 MED ORDER — FUROSEMIDE 40 MG PO TABS: 40.0000 mg | ORAL_TABLET | Freq: Every day | ORAL | 3 refills | Status: DC | PRN

## 2019-10-24 MED ORDER — DOXYCYCLINE HYCLATE 100 MG PO TABS
100.0000 mg | ORAL_TABLET | Freq: Two times a day (BID) | ORAL | 0 refills | Status: DC
Start: 2019-10-24 — End: 2019-12-11

## 2019-10-24 MED ORDER — MUPIROCIN 2 % EX OINT
TOPICAL_OINTMENT | CUTANEOUS | 1 refills | Status: DC
Start: 2019-10-24 — End: 2019-12-11

## 2019-10-24 NOTE — Progress Notes (Signed)
Subjective:  Patient ID: Marvin Morrison, male    DOB: 09-19-1940  Age: 79 y.o. MRN: [id number]  CC: Follow-up   HPI Marvin Morrison presents for wounds on the R foot, edema, scaly skin. Some better. Taking 15 different vitamins or supplements  Outpatient Medications Prior to Visit  Medication Sig Dispense Refill  . acyclovir (ZOVIRAX) 400 MG tablet Take 1 tablet (400 mg total) by mouth 2 (two) times daily. 180 tablet 3  . doxycycline (VIBRA-TABS) 100 MG tablet Take 1 tablet (100 mg total) by mouth 2 (two) times daily. 60 tablet 0  . gabapentin (NEURONTIN) 100 MG capsule TAKE 1 CAPSULE BY MOUTH THREE TIMES A DAY 270 capsule 3  . metoprolol succinate (TOPROL-XL) 50 MG 24 hr tablet TAKE 1 TABLET EVERY DAY. TAKE IMMEDIATELY FOLLOWING A MEAL 90 tablet 3  . Multiple Vitamin (MULTIVITAMIN WITH MINERALS) TABS tablet Take 1 tablet by mouth daily.    . mupirocin ointment (BACTROBAN) 2 % APPLY TO LEG WOUND WITH DRESSING CHANGE DAILY OR TWICE A DAY 22 g 1  . ondansetron (ZOFRAN) 4 MG tablet TAKE 1 TABLET BY MOUTH DAILY AS NEEDED FOR NAUSEA OR VOMITING. 30 tablet 3  . pantoprazole (PROTONIX) 40 MG tablet Take 1 tablet (40 mg total) by mouth daily. 90 tablet 3  . tamsulosin (FLOMAX) 0.4 MG CAPS capsule Take 1 capsule (0.4 mg total) by mouth daily. 90 capsule 3  . triamcinolone ointment (KENALOG) 0.1 % APPLY TO AFFECTED AREA TWICE A DAY 240 g 2   No facility-administered medications prior to visit.    ROS: Review of Systems  Constitutional: Negative for appetite change, fatigue and unexpected weight change.  HENT: Negative for congestion, nosebleeds, sneezing, sore throat and trouble swallowing.   Eyes: Negative for itching and visual disturbance.  Respiratory: Negative for cough.   Cardiovascular: Positive for leg swelling. Negative for chest pain and palpitations.  Gastrointestinal: Negative for abdominal distention, blood in stool, diarrhea and nausea.  Genitourinary: Negative for frequency  and hematuria.  Musculoskeletal: Positive for gait problem. Negative for back pain, joint swelling and neck pain.  Skin: Positive for color change and wound. Negative for rash.  Neurological: Negative for dizziness, tremors, speech difficulty and weakness.  Psychiatric/Behavioral: Negative for agitation, dysphoric mood and sleep disturbance. The patient is not nervous/anxious.     Objective:  BP 128/70 (BP Location: Right Arm, Patient Position: Sitting, Cuff Size: Large)   Pulse 88   Temp 98.5 F (36.9 C) (Oral)   SpO2 98%   BP Readings from Last 3 Encounters:  10/24/19 128/70  09/18/19 138/66  03/27/19 (!) 144/86    Wt Readings from Last 3 Encounters:  09/18/19 185 lb (83.9 kg)  03/27/19 175 lb (79.4 kg)  09/06/17 183 lb (83 kg)    Physical Exam Constitutional:      General: He is not in acute distress.    Appearance: He is well-developed.     Comments: NAD  Eyes:     Conjunctiva/sclera: Conjunctivae normal.     Pupils: Pupils are equal, round, and reactive to light.  Neck:     Thyroid: No thyromegaly.     Vascular: No JVD.  Cardiovascular:     Rate and Rhythm: Normal rate and regular rhythm.     Heart sounds: Normal heart sounds. No murmur heard.  No friction rub. No gallop.   Pulmonary:     Effort: Pulmonary effort is normal. No respiratory distress.     Breath sounds:  Normal breath sounds. No wheezing or rales.  Chest:     Chest wall: No tenderness.  Abdominal:     General: Bowel sounds are normal. There is no distension.     Palpations: Abdomen is soft. There is no mass.     Tenderness: There is no abdominal tenderness. There is no guarding or rebound.  Musculoskeletal:        General: Swelling and tenderness present. Normal range of motion.     Cervical back: Normal range of motion.     Right lower leg: Edema present.     Left lower leg: Edema present.  Lymphadenopathy:     Cervical: No cervical adenopathy.  Skin:    General: Skin is warm and dry.      Findings: Lesion present. No erythema or rash.  Neurological:     Mental Status: He is alert and oriented to person, place, and time.     Cranial Nerves: No cranial nerve deficit.     Motor: Weakness present. No abnormal muscle tone.     Coordination: Coordination abnormal.     Gait: Gait abnormal.     Deep Tendon Reflexes: Reflexes are normal and symmetric.  Psychiatric:        Behavior: Behavior normal.        Thought Content: Thought content normal.        Judgment: Judgment normal.     in a w/c B feet edema up to 1+ Dry skin Scabs on the R foot L big toe toenail is black  Lab Results  Component Value Date   WBC 10.9 (H) 09/18/2019   HGB 11.7 (L) 09/18/2019   HCT 33.6 (L) 09/18/2019   PLT 317 09/18/2019   GLUCOSE 128 (H) 09/18/2019   CHOL 238 (H) 03/27/2019   TRIG 304.0 (H) 03/27/2019   HDL 29.40 (L) 03/27/2019   LDLDIRECT 141.0 03/27/2019   LDLCALC 102 (H) 06/13/2013   ALT 18 09/18/2019   AST 16 09/18/2019   NA 136 09/18/2019   K 4.0 09/18/2019   CL 101 09/18/2019   CREATININE 1.57 (H) 09/18/2019   BUN 28 (H) 09/18/2019   CO2 22 09/18/2019   TSH 1.55 09/18/2019   PSA 0.69 03/27/2019   HGBA1C 5.6 03/27/2019    DG Chest 2 View  Result Date: 04/25/2017 CLINICAL DATA:  Cough and congestion for 3 days EXAM: CHEST - 2 VIEW COMPARISON:  01/13/2016 FINDINGS: Normal heart size. Lungs clear. No pneumothorax. No pleural effusion. IMPRESSION: No active cardiopulmonary disease. Electronically Signed   By: Marybelle Killings M.D.   On: 04/25/2017 08:04    Assessment & Plan:    Walker Kehr, MD

## 2019-10-24 NOTE — Assessment & Plan Note (Signed)
W/c Refused PT

## 2019-10-24 NOTE — Assessment & Plan Note (Signed)
Wt Readings from Last 3 Encounters:  09/18/19 185 lb (83.9 kg)  03/27/19 175 lb (79.4 kg)  09/06/17 183 lb (83 kg)

## 2019-10-24 NOTE — Assessment & Plan Note (Signed)
Better Doxy x 1 mo Bactroban Pt declined tests, wound clinic rf Treat edema

## 2019-10-24 NOTE — Assessment & Plan Note (Signed)
  The pt is taking 15 different vitamins or supplements

## 2019-10-24 NOTE — Assessment & Plan Note (Addendum)
Pt refused wound Clinic, tests The pt is taking 15 different vitamins or supplements

## 2019-10-24 NOTE — Assessment & Plan Note (Signed)
Labs

## 2019-12-06 ENCOUNTER — Other Ambulatory Visit: Payer: Self-pay

## 2019-12-11 ENCOUNTER — Encounter: Payer: Self-pay | Admitting: Internal Medicine

## 2019-12-11 ENCOUNTER — Ambulatory Visit (INDEPENDENT_AMBULATORY_CARE_PROVIDER_SITE_OTHER): Payer: Medicare Other | Admitting: Internal Medicine

## 2019-12-11 ENCOUNTER — Other Ambulatory Visit: Payer: Self-pay

## 2019-12-11 DIAGNOSIS — R531 Weakness: Secondary | ICD-10-CM

## 2019-12-11 DIAGNOSIS — N1832 Chronic kidney disease, stage 3b: Secondary | ICD-10-CM | POA: Diagnosis not present

## 2019-12-11 DIAGNOSIS — L600 Ingrowing nail: Secondary | ICD-10-CM

## 2019-12-11 DIAGNOSIS — I10 Essential (primary) hypertension: Secondary | ICD-10-CM | POA: Diagnosis not present

## 2019-12-11 DIAGNOSIS — L298 Other pruritus: Secondary | ICD-10-CM | POA: Diagnosis not present

## 2019-12-11 DIAGNOSIS — M48062 Spinal stenosis, lumbar region with neurogenic claudication: Secondary | ICD-10-CM

## 2019-12-11 MED ORDER — DOXYCYCLINE HYCLATE 100 MG PO TABS
100.0000 mg | ORAL_TABLET | Freq: Two times a day (BID) | ORAL | 0 refills | Status: DC
Start: 2019-12-11 — End: 2020-01-05

## 2019-12-11 MED ORDER — MUPIROCIN 2 % EX OINT: TOPICAL_OINTMENT | CUTANEOUS | 1 refills | Status: DC

## 2019-12-11 NOTE — Progress Notes (Signed)
Subjective:  Patient ID: Marvin Morrison, male    DOB: 1940-07-05  Age: 79 y.o. MRN: [id number]  CC: Follow-up   HPI Marvin Morrison presents for thick painful toenails especially on the right big toe.  Follow-up on rash and wounds on legs and feet.  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  Marvin Morrison has been wheelchair-bound.  His wife has been giving him baths with wet wipes only.  He has been unable to wash his feet or trim his toenails.  Marvin Morrison declined a referral to see a specialist, wound clinic etc.  Outpatient Medications Prior to Visit  Medication Sig Dispense Refill  . acyclovir (ZOVIRAX) 400 MG tablet Take 1 tablet (400 mg total) by mouth 2 (two) times daily. 180 tablet 3  . furosemide (LASIX) 40 MG tablet Take 1 tablet (40 mg total) by mouth daily as needed for edema. 30 tablet 3  . gabapentin (NEURONTIN) 100 MG capsule TAKE 1 CAPSULE BY MOUTH THREE TIMES A DAY 270 capsule 3  . metoprolol succinate (TOPROL-XL) 50 MG 24 hr tablet TAKE 1 TABLET EVERY DAY. TAKE IMMEDIATELY FOLLOWING A MEAL 90 tablet 3  . Multiple Vitamin (MULTIVITAMIN WITH MINERALS) TABS tablet Take 1 tablet by mouth daily.    . mupirocin ointment (BACTROBAN) 2 % APPLY TO LEG WOUND WITH DRESSING CHANGE DAILY OR TWICE A DAY 30 g 1  . ondansetron (ZOFRAN) 4 MG tablet TAKE 1 TABLET BY MOUTH DAILY AS NEEDED FOR NAUSEA OR VOMITING. 30 tablet 3  . pantoprazole (PROTONIX) 40 MG tablet Take 1 tablet (40 mg total) by mouth daily. 90 tablet 3  . tamsulosin (FLOMAX) 0.4 MG CAPS capsule Take 1 capsule (0.4 mg total) by mouth daily. 90 capsule 3  . triamcinolone ointment (KENALOG) 0.1 % APPLY TO AFFECTED AREA TWICE A DAY 240 g 2  . doxycycline (VIBRA-TABS) 100 MG tablet Take 1 tablet (100 mg total) by mouth 2 (two) times daily. (Patient not taking: Reported on 12/11/2019) 60 tablet 0   No facility-administered medications prior to visit.    ROS: Review of Systems  Constitutional: Negative for appetite change, fatigue and unexpected weight  change.  HENT: Negative for congestion, nosebleeds, sneezing, sore throat and trouble swallowing.   Eyes: Negative for itching and visual disturbance.  Respiratory: Negative for cough.   Cardiovascular: Negative for chest pain, palpitations and leg swelling.  Gastrointestinal: Negative for abdominal distention, blood in stool, diarrhea and nausea.  Genitourinary: Negative for frequency and hematuria.  Musculoskeletal: Positive for arthralgias, back pain and gait problem. Negative for joint swelling and neck pain.  Skin: Positive for color change, rash and wound.  Neurological: Positive for weakness. Negative for dizziness, tremors and speech difficulty.  Psychiatric/Behavioral: Positive for behavioral problems. Negative for agitation, dysphoric mood, sleep disturbance and suicidal ideas. The patient is not nervous/anxious.     Objective:  BP 118/68 (BP Location: Left Arm)   Pulse 92   Temp 98.6 F (37 C) (Oral)   SpO2 96%   BP Readings from Last 3 Encounters:  12/11/19 118/68  10/24/19 128/70  09/18/19 138/66    Wt Readings from Last 3 Encounters:  09/18/19 185 lb (83.9 kg)  03/27/19 175 lb (79.4 kg)  09/06/17 183 lb (83 kg)    Physical Exam Constitutional:      General: He is not in acute distress.    Appearance: He is well-developed.     Comments: NAD  HENT:     Mouth/Throat:     Mouth: Oropharynx is clear  and moist.  Eyes:     Conjunctiva/sclera: Conjunctivae normal.     Pupils: Pupils are equal, round, and reactive to light.  Neck:     Thyroid: No thyromegaly.     Vascular: No JVD.  Cardiovascular:     Rate and Rhythm: Normal rate and regular rhythm.     Pulses: Intact distal pulses.     Heart sounds: Normal heart sounds. No murmur heard. No friction rub. No gallop.   Pulmonary:     Effort: Pulmonary effort is normal. No respiratory distress.     Breath sounds: Normal breath sounds. No wheezing or rales.  Chest:     Chest wall: No tenderness.  Abdominal:      General: Bowel sounds are normal. There is no distension.     Palpations: Abdomen is soft. There is no mass.     Tenderness: There is no abdominal tenderness. There is no guarding or rebound.  Musculoskeletal:        General: No tenderness or edema. Normal range of motion.     Cervical back: Normal range of motion.     Right lower leg: No edema.     Left lower leg: No edema.  Lymphadenopathy:     Cervical: No cervical adenopathy.  Skin:    General: Skin is warm and dry.     Findings: Lesion and rash present. No erythema.  Neurological:     Mental Status: He is alert and oriented to person, place, and time.     Cranial Nerves: No cranial nerve deficit.     Motor: Weakness present. No abnormal muscle tone.     Coordination: He displays a negative Romberg sign. Coordination abnormal.     Gait: Gait abnormal.     Deep Tendon Reflexes: Reflexes are normal and symmetric.  Psychiatric:        Mood and Affect: Mood and affect normal.        Behavior: Behavior normal.        Thought Content: Thought content normal.        Judgment: Judgment normal.   In a w/c  Rash on the face.  Scaly skin on both lower legs and feet with several scabbed wounds.  Thick toenails with brown debris buildup over toes.  It started to bleed when moved.  The skin was removed and pus was expressed underneath ingrown R lat lateral big toe toenail.  There was a lot of dead skin debris underneath toenails, between the toes mixed with lotion buildup  Procedure note:  Incision and Drainage of an ingrown toenail abscess   Indication : R big toe ingrown toenail infection     Risks including unsuccessful procedure , possible need for a repeat procedure due to pus accumulation, scar formation, and others as well as benefits were explained to the patient in detail. Written consent was obtained  The area of an abscess was prepped with povidone-iodine and draped in a sterile fashion. Local nerve block anesthesia with   2     cc of 2% lidocaine and epinephrine  was administered laterally only.  A flap of granulating tissue over the lateral nail was removed. About 0.3 cc of purulent material and blood was expressed. The abscess cavity was cleaned. The nail corner was clipped. The cavity was filled w/abx oinment. The wound was dressed with antibiotic ointment..  Tolerated well. Complications: None.   Wound instructions provided.  Other thick toenails were trimmed B with difficulty    A total  time of >45 minutes was spent preparing to see the patient, reviewing tests, x-rays, operative reports and outside records.  Also, obtaining history and performing comprehensive physical exam.  Additionally, counseling the patient regarding the above listed issues.   Finally, documenting clinical information in the health records, coordination of care, educating the patient on foot infection, rash, trimming his toenails. It is a complex case.    Lab Results  Component Value Date   WBC 10.9 (H) 09/18/2019   HGB 11.7 (L) 09/18/2019   HCT 33.6 (L) 09/18/2019   PLT 317 09/18/2019   GLUCOSE 128 (H) 09/18/2019   CHOL 238 (H) 03/27/2019   TRIG 304.0 (H) 03/27/2019   HDL 29.40 (L) 03/27/2019   LDLDIRECT 141.0 03/27/2019   LDLCALC 102 (H) 06/13/2013   ALT 18 09/18/2019   AST 16 09/18/2019   NA 136 09/18/2019   K 4.0 09/18/2019   CL 101 09/18/2019   CREATININE 1.57 (H) 09/18/2019   BUN 28 (H) 09/18/2019   CO2 22 09/18/2019   TSH 1.55 09/18/2019   PSA 0.69 03/27/2019   HGBA1C 5.6 03/27/2019    DG Chest 2 View  Result Date: 04/25/2017 CLINICAL DATA:  Cough and congestion for 3 days EXAM: CHEST - 2 VIEW COMPARISON:  01/13/2016 FINDINGS: Normal heart size. Lungs clear. No pneumothorax. No pleural effusion. IMPRESSION: No active cardiopulmonary disease. Electronically Signed   By: Marybelle Killings M.D.   On: 04/25/2017 08:04    Assessment & Plan:     Follow-up: No follow-ups on file.  Walker Kehr, MD

## 2019-12-12 ENCOUNTER — Encounter: Payer: Self-pay | Admitting: Internal Medicine

## 2019-12-12 DIAGNOSIS — L600 Ingrowing nail: Secondary | ICD-10-CM | POA: Insufficient documentation

## 2019-12-12 NOTE — Assessment & Plan Note (Signed)
Unchanged

## 2019-12-12 NOTE — Assessment & Plan Note (Signed)
  BP Readings from Last 3 Encounters:  12/11/19 118/68  10/24/19 128/70  09/18/19 138/66  Furosemide, Toprol

## 2019-12-12 NOTE — Assessment & Plan Note (Signed)
W/c bound  

## 2019-12-12 NOTE — Assessment & Plan Note (Signed)
R big toenail - lateral Toenal wdge resection, I&D Doxy Rx Bactroban

## 2019-12-12 NOTE — Assessment & Plan Note (Signed)
Monitoring labs q 6 months

## 2019-12-30 ENCOUNTER — Other Ambulatory Visit: Payer: Self-pay | Admitting: Internal Medicine

## 2020-01-21 ENCOUNTER — Other Ambulatory Visit: Payer: Self-pay | Admitting: Internal Medicine

## 2020-02-06 ENCOUNTER — Other Ambulatory Visit: Payer: Self-pay | Admitting: Internal Medicine

## 2020-02-26 ENCOUNTER — Other Ambulatory Visit: Payer: Self-pay | Admitting: Internal Medicine

## 2020-02-28 NOTE — Telephone Encounter (Signed)
Patient calling upset because medication hasnt been filled

## 2020-04-01 ENCOUNTER — Encounter: Payer: Self-pay | Admitting: Internal Medicine

## 2020-04-01 ENCOUNTER — Telehealth: Payer: Self-pay | Admitting: Internal Medicine

## 2020-04-01 ENCOUNTER — Ambulatory Visit (INDEPENDENT_AMBULATORY_CARE_PROVIDER_SITE_OTHER): Payer: Medicare Other | Admitting: Internal Medicine

## 2020-04-01 ENCOUNTER — Other Ambulatory Visit: Payer: Self-pay

## 2020-04-01 VITALS — BP 142/88 | HR 88 | Temp 98.4°F | Ht 72.0 in

## 2020-04-01 DIAGNOSIS — R634 Abnormal weight loss: Secondary | ICD-10-CM

## 2020-04-01 DIAGNOSIS — T452X1D Poisoning by vitamins, accidental (unintentional), subsequent encounter: Secondary | ICD-10-CM | POA: Diagnosis not present

## 2020-04-01 DIAGNOSIS — R269 Unspecified abnormalities of gait and mobility: Secondary | ICD-10-CM | POA: Diagnosis not present

## 2020-04-01 DIAGNOSIS — N1832 Chronic kidney disease, stage 3b: Secondary | ICD-10-CM

## 2020-04-01 DIAGNOSIS — R7989 Other specified abnormal findings of blood chemistry: Secondary | ICD-10-CM

## 2020-04-01 DIAGNOSIS — L97512 Non-pressure chronic ulcer of other part of right foot with fat layer exposed: Secondary | ICD-10-CM | POA: Diagnosis not present

## 2020-04-01 DIAGNOSIS — D500 Iron deficiency anemia secondary to blood loss (chronic): Secondary | ICD-10-CM | POA: Diagnosis not present

## 2020-04-01 DIAGNOSIS — Z5329 Procedure and treatment not carried out because of patient's decision for other reasons: Secondary | ICD-10-CM | POA: Diagnosis not present

## 2020-04-01 DIAGNOSIS — Z91199 Patient's noncompliance with other medical treatment and regimen due to unspecified reason: Secondary | ICD-10-CM

## 2020-04-01 LAB — COMPREHENSIVE METABOLIC PANEL
ALT: 13 U/L (ref 0–53)
AST: 15 U/L (ref 0–37)
Albumin: 4 g/dL (ref 3.5–5.2)
Alkaline Phosphatase: 59 U/L (ref 39–117)
BUN: 32 mg/dL — ABNORMAL HIGH (ref 6–23)
CO2: 25 mEq/L (ref 19–32)
Calcium: 9.3 mg/dL (ref 8.4–10.5)
Chloride: 100 mEq/L (ref 96–112)
Creatinine, Ser: 1.82 mg/dL — ABNORMAL HIGH (ref 0.40–1.50)
GFR: 34.4 mL/min — ABNORMAL LOW (ref >60.00)
Glucose, Bld: 120 mg/dL — ABNORMAL HIGH (ref 70–99)
Potassium: 3.9 mEq/L (ref 3.5–5.1)
Sodium: 134 mEq/L — ABNORMAL LOW (ref 135–145)
Total Bilirubin: 0.4 mg/dL (ref 0.2–1.2)
Total Protein: 6.6 g/dL (ref 6.0–8.3)

## 2020-04-01 LAB — CBC WITH DIFFERENTIAL/PLATELET
Basophils Absolute: 0 10*3/uL (ref 0.0–0.1)
Basophils Relative: 0.5 % (ref 0.0–3.0)
Eosinophils Absolute: 0.3 10*3/uL (ref 0.0–0.7)
Eosinophils Relative: 2.9 % (ref 0.0–5.0)
HCT: 34.4 % — ABNORMAL LOW (ref 39.0–52.0)
Hemoglobin: 11.7 g/dL — ABNORMAL LOW (ref 13.0–17.0)
Lymphocytes Relative: 14.3 % (ref 12.0–46.0)
Lymphs Abs: 1.3 10*3/uL (ref 0.7–4.0)
MCHC: 34 g/dL (ref 30.0–36.0)
MCV: 100.5 fl — ABNORMAL HIGH (ref 78.0–100.0)
Monocytes Absolute: 0.9 10*3/uL (ref 0.1–1.0)
Monocytes Relative: 9.8 % (ref 3.0–12.0)
Neutro Abs: 6.5 10*3/uL (ref 1.4–7.7)
Neutrophils Relative %: 72.5 % (ref 43.0–77.0)
Platelets: 202 10*3/uL (ref 150.0–400.0)
RBC: 3.43 Mil/uL — ABNORMAL LOW (ref 4.22–5.81)
RDW: 13.9 % (ref 11.5–15.5)
WBC: 9 10*3/uL (ref 4.0–10.5)

## 2020-04-01 LAB — VITAMIN D 25 HYDROXY (VIT D DEFICIENCY, FRACTURES): VITD: 81 ng/mL (ref 30.00–100.00)

## 2020-04-01 MED ORDER — ACYCLOVIR 400 MG PO TABS: 400.0000 mg | ORAL_TABLET | Freq: Two times a day (BID) | ORAL | 3 refills | Status: DC

## 2020-04-01 MED ORDER — GABAPENTIN 100 MG PO CAPS: ORAL_CAPSULE | ORAL | 3 refills | Status: AC

## 2020-04-01 MED ORDER — METOPROLOL SUCCINATE ER 50 MG PO TB24: ORAL_TABLET | ORAL | 3 refills | Status: DC

## 2020-04-01 MED ORDER — FUROSEMIDE 40 MG PO TABS: 40.0000 mg | ORAL_TABLET | Freq: Every day | ORAL | 3 refills | Status: DC | PRN

## 2020-04-01 MED ORDER — TRIAMCINOLONE ACETONIDE 0.1 % EX OINT: TOPICAL_OINTMENT | CUTANEOUS | 2 refills | Status: DC

## 2020-04-01 MED ORDER — PANTOPRAZOLE SODIUM 40 MG PO TBEC: 40.0000 mg | DELAYED_RELEASE_TABLET | Freq: Every day | ORAL | 3 refills | Status: AC

## 2020-04-01 NOTE — Patient Instructions (Addendum)
GLYTONE exfoliating body lotion by Desmond Dike (free acid value 17.5) to use on legs, feet, forehead  Diabetic socks

## 2020-04-01 NOTE — Progress Notes (Signed)
Subjective:  Patient ID: Marvin Morrison, male    DOB: 03/26/1940  Age: 80 y.o. MRN: [id number]  CC: Follow-up (Pt is here for 3 mon follow up.) and Medication Refill (Pt needs refills on gabapentin,metoprolol, and flomax)   HPI ALAZAR Morrison presents for LS spinal stenosis, LE paresis He is here with his wife  Outpatient Medications Prior to Visit  Medication Sig Dispense Refill  . acyclovir (ZOVIRAX) 400 MG tablet Take 1 tablet (400 mg total) by mouth 2 (two) times daily. 180 tablet 3  . doxycycline (VIBRA-TABS) 100 MG tablet TAKE 1 TABLET BY MOUTH TWICE A DAY 30 tablet 0  . furosemide (LASIX) 40 MG tablet Take 1 tablet (40 mg total) by mouth daily as needed for edema. 30 tablet 3  . gabapentin (NEURONTIN) 100 MG capsule TAKE 1 CAPSULE BY MOUTH THREE TIMES A DAY 270 capsule 3  . metoprolol succinate (TOPROL-XL) 50 MG 24 hr tablet TAKE 1 TABLET EVERY DAY. TAKE IMMEDIATELY FOLLOWING A MEAL 90 tablet 3  . Multiple Vitamin (MULTIVITAMIN WITH MINERALS) TABS tablet Take 1 tablet by mouth daily.    . mupirocin ointment (BACTROBAN) 2 % APPLY TO LEG WOUND WITH DRESSING CHANGE DAILY OR TWICE A DAY 22 g 2  . ondansetron (ZOFRAN) 4 MG tablet TAKE 1 TABLET BY MOUTH EVERY DAY AS NEEDED FOR NAUSEA OR VOMITING 30 tablet 1  . pantoprazole (PROTONIX) 40 MG tablet Take 1 tablet (40 mg total) by mouth daily. 90 tablet 3  . tamsulosin (FLOMAX) 0.4 MG CAPS capsule Take 1 capsule (0.4 mg total) by mouth daily. 90 capsule 3  . triamcinolone ointment (KENALOG) 0.1 % APPLY TO AFFECTED AREA TWICE A DAY 240 g 2   No facility-administered medications prior to visit.    ROS: Review of Systems  Constitutional: Positive for fatigue. Negative for appetite change and unexpected weight change.  HENT: Negative for congestion, nosebleeds, sneezing, sore throat and trouble swallowing.   Eyes: Negative for itching and visual disturbance.  Respiratory: Negative for cough.   Cardiovascular: Negative for chest pain,  palpitations and leg swelling.  Gastrointestinal: Negative for abdominal distention, blood in stool, diarrhea and nausea.  Genitourinary: Negative for frequency and hematuria.  Musculoskeletal: Positive for gait problem. Negative for back pain, joint swelling and neck pain.  Skin: Positive for color change, rash and wound.  Neurological: Positive for weakness. Negative for dizziness, tremors and speech difficulty.  Psychiatric/Behavioral: Negative for agitation, dysphoric mood, sleep disturbance and suicidal ideas. The patient is not nervous/anxious.     Objective:  BP (!) 142/88 (BP Location: Left Arm, Patient Position: Sitting, Cuff Size: Normal)   Pulse 88   Temp 98.4 F (36.9 C) (Oral)   Ht 6' (1.829 m)   SpO2 96%   BMI 25.09 kg/m   BP Readings from Last 3 Encounters:  04/01/20 (!) 142/88  12/11/19 118/68  10/24/19 128/70    Wt Readings from Last 3 Encounters:  09/18/19 185 lb (83.9 kg)  03/27/19 175 lb (79.4 kg)  09/06/17 183 lb (83 kg)    Physical Exam Constitutional:      General: He is not in acute distress.    Appearance: He is well-developed.     Comments: NAD  Eyes:     Conjunctiva/sclera: Conjunctivae normal.     Pupils: Pupils are equal, round, and reactive to light.  Neck:     Thyroid: No thyromegaly.     Vascular: No JVD.  Cardiovascular:  Rate and Rhythm: Normal rate and regular rhythm.     Heart sounds: Normal heart sounds. No murmur heard. No friction rub. No gallop.   Pulmonary:     Effort: Pulmonary effort is normal. No respiratory distress.     Breath sounds: Normal breath sounds. No wheezing or rales.  Chest:     Chest wall: No tenderness.  Abdominal:     General: Bowel sounds are normal. There is no distension.     Palpations: Abdomen is soft. There is no mass.     Tenderness: There is no abdominal tenderness. There is no guarding or rebound.  Musculoskeletal:        General: No tenderness. Normal range of motion.     Cervical back:  Normal range of motion.  Lymphadenopathy:     Cervical: No cervical adenopathy.  Skin:    General: Skin is warm and dry.     Findings: Erythema, lesion and rash present.  Neurological:     Mental Status: He is alert and oriented to person, place, and time.     Cranial Nerves: No cranial nerve deficit.     Motor: Weakness present. No abnormal muscle tone.     Coordination: Coordination abnormal.     Gait: Gait abnormal.     Deep Tendon Reflexes: Reflexes abnormal.  Psychiatric:        Behavior: Behavior normal.        Thought Content: Thought content normal.        Judgment: Judgment normal.   Marvin Morrison is in a wheelchair.  His lower extremities are scaly with dry skin scabs and deformed discolored nails.  Lab Results  Component Value Date   WBC 10.9 (H) 09/18/2019   HGB 11.7 (L) 09/18/2019   HCT 33.6 (L) 09/18/2019   PLT 317 09/18/2019   GLUCOSE 128 (H) 09/18/2019   CHOL 238 (H) 03/27/2019   TRIG 304.0 (H) 03/27/2019   HDL 29.40 (L) 03/27/2019   LDLDIRECT 141.0 03/27/2019   LDLCALC 102 (H) 06/13/2013   ALT 18 09/18/2019   AST 16 09/18/2019   NA 136 09/18/2019   K 4.0 09/18/2019   CL 101 09/18/2019   CREATININE 1.57 (H) 09/18/2019   BUN 28 (H) 09/18/2019   CO2 22 09/18/2019   TSH 1.55 09/18/2019   PSA 0.69 03/27/2019   HGBA1C 5.6 03/27/2019    DG Chest 2 View  Result Date: 04/25/2017 CLINICAL DATA:  Cough and congestion for 3 days EXAM: CHEST - 2 VIEW COMPARISON:  01/13/2016 FINDINGS: Normal heart size. Lungs clear. No pneumothorax. No pleural effusion. IMPRESSION: No active cardiopulmonary disease. Electronically Signed   By: Marybelle Killings M.D.   On: 04/25/2017 08:04    Assessment & Plan:    Walker Kehr, MD

## 2020-04-01 NOTE — Assessment & Plan Note (Signed)
CMET 

## 2020-04-01 NOTE — Assessment & Plan Note (Signed)
LS spinal stenosis LE paresis

## 2020-04-01 NOTE — Assessment & Plan Note (Signed)
resolved 

## 2020-04-02 ENCOUNTER — Telehealth: Payer: Self-pay

## 2020-04-02 NOTE — Telephone Encounter (Signed)
During result note call, pt states he forgot to mention during his OV yesterday that he has been having 1 diarrhea stool/day x 5days. He would like to know if he can take something specific?

## 2020-04-02 NOTE — Telephone Encounter (Signed)
Patient calling, states that the pharmacy didn't receive the refill

## 2020-04-02 NOTE — Telephone Encounter (Signed)
Pt notified that Flomax was confirmed as received by the pharmacy on 04/01/20 at 1652.  Pt verb understanding.

## 2020-04-03 NOTE — Telephone Encounter (Signed)
Take over-the-counter Imodium A-D 1-2 tablets as needed for diarrhea.  Thanks

## 2020-04-03 NOTE — Telephone Encounter (Signed)
Pt notified of PCP response & verb understanding.  Pt now requesting refill of the cream he was putting on his feet, however, he is unable to tell me the name of the cream.  He states PCP will know which one it is.  Sent to PCP.

## 2020-04-05 DIAGNOSIS — Z5329 Procedure and treatment not carried out because of patient's decision for other reasons: Secondary | ICD-10-CM | POA: Insufficient documentation

## 2020-04-05 DIAGNOSIS — Z91199 Patient's noncompliance with other medical treatment and regimen due to unspecified reason: Secondary | ICD-10-CM | POA: Insufficient documentation

## 2020-04-05 NOTE — Assessment & Plan Note (Signed)
The patient declined she is wound clinic appointment, referrals to other specialists

## 2020-04-06 MED ORDER — MUPIROCIN 2 % EX OINT: TOPICAL_OINTMENT | CUTANEOUS | 1 refills | Status: DC

## 2020-04-06 NOTE — Telephone Encounter (Signed)
Ok - Bactroban Thx

## 2020-04-06 NOTE — Addendum Note (Signed)
Addended by: Cassandria Anger on: 04/06/2020 07:41 AM   Modules accepted: Orders

## 2020-04-07 NOTE — Telephone Encounter (Signed)
Pt notified that prescription has been sent to the pharmacy on file. Pt appreciative of help.

## 2020-05-04 ENCOUNTER — Other Ambulatory Visit: Payer: Self-pay | Admitting: Internal Medicine

## 2020-06-01 ENCOUNTER — Emergency Department (HOSPITAL_COMMUNITY): Payer: Medicare Other

## 2020-06-01 ENCOUNTER — Inpatient Hospital Stay (HOSPITAL_COMMUNITY)
Admission: EM | Admit: 2020-06-01 | Discharge: 2020-06-08 | DRG: 682 | Disposition: A | Discharge status: Skilled Nursing Facility | Payer: Medicare Other | Attending: Internal Medicine | Admitting: Internal Medicine

## 2020-06-01 ENCOUNTER — Other Ambulatory Visit: Payer: Self-pay

## 2020-06-01 ENCOUNTER — Encounter (HOSPITAL_COMMUNITY): Payer: Self-pay | Admitting: Internal Medicine

## 2020-06-01 DIAGNOSIS — E782 Mixed hyperlipidemia: Secondary | ICD-10-CM

## 2020-06-01 DIAGNOSIS — J9811 Atelectasis: Secondary | ICD-10-CM | POA: Diagnosis present

## 2020-06-01 DIAGNOSIS — D72829 Elevated white blood cell count, unspecified: Secondary | ICD-10-CM | POA: Diagnosis not present

## 2020-06-01 DIAGNOSIS — F419 Anxiety disorder, unspecified: Secondary | ICD-10-CM | POA: Diagnosis present

## 2020-06-01 DIAGNOSIS — G9341 Metabolic encephalopathy: Secondary | ICD-10-CM | POA: Diagnosis present

## 2020-06-01 DIAGNOSIS — Z66 Do not resuscitate: Secondary | ICD-10-CM | POA: Diagnosis not present

## 2020-06-01 DIAGNOSIS — L97519 Non-pressure chronic ulcer of other part of right foot with unspecified severity: Secondary | ICD-10-CM | POA: Diagnosis present

## 2020-06-01 DIAGNOSIS — I444 Left anterior fascicular block: Secondary | ICD-10-CM | POA: Diagnosis present

## 2020-06-01 DIAGNOSIS — M25572 Pain in left ankle and joints of left foot: Secondary | ICD-10-CM | POA: Diagnosis present

## 2020-06-01 DIAGNOSIS — F0390 Unspecified dementia without behavioral disturbance: Secondary | ICD-10-CM | POA: Diagnosis present

## 2020-06-01 DIAGNOSIS — R21 Rash and other nonspecific skin eruption: Secondary | ICD-10-CM | POA: Diagnosis present

## 2020-06-01 DIAGNOSIS — Z8249 Family history of ischemic heart disease and other diseases of the circulatory system: Secondary | ICD-10-CM

## 2020-06-01 DIAGNOSIS — D539 Nutritional anemia, unspecified: Secondary | ICD-10-CM | POA: Diagnosis present

## 2020-06-01 DIAGNOSIS — R195 Other fecal abnormalities: Secondary | ICD-10-CM

## 2020-06-01 DIAGNOSIS — I739 Peripheral vascular disease, unspecified: Secondary | ICD-10-CM | POA: Diagnosis not present

## 2020-06-01 DIAGNOSIS — Y92009 Unspecified place in unspecified non-institutional (private) residence as the place of occurrence of the external cause: Secondary | ICD-10-CM | POA: Diagnosis not present

## 2020-06-01 DIAGNOSIS — G319 Degenerative disease of nervous system, unspecified: Secondary | ICD-10-CM | POA: Diagnosis not present

## 2020-06-01 DIAGNOSIS — I129 Hypertensive chronic kidney disease with stage 1 through stage 4 chronic kidney disease, or unspecified chronic kidney disease: Secondary | ICD-10-CM | POA: Diagnosis present

## 2020-06-01 DIAGNOSIS — E876 Hypokalemia: Secondary | ICD-10-CM | POA: Diagnosis present

## 2020-06-01 DIAGNOSIS — N1832 Chronic kidney disease, stage 3b: Secondary | ICD-10-CM | POA: Diagnosis present

## 2020-06-01 DIAGNOSIS — G4700 Insomnia, unspecified: Secondary | ICD-10-CM | POA: Diagnosis present

## 2020-06-01 DIAGNOSIS — K3189 Other diseases of stomach and duodenum: Secondary | ICD-10-CM | POA: Diagnosis not present

## 2020-06-01 DIAGNOSIS — I10 Essential (primary) hypertension: Secondary | ICD-10-CM | POA: Diagnosis present

## 2020-06-01 DIAGNOSIS — M79605 Pain in left leg: Secondary | ICD-10-CM | POA: Diagnosis not present

## 2020-06-01 DIAGNOSIS — L97512 Non-pressure chronic ulcer of other part of right foot with fat layer exposed: Secondary | ICD-10-CM | POA: Diagnosis not present

## 2020-06-01 DIAGNOSIS — Z7189 Other specified counseling: Secondary | ICD-10-CM | POA: Diagnosis not present

## 2020-06-01 DIAGNOSIS — R627 Adult failure to thrive: Secondary | ICD-10-CM | POA: Diagnosis present

## 2020-06-01 DIAGNOSIS — Z9119 Patient's noncompliance with other medical treatment and regimen: Secondary | ICD-10-CM

## 2020-06-01 DIAGNOSIS — N179 Acute kidney failure, unspecified: Principal | ICD-10-CM

## 2020-06-01 DIAGNOSIS — W1830XA Fall on same level, unspecified, initial encounter: Secondary | ICD-10-CM | POA: Diagnosis present

## 2020-06-01 DIAGNOSIS — M85871 Other specified disorders of bone density and structure, right ankle and foot: Secondary | ICD-10-CM | POA: Diagnosis not present

## 2020-06-01 DIAGNOSIS — M11262 Other chondrocalcinosis, left knee: Secondary | ICD-10-CM | POA: Diagnosis not present

## 2020-06-01 DIAGNOSIS — E785 Hyperlipidemia, unspecified: Secondary | ICD-10-CM | POA: Diagnosis present

## 2020-06-01 DIAGNOSIS — Z515 Encounter for palliative care: Secondary | ICD-10-CM | POA: Diagnosis not present

## 2020-06-01 DIAGNOSIS — Z79899 Other long term (current) drug therapy: Secondary | ICD-10-CM

## 2020-06-01 DIAGNOSIS — D649 Anemia, unspecified: Secondary | ICD-10-CM | POA: Diagnosis not present

## 2020-06-01 DIAGNOSIS — M7989 Other specified soft tissue disorders: Secondary | ICD-10-CM | POA: Diagnosis not present

## 2020-06-01 DIAGNOSIS — R1110 Vomiting, unspecified: Secondary | ICD-10-CM

## 2020-06-01 DIAGNOSIS — Z83438 Family history of other disorder of lipoprotein metabolism and other lipidemia: Secondary | ICD-10-CM

## 2020-06-01 DIAGNOSIS — K219 Gastro-esophageal reflux disease without esophagitis: Secondary | ICD-10-CM | POA: Diagnosis present

## 2020-06-01 DIAGNOSIS — Z20822 Contact with and (suspected) exposure to covid-19: Secondary | ICD-10-CM | POA: Diagnosis present

## 2020-06-01 DIAGNOSIS — K6389 Other specified diseases of intestine: Secondary | ICD-10-CM | POA: Diagnosis not present

## 2020-06-01 DIAGNOSIS — D638 Anemia in other chronic diseases classified elsewhere: Secondary | ICD-10-CM | POA: Diagnosis not present

## 2020-06-01 DIAGNOSIS — R64 Cachexia: Secondary | ICD-10-CM | POA: Diagnosis present

## 2020-06-01 DIAGNOSIS — E43 Unspecified severe protein-calorie malnutrition: Secondary | ICD-10-CM | POA: Diagnosis present

## 2020-06-01 DIAGNOSIS — R4781 Slurred speech: Secondary | ICD-10-CM | POA: Diagnosis not present

## 2020-06-01 DIAGNOSIS — G9340 Encephalopathy, unspecified: Secondary | ICD-10-CM | POA: Diagnosis not present

## 2020-06-01 DIAGNOSIS — S92901A Unspecified fracture of right foot, initial encounter for closed fracture: Secondary | ICD-10-CM

## 2020-06-01 DIAGNOSIS — N19 Unspecified kidney failure: Secondary | ICD-10-CM | POA: Diagnosis not present

## 2020-06-01 DIAGNOSIS — R52 Pain, unspecified: Secondary | ICD-10-CM

## 2020-06-01 DIAGNOSIS — M1712 Unilateral primary osteoarthritis, left knee: Secondary | ICD-10-CM | POA: Diagnosis not present

## 2020-06-01 DIAGNOSIS — E8729 Other acidosis: Secondary | ICD-10-CM | POA: Diagnosis present

## 2020-06-01 DIAGNOSIS — K922 Gastrointestinal hemorrhage, unspecified: Secondary | ICD-10-CM

## 2020-06-01 DIAGNOSIS — E872 Acidosis: Secondary | ICD-10-CM | POA: Diagnosis not present

## 2020-06-01 DIAGNOSIS — R339 Retention of urine, unspecified: Secondary | ICD-10-CM | POA: Diagnosis not present

## 2020-06-01 DIAGNOSIS — R9082 White matter disease, unspecified: Secondary | ICD-10-CM | POA: Diagnosis not present

## 2020-06-01 DIAGNOSIS — R471 Dysarthria and anarthria: Secondary | ICD-10-CM | POA: Diagnosis not present

## 2020-06-01 DIAGNOSIS — Z6824 Body mass index (BMI) 24.0-24.9, adult: Secondary | ICD-10-CM

## 2020-06-01 DIAGNOSIS — Z7401 Bed confinement status: Secondary | ICD-10-CM

## 2020-06-01 DIAGNOSIS — R29810 Facial weakness: Secondary | ICD-10-CM | POA: Diagnosis present

## 2020-06-01 DIAGNOSIS — M7731 Calcaneal spur, right foot: Secondary | ICD-10-CM | POA: Diagnosis not present

## 2020-06-01 DIAGNOSIS — M2578 Osteophyte, vertebrae: Secondary | ICD-10-CM | POA: Diagnosis not present

## 2020-06-01 DIAGNOSIS — R262 Difficulty in walking, not elsewhere classified: Secondary | ICD-10-CM | POA: Diagnosis present

## 2020-06-01 DIAGNOSIS — R410 Disorientation, unspecified: Secondary | ICD-10-CM | POA: Diagnosis not present

## 2020-06-01 LAB — URINALYSIS, ROUTINE W REFLEX MICROSCOPIC
Bilirubin Urine: NEGATIVE
Glucose, UA: NEGATIVE mg/dL
Hgb urine dipstick: NEGATIVE
Ketones, ur: NEGATIVE mg/dL
Leukocytes,Ua: NEGATIVE
Nitrite: NEGATIVE
Protein, ur: 30 mg/dL — AB
Specific Gravity, Urine: 1.013 (ref 1.005–1.030)
pH: 5 (ref 5.0–8.0)

## 2020-06-01 LAB — BASIC METABOLIC PANEL
Anion gap: 17 — ABNORMAL HIGH (ref 5–15)
BUN: 114 mg/dL — ABNORMAL HIGH (ref 8–23)
CO2: 12 mmol/L — ABNORMAL LOW (ref 22–32)
Calcium: 8.6 mg/dL — ABNORMAL LOW (ref 8.9–10.3)
Chloride: 107 mmol/L (ref 98–111)
Creatinine, Ser: 4.43 mg/dL — ABNORMAL HIGH (ref 0.61–1.24)
GFR, Estimated: 13 mL/min — ABNORMAL LOW (ref >60)
Glucose, Bld: 156 mg/dL — ABNORMAL HIGH (ref 70–99)
Potassium: 3 mmol/L — ABNORMAL LOW (ref 3.5–5.1)
Sodium: 136 mmol/L (ref 135–145)

## 2020-06-01 LAB — COMPREHENSIVE METABOLIC PANEL
ALT: 15 U/L (ref 0–44)
AST: 14 U/L — ABNORMAL LOW (ref 15–41)
Albumin: 3 g/dL — ABNORMAL LOW (ref 3.5–5.0)
Alkaline Phosphatase: 51 U/L (ref 38–126)
Anion gap: 19 — ABNORMAL HIGH (ref 5–15)
BUN: 123 mg/dL — ABNORMAL HIGH (ref 8–23)
CO2: 11 mmol/L — ABNORMAL LOW (ref 22–32)
Calcium: 9.1 mg/dL (ref 8.9–10.3)
Chloride: 105 mmol/L (ref 98–111)
Creatinine, Ser: 4.77 mg/dL — ABNORMAL HIGH (ref 0.61–1.24)
GFR, Estimated: 12 mL/min — ABNORMAL LOW (ref >60)
Glucose, Bld: 146 mg/dL — ABNORMAL HIGH (ref 70–99)
Potassium: 3.2 mmol/L — ABNORMAL LOW (ref 3.5–5.1)
Sodium: 135 mmol/L (ref 135–145)
Total Bilirubin: 0.7 mg/dL (ref 0.3–1.2)
Total Protein: 6.3 g/dL — ABNORMAL LOW (ref 6.5–8.1)

## 2020-06-01 LAB — AMMONIA: Ammonia: 26 umol/L (ref 9–35)

## 2020-06-01 LAB — CBC WITH DIFFERENTIAL/PLATELET
Abs Immature Granulocytes: 0.2 10*3/uL — ABNORMAL HIGH (ref 0.00–0.07)
Basophils Absolute: 0.1 10*3/uL (ref 0.0–0.1)
Basophils Relative: 0 %
Eosinophils Absolute: 0.3 10*3/uL (ref 0.0–0.5)
Eosinophils Relative: 1 %
HCT: 28.7 % — ABNORMAL LOW (ref 39.0–52.0)
Hemoglobin: 9.8 g/dL — ABNORMAL LOW (ref 13.0–17.0)
Immature Granulocytes: 1 %
Lymphocytes Relative: 4 %
Lymphs Abs: 1 10*3/uL (ref 0.7–4.0)
MCH: 35 pg — ABNORMAL HIGH (ref 26.0–34.0)
MCHC: 34.1 g/dL (ref 30.0–36.0)
MCV: 102.5 fL — ABNORMAL HIGH (ref 80.0–100.0)
Monocytes Absolute: 1.9 10*3/uL — ABNORMAL HIGH (ref 0.1–1.0)
Monocytes Relative: 8 %
Neutro Abs: 20.4 10*3/uL — ABNORMAL HIGH (ref 1.7–7.7)
Neutrophils Relative %: 86 %
Platelets: 291 10*3/uL (ref 150–400)
RBC: 2.8 MIL/uL — ABNORMAL LOW (ref 4.22–5.81)
RDW: 14.4 % (ref 11.5–15.5)
WBC: 23.9 10*3/uL — ABNORMAL HIGH (ref 4.0–10.5)
nRBC: 0 % (ref 0.0–0.2)

## 2020-06-01 LAB — RETICULOCYTES
Immature Retic Fract: 13.9 % (ref 2.3–15.9)
RBC.: 2.79 MIL/uL — ABNORMAL LOW (ref 4.22–5.81)
Retic Count, Absolute: 80.1 10*3/uL (ref 19.0–186.0)
Retic Ct Pct: 2.9 % (ref 0.4–3.1)

## 2020-06-01 LAB — MAGNESIUM: Magnesium: 2.8 mg/dL — ABNORMAL HIGH (ref 1.7–2.4)

## 2020-06-01 LAB — SALICYLATE LEVEL: Salicylate Lvl: <7 mg/dL — ABNORMAL LOW (ref 7.0–30.0)

## 2020-06-01 LAB — RAPID URINE DRUG SCREEN, HOSP PERFORMED
Amphetamines: NOT DETECTED
Barbiturates: NOT DETECTED
Benzodiazepines: NOT DETECTED
Cocaine: NOT DETECTED
Opiates: NOT DETECTED
Tetrahydrocannabinol: NOT DETECTED

## 2020-06-01 LAB — IRON AND TIBC
Iron: 56 ug/dL (ref 45–182)
Saturation Ratios: 25 % (ref 17.9–39.5)
TIBC: 224 ug/dL — ABNORMAL LOW (ref 250–450)
UIBC: 168 ug/dL

## 2020-06-01 LAB — ETHANOL: Alcohol, Ethyl (B): <10 mg/dL (ref <10)

## 2020-06-01 LAB — RESP PANEL BY RT-PCR (FLU A&B, COVID) ARPGX2
Influenza A by PCR: NEGATIVE
Influenza B by PCR: NEGATIVE
SARS Coronavirus 2 by RT PCR: NEGATIVE

## 2020-06-01 LAB — FERRITIN: Ferritin: 241 ng/mL (ref 24–336)

## 2020-06-01 LAB — PROTIME-INR
INR: 1.4 — ABNORMAL HIGH (ref 0.8–1.2)
Prothrombin Time: 16.8 seconds — ABNORMAL HIGH (ref 11.4–15.2)

## 2020-06-01 LAB — TSH: TSH: 1.083 u[IU]/mL (ref 0.350–4.500)

## 2020-06-01 LAB — POC OCCULT BLOOD, ED: Fecal Occult Bld: POSITIVE — AB

## 2020-06-01 LAB — APTT: aPTT: 33 seconds (ref 24–36)

## 2020-06-01 LAB — FOLATE: Folate: 33.4 ng/mL (ref >5.9)

## 2020-06-01 LAB — CK: Total CK: 61 U/L (ref 49–397)

## 2020-06-01 MED ORDER — LORAZEPAM 2 MG/ML IJ SOLN
0.5000 mg | Freq: Once | INTRAMUSCULAR | Status: AC
  Administered 2020-06-01: 0.5 mg via INTRAVENOUS
  Filled 2020-06-01: qty 1

## 2020-06-01 MED ORDER — SODIUM CHLORIDE 0.9 % IV BOLUS
500.0000 mL | Freq: Once | INTRAVENOUS | Status: AC
  Administered 2020-06-01: 500 mL via INTRAVENOUS

## 2020-06-01 MED ORDER — ACETAMINOPHEN 650 MG RE SUPP: 650.0000 mg | Freq: Four times a day (QID) | RECTAL | Status: DC | PRN

## 2020-06-01 MED ORDER — SODIUM CHLORIDE 0.9 % IV SOLN
1.0000 g | Freq: Once | INTRAVENOUS | Status: AC
  Administered 2020-06-01: 1 g via INTRAVENOUS
  Filled 2020-06-01: qty 10

## 2020-06-01 MED ORDER — LACTATED RINGERS IV SOLN: INTRAVENOUS | Status: DC

## 2020-06-01 MED ORDER — LACTATED RINGERS IV BOLUS
1000.0000 mL | Freq: Once | INTRAVENOUS | Status: AC
  Administered 2020-06-01: 1000 mL via INTRAVENOUS

## 2020-06-01 MED ORDER — SODIUM CHLORIDE 0.9 % IV SOLN
100.0000 mL/h | INTRAVENOUS | Status: DC
  Administered 2020-06-01: 100 mL/h via INTRAVENOUS

## 2020-06-01 MED ORDER — ACETAMINOPHEN 325 MG PO TABS: 650.0000 mg | ORAL_TABLET | Freq: Four times a day (QID) | ORAL | Status: DC | PRN

## 2020-06-01 NOTE — Progress Notes (Signed)
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  CT head showed no evidence of acute intracranial process, including no evidence of bleed.  I have ordered MRI brain as well as serial neurochecks.  Receiving IV fluids, with repeat BMP ordered later this evening for subsequent trending of renal function.  Currently n.p.o. until patient's mental status improves such that he is able to participate in/pass nursing bedside swallow screen.     Babs Bertin, DO Hospitalist

## 2020-06-01 NOTE — ED Triage Notes (Signed)
Pt bib ems from home. C/O fall 05/31/20 around 14:00. Denies LOC and not on thinners. EMS stated pt left side arm is uncoordinated. His feet hurts bilaterally. Pt was found by daughter who is not sure by time of fall. Daughter made ems aware that speech, incontinence, and lack of coordination happened after falling.  BP 140/80 HR 80 NSR Room Air 98% CBG 148

## 2020-06-01 NOTE — H&P (Signed)
History and Physical    PLEASE NOTE THAT DRAGON DICTATION SOFTWARE WAS USED IN THE CONSTRUCTION OF THIS NOTE.   Marvin Morrison XHB:[id number] DOB: October 10, 1940 DOA: 06/01/2020  PCP: Cassandria Anger, MD Patient coming from: home   I have personally briefly reviewed patient's old medical records in [place]  Chief Complaint: Slurred speech  HPI: Marvin Morrison is a 80 y.o. male with medical history significant for hypertension, hyperlipidemia, stage IIIb chronic kidney disease with baseline creatinine 1.5-1.8, urinary retention, chronic anemia with baseline hemoglobin of 12, who is admitted to [facility] on 06/01/2020 with acute kidney injury superimposed on stage IIIb chronic kidney disease after presenting from home to [facility] ED complaining of slurred speech.   In the setting of the patient's current altered mental status, the following history is provided via my discussions with the patient's wife, who was present at bedside, in addition to my discussions with the emergency department physician and via chart review.  Patient's wife reports that at 1400 on 05/31/2020 that the patient fell while ambulating at their home.  While she did not directly witness this fall, she was in the next from over at the time, and upon hearing the patient fall to the floor, was able to get to his within approximately 5 to 10 seconds, at which time she confirmed that the patient was conscious, but that he was exhibiting evidence of new onset slurred speech, without associated evidence of facial droop.  She reports that he also appeared confused relative to his baseline mental status, and she notes that patient was at his baseline level of health leading up to this.  Specifically, she reports that the patient had no acute complaints in the days leading up to this event, including no report of subjective fever, chills, rigors, or generalized myalgias.  He was not complaining of any recent headache, neck  stiffness, sore throat, abdominal pain, diarrhea, or rash.  No recent nausea/vomiting, and the patient's wife feels that the patient was exhibiting his baseline level intake over that timeframe.  No recent traveling or known recent COVID-19 exposures.  She reports that the patient had not been complaining of any recent dysuria, gross materia, or changes in her urinary urgency/frequency, nor any recent chest pain, shortness of breath, palpitations, diaphoresis, dizziness, presyncope, or syncope.  The patient's wife is unsure as to any potential recent modifications to the patient's home medication regimen, which is notable for inclusion of gabapentin.  Medical history notable for stage IIIb chronic disease with baseline creatinine 1.5-1.8, with most recent prior serum creatinine data point noted to be 1.81 03/05/2020.  Additionally, he has a documented history of urinary retention, for which he is prescribed Flomax.  Not on any blood thinners at home.  Wife reports that the patient does not routinely recently drink alcohol, and she reports that he does not engage in recreational drug use.     ED Course:  Vital signs in the ED were notable for the following: Tetramex 98.0, heart rate 84-85, blood pressure 115/69; respiratory rate 16, oxygen saturation 100% on room air.  Labs were notable for the following: CMP was notable for the following: Sodium 135, potassium 3.2, bicarbonate 11, anion gap 19, BUN 123, creatinine 4.77, BUN/creatinine ratio 26, glucose 146, calcium 9.1, albumin 3.0, otherwise, liver enzymes were found to be within normal limits.  CBC notable for white cell count of 24,000, hemoglobin 9.8 relative to most recent prior value of 11.7 on 04/01/2020, with presenting  hemoglobin associated with macrocytic/normochromic findings as well as a nonelevated RDW, platelets 291.  Serum ethanol less than 10.  Urinary drug screen negative.  Urinalysis showed no white blood cells, few bacteria, nitrate  negative, leukocyte Estrace negative, 30 protein, and no RBCs.  Nasopharyngeal COVID-19/influenza PCR were checked in the emergency department today and found to be negative.  EKG shows sinus rhythm with PVC and heart rate 81, left anterior fascicular block, and no evidence of ST/T wave changes, including no evidence of ST elevation.  Noncontrast CT the head showed no evidence of acute intracranial process, including no evidence of intracranial hemorrhage or acute infarct.  CT cervical spine showed no evidence of acute fracture or subluxation of the cervical spine.  Chest x-ray showed left lower lobe airspace opacity suggestive of atelectasis versus infiltrate.  While in the ED, the following were administered: Ativan 0.5 mg IV x1 which was given due to concern for agitation when attempting to pursue CT imaging; lactated Ringer's x1 L bolus, normal saline x500 cc bolus, Rocephin 1 g IV x1.  Subsequently, the patient was admitted to the PCU for further evaluation and management of presenting acute kidney injury superimposed on stage IIIb chronic kidney disease as well as additional evaluation and management of acute encephalopathy, all in the context of presenting slurred speech with altered mental status.    Review of Systems: As per HPI otherwise 10 point review of systems negative.   Past Medical History:  Diagnosis Date  . Arthritis   . Chronic kidney disease   . Colitis   . Diarrhea   . Eczema   . Elevated glucose   . HTN (hypertension)   . Hyperlipidemia   . Pneumonia   . Shingles 2009    Past Surgical History:  Procedure Laterality Date  . LUMBAR LAMINECTOMY/DECOMPRESSION MICRODISCECTOMY N/A 01/11/2016   Procedure: Lumbar two- three, Lumbar three- four, Lumbar four- five, Lumbar five-Sacral one Laminectomy;  Surgeon: Kristeen Miss, MD;  Location: [place];  Service: Neurosurgery;  Laterality: N/A;  L2-3 L3-4 L4-5 L5-S1 Laminectomy  . TONSILLECTOMY     Age 79    Social History:   reports that he has never smoked. He has never used smokeless tobacco. He reports that he does not drink alcohol and does not use drugs.   No Known Allergies  Family History  Problem Relation Age of Onset  . Hypertension Mother   . Hyperlipidemia Father   . Hypertension Father     Family history reviewed and not pertinent    Prior to Admission medications   Medication Sig Start Date End Date Taking? Authorizing Provider  acyclovir (ZOVIRAX) 400 MG tablet Take 1 tablet (400 mg total) by mouth 2 (two) times daily. 04/01/20   Plotnikov, Evie Lacks, MD  doxycycline (VIBRA-TABS) 100 MG tablet TAKE 1 TABLET BY MOUTH TWICE A DAY 01/05/20   Plotnikov, Evie Lacks, MD  furosemide (LASIX) 40 MG tablet Take 1 tablet (40 mg total) by mouth daily as needed for edema. 04/01/20 04/01/21  Plotnikov, Evie Lacks, MD  gabapentin (NEURONTIN) 100 MG capsule TAKE 1 CAPSULE BY MOUTH THREE TIMES A DAY 04/01/20   Plotnikov, Evie Lacks, MD  metoprolol succinate (TOPROL-XL) 50 MG 24 hr tablet TAKE 1 TABLET EVERY DAY. TAKE IMMEDIATELY FOLLOWING A MEAL 04/01/20   Plotnikov, Evie Lacks, MD  Multiple Vitamin (MULTIVITAMIN WITH MINERALS) TABS tablet Take 1 tablet by mouth daily. 02/05/16   Angiulli, Lavon Paganini, PA-C  mupirocin ointment (BACTROBAN) 2 % Use bid 04/06/20  Plotnikov, Evie Lacks, MD  ondansetron (ZOFRAN) 4 MG tablet TAKE 1 TABLET BY MOUTH EVERY DAY AS NEEDED FOR NAUSEA OR VOMITING 05/05/20   Plotnikov, Evie Lacks, MD  pantoprazole (PROTONIX) 40 MG tablet Take 1 tablet (40 mg total) by mouth daily. 04/01/20   Plotnikov, Evie Lacks, MD  tamsulosin (FLOMAX) 0.4 MG CAPS capsule TAKE 1 CAPSULE BY MOUTH EVERY DAY 04/01/20   Plotnikov, Evie Lacks, MD  triamcinolone ointment (KENALOG) 0.1 % Use bid 04/01/20   Plotnikov, Evie Lacks, MD     Objective    Physical Exam: Vitals:   06/01/20 1440 06/01/20 1446 06/01/20 1448 06/01/20 1645  BP: 115/69   (!) 154/143  Pulse: 85   84  Resp: 16   16  Temp:   98 F (36.7 C)   TempSrc:   Rectal    SpO2: 100%   100%  Weight:  81.6 kg    Height:  6' (1.829 m)      General: appears to be stated age; somnolent, will briefly open eyes to verbal stimuli, unable to follow instructions.  Skin: warm, dry; small abrasion to the dorsal aspect of the right second toe with hemostasis noted. Head:  AT/[place] Mouth:  Oral mucosa membranes appear dry, normal dentition Neck: supple; trachea midline Heart:  RRR; did not appreciate any M/R/G Lungs: CTAB, did not appreciate any wheezes, rales, or rhonchi Abdomen: + BS; soft, ND Vascular: 2+ pedal pulses b/l; 2+ radial pulses b/l Extremities: no peripheral edema, no muscle wasting Neuro:  In the setting of the patient's current mental status and associated inability to follow instructions, unable to perform full neurologic exam at this time.  As such, assessment of strength, sensation, and cranial nerves is limited at this time. Patient noted to spontaneously move all 4 extremities. No tremors.     Labs on Admission: I have personally reviewed following labs and imaging studies  CBC: Recent Labs  Lab 06/01/20 1455  WBC 23.9*  NEUTROABS 20.4*  HGB 9.8*  HCT 28.7*  MCV 102.5*  PLT 518   Basic Metabolic Panel: Recent Labs  Lab 06/01/20 1455  NA 135  K 3.2*  CL 105  CO2 11*  GLUCOSE 146*  BUN 123*  CREATININE 4.77*  CALCIUM 9.1   GFR: Estimated Creatinine Clearance: 13.3 mL/min (A) (by C-G formula based on SCr of 4.77 mg/dL (H)). Liver Function Tests: Recent Labs  Lab 06/01/20 1455  AST 14*  ALT 15  ALKPHOS 51  BILITOT 0.7  PROT 6.3*  ALBUMIN 3.0*   No results for input(s): LIPASE, AMYLASE in the last 168 hours. No results for input(s): AMMONIA in the last 168 hours. Coagulation Profile: Recent Labs  Lab 06/01/20 1509  INR 1.4*   Cardiac Enzymes: No results for input(s): CKTOTAL, CKMB, CKMBINDEX, TROPONINI in the last 168 hours. BNP (last 3 results) No results for input(s): PROBNP in the last 8760 hours. HbA1C: No  results for input(s): HGBA1C in the last 72 hours. CBG: No results for input(s): GLUCAP in the last 168 hours. Lipid Profile: No results for input(s): CHOL, HDL, LDLCALC, TRIG, CHOLHDL, LDLDIRECT in the last 72 hours. Thyroid Function Tests: No results for input(s): TSH, T4TOTAL, FREET4, T3FREE, THYROIDAB in the last 72 hours. Anemia Panel: No results for input(s): VITAMINB12, FOLATE, FERRITIN, TIBC, IRON, RETICCTPCT in the last 72 hours. Urine analysis:    Component Value Date/Time   COLORURINE YELLOW 06/01/2020 1455   APPEARANCEUR HAZY (A) 06/01/2020 1455   LABSPEC 1.013 06/01/2020 1455  PHURINE 5.0 06/01/2020 [place] 06/01/2020 [place] 03/27/2019 1437   [place] 06/01/2020 1455   [place] 06/01/2020 1455   [place] 06/01/2020 1455   PROTEINUR 30 (A) 06/01/2020 1455   UROBILINOGEN 0.2 03/27/2019 1437   NITRITE NEGATIVE 06/01/2020 1455   LEUKOCYTESUR NEGATIVE 06/01/2020 1455    Radiological Exams on Admission: CT Head Wo Contrast  Result Date: 06/01/2020 CLINICAL DATA:  Fall yesterday EXAM: CT HEAD WITHOUT CONTRAST CT CERVICAL SPINE WITHOUT CONTRAST TECHNIQUE: Multidetector CT imaging of the head and cervical spine was performed following the standard protocol without intravenous contrast. Multiplanar CT image reconstructions of the cervical spine were also generated. COMPARISON:  None. FINDINGS: CT HEAD FINDINGS Brain: No evidence of acute infarction, hemorrhage, hydrocephalus, extra-axial collection or mass lesion/mass effect. Periventricular white matter hypodensity. Vascular: No hyperdense vessel or unexpected calcification. Skull: Normal. Negative for fracture or focal lesion. Sinuses/Orbits: No acute finding. Other: None. CT CERVICAL SPINE FINDINGS Alignment: Normal. Skull base and vertebrae: No acute fracture. No primary bone lesion or focal pathologic process. Soft tissues and spinal canal: No prevertebral fluid or  swelling. No visible canal hematoma. Disc levels: Severe disc space height and osteophytosis of C4 through C6. Upper chest: Negative. Other: None. IMPRESSION: 1. No acute intracranial pathology. Small-vessel white matter disease. 2. No fracture or static subluxation of the cervical spine. 3. Severe disc space height and osteophytosis of C4 through C6. Electronically Signed   By: Eddie Candle M.D.   On: 06/01/2020 15:32   CT Cervical Spine Wo Contrast  Result Date: 06/01/2020 CLINICAL DATA:  Fall yesterday EXAM: CT HEAD WITHOUT CONTRAST CT CERVICAL SPINE WITHOUT CONTRAST TECHNIQUE: Multidetector CT imaging of the head and cervical spine was performed following the standard protocol without intravenous contrast. Multiplanar CT image reconstructions of the cervical spine were also generated. COMPARISON:  None. FINDINGS: CT HEAD FINDINGS Brain: No evidence of acute infarction, hemorrhage, hydrocephalus, extra-axial collection or mass lesion/mass effect. Periventricular white matter hypodensity. Vascular: No hyperdense vessel or unexpected calcification. Skull: Normal. Negative for fracture or focal lesion. Sinuses/Orbits: No acute finding. Other: None. CT CERVICAL SPINE FINDINGS Alignment: Normal. Skull base and vertebrae: No acute fracture. No primary bone lesion or focal pathologic process. Soft tissues and spinal canal: No prevertebral fluid or swelling. No visible canal hematoma. Disc levels: Severe disc space height and osteophytosis of C4 through C6. Upper chest: Negative. Other: None. IMPRESSION: 1. No acute intracranial pathology. Small-vessel white matter disease. 2. No fracture or static subluxation of the cervical spine. 3. Severe disc space height and osteophytosis of C4 through C6. Electronically Signed   By: Eddie Candle M.D.   On: 06/01/2020 15:32   DG Chest Portable 1 View  Result Date: 06/01/2020 CLINICAL DATA:  80 year old male with weakness. EXAM: PORTABLE CHEST 1 VIEW COMPARISON:  Chest  radiograph dated 04/24/2017. FINDINGS: Shallow inspiration with left lung base atelectasis or infiltrate. There is no pleural effusion pneumothorax. The cardiac silhouette is within limits. Atherosclerotic calcification of the aorta. Degenerative changes of spine. No acute osseous pathology. IMPRESSION: Shallow inspiration with left lung base atelectasis or infiltrate. Electronically Signed   By: Anner Crete M.D.   On: 06/01/2020 18:09     EKG: Independently reviewed, with result as described above.    Assessment/Plan    In the setting of the patient's current mental status and associated inability to follow instructions, unable to perform full neurologic exam at this time.  As such, assessment of strength,  sensation, and cranial nerves is limited at this time. Patient noted to spontaneously move all 4 extremities. No tremors.    Principal Problem:   AKI (acute kidney injury) ([place]) Active Problems:   Hyperlipidemia   Essential hypertension   Urinary retention   Acute encephalopathy   High anion gap metabolic acidosis   Leukocytosis   Acute on chronic anemia   GERD (gastroesophageal reflux disease)     #) Acute Kidney Injury superimposed on stage IIIb chronic kidney disease: In the setting of baseline creatinine of 1.5-1.8, with most recent prior serum creatinine noted to be 1.82 on 03/05/2020, presenting serum creatinine noted to be 4.77.  Etiology is currently not completely clear, however differential includes urinary retention in the setting of a documented history of such versus rhabdomyolysis given the patient's recent fall in the setting of presenting anion gap metabolic acidosis, versus prerenal contributions from potential underlying infection in the form of possible left lower lobe pneumonia given presenting chest x-ray showing evidence of airspace opacity representing atelectasis versus infiltrate.  Urinalysis with microscopy was notable for no white blood cells, 0-5 red blood  cells, and 30 protein, without reported evidence of additional urinary casts.  Random urine sodium and random urine creatinine to calculate FeNa for further evaluation and discernment of prerenal etiology versus ATN.  Additionally, Foley catheter placement is been ordered, to alleviate any potential postrenal contribution in the setting of a documented history of urinary retention.  We will provide additional lactated Ringer's, with repeat BMP ordered later today.  Of note, presenting acute kidney injury is associated with presenting serum potassium of 3.2 and no evidence of acute volume overload at this time.  Presenting labs are also associated with anion gap metabolic acidosis, although this may not be exclusively on the basis of his AKI, as further detailed below.  His presenting acute encephalopathy, may represent uremia, although there are multiple other potential contributing factors that may also be playing a role.  Overall, additional IV fluids, with close monitoring of ensuing BMP results, including trend in serum creatinine as well as serum potassium level in addition to monitoring current anion gap metabolic acidosis.  If no ensuing improvement in renal function with these measures, consider consulting nephrology.    Plan: monitor strict I's & O's and daily weights. Attempt to avoid nephrotoxic agents. Refrain from NSAIDs. Repeat BMP, as above.  Check serum magnesium level. Check random urine sodium and random urine creatinine.  Lactated Ringer's at 100 cc/h.  Check CPK.  Check serum phosphorus level.  Place Foley catheter, as above. consider obtaining a renal US to evaluate for parenchymal abnormality as well as to further assess for post-renal obstructive process if renal function does not improve with the above measures.      #) Acute encephalopathy: 1 day of confusion starting at 1400 on 05/31/2020, and associated with slurred speech, and somnolence.  Suspect at least a partial contribution  from metabolic encephalopathy given acute kidney injury superimposed on CKD, as well as pharmacologic ramification on impaired renal clearance of gabapentin as a consequence.  Additional potential metabolic contribution from possible left lower lobe pneumonia, as further detailed below.  No overt evidence of additional infectious process at this time, including negative COVID, and urinalysis that was found to be inconsistent with UTI.  Patient does not appear septic.  Additional evaluation of potential additional metabolic contributions via extension of laboratory evaluation, as detailed below.  In the context of slurred speech, differential also includes possibility of acute  ischemic stroke.  Presenting noncontrast CT head showed no evidence of acute intracranial process, including no evidence of intracranial hemorrhage and no evidence of acute ischemic infarct.  Reassuringly, the patient appears to be spontaneously moving all 4 extremities.  Of note, given the onset of his symptoms at 1400 on 05/31/2020 relative to the timing of his presentation to the emergency department today at 1426, the patient is outside of the window for tPA administration as well as thrombectomy.  We will further evaluate this possibility with MRI brain.     Plan: Further evaluation management of AKI superimposed on stage IIIb chronic kidney disease, including interval IV fluids, as above, with repeat BMP as noted above.  N.p.o. pending successful completion of order for nursing bedside swallow screen.  Further evaluation management of potential left lower lobe pneumonia, as further described below.  Repeat CMP in the morning.  Check TSH, MMA, VBG, ammonia level.  Check MRI of the brain, as above.  Every 4 hour neurochecks.  Fall precautions ordered.      #) Left basilar airspace opacity: Potentially representing atelectasis, in the setting of potential suboptimal respiratory effort given the patient's current degree of acute  encephalopathy versus infiltrate.  While the patient is not overtly septic, he possesses a presenting leukocytosis of 24,000, and in the context of presenting acute encephalopathy of unclear etiology, will treat this radiographic finding as if it represents community-acquired pneumonia, while expanding evaluation for acute encephalopathy, as further described above.  Of note, blood cultures x2 were collected in the ED, and the patient received 1 dose of IV Rocephin.   Plan: Follow-up results blood cultures x2.  IV Rocephin.  IV azithromycin.  Monitor on continuous pulse oximetry.  Repeat CBC in the morning.  Check procalcitonin.      #) Leukocytosis: Presenting white blood cell count of 24,000.  As this represents sole presenting SIRS criteria, criteria for sepsis not met at this time, although there is the possibility of his left basilar pneumonia, as further detailed above.  No additional evidence of underlying infectious process identified at this time.  Of note, no evidence of diarrhea to suggest underlying C. Difficile. noninfectious differential to explain presenting leukocytosis includes inflammatory response in the setting of recent ground-level mechanical fall as well as an element of hemoconcentration in the setting of limited oral intake over the course of the last day.  We will also check peripheral smear.    Plan: Continuous lactated Ringer's, as above.  Repeat CBC with differential in the morning.  Work-up and management of left basilar airspace opacity, as above.  Peripheral smear.     #) Hypokalemia: Presenting serum potassium noted to be 3.2.  In the setting of presenting acute kidney injury, will refrain from potassium supplementation at this time in order to reduce risk for ensuing development of hyperkalemia, but rather provide continuous lactated Ringer's, as above, with close monitoring of ensuing renal function.  Plan: IV fluids, as above.  Repeat BMP.  Monitor on symmetry.   Add on serum magnesium level.      #) Anion gap metabolic acidosis: Presenting serum bicarbonate noted to be 11, while anion gap found to be 19.  Contribution from AKI on CKD 3B, as above.  No evidence of DKA, in the absence of an elevated blood sugar, no open history of underlying diabetes.  Differential also includes rhabdomyolysis given yesterday's fall, and unclear amount of time on the floor following this fall.  We will also check salicylate level  and INR to evaluate hepatic synthetic function.  Criteria are not met for sepsis at this time, as further detailed above.  Plan: Work-up and management of acute kidney injury, as detailed above.  Check INR, CPK, and salicylate level.  We will also check VBG to evaluate if the patient's acidosis is also associated with acidemia.  Repeat CMP in the morning.     #) Acute on chronic anemia: In the setting of chronic anemia with baseline hemoglobin of approximately 12, with most recent prior hemoglobin noted to be 11.7 on 03/05/2020, presenting hemoglobin found to be 9.8.Marland Kitchen  This is associated with macrocytic findings as well as normochromic findings and nonelevated RDW, thereby less suggestive of acute blood loss anemia.  Of note, patient's wife and conveying that the patient has no history of alcohol abuse.  Check MMA and folic acid level to further evaluate the macrocytic component to his presenting anemia, also check INR to evaluate hepatic synthetic function.   Plan: We will add on the following labs to his previously collected specimen: MMA, folic acid, iron studies, reticulocyte count, peripheral smear.  Check INR.  Repeat CBC in the morning.      #) Essential hypertension: Documented history of such, with patient's home antihypertensive NSAID regimen including Toprol-XL.  Additionally, his Flomax for BPH can also pose antihypertensive ramifications.  Blood pressure has been normotensive in the ED and thus far.  Plan: In the setting of current  n.p.o. status, will hold home Toprol-XL as well as Flomax for now.      #) GERD: Documented history of such, with the patient prescribed Protonix as an outpatient.  In the setting of presenting acute encephalopathy, with PPIs offering a potential side effect of confusion, will hold home Protonix for now.  Plan: Hold home Protonix for now, as above.     DVT prophylaxis: SCDs Code Status: Full code Family Communication: I discussed the patient's case with his wife, who was present at bedside Disposition Plan: Per Rounding Team Consults called: none  Admission status: Inpatient; PCU     Of note, this patient was added by me to the following Admit List/Treatment Team: [email or web address].      PLEASE NOTE THAT DRAGON DICTATION SOFTWARE WAS USED IN THE CONSTRUCTION OF THIS NOTE.   [place] [facility] Pager [phone] From [place]  Otherwise, please contact night-coverage  [email or web address] Password [facility]   06/01/2020, 7:32 PM

## 2020-06-01 NOTE — ED Notes (Signed)
Pt transferred to CT.

## 2020-06-01 NOTE — ED Notes (Signed)
Attempted to call report to 5W. RN unable to take report at this time. Number given for call back.

## 2020-06-01 NOTE — ED Notes (Signed)
Pt transported back to room

## 2020-06-01 NOTE — ED Notes (Signed)
Wife is at the bedside Pam.

## 2020-06-01 NOTE — ED Notes (Signed)
Pt with worsening of slurred speech. Pt becoming more agitated throughout the day.

## 2020-06-01 NOTE — ED Provider Notes (Signed)
Emergency Medicine Provider Triage Evaluation Note  Marvin Morrison , a 80 y.o. male  was evaluated in triage.  Pt complains of slurred speech after a fall.  Patient reportedly had a fall yesterday.  EMS states left side is uncoordinated.  Daughter found patient, does not know when the fall happened.  Upon EMS arrival, patient was covered in dried stool.  Pt's only complaint is his speech is different than normal  Review of Systems  Positive: Speech difficulty Negative: Blood thinner  Physical Exam  BP 115/69 (BP Location: Left Arm)   Pulse 85   Temp 98 F (36.7 C) (Rectal)   Resp 16   Ht 6' (1.829 m)   Wt 81.6 kg   SpO2 100%   BMI 24.41 kg/m  Gen:   Awake, no distress   Resp:  Normal effort  MSK:   Moves extremities, overall uncoordinated movements Other:  Rash noted.   Medical Decision Making  Medically screening exam initiated at 2:56 PM.  Appropriate orders placed.  TAEDYN GLASSCOCK was informed that the remainder of the evaluation will be completed by another provider, this initial triage assessment does not replace that evaluation, and the importance of remaining in the ED until their evaluation is complete.  Labs and imaging ordered   Franchot Heidelberg, PA-C 06/01/20 1501    Gareth Morgan, MD 06/02/20 1446

## 2020-06-01 NOTE — ED Notes (Signed)
Pt has scabs on bilateral cheeks, blanching on the sacral region, and flakiness on left foot w/bilateral wounds on both feet. Thick buildup through out hair

## 2020-06-01 NOTE — ED Notes (Signed)
Pt stated unable to complete swallow screen. He said he felt as if he couldn't swallow.

## 2020-06-01 NOTE — ED Provider Notes (Signed)
[facility] EMERGENCY DEPARTMENT Provider Note   CSN: [id number] Arrival date & time: 06/01/20  1426     History Chief Complaint  Patient presents with  . Fall    Marvin Morrison is a 80 y.o. male.  HPI   Patient presented to the ED for evaluation of slurred speech after a fall yesterday.  History was provided by the patient and his daughter.  Patient fell when trying to transfer to a wheelchair yesterday at about 2 PM.  Patient did not strike his head.  Since that time the patient has had difficulty with his speech.  It is slurred and this is not normal for him.  He also was incontinent and he seems less coordinated than usual.  Patient at baseline does have some difficulty ambulating because of some prior spinal issues but his appearance today and speech is very different than his baseline.  No known fevers.  No vomiting or diarrhea.  No fevers or chills.  Patient does have some issues with chronic wounds on left foot.  He has been seeing his doctor.  Does not any different than usual  Past Medical History:  Diagnosis Date  . Arthritis   . Chronic kidney disease   . Colitis   . Diarrhea   . Eczema   . Elevated glucose   . HTN (hypertension)   . Hyperlipidemia   . Pneumonia   . Shingles 2009    Patient Active Problem List   Diagnosis Date Noted  . Noncompliance by refusing service 04/05/2020  . Ingrowing toenail with infection 12/12/2019  . Foot ulcer with fat layer exposed, right ([place]) 09/18/2019  . No CPR or defibrillation, use all other resuscitative measures 09/18/2019  . Noncompliance 03/25/2017  . Neoplasm of uncertain behavior of skin 08/16/2016  . Loose stools   . Hyperkalemia   . Acute lower UTI   . AKI (acute kidney injury) ([place])   . Sleep disturbance   . Labile blood pressure   . Neuropathic pain   . Dysuria   . Hypoalbuminemia due to protein-calorie malnutrition ([place])   . Lymphocytosis   . Hyperglycemia   . Radiculopathy 01/13/2016   . Surgery, elective   . Post-operative pain   . Acute blood loss anemia   . Stage 3 chronic kidney disease ([place])   . Urinary retention   . Hypokalemia   . Constipation due to pain medication   . Pressure injury of skin 01/12/2016  . Malnutrition of moderate degree 01/12/2016  . Lumbar stenosis 01/11/2016  . Gait disorder 11/18/2015  . Spinal stenosis of lumbar region 10/28/2015  . Nausea with vomiting 04/19/2014  . Loss of weight 05/18/2012  . Well adult exam 05/13/2011  . 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 Anemia, iron deficiency 04/06/2010  . DIARRHEA, CHRONIC 03/27/2009  . 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 Hyperlipidemia 02/01/2008  . ABNORMAL GLUCOSE NEC 02/01/2008  . Weakness 11/19/2007  . SHINGLES 11/17/2007  . Essential hypertension 11/09/2006    Past Surgical History:  Procedure Laterality Date  . LUMBAR LAMINECTOMY/DECOMPRESSION MICRODISCECTOMY N/A 01/11/2016   Procedure: Lumbar two- three, Lumbar three- four, Lumbar four- five, Lumbar five-Sacral one Laminectomy;  Surgeon: Kristeen Miss, MD;  Location: [facility];  Service: Neurosurgery;  Laterality: N/A;  L2-3 L3-4 L4-5 L5-S1 Laminectomy  . TONSILLECTOMY     Age 66       Family History  Problem Relation Age of Onset  . Hypertension Mother   . Hyperlipidemia Father   .  Hypertension Father     Social History   Tobacco Use  . Smoking status: Never Smoker  . Smokeless tobacco: Never Used  Substance Use Topics  . Alcohol use: No  . Drug use: No    Home Medications Prior to Admission medications   Medication Sig Start Date End Date Taking? Authorizing Provider  acyclovir (ZOVIRAX) 400 MG tablet Take 1 tablet (400 mg total) by mouth 2 (two) times daily. 04/01/20   Plotnikov, Evie Lacks, MD  doxycycline (VIBRA-TABS) 100 MG tablet TAKE 1 TABLET BY MOUTH TWICE A DAY 01/05/20   Plotnikov, Evie Lacks, MD  furosemide (LASIX) 40 MG tablet Take 1 tablet (40 mg total) by mouth daily as needed for edema. 04/01/20  04/01/21  Plotnikov, Evie Lacks, MD  gabapentin (NEURONTIN) 100 MG capsule TAKE 1 CAPSULE BY MOUTH THREE TIMES A DAY 04/01/20   Plotnikov, Evie Lacks, MD  metoprolol succinate (TOPROL-XL) 50 MG 24 hr tablet TAKE 1 TABLET EVERY DAY. TAKE IMMEDIATELY FOLLOWING A MEAL 04/01/20   Plotnikov, Evie Lacks, MD  Multiple Vitamin (MULTIVITAMIN WITH MINERALS) TABS tablet Take 1 tablet by mouth daily. 02/05/16   Angiulli, Lavon Paganini, PA-C  mupirocin ointment (BACTROBAN) 2 % Use bid 04/06/20   Plotnikov, Evie Lacks, MD  ondansetron (ZOFRAN) 4 MG tablet TAKE 1 TABLET BY MOUTH EVERY DAY AS NEEDED FOR NAUSEA OR VOMITING 05/05/20   Plotnikov, Evie Lacks, MD  pantoprazole (PROTONIX) 40 MG tablet Take 1 tablet (40 mg total) by mouth daily. 04/01/20   Plotnikov, Evie Lacks, MD  tamsulosin (FLOMAX) 0.4 MG CAPS capsule TAKE 1 CAPSULE BY MOUTH EVERY DAY 04/01/20   Plotnikov, Evie Lacks, MD  triamcinolone ointment (KENALOG) 0.1 % Use bid 04/01/20   Plotnikov, Evie Lacks, MD    Allergies    Patient has no known allergies.  Review of Systems   Review of Systems  All other systems reviewed and are negative.   Physical Exam Updated Vital Signs BP (!) 154/143   Pulse 84   Temp 98 F (36.7 C) (Rectal)   Resp 16   Ht 1.829 m (6')   Wt 81.6 kg   SpO2 100%   BMI 24.41 kg/m   Physical Exam Vitals and nursing note reviewed.  Constitutional:      Appearance: He is well-developed. He is ill-appearing.     Comments: Elderly, frail  HENT:     Head: Normocephalic and atraumatic.     Right Ear: External ear normal.     Left Ear: External ear normal.  Eyes:     General: No scleral icterus.       Right eye: No discharge.        Left eye: No discharge.     Conjunctiva/sclera: Conjunctivae normal.  Neck:     Trachea: No tracheal deviation.  Cardiovascular:     Rate and Rhythm: Normal rate and regular rhythm.  Pulmonary:     Effort: Pulmonary effort is normal. No respiratory distress.     Breath sounds: Normal breath sounds. No  stridor. No wheezing or rales.  Abdominal:     General: Bowel sounds are normal. There is no distension.     Palpations: Abdomen is soft.     Tenderness: There is no abdominal tenderness. There is no guarding or rebound.  Musculoskeletal:        General: No tenderness.     Cervical back: Neck supple. No tenderness.     Thoracic back: No tenderness.     Lumbar back: No  tenderness.  Skin:    General: Skin is warm and dry.     Findings: Rash present.     Comments: Mildly scaling skin noted in the scalp face and feet, no vesicular lesions, erythema and breakdown of the epidermis of the face bilaterally  Neurological:     Mental Status: He is alert.     Cranial Nerves: No cranial nerve deficit (no facial droop, extraocular movements intact, slurred speech , dysarthric).     Sensory: No sensory deficit.     Motor: Weakness present. No tremor, abnormal muscle tone or seizure activity.     Comments: Able to lift both arms and legs off the bed     ED Results / Procedures / Treatments   Labs (all labs ordered are listed, but only abnormal results are displayed) Labs Reviewed  CBC WITH DIFFERENTIAL/PLATELET - Abnormal; Notable for the following components:      Result Value   WBC 23.9 (*)    RBC 2.80 (*)    Hemoglobin 9.8 (*)    HCT 28.7 (*)    MCV 102.5 (*)    MCH 35.0 (*)    Neutro Abs 20.4 (*)    Monocytes Absolute 1.9 (*)    Abs Immature Granulocytes 0.20 (*)    All other components within normal limits  COMPREHENSIVE METABOLIC PANEL - Abnormal; Notable for the following components:   Potassium 3.2 (*)    CO2 11 (*)    Glucose, Bld 146 (*)    BUN 123 (*)    Creatinine, Ser 4.77 (*)    Total Protein 6.3 (*)    Albumin 3.0 (*)    AST 14 (*)    GFR, Estimated 12 (*)    Anion gap 19 (*)    All other components within normal limits  PROTIME-INR - Abnormal; Notable for the following components:   Prothrombin Time 16.8 (*)    INR 1.4 (*)    All other components within normal  limits  POC OCCULT BLOOD, ED - Abnormal; Notable for the following components:   Fecal Occult Bld POSITIVE (*)    All other components within normal limits  RESP PANEL BY RT-PCR (FLU A&B, COVID) ARPGX2  CULTURE, BLOOD (ROUTINE X 2)  CULTURE, BLOOD (ROUTINE X 2)  ETHANOL  APTT  URINALYSIS, ROUTINE W REFLEX MICROSCOPIC  RAPID URINE DRUG SCREEN, HOSP PERFORMED    EKG EKG Interpretation  Date/Time:  Monday Jun 01 2020 15:45:25 EDT Ventricular Rate:  81 PR Interval:  171 QRS Duration: 119 QT Interval:  402 QTC Calculation: 467 R Axis:   -55 Text Interpretation: Sinus rhythm Paired ventricular premature complexes Left anterior fascicular block Low voltage, precordial leads Confirmed by Dorie Rank [phone]) on 06/01/2020 5:20:58 PM   Radiology CT Head Wo Contrast  Result Date: 06/01/2020 CLINICAL DATA:  Fall yesterday EXAM: CT HEAD WITHOUT CONTRAST CT CERVICAL SPINE WITHOUT CONTRAST TECHNIQUE: Multidetector CT imaging of the head and cervical spine was performed following the standard protocol without intravenous contrast. Multiplanar CT image reconstructions of the cervical spine were also generated. COMPARISON:  None. FINDINGS: CT HEAD FINDINGS Brain: No evidence of acute infarction, hemorrhage, hydrocephalus, extra-axial collection or mass lesion/mass effect. Periventricular white matter hypodensity. Vascular: No hyperdense vessel or unexpected calcification. Skull: Normal. Negative for fracture or focal lesion. Sinuses/Orbits: No acute finding. Other: None. CT CERVICAL SPINE FINDINGS Alignment: Normal. Skull base and vertebrae: No acute fracture. No primary bone lesion or focal pathologic process. Soft tissues and spinal canal: No prevertebral  fluid or swelling. No visible canal hematoma. Disc levels: Severe disc space height and osteophytosis of C4 through C6. Upper chest: Negative. Other: None. IMPRESSION: 1. No acute intracranial pathology. Small-vessel white matter disease. 2. No fracture  or static subluxation of the cervical spine. 3. Severe disc space height and osteophytosis of C4 through C6. Electronically Signed   By: Eddie Candle M.D.   On: 06/01/2020 15:32   CT Cervical Spine Wo Contrast  Result Date: 06/01/2020 CLINICAL DATA:  Fall yesterday EXAM: CT HEAD WITHOUT CONTRAST CT CERVICAL SPINE WITHOUT CONTRAST TECHNIQUE: Multidetector CT imaging of the head and cervical spine was performed following the standard protocol without intravenous contrast. Multiplanar CT image reconstructions of the cervical spine were also generated. COMPARISON:  None. FINDINGS: CT HEAD FINDINGS Brain: No evidence of acute infarction, hemorrhage, hydrocephalus, extra-axial collection or mass lesion/mass effect. Periventricular white matter hypodensity. Vascular: No hyperdense vessel or unexpected calcification. Skull: Normal. Negative for fracture or focal lesion. Sinuses/Orbits: No acute finding. Other: None. CT CERVICAL SPINE FINDINGS Alignment: Normal. Skull base and vertebrae: No acute fracture. No primary bone lesion or focal pathologic process. Soft tissues and spinal canal: No prevertebral fluid or swelling. No visible canal hematoma. Disc levels: Severe disc space height and osteophytosis of C4 through C6. Upper chest: Negative. Other: None. IMPRESSION: 1. No acute intracranial pathology. Small-vessel white matter disease. 2. No fracture or static subluxation of the cervical spine. 3. Severe disc space height and osteophytosis of C4 through C6. Electronically Signed   By: Eddie Candle M.D.   On: 06/01/2020 15:32    Procedures .Critical Care Performed by: Dorie Rank, MD Authorized by: Dorie Rank, MD   Critical care provider statement:    Critical care time (minutes):  45   Critical care was time spent personally by me on the following activities:  Discussions with consultants, evaluation of patient's response to treatment, examination of patient, ordering and performing treatments and  interventions, ordering and review of laboratory studies, ordering and review of radiographic studies, pulse oximetry, re-evaluation of patient's condition, obtaining history from patient or surrogate and review of old charts     Medications Ordered in ED Medications  sodium chloride 0.9 % bolus 500 mL (500 mLs Intravenous New Bag/Given 06/01/20 1544)    Followed by  0.9 %  sodium chloride infusion (100 mL/hr Intravenous New Bag/Given 06/01/20 1546)  lactated ringers bolus 1,000 mL (has no administration in time range)  cefTRIAXone (ROCEPHIN) 1 g in sodium chloride 0.9 % 100 mL IVPB (has no administration in time range)  LORazepam (ATIVAN) injection 0.5 mg (0.5 mg Intravenous Given 06/01/20 1736)    ED Course  I have reviewed the triage vital signs and the nursing notes.  Pertinent labs & imaging results that were available during my care of the patient were reviewed by me and considered in my medical decision making (see chart for details).  Clinical Course as of 06/01/20 1810  Mon Jun 01, 2020  1643 White blood cell count elevated to 23 [JK]  1643 Hemoglobin decreased at 9.8 [JK]  1643 BUN and creatinine elevated.  New compared to previous values [JK]  1719 Head CT does not show any acute findings. [JK]  1720 Cervical spine CT without acute injury [JK]  1808 Stools are guaiac positive.   [VP]  7106 Preliminary review of chest x-ray.  No obvious focal infiltrate [JK]    Clinical Course User Index [JK] Dorie Rank, MD   MDM Rules/Calculators/A&P  Patient presented to the ED for evaluation of weakness and slurred speech.   Patient appeared ill overall.  He does have a chronic skin condition with significant scaling.  Patient's labs were notable for an elevated white blood cell count.  He also has evidence of acute kidney injury with significant uremia.  Possible this may be causing some of the confusion and slurred speech.  Patient is more anemic than his  baseline and he is fecal occult positive.  He appears to have a component of GI bleeding.  He has remained hemodynamically stable.  There was a component of urinary retention as he had approximately 400 cc on the bladder scan.  Foley catheter was inserted.  Patient's head CT and C-spine CT did not show any acute findings.  Chest x-ray no definite pneumonia.  Does have leukocytosis so he was covered with dose of antibiotics pending the urinalysis.  I have ordered IV hydration as there could be a significant prerenal component.  I will consult the medical service for admission and further treatment Final Clinical Impression(s) / ED Diagnoses Final diagnoses:  AKI (acute kidney injury) ([place])  Gastrointestinal hemorrhage, unspecified gastrointestinal hemorrhage type  Uremia  Dysarthria      Dorie Rank, MD 06/01/20 1811

## 2020-06-02 ENCOUNTER — Inpatient Hospital Stay (HOSPITAL_COMMUNITY): Payer: Medicare Other

## 2020-06-02 DIAGNOSIS — E8729 Other acidosis: Secondary | ICD-10-CM | POA: Diagnosis present

## 2020-06-02 DIAGNOSIS — E872 Acidosis: Secondary | ICD-10-CM | POA: Diagnosis present

## 2020-06-02 DIAGNOSIS — R195 Other fecal abnormalities: Secondary | ICD-10-CM

## 2020-06-02 DIAGNOSIS — G9340 Encephalopathy, unspecified: Secondary | ICD-10-CM | POA: Diagnosis present

## 2020-06-02 DIAGNOSIS — D649 Anemia, unspecified: Secondary | ICD-10-CM | POA: Diagnosis present

## 2020-06-02 DIAGNOSIS — D72829 Elevated white blood cell count, unspecified: Secondary | ICD-10-CM | POA: Diagnosis present

## 2020-06-02 DIAGNOSIS — K219 Gastro-esophageal reflux disease without esophagitis: Secondary | ICD-10-CM

## 2020-06-02 DIAGNOSIS — D638 Anemia in other chronic diseases classified elsewhere: Secondary | ICD-10-CM

## 2020-06-02 LAB — COMPREHENSIVE METABOLIC PANEL
ALT: 16 U/L (ref 0–44)
AST: 15 U/L (ref 15–41)
Albumin: 2.8 g/dL — ABNORMAL LOW (ref 3.5–5.0)
Alkaline Phosphatase: 48 U/L (ref 38–126)
Anion gap: 18 — ABNORMAL HIGH (ref 5–15)
BUN: 111 mg/dL — ABNORMAL HIGH (ref 8–23)
CO2: 12 mmol/L — ABNORMAL LOW (ref 22–32)
Calcium: 8.3 mg/dL — ABNORMAL LOW (ref 8.9–10.3)
Chloride: 105 mmol/L (ref 98–111)
Creatinine, Ser: 4.41 mg/dL — ABNORMAL HIGH (ref 0.61–1.24)
GFR, Estimated: 13 mL/min — ABNORMAL LOW (ref >60)
Glucose, Bld: 147 mg/dL — ABNORMAL HIGH (ref 70–99)
Potassium: 2.8 mmol/L — ABNORMAL LOW (ref 3.5–5.1)
Sodium: 135 mmol/L (ref 135–145)
Total Bilirubin: 0.5 mg/dL (ref 0.3–1.2)
Total Protein: 5.7 g/dL — ABNORMAL LOW (ref 6.5–8.1)

## 2020-06-02 LAB — CBC WITH DIFFERENTIAL/PLATELET
Abs Immature Granulocytes: 0.21 10*3/uL — ABNORMAL HIGH (ref 0.00–0.07)
Basophils Absolute: 0.1 10*3/uL (ref 0.0–0.1)
Basophils Relative: 0 %
Eosinophils Absolute: 0.2 10*3/uL (ref 0.0–0.5)
Eosinophils Relative: 1 %
HCT: 27.7 % — ABNORMAL LOW (ref 39.0–52.0)
Hemoglobin: 9.7 g/dL — ABNORMAL LOW (ref 13.0–17.0)
Immature Granulocytes: 1 %
Lymphocytes Relative: 4 %
Lymphs Abs: 0.8 10*3/uL (ref 0.7–4.0)
MCH: 34.9 pg — ABNORMAL HIGH (ref 26.0–34.0)
MCHC: 35 g/dL (ref 30.0–36.0)
MCV: 99.6 fL (ref 80.0–100.0)
Monocytes Absolute: 1.7 10*3/uL — ABNORMAL HIGH (ref 0.1–1.0)
Monocytes Relative: 8 %
Neutro Abs: 17.3 10*3/uL — ABNORMAL HIGH (ref 1.7–7.7)
Neutrophils Relative %: 86 %
Platelets: 253 10*3/uL (ref 150–400)
RBC: 2.78 MIL/uL — ABNORMAL LOW (ref 4.22–5.81)
RDW: 14.3 % (ref 11.5–15.5)
WBC: 20.2 10*3/uL — ABNORMAL HIGH (ref 4.0–10.5)
nRBC: 0 % (ref 0.0–0.2)

## 2020-06-02 LAB — SODIUM, URINE, RANDOM: Sodium, Ur: 14 mmol/L

## 2020-06-02 LAB — PROTIME-INR
INR: 1.3 — ABNORMAL HIGH (ref 0.8–1.2)
Prothrombin Time: 16.6 seconds — ABNORMAL HIGH (ref 11.4–15.2)

## 2020-06-02 LAB — BLOOD GAS, VENOUS
Acid-base deficit: 15.7 mmol/L — ABNORMAL HIGH (ref 0.0–2.0)
Bicarbonate: 11.3 mmol/L — ABNORMAL LOW (ref 20.0–28.0)
O2 Saturation: 50.8 %
Patient temperature: 37
pCO2, Ven: 33.4 mmHg — ABNORMAL LOW (ref 44.0–60.0)
pH, Ven: 7.157 — CL (ref 7.250–7.430)
pO2, Ven: <31 mmHg — CL (ref 32.0–45.0)

## 2020-06-02 LAB — PATHOLOGIST SMEAR REVIEW

## 2020-06-02 LAB — PHOSPHORUS: Phosphorus: 6.8 mg/dL — ABNORMAL HIGH (ref 2.5–4.6)

## 2020-06-02 LAB — CREATININE, URINE, RANDOM: Creatinine, Urine: 57.04 mg/dL

## 2020-06-02 LAB — MAGNESIUM: Magnesium: 2.8 mg/dL — ABNORMAL HIGH (ref 1.7–2.4)

## 2020-06-02 LAB — PROCALCITONIN: Procalcitonin: 0.77 ng/mL

## 2020-06-02 MED ORDER — CALCIUM ACETATE (PHOS BINDER) 667 MG PO CAPS
1334.0000 mg | ORAL_CAPSULE | Freq: Three times a day (TID) | ORAL | Status: DC
  Administered 2020-06-04 – 2020-06-05 (×3): 1334 mg via ORAL
  Filled 2020-06-02 (×4): qty 2

## 2020-06-02 MED ORDER — SODIUM CHLORIDE 0.9 % IV SOLN
500.0000 mg | INTRAVENOUS | Status: DC
  Administered 2020-06-02 – 2020-06-04 (×3): 500 mg via INTRAVENOUS
  Filled 2020-06-02 (×4): qty 500

## 2020-06-02 MED ORDER — SODIUM BICARBONATE 8.4 % IV SOLN
100.0000 meq | Freq: Once | INTRAVENOUS | Status: AC
  Administered 2020-06-02: 100 meq via INTRAVENOUS
  Filled 2020-06-02: qty 50

## 2020-06-02 MED ORDER — POTASSIUM CHLORIDE 20 MEQ PO PACK: 40.0000 meq | PACK | Freq: Once | ORAL | Status: DC

## 2020-06-02 MED ORDER — POTASSIUM CHLORIDE 10 MEQ/100ML IV SOLN
10.0000 meq | INTRAVENOUS | Status: AC
  Administered 2020-06-02 (×4): 10 meq via INTRAVENOUS
  Filled 2020-06-02 (×4): qty 100

## 2020-06-02 MED ORDER — POTASSIUM CHLORIDE 10 MEQ/100ML IV SOLN
10.0000 meq | INTRAVENOUS | Status: AC
Start: 2020-06-02 — End: 2020-06-03
  Administered 2020-06-02 (×6): 10 meq via INTRAVENOUS
  Filled 2020-06-02 (×6): qty 100

## 2020-06-02 MED ORDER — LORAZEPAM 2 MG/ML IJ SOLN
1.0000 mg | Freq: Once | INTRAMUSCULAR | Status: AC
  Administered 2020-06-02: 1 mg via INTRAVENOUS
  Filled 2020-06-02: qty 1

## 2020-06-02 MED ORDER — CHLORHEXIDINE GLUCONATE CLOTH 2 % EX PADS
6.0000 | MEDICATED_PAD | Freq: Every day | CUTANEOUS | Status: DC
  Administered 2020-06-02 – 2020-06-03 (×2): 6 via TOPICAL

## 2020-06-02 MED ORDER — SODIUM CHLORIDE 0.9 % IV SOLN
1.0000 g | INTRAVENOUS | Status: DC
  Administered 2020-06-02 – 2020-06-03 (×2): 1 g via INTRAVENOUS
  Filled 2020-06-02 (×2): qty 10

## 2020-06-02 MED ORDER — LABETALOL HCL 5 MG/ML IV SOLN: 20.0000 mg | INTRAVENOUS | Status: DC | PRN

## 2020-06-02 MED ORDER — SODIUM BICARBONATE 8.4 % IV SOLN
50.0000 meq | Freq: Once | INTRAVENOUS | Status: AC
  Administered 2020-06-02: 50 meq via INTRAVENOUS

## 2020-06-02 NOTE — Consult Note (Addendum)
Referring Provider:  [facility]         Primary Care Physician:  Cassandria Anger, MD Primary Gastroenterologist:  Previously Dr. Deatra Ina We were asked to see this patient for:   Anemia, heme + stool               ASSESSMENT / PLAN:   # 80 yo male with history of dementia admitted with altered mental status / leukocytosis. Found to be anemic with heme + stools.   # Macrocytic anemia, FOBT +. Hgb 9.8, down from 11.7 in March 2022. Unable to get history from patient ( cannot reach wife by phone) to obtain history about overt GI blood loss.  Folate normal. Methylmalonic acid level is pending. No evidence for iron deficiency by labs. Hgb stable overnight at 9.7 --No NSAIDS listed on home med list.  --Will try to reach wife at different time to see if she can provide additional  information.such as NSAID use, GI complaints at home, overt GI bleeding. Following that we will make a decision about endoscopic evaluation following evaluation / resolution of acute issues.   # Altered mental status.  Head CT and MRI without acute findings. Normal ammonia. Work up of encephalopathy is underway but possibly secondary to uremia? Infection ( PNA)? Decreased renal clearance of Gabapentin?   # Leukocytosis. Workup ongoing but possibly secondary to  CAP. DARS negative. Flu negative. Blood cultures pending. Respiratory panel pending.   # AKI on CKD.  Initially rhabdomyolysis was a consideration but CK is normal. Obstructive uropathy?   # Chronic appearing foot ulcers. Per PCP notes he has previously declined wound care clinic    HPI:                                                                                                                             Chief Complaint:  Patient has none.   Marvin Morrison is a 80 y.o. male with a past medical history significant for hypertension, hyperlipidemia, CKD IIIb,  Dementia, LS spinal stenosis  * Unable to get history from patient, mainly due to  inability to understand speech.  No family present.  I called wife's cell but got her voicemail.   Patient brought to the ED yesterday after a fall while transferring to wheelchair. He was noted at that time to have slurred speech and altered mental status.  In the ED a head CT and MRI were negative for acute findings. His WBC was 24K , hgb 9.8 (down from 11.7 in late March ). MCV 102,  ferritin 241, TIBC 224 with 25% iron saturation, INR 1.4.  Normal folate . FOBT was positive.  Creatinine 4.41. He has AKI superimposed on CKD 3b.  He was retaining urine, Foley placed.    Unable to reach wife, got her voicemail. Patient's hemoglobin has been stable overnight. No evidence for active bleeding. Diarrhea listed in [place] but not unable to locate any details.  PREVIOUS ENDOSCOPIC EVALUATIONS / PERTINENT STUDIES    Normal screening colonoscopy in 2007  Past Medical History:  Diagnosis Date  . Arthritis   . Chronic kidney disease   . Colitis   . Eczema   . Elevated glucose   . HTN (hypertension)   . Hyperlipidemia   . Pneumonia   . Shingles 2009    Past Surgical History:  Procedure Laterality Date  . LUMBAR LAMINECTOMY/DECOMPRESSION MICRODISCECTOMY N/A 01/11/2016   Procedure: Lumbar two- three, Lumbar three- four, Lumbar four- five, Lumbar five-Sacral one Laminectomy;  Surgeon: Kristeen Miss, MD;  Location: [facility];  Service: Neurosurgery;  Laterality: N/A;  L2-3 L3-4 L4-5 L5-S1 Laminectomy  . TONSILLECTOMY     Age 49    Prior to Admission medications   Medication Sig Start Date End Date Taking? Authorizing Provider  acyclovir (ZOVIRAX) 400 MG tablet Take 1 tablet (400 mg total) by mouth 2 (two) times daily. 04/01/20   Plotnikov, Evie Lacks, MD  doxycycline (VIBRA-TABS) 100 MG tablet TAKE 1 TABLET BY MOUTH TWICE A DAY 01/05/20   Plotnikov, Evie Lacks, MD  furosemide (LASIX) 40 MG tablet Take 1 tablet (40 mg total) by mouth daily as needed for edema. 04/01/20 04/01/21  Plotnikov, Evie Lacks, MD   gabapentin (NEURONTIN) 100 MG capsule TAKE 1 CAPSULE BY MOUTH THREE TIMES A DAY 04/01/20   Plotnikov, Evie Lacks, MD  metoprolol succinate (TOPROL-XL) 50 MG 24 hr tablet TAKE 1 TABLET EVERY DAY. TAKE IMMEDIATELY FOLLOWING A MEAL 04/01/20   Plotnikov, Evie Lacks, MD  Multiple Vitamin (MULTIVITAMIN WITH MINERALS) TABS tablet Take 1 tablet by mouth daily. 02/05/16   Angiulli, Lavon Paganini, PA-C  mupirocin ointment (BACTROBAN) 2 % Use bid 04/06/20   Plotnikov, Evie Lacks, MD  ondansetron (ZOFRAN) 4 MG tablet TAKE 1 TABLET BY MOUTH EVERY DAY AS NEEDED FOR NAUSEA OR VOMITING 05/05/20   Plotnikov, Evie Lacks, MD  pantoprazole (PROTONIX) 40 MG tablet Take 1 tablet (40 mg total) by mouth daily. 04/01/20   Plotnikov, Evie Lacks, MD  tamsulosin (FLOMAX) 0.4 MG CAPS capsule TAKE 1 CAPSULE BY MOUTH EVERY DAY 04/01/20   Plotnikov, Evie Lacks, MD  triamcinolone ointment (KENALOG) 0.1 % Use bid 04/01/20   Plotnikov, Evie Lacks, MD    Current Facility-Administered Medications  Medication Dose Route Frequency Provider Last Rate Last Admin  . acetaminophen (TYLENOL) tablet 650 mg  650 mg Oral Q6H PRN Howerter, Justin B, DO       Or  . acetaminophen (TYLENOL) suppository 650 mg  650 mg Rectal Q6H PRN Howerter, Justin B, DO      . azithromycin (ZITHROMAX) 500 mg in sodium chloride 0.9 % 250 mL IVPB  500 mg Intravenous Q24H Howerter, Justin B, DO 250 mL/hr at 06/02/20 1234 500 mg at 06/02/20 1234  . calcium acetate (PHOSLO) capsule 1,334 mg  1,334 mg Oral TID WC Mansy, Jan A, MD      . cefTRIAXone (ROCEPHIN) 1 g in sodium chloride 0.9 % 100 mL IVPB  1 g Intravenous Q24H Howerter, Justin B, DO      . Chlorhexidine Gluconate Cloth 2 % PADS 6 each  6 each Topical Daily Howerter, Justin B, DO   6 each at 06/02/20 0857  . labetalol (NORMODYNE) injection 20 mg  20 mg Intravenous Q3H PRN Mansy, Jan A, MD      . lactated ringers infusion   Intravenous Continuous Howerter, Justin B, DO 100 mL/hr at 06/02/20 1233 New Bag at 06/02/20 1233  .  potassium  chloride 10 mEq in 100 mL IVPB  10 mEq Intravenous Q1 Hr x 4 Little Ishikawa, MD        Allergies as of 06/01/2020  . (No Known Allergies)    Family History  Problem Relation Age of Onset  . Hypertension Mother   . Hyperlipidemia Father   . Hypertension Father     Social History   Socioeconomic History  . Marital status: Married    Spouse name: Not on file  . Number of children: Not on file  . Years of education: Not on file  . Highest education level: Not on file  Occupational History  . Occupation: Customer service manager  Tobacco Use  . Smoking status: Never Smoker  . Smokeless tobacco: Never Used  Substance and Sexual Activity  . Alcohol use: No  . Drug use: No  . Sexual activity: Yes  Other Topics Concern  . Not on file  Social History Narrative  . Not on file   Social Determinants of Health   Financial Resource Strain: Not on file  Food Insecurity: Not on file  Transportation Needs: Not on file  Physical Activity: Not on file  Stress: Not on file  Social Connections: Not on file  Intimate Partner Violence: Not on file    Review of Systems: Unable to obtain due to altered mental status.     OBJECTIVE:    Physical Exam: Vital signs in last 24 hours: Temp:  [97.6 F (36.4 C)-98.7 F (37.1 C)] 98.7 F (37.1 C) (05/31 1207) Pulse Rate:  [83-101] 101 (05/31 1207) Resp:  [12-19] 15 (05/31 1207) BP: (104-154)/(61-143) 140/61 (05/31 1207) SpO2:  [95 %-100 %] 96 % (05/31 1207) Weight:  [80.3 kg-81.6 kg] 80.3 kg (05/31 0456) Last BM Date: 06/01/20 General:   Alert, chronically ill appearing, thin male in NAD Psych:  Pleasant, cooperative. Normal mood and affect. Eyes:  Pupils equal, sclera clear, no icterus.   Conjunctiva pink. Ears:  Normal auditory acuity. Nose:  No deformity, discharge,  or lesions. Neck:  Supple; no masses Lungs:  Clear throughout to auscultation.   No wheezes, crackles, or rhonchi.  Heart:  Regular rate and rhythm;  1+ BLE edema Abdomen:  Soft, non-distended, nontender, BS active, no palp mass   Rectal:  Deferred  Msk:  Symmetrical without gross deformities. . Neurologic:  Alert , unintelligible speech. Tries to follow commands. . Skin:  Scaly face with patches or erythema.  Filed Weights   06/01/20 1446 06/02/20 0456  Weight: 81.6 kg 80.3 kg     Scheduled inpatient medications . calcium acetate  1,334 mg Oral TID WC  . Chlorhexidine Gluconate Cloth  6 each Topical Daily      Intake/Output from previous day: 05/30 0701 - 05/31 0700 In: 1421.1 [I.V.:621.1; IV Piggyback:800] Out: 1350 [Urine:1350] Intake/Output this shift: Total I/O In: -  Out: 550 [Urine:550]   Lab Results: Recent Labs    06/01/20 1455 06/02/20 0051  WBC 23.9* 20.2*  HGB 9.8* 9.7*  HCT 28.7* 27.7*  PLT 291 253   BMET Recent Labs    06/01/20 1455 06/01/20 2049 06/02/20 0051  NA 135 136 135  K 3.2* 3.0* 2.8*  CL 105 107 105  CO2 11* 12* 12*  GLUCOSE 146* 156* 147*  BUN 123* 114* 111*  CREATININE 4.77* 4.43* 4.41*  CALCIUM 9.1 8.6* 8.3*   LFT Recent Labs    06/02/20 0051  PROT 5.7*  ALBUMIN 2.8*  AST 15  ALT 16  ALKPHOS  64  BILITOT 0.5   PT/INR Recent Labs    06/01/20 1509 06/02/20 0051  LABPROT 16.8* 16.6*  INR 1.4* 1.3*   Hepatitis Panel No results for input(s): HEPBSAG, HCVAB, HEPAIGM, HEPBIGM in the last 72 hours.   . CBC Latest Ref Rng & Units 06/02/2020 06/01/2020 04/01/2020  WBC 4.0 - 10.5 K/uL 20.2(H) 23.9(H) 9.0  Hemoglobin 13.0 - 17.0 g/dL 9.7(L) 9.8(L) 11.7(L)  Hematocrit 39.0 - 52.0 % 27.7(L) 28.7(L) 34.4(L)  Platelets 150 - 400 K/uL 253 291 202.0    . CMP Latest Ref Rng & Units 06/02/2020 06/01/2020 06/01/2020  Glucose 70 - 99 mg/dL 147(H) 156(H) 146(H)  BUN 8 - 23 mg/dL 111(H) 114(H) 123(H)  Creatinine 0.61 - 1.24 mg/dL 4.41(H) 4.43(H) 4.77(H)  Sodium 135 - 145 mmol/L 135 136 135  Potassium 3.5 - 5.1 mmol/L 2.8(L) 3.0(L) 3.2(L)  Chloride 98 - 111 mmol/L 105 107 105   CO2 22 - 32 mmol/L 12(L) 12(L) 11(L)  Calcium 8.9 - 10.3 mg/dL 8.3(L) 8.6(L) 9.1  Total Protein 6.5 - 8.1 g/dL 5.7(L) - 6.3(L)  Total Bilirubin 0.3 - 1.2 mg/dL 0.5 - 0.7  Alkaline Phos 38 - 126 U/L 48 - 51  AST 15 - 41 U/L 15 - 14(L)  ALT 0 - 44 U/L 16 - 15   Studies/Results: CT Head Wo Contrast  Result Date: 06/01/2020 CLINICAL DATA:  Fall yesterday EXAM: CT HEAD WITHOUT CONTRAST CT CERVICAL SPINE WITHOUT CONTRAST TECHNIQUE: Multidetector CT imaging of the head and cervical spine was performed following the standard protocol without intravenous contrast. Multiplanar CT image reconstructions of the cervical spine were also generated. COMPARISON:  None. FINDINGS: CT HEAD FINDINGS Brain: No evidence of acute infarction, hemorrhage, hydrocephalus, extra-axial collection or mass lesion/mass effect. Periventricular white matter hypodensity. Vascular: No hyperdense vessel or unexpected calcification. Skull: Normal. Negative for fracture or focal lesion. Sinuses/Orbits: No acute finding. Other: None. CT CERVICAL SPINE FINDINGS Alignment: Normal. Skull base and vertebrae: No acute fracture. No primary bone lesion or focal pathologic process. Soft tissues and spinal canal: No prevertebral fluid or swelling. No visible canal hematoma. Disc levels: Severe disc space height and osteophytosis of C4 through C6. Upper chest: Negative. Other: None. IMPRESSION: 1. No acute intracranial pathology. Small-vessel white matter disease. 2. No fracture or static subluxation of the cervical spine. 3. Severe disc space height and osteophytosis of C4 through C6. Electronically Signed   By: Eddie Candle M.D.   On: 06/01/2020 15:32   CT Cervical Spine Wo Contrast  Result Date: 06/01/2020 CLINICAL DATA:  Fall yesterday EXAM: CT HEAD WITHOUT CONTRAST CT CERVICAL SPINE WITHOUT CONTRAST TECHNIQUE: Multidetector CT imaging of the head and cervical spine was performed following the standard protocol without intravenous contrast.  Multiplanar CT image reconstructions of the cervical spine were also generated. COMPARISON:  None. FINDINGS: CT HEAD FINDINGS Brain: No evidence of acute infarction, hemorrhage, hydrocephalus, extra-axial collection or mass lesion/mass effect. Periventricular white matter hypodensity. Vascular: No hyperdense vessel or unexpected calcification. Skull: Normal. Negative for fracture or focal lesion. Sinuses/Orbits: No acute finding. Other: None. CT CERVICAL SPINE FINDINGS Alignment: Normal. Skull base and vertebrae: No acute fracture. No primary bone lesion or focal pathologic process. Soft tissues and spinal canal: No prevertebral fluid or swelling. No visible canal hematoma. Disc levels: Severe disc space height and osteophytosis of C4 through C6. Upper chest: Negative. Other: None. IMPRESSION: 1. No acute intracranial pathology. Small-vessel white matter disease. 2. No fracture or static subluxation of the cervical spine. 3. Severe disc  space height and osteophytosis of C4 through C6. Electronically Signed   By: Eddie Candle M.D.   On: 06/01/2020 15:32   MR BRAIN WO CONTRAST  Result Date: 06/02/2020 CLINICAL DATA:  Fall.  Slurred speech.  Confusion. EXAM: MRI HEAD WITHOUT CONTRAST TECHNIQUE: Multiplanar, multiecho pulse sequences of the brain and surrounding structures were obtained without intravenous contrast. COMPARISON:  Head CT 06/01/2020 FINDINGS: The study is mildly motion degraded. Brain: There is no evidence of an acute infarct, intracranial hemorrhage, mass, midline shift, or extra-axial fluid collection. There is mild generalized cerebral atrophy. T2 hyperintensities in the cerebral white matter are nonspecific but compatible with minimal chronic small vessel ischemic disease, within normal limits for age. Vascular: Major intracranial vascular flow voids are preserved. Skull and upper cervical spine: Unremarkable bone marrow signal. Sinuses/Orbits: Unremarkable orbits. Paranasal sinuses and mastoid  air cells are clear. Other: None. IMPRESSION: No acute intracranial abnormality. Electronically Signed   By: Logan Bores M.D.   On: 06/02/2020 11:32   DG Chest Portable 1 View  Result Date: 06/01/2020 CLINICAL DATA:  80 year old male with weakness. EXAM: PORTABLE CHEST 1 VIEW COMPARISON:  Chest radiograph dated 04/24/2017. FINDINGS: Shallow inspiration with left lung base atelectasis or infiltrate. There is no pleural effusion pneumothorax. The cardiac silhouette is within limits. Atherosclerotic calcification of the aorta. Degenerative changes of spine. No acute osseous pathology. IMPRESSION: Shallow inspiration with left lung base atelectasis or infiltrate. Electronically Signed   By: Anner Crete M.D.   On: 06/01/2020 18:09    Principal Problem:   AKI (acute kidney injury) ([place]) Active Problems:   Hyperlipidemia   Essential hypertension   Urinary retention   Acute encephalopathy   High anion gap metabolic acidosis   Leukocytosis   Acute on chronic anemia   GERD (gastroesophageal reflux disease)    Tye Savoy, NP-C @  06/02/2020, 2:15 PM  GI ATTENDING  History, laboratories, x-rays reviewed.  Patient seen and examined.  Agree with comprehensive consultation note as outlined above.  We are asked to see the patient for interval anemia and Hemoccult-positive stool.  He has multiple significant medical problems.  No obvious overt GI bleeding.  Admitted with altered mental status and significant leukocytosis.  Anemia does not seem to be the cause for his acute presentation.  He has multiple reasons for anemia.  At this point I agree with thorough evaluation of his acute problems.  Continue PPI.  Monitor blood counts.  Possible upper endoscopy to be considered in this high risk patient.  We will follow.  Docia Chuck. Geri Seminole., M.D. [facility] Division of Gastroenterology

## 2020-06-02 NOTE — Plan of Care (Signed)

## 2020-06-02 NOTE — Progress Notes (Signed)
PROGRESS NOTE    Marvin Morrison  XBM:[id number] DOB: [birth year] DOA: 06/01/2020 PCP: Cassandria Anger, MD   Brief Narrative:  Marvin Morrison is a 80 y.o. male with medical history significant for hypertension, hyperlipidemia, stage IIIb chronic kidney disease with baseline creatinine 1.5-1.8, urinary retention, chronic anemia with baseline hemoglobin of 12, who is admitted to [facility] on 06/01/2020 with acute kidney injury superimposed on stage IIIb chronic kidney disease after presenting from home to [facility] ED complaining of slurred speech. Patient's wife reports that at 1400 on 05/31/2020 that the patient fell while ambulating at their home without loss of consciousness but exhibiting evidence of new onset slurred speech, without associated evidence of facial droop.  She reports that he also appeared confused relative to his baseline mental status, and she notes that patient was at his baseline level of health leading up to this.    Assessment & Plan:   Principal Problem:   AKI (acute kidney injury) ([place]) Active Problems:   Hyperlipidemia   Essential hypertension   Urinary retention   Acute encephalopathy   High anion gap metabolic acidosis   Leukocytosis   Acute on chronic anemia   GERD (gastroesophageal reflux disease)   Acute metabolic encephalopathy likely in the setting of uremia of unclear source.  POA. Rule out possible infection as below - Certainly could be due to worsening CKD 3B as below however patient also has FOBT positive, unclear if he has concurrent upper GI bleed given uremia appears to be more pronounced than would be expected with simple AKI - GI has been consulted for further evaluation and testing, he may not be appropriate for endoscopy given his current status but appreciate GI insight and recommendations -Patient received Ativan today for MRI which was unremarkable for any acute findings stroke bleed or tumor.  He does not have a high risk of  polypharmacy or withdrawal per discussion with daughter and wife.  AKI on CKD3B -Unclear etiology, wife is a somewhat poor historian but indicates poor p.o. intake and the patient - The patient is essentially nonresponsive after Ativan for MRI  - Daughter is somewhat limited in her contact with the family, understandably so, due to their recurrent noncompliance and nonadherence to medication, lifestyle recommendations and poor hygiene despite her best attempts to ensure appropriate medical care  Leukocytosis Rule out acute infection -Questionably reactive, procalcitonin negative -We will continue antibiotics for additional 24 hours and follow cultures, if still negative at that time we will likely discontinue antibiotics and follow clinically -Patient has multiple skin lesions, skin tears, notable right foot scab that will need addressing with wound care.  Will order plain x-ray of the right foot to ensure no underlying fractures or osteomyelitis given what appears to be extremely poor hygiene  Hypokalemia  2.8 today -scheduled to receive 100 mEq IV given his poor p.o. intake/inability to take p.o. safely Follow morning labs  AGMA -secondary to uremia -As above unclear if completely related to patient's renal decline or questionably upper GI bleed given elevated BUN out of proportion with creatinine  FOBT -GI following, appreciate insight and recommendations  Severe protein caloric malnutrition -Continue to encourage p.o. intake once able to tolerate p.o. safely  Profound medication and medical noncompliance Unsafe disposition -Lengthy discussion with daughter at bedside about general lack of hygiene and the patient, unsafe home situation and limited care by her mother, the patient's wife, due to what appears to be memory issues/early onset but undiagnosed dementia. -We will  ask case management and social work to discuss possible modalities for discharge and assistance with the daughter  given patient may require placement due to his mental status changes and daughter would like some insight into how to best care for her mother who would not be able to care for herself if the patient were unable to return home and his previous mental state  DVT prophylaxis: SCDs only given questionable GI bleed as above Code Status: DNR, confirmed with daughter and wife at bedside -this is a code change since admission.  Patient apparently has living will and would not want any aggressive medical care including CPR, intubation. Family Communication: Lengthy discussion with daughter at bedside  Status is: Inpatient  Dispo: The patient is from: Home              Anticipated d/c is to: To be determined              Anticipated d/c date is: >72h              Patient currently not medically stable for discharge  Consultants:   GI  Procedures:   None  Antimicrobials:  Azithromycin, ceftriaxone  Subjective: No acute issues or events overnight, patient is somnolent this morning after Ativan for MRI.  Review of systems markedly limited -discussed case at length with daughter at bedside who is visibly concerned about her father's inability to care for himself as visible by multiple skin lesions, bedsore and cachexia  Objective: Vitals:   06/01/20 2252 06/02/20 0051 06/02/20 0456 06/02/20 0501  BP: 131/68 104/80  (!) 120/91  Pulse: 100 100  92  Resp: 19 16  15   Temp: 98.3 F (36.8 C) 98 F (36.7 C)  97.6 F (36.4 C)  TempSrc: Axillary Axillary  Axillary  SpO2: 99% 98%  95%  Weight:   80.3 kg   Height:        Intake/Output Summary (Last 24 hours) at 06/02/2020 0804 Last data filed at 06/02/2020 1194 Gross per 24 hour  Intake 1421.07 ml  Output 1350 ml  Net 71.07 ml   Filed Weights   06/01/20 1446 06/02/20 0456  Weight: 81.6 kg 80.3 kg    Examination:  General:  Pleasantly resting in bed, No acute distress.  Somnolent but easily arousable.  Cachectic appearing in extremely  poor state of health HEENT: Multiple excoriations over his face forehead. Neck:  Without mass or deformity.  Trachea is midline. Lungs:  Clear to auscultate bilaterally without rhonchi, wheeze, or rales. Heart:  Regular rate and rhythm.  Without murmurs, rubs, or gallops. Abdomen:  Soft, nontender, nondistended.  Without guarding or rebound. Extremities: Without cyanosis, clubbing, edema, diffuse areas of hyperkeratosis over the lower extremities most notably the toes bilaterally, right toe has old dried blood over the right lateral 3 toes with profound deformity and angulation of the second toe Vascular:  Dorsalis pedis and posterior tibial pulses weakly palpable bilaterally Skin:  Warm and dry, no erythema, no ulcerations.  Data Reviewed: I have personally reviewed following labs and imaging studies  CBC: Recent Labs  Lab 06/01/20 1455 06/02/20 0051  WBC 23.9* 20.2*  NEUTROABS 20.4* 17.3*  HGB 9.8* 9.7*  HCT 28.7* 27.7*  MCV 102.5* 99.6  PLT 291 174   Basic Metabolic Panel: Recent Labs  Lab 06/01/20 1455 06/01/20 2049 06/02/20 0051  NA 135 136 135  K 3.2* 3.0* 2.8*  CL 105 107 105  CO2 11* 12* 12*  GLUCOSE 146* 156* 147*  BUN 123* 114* 111*  CREATININE 4.77* 4.43* 4.41*  CALCIUM 9.1 8.6* 8.3*  MG  --  2.8* 2.8*  PHOS  --   --  6.8*   GFR: Estimated Creatinine Clearance: 14.4 mL/min (A) (by C-G formula based on SCr of 4.41 mg/dL (H)). Liver Function Tests: Recent Labs  Lab 06/01/20 1455 06/02/20 0051  AST 14* 15  ALT 15 16  ALKPHOS 51 48  BILITOT 0.7 0.5  PROT 6.3* 5.7*  ALBUMIN 3.0* 2.8*   No results for input(s): LIPASE, AMYLASE in the last 168 hours. Recent Labs  Lab 06/01/20 2049  AMMONIA 26   Coagulation Profile: Recent Labs  Lab 06/01/20 1509 06/02/20 0051  INR 1.4* 1.3*   Cardiac Enzymes: Recent Labs  Lab 06/01/20 2049  CKTOTAL 61   BNP (last 3 results) No results for input(s): PROBNP in the last 8760 hours. HbA1C: No results for  input(s): HGBA1C in the last 72 hours. CBG: No results for input(s): GLUCAP in the last 168 hours. Lipid Profile: No results for input(s): CHOL, HDL, LDLCALC, TRIG, CHOLHDL, LDLDIRECT in the last 72 hours. Thyroid Function Tests: Recent Labs    06/01/20 2049  TSH 1.083   Anemia Panel: Recent Labs    06/01/20 2049  FOLATE 33.4  FERRITIN 241  TIBC 224*  IRON 56  RETICCTPCT 2.9   Sepsis Labs: Recent Labs  Lab 06/01/20 2049  PROCALCITON 0.77    Recent Results (from the past 240 hour(s))  Resp Panel by RT-PCR (Flu A&B, Covid) Nasopharyngeal Swab     Status: None   Collection Time: 06/01/20  3:54 PM   Specimen: Nasopharyngeal Swab; Nasopharyngeal(NP) swabs in vial transport medium  Result Value Ref Range Status   SARS Coronavirus 2 by RT PCR NEGATIVE NEGATIVE Final    Comment: (NOTE) SARS-CoV-2 target nucleic acids are NOT DETECTED.  The SARS-CoV-2 RNA is generally detectable in upper respiratory specimens during the acute phase of infection. The lowest concentration of SARS-CoV-2 viral copies this assay can detect is 138 copies/mL. A negative result does not preclude SARS-Cov-2 infection and should not be used as the sole basis for treatment or other patient management decisions. A negative result may occur with  improper specimen collection/handling, submission of specimen other than nasopharyngeal swab, presence of viral mutation(s) within the areas targeted by this assay, and inadequate number of viral copies(<138 copies/mL). A negative result must be combined with clinical observations, patient history, and epidemiological information. The expected result is Negative.  Fact Sheet for Patients:  EntrepreneurPulse.com.au  Fact Sheet for Healthcare Providers:  IncredibleEmployment.be  This test is no t yet approved or cleared by the [place] FDA and  has been authorized for detection and/or diagnosis of SARS-CoV-2 by FDA  under an Emergency Use Authorization (EUA). This EUA will remain  in effect (meaning this test can be used) for the duration of the COVID-19 declaration under Section 564(b)(1) of the Act, 21 U.S.C.section 360bbb-3(b)(1), unless the authorization is terminated  or revoked sooner.       Influenza A by PCR NEGATIVE NEGATIVE Final   Influenza B by PCR NEGATIVE NEGATIVE Final    Comment: (NOTE) The Xpert Xpress SARS-CoV-2/FLU/RSV plus assay is intended as an aid in the diagnosis of influenza from Nasopharyngeal swab specimens and should not be used as a sole basis for treatment. Nasal washings and aspirates are unacceptable for Xpert Xpress SARS-CoV-2/FLU/RSV testing.  Fact Sheet for Patients: EntrepreneurPulse.com.au  Fact Sheet for Healthcare Providers: IncredibleEmployment.be  This test is  not yet approved or cleared by the [place] and has been authorized for detection and/or diagnosis of SARS-CoV-2 by FDA under an Emergency Use Authorization (EUA). This EUA will remain in effect (meaning this test can be used) for the duration of the COVID-19 declaration under Section 564(b)(1) of the Act, 21 U.S.C. section 360bbb-3(b)(1), unless the authorization is terminated or revoked.  Performed at [facility], [place] [address] [place], [place] [address]          Radiology Studies: CT Head Wo Contrast  Result Date: 06/01/2020 CLINICAL DATA:  Fall yesterday EXAM: CT HEAD WITHOUT CONTRAST CT CERVICAL SPINE WITHOUT CONTRAST TECHNIQUE: Multidetector CT imaging of the head and cervical spine was performed following the standard protocol without intravenous contrast. Multiplanar CT image reconstructions of the cervical spine were also generated. COMPARISON:  None. FINDINGS: CT HEAD FINDINGS Brain: No evidence of acute infarction, hemorrhage, hydrocephalus, extra-axial collection or mass lesion/mass effect. Periventricular white matter  hypodensity. Vascular: No hyperdense vessel or unexpected calcification. Skull: Normal. Negative for fracture or focal lesion. Sinuses/Orbits: No acute finding. Other: None. CT CERVICAL SPINE FINDINGS Alignment: Normal. Skull base and vertebrae: No acute fracture. No primary bone lesion or focal pathologic process. Soft tissues and spinal canal: No prevertebral fluid or swelling. No visible canal hematoma. Disc levels: Severe disc space height and osteophytosis of C4 through C6. Upper chest: Negative. Other: None. IMPRESSION: 1. No acute intracranial pathology. Small-vessel white matter disease. 2. No fracture or static subluxation of the cervical spine. 3. Severe disc space height and osteophytosis of C4 through C6. Electronically Signed   By: Eddie Candle M.D.   On: 06/01/2020 15:32   CT Cervical Spine Wo Contrast  Result Date: 06/01/2020 CLINICAL DATA:  Fall yesterday EXAM: CT HEAD WITHOUT CONTRAST CT CERVICAL SPINE WITHOUT CONTRAST TECHNIQUE: Multidetector CT imaging of the head and cervical spine was performed following the standard protocol without intravenous contrast. Multiplanar CT image reconstructions of the cervical spine were also generated. COMPARISON:  None. FINDINGS: CT HEAD FINDINGS Brain: No evidence of acute infarction, hemorrhage, hydrocephalus, extra-axial collection or mass lesion/mass effect. Periventricular white matter hypodensity. Vascular: No hyperdense vessel or unexpected calcification. Skull: Normal. Negative for fracture or focal lesion. Sinuses/Orbits: No acute finding. Other: None. CT CERVICAL SPINE FINDINGS Alignment: Normal. Skull base and vertebrae: No acute fracture. No primary bone lesion or focal pathologic process. Soft tissues and spinal canal: No prevertebral fluid or swelling. No visible canal hematoma. Disc levels: Severe disc space height and osteophytosis of C4 through C6. Upper chest: Negative. Other: None. IMPRESSION: 1. No acute intracranial pathology.  Small-vessel white matter disease. 2. No fracture or static subluxation of the cervical spine. 3. Severe disc space height and osteophytosis of C4 through C6. Electronically Signed   By: Eddie Candle M.D.   On: 06/01/2020 15:32   DG Chest Portable 1 View  Result Date: 06/01/2020 CLINICAL DATA:  80 year old male with weakness. EXAM: PORTABLE CHEST 1 VIEW COMPARISON:  Chest radiograph dated 04/24/2017. FINDINGS: Shallow inspiration with left lung base atelectasis or infiltrate. There is no pleural effusion pneumothorax. The cardiac silhouette is within limits. Atherosclerotic calcification of the aorta. Degenerative changes of spine. No acute osseous pathology. IMPRESSION: Shallow inspiration with left lung base atelectasis or infiltrate. Electronically Signed   By: Anner Crete M.D.   On: 06/01/2020 18:09   Scheduled Meds: . calcium acetate  1,334 mg Oral TID WC  . Chlorhexidine Gluconate Cloth  6 each Topical Daily   Continuous  Infusions: . azithromycin    . cefTRIAXone (ROCEPHIN)  IV    . lactated ringers 100 mL/hr at 06/01/20 2059  . potassium chloride 10 mEq (06/02/20 0738)     LOS: 1 day   Time spent: 52min  Leeloo Silverthorne C Cherell Colvin, DO [facility]  If 7PM-7AM, please contact night-coverage [email or web address]  06/02/2020, 8:04 AM

## 2020-06-02 NOTE — Progress Notes (Signed)
CSW received request to speak with patient's daughter regarding patient's home situation. CSW spoke with daughter; she will call CSW back in a little while.   Gilmore Laroche, MSW, [facility]

## 2020-06-03 ENCOUNTER — Inpatient Hospital Stay (HOSPITAL_COMMUNITY): Payer: Medicare Other

## 2020-06-03 DIAGNOSIS — L97512 Non-pressure chronic ulcer of other part of right foot with fat layer exposed: Secondary | ICD-10-CM

## 2020-06-03 DIAGNOSIS — D649 Anemia, unspecified: Secondary | ICD-10-CM

## 2020-06-03 LAB — BASIC METABOLIC PANEL
Anion gap: 17 — ABNORMAL HIGH (ref 5–15)
Anion gap: 10 (ref 5–15)
BUN: 87 mg/dL — ABNORMAL HIGH (ref 8–23)
BUN: 66 mg/dL — ABNORMAL HIGH (ref 8–23)
CO2: 14 mmol/L — ABNORMAL LOW (ref 22–32)
CO2: 17 mmol/L — ABNORMAL LOW (ref 22–32)
Calcium: 7.7 mg/dL — ABNORMAL LOW (ref 8.9–10.3)
Calcium: 7.3 mg/dL — ABNORMAL LOW (ref 8.9–10.3)
Chloride: 110 mmol/L (ref 98–111)
Chloride: 114 mmol/L — ABNORMAL HIGH (ref 98–111)
Creatinine, Ser: 3.01 mg/dL — ABNORMAL HIGH (ref 0.61–1.24)
Creatinine, Ser: 2.31 mg/dL — ABNORMAL HIGH (ref 0.61–1.24)
GFR, Estimated: 20 mL/min — ABNORMAL LOW (ref >60)
GFR, Estimated: 28 mL/min — ABNORMAL LOW (ref >60)
Glucose, Bld: 144 mg/dL — ABNORMAL HIGH (ref 70–99)
Glucose, Bld: 116 mg/dL — ABNORMAL HIGH (ref 70–99)
Potassium: 2.7 mmol/L — CL (ref 3.5–5.1)
Potassium: 2.7 mmol/L — CL (ref 3.5–5.1)
Sodium: 141 mmol/L (ref 135–145)
Sodium: 141 mmol/L (ref 135–145)

## 2020-06-03 LAB — CBC
HCT: 24.4 % — ABNORMAL LOW (ref 39.0–52.0)
Hemoglobin: 8.8 g/dL — ABNORMAL LOW (ref 13.0–17.0)
MCH: 35.3 pg — ABNORMAL HIGH (ref 26.0–34.0)
MCHC: 36.1 g/dL — ABNORMAL HIGH (ref 30.0–36.0)
MCV: 98 fL (ref 80.0–100.0)
Platelets: 253 10*3/uL (ref 150–400)
RBC: 2.49 MIL/uL — ABNORMAL LOW (ref 4.22–5.81)
RDW: 14.7 % (ref 11.5–15.5)
WBC: 16.2 10*3/uL — ABNORMAL HIGH (ref 4.0–10.5)
nRBC: 0 % (ref 0.0–0.2)

## 2020-06-03 LAB — MAGNESIUM: Magnesium: 2.3 mg/dL (ref 1.7–2.4)

## 2020-06-03 MED ORDER — POTASSIUM CHLORIDE CRYS ER 20 MEQ PO TBCR
40.0000 meq | EXTENDED_RELEASE_TABLET | Freq: Once | ORAL | Status: AC
  Administered 2020-06-03: 40 meq via ORAL
  Filled 2020-06-03: qty 2

## 2020-06-03 MED ORDER — ZINC OXIDE 12.8 % EX OINT
TOPICAL_OINTMENT | CUTANEOUS | Status: DC | PRN
  Administered 2020-06-04: 1 via TOPICAL
  Filled 2020-06-03 (×2): qty 56.7

## 2020-06-03 MED ORDER — LORAZEPAM 2 MG/ML IJ SOLN
0.5000 mg | Freq: Four times a day (QID) | INTRAMUSCULAR | Status: DC | PRN
  Administered 2020-06-03 – 2020-06-05 (×6): 0.5 mg via INTRAVENOUS
  Filled 2020-06-03 (×6): qty 1

## 2020-06-03 MED ORDER — LORAZEPAM 2 MG/ML IJ SOLN: 0.5000 mg | INTRAMUSCULAR | Status: DC | PRN

## 2020-06-03 MED ORDER — POTASSIUM CHLORIDE 20 MEQ PO PACK: 40.0000 meq | PACK | Freq: Two times a day (BID) | ORAL | Status: DC

## 2020-06-03 MED ORDER — POTASSIUM CHLORIDE 2 MEQ/ML IV SOLN
INTRAVENOUS | Status: AC
  Filled 2020-06-03: qty 1000

## 2020-06-03 MED ORDER — POTASSIUM CHLORIDE 10 MEQ/100ML IV SOLN
10.0000 meq | INTRAVENOUS | Status: AC
  Administered 2020-06-03 (×3): 10 meq via INTRAVENOUS
  Filled 2020-06-03 (×3): qty 100

## 2020-06-03 MED ORDER — POTASSIUM CHLORIDE 20 MEQ PO PACK
40.0000 meq | PACK | ORAL | Status: AC
  Administered 2020-06-03 (×2): 40 meq via ORAL
  Filled 2020-06-03 (×2): qty 2

## 2020-06-03 NOTE — NC FL2 (Signed)
[place] LEVEL OF CARE SCREENING TOOL     IDENTIFICATION  Patient Name: Marvin Morrison Birthdate: 05-11-1940 Sex: male Admission Date (Current Location): 06/01/2020  [facility] [place] Number:  Herbalist and Address:  [facility], [place] [address] [place], [place] [address]      Provider Number: [id number]  Attending Physician Name and Address:  Elgergawy, Silver Huguenin, MD  Relative Name and Phone Number:       Current Level of Care: Hospital Recommended Level of Care: [place] Prior Approval Number:    Date Approved/Denied:   PASRR Number: [id number]  Discharge Plan: SNF    Current Diagnoses: Patient Active Problem List   Diagnosis Date Noted  . Acute encephalopathy 06/02/2020  . High anion gap metabolic acidosis 34/74/2595  . Leukocytosis 06/02/2020  . Acute on chronic anemia 06/02/2020  . GERD (gastroesophageal reflux disease) 06/02/2020  . Noncompliance by refusing service 04/05/2020  . Ingrowing toenail with infection 12/12/2019  . Foot ulcer with fat layer exposed, right ([place]) 09/18/2019  . No CPR or defibrillation, use all other resuscitative measures 09/18/2019  . Noncompliance 03/25/2017  . Neoplasm of uncertain behavior of skin 08/16/2016  . Loose stools   . Hyperkalemia   . Acute lower UTI   . AKI (acute kidney injury) ([place])   . Sleep disturbance   . Labile blood pressure   . Neuropathic pain   . Dysuria   . Hypoalbuminemia due to protein-calorie malnutrition ([place])   . Lymphocytosis   . Hyperglycemia   . Radiculopathy 01/13/2016  . Surgery, elective   . Post-operative pain   . Acute blood loss anemia   . Stage 3 chronic kidney disease ([place])   . Urinary retention   . Hypokalemia   . Constipation due to pain medication   . Pressure injury of skin 01/12/2016  . Malnutrition of moderate degree 01/12/2016  . Lumbar stenosis 01/11/2016  . Gait disorder 11/18/2015  . Spinal stenosis of  lumbar region 10/28/2015  . Nausea with vomiting 04/19/2014  . Loss of weight 05/18/2012  . Well adult exam 05/13/2011  . 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 Anemia, iron deficiency 04/06/2010  . DIARRHEA, CHRONIC 03/27/2009  . 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 Hyperlipidemia 02/01/2008  . ABNORMAL GLUCOSE NEC 02/01/2008  . Weakness 11/19/2007  . SHINGLES 11/17/2007  . Essential hypertension 11/09/2006    Orientation RESPIRATION BLADDER Height & Weight     Self  Normal Incontinent,Indwelling catheter Weight: 177 lb 0.5 oz (80.3 kg) Height:  6' (182.9 cm)  BEHAVIORAL SYMPTOMS/MOOD NEUROLOGICAL BOWEL NUTRITION STATUS      Incontinent Diet (Please see DC Summary)  AMBULATORY STATUS COMMUNICATION OF NEEDS Skin   Extensive Assist Verbally Other (Comment),PU Stage and Appropriate Care (Stage II on buttocks with foam dressing; deep tissue injury on buttocks;)                       Personal Care Assistance Level of Assistance  Bathing,Feeding,Dressing Bathing Assistance: Maximum assistance Feeding assistance: Limited assistance Dressing Assistance: Limited assistance     Functional Limitations Info  Sight,Hearing Sight Info: Impaired Hearing Info: Impaired      SPECIAL CARE FACTORS FREQUENCY  PT (By licensed PT),OT (By licensed OT)     PT Frequency: 5x/week OT Frequency: 5x/week            Contractures Contractures Info: Not present    Additional Factors Info  Code Status,Allergies Code Status Info: Full Allergies Info: NKA           Current Medications (06/03/2020):  This is the current hospital active medication list Current Facility-Administered Medications  Medication Dose Route Frequency Provider Last Rate Last Admin  . acetaminophen (TYLENOL) tablet 650 mg  650 mg Oral Q6H PRN Howerter, Justin B, DO       Or  . acetaminophen (TYLENOL) suppository 650 mg  650 mg Rectal Q6H PRN Howerter, Justin B, DO      . azithromycin (ZITHROMAX) 500 mg in  sodium chloride 0.9 % 250 mL IVPB  500 mg Intravenous Q24H Howerter, Justin B, DO   Stopped at 06/03/20 1248  . calcium acetate (PHOSLO) capsule 1,334 mg  1,334 mg Oral TID WC Mansy, Jan A, MD      . cefTRIAXone (ROCEPHIN) 1 g in sodium chloride 0.9 % 100 mL IVPB  1 g Intravenous Q24H Howerter, Justin B, DO 200 mL/hr at 06/02/20 1925 1 g at 06/02/20 1925  . Chlorhexidine Gluconate Cloth 2 % PADS 6 each  6 each Topical Daily Howerter, Justin B, DO   6 each at 06/03/20 0935  . labetalol (NORMODYNE) injection 20 mg  20 mg Intravenous Q3H PRN Mansy, Jan A, MD      . lactated ringers infusion   Intravenous Continuous Howerter, Justin B, DO 100 mL/hr at 06/02/20 1233 New Bag at 06/02/20 1233  . LORazepam (ATIVAN) injection 0.5 mg  0.5 mg Intravenous QID PRN Mansy, Jan A, MD   0.5 mg at 06/03/20 1400  . Zinc Oxide (TRIPLE PASTE) 12.8 % ointment   Topical PRN Elgergawy, Silver Huguenin, MD         Discharge Medications: Please see discharge summary for a list of discharge medications.  Relevant Imaging Results:  Relevant Lab Results:   Additional Information SSN: [id number]. Unvaccinated for [place].  Benard Halsted, LCSW

## 2020-06-03 NOTE — Consult Note (Signed)
Reason for Consult: right great toe ulcer Referring Physician: Elgergawy  NIHAR KLUS is an 80 y.o. male.  HPI: This is an 80 year old male. He is resistant to exam and minimally contributory to history.  Past Medical History:  Diagnosis Date   Arthritis    Chronic kidney disease    Colitis    Diarrhea    Eczema    Elevated glucose    HTN (hypertension)    Hyperlipidemia    Pneumonia    Shingles 2009    Past Surgical History:  Procedure Laterality Date   LUMBAR LAMINECTOMY/DECOMPRESSION MICRODISCECTOMY N/A 01/11/2016   Procedure: Lumbar two- three, Lumbar three- four, Lumbar four- five, Lumbar five-Sacral one Laminectomy;  Surgeon: Kristeen Miss, MD;  Location: [facility];  Service: Neurosurgery;  Laterality: N/A;  L2-3 L3-4 L4-5 L5-S1 Laminectomy   TONSILLECTOMY     Age 38    Family History  Problem Relation Age of Onset   Hypertension Mother    Hyperlipidemia Father    Hypertension Father     Social History:  reports that he has never smoked. He has never used smokeless tobacco. He reports that he does not drink alcohol and does not use drugs.  Allergies: No Known Allergies  Medications: I have reviewed the patient's current medications.  Results for orders placed or performed during the hospital encounter of 06/01/20 (from the past 48 hour(s))  Blood culture (routine x 2)     Status: None (Preliminary result)   Collection Time: 06/01/20  6:25 PM   Specimen: BLOOD RIGHT HAND  Result Value Ref Range   Specimen Description BLOOD RIGHT HAND    Special Requests      BOTTLES DRAWN AEROBIC AND ANAEROBIC Blood Culture results may not be optimal due to an inadequate volume of blood received in culture bottles   Culture      NO GROWTH 2 DAYS Performed at [facility], [place] [address], [place], [place] [address]    Report Status PENDING   Basic metabolic panel     Status: Abnormal   Collection Time: 06/01/20  8:49 PM  Result Value Ref Range   Sodium 136 135 - 145 mmol/L    Potassium 3.0 (L) 3.5 - 5.1 mmol/L   Chloride 107 98 - 111 mmol/L   CO2 12 (L) 22 - 32 mmol/L   Glucose, Bld 156 (H) 70 - 99 mg/dL    Comment: Glucose reference range applies only to samples taken after fasting for at least 8 hours.   BUN 114 (H) 8 - 23 mg/dL   Creatinine, Ser 4.43 (H) 0.61 - 1.24 mg/dL   Calcium 8.6 (L) 8.9 - 10.3 mg/dL   GFR, Estimated 13 (L) >60 mL/min    Comment: (NOTE) Calculated using the CKD-EPI Creatinine Equation (2021)    Anion gap 17 (H) 5 - 15    Comment: Performed at [facility] [address] [place], [place] [address]  Magnesium     Status: Abnormal   Collection Time: 06/01/20  8:49 PM  Result Value Ref Range   Magnesium 2.8 (H) 1.7 - 2.4 mg/dL    Comment: Performed at [facility] [address] [place],  [address]  CK     Status: None   Collection Time: 06/01/20  8:49 PM  Result Value Ref Range   Total CK 61 49 - 397 U/L    Comment: Performed at [facility], [place],  [place] [address]  Ammonia     Status: None   Collection Time: 06/01/20  8:49 PM  Result Value Ref Range   Ammonia 26 9 - 35 umol/L    Comment: Performed at [facility], [place] [address], [place], [place] [address]  TSH     Status: None   Collection Time: 06/01/20  8:49 PM  Result Value Ref Range   TSH 1.083 0.350 - 4.500 uIU/mL    Comment: Performed by a 3rd Generation assay with a functional sensitivity of <=0.01 uIU/mL. Performed at [facility], [place] [address] [place], [place] [address]   Procalcitonin - Baseline     Status: None   Collection Time: 06/01/20  8:49 PM  Result Value Ref Range   Procalcitonin 0.77 ng/mL    Comment:        Interpretation: PCT > 0.5 ng/mL and <= 2 ng/mL: Systemic infection (sepsis) is possible, but other conditions are known to elevate PCT as well. (NOTE)       Sepsis PCT Algorithm           Lower Respiratory Tract                                      Infection PCT Algorithm     ----------------------------     ----------------------------         PCT < 0.25 ng/mL                PCT < 0.10 ng/mL          Strongly encourage             Strongly discourage   discontinuation of antibiotics    initiation of antibiotics    ----------------------------     -----------------------------       PCT 0.25 - 0.50 ng/mL            PCT 0.10 - 0.25 ng/mL               OR       >80% decrease in PCT            Discourage initiation of                                            antibiotics      Encourage discontinuation           of antibiotics    ----------------------------     -----------------------------         PCT >= 0.50 ng/mL              PCT 0.26 - 0.50 ng/mL                AND       <80% decrease in PCT             Encourage initiation of                                             antibiotics       Encourage continuation           of antibiotics    ----------------------------     -----------------------------  PCT >= 0.50 ng/mL                  PCT > 0.50 ng/mL               AND         increase in PCT                  Strongly encourage                                      initiation of antibiotics    Strongly encourage escalation           of antibiotics                                     -----------------------------                                           PCT <= 0.25 ng/mL                                                 OR                                        > 80% decrease in PCT                                      Discontinue / Do not initiate                                             antibiotics  Performed at [facility], [address], [place], [place] [address]   Salicylate level     Status: Abnormal   Collection Time: 06/01/20  8:49 PM  Result Value Ref Range   Salicylate Lvl <4.2 (L) 7.0 - 30.0 mg/dL    Comment: Performed at [facility] [address], [place], [place] [address]  Iron and TIBC     Status: Abnormal    Collection Time: 06/01/20  8:49 PM  Result Value Ref Range   Iron 56 45 - 182 ug/dL   TIBC 224 (L) 250 - 450 ug/dL   Saturation Ratios 25 17.9 - 39.5 %   UIBC 168 ug/dL    Comment: Performed at [facility] [address] [place], [place] [address]  Reticulocytes     Status: Abnormal   Collection Time: 06/01/20  8:49 PM  Result Value Ref Range   Retic Ct Pct 2.9 0.4 - 3.1 %   RBC. 2.79 (L) 4.22 - 5.81 MIL/uL   Retic Count, Absolute 80.1 19.0 - 186.0 K/uL   Immature Retic Fract 13.9 2.3 - 15.9 %    Comment: Performed at [facility]  [address], [place], [place] [address]  Pathologist smear review     Status: None   Collection Time: 06/01/20  8:49 PM  Result Value Ref Range   Path Review      Neutrophilia with scattered bands and monocytosis.  Normocytic anemia with mild anisocytosis and polychromasia.    Comment: Reviewed by Jolene Provost, MD, PhD 06/02/2020 Performed at [facility], [place] [address], [place], [place] [address]   Folate     Status: None   Collection Time: 06/01/20  8:49 PM  Result Value Ref Range   Folate 33.4 >5.9 ng/mL    Comment: Performed at [facility], [place] [address] [place], [place] [address]  Ferritin     Status: None   Collection Time: 06/01/20  8:49 PM  Result Value Ref Range   Ferritin 241 24 - 336 ng/mL    Comment: Performed at [facility] [address] [place], [place] [address]  Phosphorus     Status: Abnormal   Collection Time: 06/02/20 12:51 AM  Result Value Ref Range   Phosphorus 6.8 (H) 2.5 - 4.6 mg/dL    Comment: Performed at [facility] [address] [place], [place] [address]  Magnesium     Status: Abnormal   Collection Time: 06/02/20 12:51 AM  Result Value Ref Range   Magnesium 2.8 (H) 1.7 - 2.4 mg/dL    Comment: Performed at [facility] [address], [place], [place] [address]  Comprehensive metabolic panel     Status: Abnormal   Collection Time: 06/02/20 12:51 AM   Result Value Ref Range   Sodium 135 135 - 145 mmol/L   Potassium 2.8 (L) 3.5 - 5.1 mmol/L   Chloride 105 98 - 111 mmol/L   CO2 12 (L) 22 - 32 mmol/L   Glucose, Bld 147 (H) 70 - 99 mg/dL    Comment: Glucose reference range applies only to samples taken after fasting for at least 8 hours.   BUN 111 (H) 8 - 23 mg/dL   Creatinine, Ser 4.41 (H) 0.61 - 1.24 mg/dL   Calcium 8.3 (L) 8.9 - 10.3 mg/dL   Total Protein 5.7 (L) 6.5 - 8.1 g/dL   Albumin 2.8 (L) 3.5 - 5.0 g/dL   AST 15 15 - 41 U/L   ALT 16 0 - 44 U/L   Alkaline Phosphatase 48 38 - 126 U/L   Total Bilirubin 0.5 0.3 - 1.2 mg/dL   GFR, Estimated 13 (L) >60 mL/min    Comment: (NOTE) Calculated using the CKD-EPI Creatinine Equation (2021)    Anion gap 18 (H) 5 - 15    Comment: Performed at [facility], [place] [address], [place], [place] [address]  CBC with Differential/Platelet     Status: Abnormal   Collection Time: 06/02/20 12:51 AM  Result Value Ref Range   WBC 20.2 (H) 4.0 - 10.5 K/uL   RBC 2.78 (L) 4.22 - 5.81 MIL/uL   Hemoglobin 9.7 (L) 13.0 - 17.0 g/dL   HCT 27.7 (L) 39.0 - 52.0 %   MCV 99.6 80.0 - 100.0 fL   MCH 34.9 (H) 26.0 - 34.0 pg   MCHC 35.0 30.0 - 36.0 g/dL   RDW 14.3 11.5 - 15.5 %   Platelets 253 150 - 400 K/uL   nRBC 0.0 0.0 - 0.2 %   Neutrophils Relative % 86 %   Neutro Abs 17.3 (H) 1.7 - 7.7 K/uL   Lymphocytes Relative 4 %  Lymphs Abs 0.8 0.7 - 4.0 K/uL   Monocytes Relative 8 %   Monocytes Absolute 1.7 (H) 0.1 - 1.0 K/uL   Eosinophils Relative 1 %   Eosinophils Absolute 0.2 0.0 - 0.5 K/uL   Basophils Relative 0 %   Basophils Absolute 0.1 0.0 - 0.1 K/uL   Immature Granulocytes 1 %   Abs Immature Granulocytes 0.21 (H) 0.00 - 0.07 K/uL    Comment: Performed at [facility] [address] [place], [place] [address]  Blood gas, venous     Status: Abnormal   Collection Time: 06/02/20 12:51 AM  Result Value Ref Range   pH, Ven 7.157 (LL) 7.250 - 7.430    Comment: CRITICAL RESULT CALLED TO,  READ BACK BY AND VERIFIED WITH: T.BULLOCK RN.@[id number]  ON 5.31.2022 BY TCALDWELL MT.    pCO2, Ven 33.4 (L) 44.0 - 60.0 mmHg   pO2, Ven <31.0 (LL) 32.0 - 45.0 mmHg    Comment: CRITICAL RESULT CALLED TO, READ BACK BY AND VERIFIED WITH: T.BULLOCK RN.@[email or web address]  ON 5.31.2022 BY TCALDWELL MT.    Bicarbonate 11.3 (L) 20.0 - 28.0 mmol/L   Acid-base deficit 15.7 (H) 0.0 - 2.0 mmol/L   O2 Saturation 50.8 %   Patient temperature 37.0     Comment: Performed at [facility], [place] [address] [place], [place] [address]  Protime-INR     Status: Abnormal   Collection Time: 06/02/20 12:51 AM  Result Value Ref Range   Prothrombin Time 16.6 (H) 11.4 - 15.2 seconds   INR 1.3 (H) 0.8 - 1.2    Comment: (NOTE) INR goal varies based on device and disease states. Performed at [facility], [place] [address] [place], [place] [address]   Sodium, urine, random     Status: None   Collection Time: 06/02/20  1:35 AM  Result Value Ref Range   Sodium, Ur 14 mmol/L    Comment: Performed at [facility] [address] [place], [place] [address]  Creatinine, urine, random     Status: None   Collection Time: 06/02/20  1:35 AM  Result Value Ref Range   Creatinine, Urine 57.04 mg/dL    Comment: Performed at [facility] [address] [place], [place] [address]  CBC     Status: Abnormal   Collection Time: 06/03/20 12:46 AM  Result Value Ref Range   WBC 16.2 (H) 4.0 - 10.5 K/uL   RBC 2.49 (L) 4.22 - 5.81 MIL/uL   Hemoglobin 8.8 (L) 13.0 - 17.0 g/dL   HCT 24.4 (L) 39.0 - 52.0 %   MCV 98.0 80.0 - 100.0 fL   MCH 35.3 (H) 26.0 - 34.0 pg   MCHC 36.1 (H) 30.0 - 36.0 g/dL   RDW 14.7 11.5 - 15.5 %   Platelets 253 150 - 400 K/uL   nRBC 0.0 0.0 - 0.2 %    Comment: Performed at [facility], [place] [address], [place],  [address]  Basic metabolic panel     Status: Abnormal   Collection Time: 06/03/20 12:46 AM  Result Value Ref Range   Sodium 141 135 - 145 mmol/L   Potassium 2.7 (LL) 3.5 -  5.1 mmol/L    Comment: CRITICAL RESULT CALLED TO, READ BACK BY AND VERIFIED WITH: Martha Clan, RN. [phone] 06/03/20 [facility]    Chloride 110 98 - 111 mmol/L   CO2 14 (L) 22 - 32 mmol/L   Glucose, Bld 144 (H) 70 - 99  mg/dL    Comment: Glucose reference range applies only to samples taken after fasting for at least 8 hours.   BUN 87 (H) 8 - 23 mg/dL   Creatinine, Ser 3.01 (H) 0.61 - 1.24 mg/dL   Calcium 7.7 (L) 8.9 - 10.3 mg/dL   GFR, Estimated 20 (L) >60 mL/min    Comment: (NOTE) Calculated using the CKD-EPI Creatinine Equation (2021)    Anion gap 17 (H) 5 - 15    Comment: Performed at [facility] [address] [place], [place] [address]  Magnesium     Status: None   Collection Time: 06/03/20 12:46 AM  Result Value Ref Range   Magnesium 2.3 1.7 - 2.4 mg/dL    Comment: Performed at [facility] [address], [place], [place] [address]    DG Knee 1-2 Views Left  Result Date: 06/03/2020 CLINICAL DATA:  80 year old male with fall and knee pain. EXAM: LEFT KNEE - 1-2 VIEW COMPARISON:  None. 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 Otherwise mild tricompartmental degenerative changes. Patella appears intact. On cross-table lateral views there is no significant joint effusion. No acute osseous abnormality identified. Mild calcified peripheral vascular disease. IMPRESSION: 1. No acute fracture or dislocation identified about the left knee. 2. 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 Electronically Signed   By: Genevie Ann M.D.   On: 06/03/2020 11:14   MR BRAIN WO CONTRAST  Result Date: 06/02/2020 CLINICAL DATA:  Fall.  Slurred speech.  Confusion. EXAM: MRI HEAD WITHOUT CONTRAST TECHNIQUE: Multiplanar, multiecho pulse sequences of the brain and surrounding structures were obtained without intravenous contrast. COMPARISON:  Head CT 06/01/2020 FINDINGS: The study is  mildly motion degraded. Brain: There is no evidence of an acute infarct, intracranial hemorrhage, mass, midline shift, or extra-axial fluid collection. There is mild generalized cerebral atrophy. T2 hyperintensities in the cerebral white matter are nonspecific but compatible with minimal chronic small vessel ischemic disease, within normal limits for age. Vascular: Major intracranial vascular flow voids are preserved. Skull and upper cervical spine: Unremarkable bone marrow signal. Sinuses/Orbits: Unremarkable orbits. Paranasal sinuses and mastoid air cells are clear. Other: None. IMPRESSION: No acute intracranial abnormality. Electronically Signed   By: Logan Bores M.D.   On: 06/02/2020 11:32   DG Ankle Left Port  Result Date: 06/03/2020 CLINICAL DATA:  Pain left leg and ankle. EXAM: PORTABLE LEFT ANKLE - 2 VIEW COMPARISON:  No prior. FINDINGS: Mild soft tissue swelling Corticated bony density noted adjacent to the medial malleolus. This is most likely an old fracture fragment. No acute bony or joint abnormality identified. Peripheral vascular calcification. IMPRESSION: 1. Mild soft tissue swelling. Corticated bony density noted adjacent to the medial malleolus. This is most likely an old fracture fragment. No acute abnormality identified. 2.  Peripheral vascular disease. Electronically Signed   By: Marcello Moores  Register   On: 06/03/2020 15:06   DG Foot 2 Views Right  Result Date: 06/02/2020 CLINICAL DATA:  Right foot pain and swelling, initial encounter EXAM: RIGHT FOOT - 2 VIEW COMPARISON:  09/18/2019 FINDINGS: Generalized osteopenia is noted. Mild widening at the base of the second proximal phalanx is noted when compared with the prior exam suspicious for prior fracture and healing. No acute fracture is noted. Small calcaneal spurs are seen. IMPRESSION: No acute fracture noted. Changes suspicious for prior fracture with healing at the base of the second proximal phalanx. Generalized osteopenia is  noted.  Electronically Signed   By: Inez Catalina M.D.   On: 06/02/2020 18:30    ROS Blood pressure 119/64, pulse 88, temperature (!) 97.2 F (36.2 C), temperature source Oral, resp. rate 16, height 6' (1.829 m), weight 80.3 kg, SpO2 97 %.  Vitals:   06/03/20 1200 06/03/20 1628  BP: (!) 127/56 119/64  Pulse: 95 88  Resp: 17 16  Temp:  (!) 97.2 F (36.2 C)  SpO2: 98% 97%    General AA&O x3. Normal mood and affect.  Vascular Dorsalis pedis and posterior tibial pulses  present 2+ bilaterally  Capillary refill normal to all digits. Pedal hair growth normal.  Neurologic Epicritic sensation grossly absent.  Dermatologic (Wound) Wound Location: right 2nd toe Wound Base: exposed bone Peri-wound: Reddened Exudate: None: wound tissue dry  Orthopedic: Motor intact BLE.    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 Patient stated "can we talk about this later". Will f/u and further discuss with patient.  Evelina Bucy 06/03/2020, 6:13 PM   Best available via secure chat for questions or concerns.

## 2020-06-03 NOTE — Consult Note (Addendum)
WOC Nurse Consult Note: Patient receiving care in [facility] [phone] Per Imaging results of the right foot, no evidence of osteomyelitis only osteopenia. Daughter present and states that patient sits in recliner all the time and is very immobile, often incontinent and falls a lot.  Reason for Consult: R toe and Sacrum Wound type: Abrasions on the right foot with dried up blood and cracking dry skin surrounding the foot. He would not allow me to touch the left foot.  Sacrum: Bruising and MASD/IAD bilateral buttocks. Needed to be cleaned up at the time of assessment Pressure Injury POA: No Dressing procedure/placement/frequency: Clean the right foot including in between the toes with soap and water, rinse and dry. Apply Xeroform gauze in between the toes by weaving in and out and then a small Kerlex. Wrap the foot with Xeroform gauze and secure with Kerlex. Change daily.  Apply Prevalon heel lift boots to both feet. Kellie Simmering # [phone]) Clean the sacral area with no rinse cleanser, dry and apply triple paste. Apply daily or PRN soiling.  Monitor the wound area(s) for worsening of condition such as: Signs/symptoms of infection, increase in size, development of or worsening of odor, development of pain, or increased pain at the affected locations.   Notify the medical team if any of these develop.  Thank you for the consult. [place] nurse will not follow at this time.   Please re-consult the [facility] team if needed.  Cathlean Marseilles Tamala Julian, MSN, RN, [place], Lysle Pearl, [facility] Wound Treatment Associate Pager [phone]

## 2020-06-03 NOTE — TOC Initial Note (Addendum)
Transition of Care [facility]) - Initial/Assessment Note    Patient Details  Name: Marvin Morrison MRN: [id number] Date of Birth: May 04, 1940  [facility] CM/SW Contact:    Benard Halsted, LCSW Phone Number: 06/03/2020, 4:11 PM  Clinical Narrative:                 CSW received return call from patient's daughter, Marvin Morrison. She stated that if patient survives hospitalization, he will need to go to a SNF as her mother has Dementia and is unable to care for patient and the home conditions are unsafe. CSW discussed costs of long term care after rehab and she is meeting with patient's financial planner tomorrow to line everything up. CSW will send out referral once PT evaluations are done.   Expected Discharge Plan: Skilled Nursing Facility Barriers to Discharge: Continued Medical Work up,SNF Pending bed offer   Patient Goals and CMS Choice   CMS Medicare.gov Compare Post Acute Care list provided to:: Patient Represenative (must comment) Choice offered to / list presented to : Adult Children  Expected Discharge Plan and Services Expected Discharge Plan: [place] In-house Referral: Clinical Social Work   Post Acute Care Choice: [place] Living arrangements for the past 2 months: [place]                                      Prior Living Arrangements/Services Living arrangements for the past 2 months: Single Family Home Lives with:: Spouse Patient language and need for interpreter reviewed:: Yes Do you feel safe going back to the place where you live?: Yes      Need for Family Participation in Patient Care: Yes (Comment) Care giver support system in place?: Yes (comment) Current home services: DME Criminal Activity/Legal Involvement Pertinent to Current Situation/Hospitalization: No - Comment as needed  Activities of Daily Living      Permission Sought/Granted Permission sought to share information with : Facility Contact  Representative,Family Supports Permission granted to share information with : No  Share Information with NAME: [place]  Permission granted to share info w AGENCY: SNFs  Permission granted to share info w Relationship: Daughter  Permission granted to share info w Contact Information: [phone]  Emotional Assessment Appearance:: Appears stated age Attitude/Demeanor/Rapport: Unable to Assess Affect (typically observed): Unable to Assess Orientation: : Oriented to Self Alcohol / Substance Use: Not Applicable Psych Involvement: No (comment)  Admission diagnosis:  Uremia [N19] Dysarthria [R47.1] AKI (acute kidney injury) ([place]) [N17.9] Gastrointestinal hemorrhage, unspecified gastrointestinal hemorrhage type [K92.2] Patient Active Problem List   Diagnosis Date Noted  . Acute encephalopathy 06/02/2020  . High anion gap metabolic acidosis 91/63/8466  . Leukocytosis 06/02/2020  . Acute on chronic anemia 06/02/2020  . GERD (gastroesophageal reflux disease) 06/02/2020  . Noncompliance by refusing service 04/05/2020  . Ingrowing toenail with infection 12/12/2019  . Foot ulcer with fat layer exposed, right ([place]) 09/18/2019  . No CPR or defibrillation, use all other resuscitative measures 09/18/2019  . Noncompliance 03/25/2017  . Neoplasm of uncertain behavior of skin 08/16/2016  . Loose stools   . Hyperkalemia   . Acute lower UTI   . AKI (acute kidney injury) ([place])   . Sleep disturbance   . Labile blood pressure   . Neuropathic pain   . Dysuria   . Hypoalbuminemia due to protein-calorie malnutrition ([place])   . Lymphocytosis   . Hyperglycemia   .  Radiculopathy 01/13/2016  . Surgery, elective   . Post-operative pain   . Acute blood loss anemia   . Stage 3 chronic kidney disease ([place])   . Urinary retention   . Hypokalemia   . Constipation due to pain medication   . Pressure injury of skin 01/12/2016  . Malnutrition of moderate degree 01/12/2016  . Lumbar stenosis 01/11/2016   . Gait disorder 11/18/2015  . Spinal stenosis of lumbar region 10/28/2015  . Nausea with vomiting 04/19/2014  . Loss of weight 05/18/2012  . Well adult exam 05/13/2011  . 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 Anemia, iron deficiency 04/06/2010  . DIARRHEA, CHRONIC 03/27/2009  . 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 Hyperlipidemia 02/01/2008  . ABNORMAL GLUCOSE NEC 02/01/2008  . Weakness 11/19/2007  . SHINGLES 11/17/2007  . Essential hypertension 11/09/2006   PCP:  Plotnikov, Evie Lacks, MD Pharmacy:   [id number] [place], [place]. [address] [place] [place] [address] Phone: [phone] Fax: [phone]     Social Determinants of Health (SDOH) Interventions    Readmission Risk Interventions No flowsheet data found.

## 2020-06-03 NOTE — Plan of Care (Signed)

## 2020-06-03 NOTE — Progress Notes (Addendum)
Progress Note  Chief Complaint: macrocytic anemia        ASSESSMENT / PLAN:    # 80 yo male with history of dementia admitted with altered mental status / leukocytosis. Found to be anemic with heme + stools.   # Macrocytic anemia, FOBT +. Hgb 8.8, down from 11.7 in March 2022.  Folate normal. Methylmalonic acid level is pending. No evidence for iron deficiency by labs.  Patient only answering part of my questions, asked me to come back tomorrow.   --Placed another call to patient's wife Olin Hauser today. Her Daughter " Cristie Hem"  answered phone ( they were together) .  Daughter nor wife aware of any overt GI bleeding at home, He has chronic nausea ( years) and takes Zofran as needed. He "occaionally" takes NSAIDs ( unable to quantify amount / frequency).  I spoke with daughter about endoscopic evaluation. During this part of the conversation patient's wife had stepped out of the car. Daughter DOES NOT want to pursue endoscopic evaluation as she feels her Dad has no quality of life and she does not want to prolong misery..Daughter tells me that her mother is POA but has severe memory deficits and is unable to make medical decisions. Daughter taking steps to become POA.  --GI will sign off. I called [facility] Dr. Waldron [facility] and he will talk further to daughter and wife when they visit tomorrow.  [facility] will let us know if workup is desired. Daughter is [place] Librarian, academic". Her number is [phone].    # Altered mental status.  Head CT and MRI without acute findings. Normal ammonia. Work up of encephalopathy is underway but possibly secondary to uremia? Infection ( PNA)? Decreased renal clearance of Gabapentin?   # Leukocytosis. Workup ongoing but possibly secondary to  CAP. DARS negative. Flu negative. Blood cultures pending. Respiratory panel pending.   # AKI on CKD.  Initially rhabdomyolysis was a consideration but CK is normal. Obstructive uropathy?   # Chronic appearing foot ulcers. Per PCP notes  he has previously declined wound care clinic        SUBJECTIVE:   No complaints. He asks that I return tomorrow to talk with him or call wife if preferred    OBJECTIVE:    Scheduled inpatient medications:  . calcium acetate  1,334 mg Oral TID WC  . Chlorhexidine Gluconate Cloth  6 each Topical Daily   Continuous inpatient infusions:  . azithromycin Stopped (06/03/20 1248)  . cefTRIAXone (ROCEPHIN)  IV 1 g (06/02/20 1925)  . lactated ringers 100 mL/hr at 06/02/20 1233   PRN inpatient medications: acetaminophen **OR** acetaminophen, labetalol, LORazepam, Zinc Oxide  Vital signs in last 24 hours: Temp:  [97.7 F (36.5 C)-98.9 F (37.2 C)] 97.7 F (36.5 C) (06/01 0747) Pulse Rate:  [95-104] 95 (06/01 1200) Resp:  [14-20] 17 (06/01 1200) BP: (124-151)/(56-72) 127/56 (06/01 1200) SpO2:  [95 %-100 %] 98 % (06/01 1200) Last BM Date: 06/02/20  Intake/Output Summary (Last 24 hours) at 06/03/2020 1523 Last data filed at 06/03/2020 7096 Gross per 24 hour  Intake 2000 ml  Output 1450 ml  Net 550 ml     Physical Exam:  . General: Alert male in NAD . Heart:  Regular rate . Pulmonary: Normal respiratory effort . Abdomen: Soft, nondistended, nontender. Normal bowel sounds.  . Neurologic: Alert. Answers most questions appropriately but not sure about accuracy . Psych: Cooperative.   Filed Weights   06/01/20 1446 06/02/20 0456  Weight: 81.6 kg  80.3 kg    Intake/Output from previous day: 05/31 0701 - 06/01 0700 In: 2000  Out: 1650 [Urine:1650] Intake/Output this shift: Total I/O In: -  Out: 350 [Urine:350]    Lab Results: Recent Labs    06/01/20 1455 06/02/20 0051 06/03/20 0046  WBC 23.9* 20.2* 16.2*  HGB 9.8* 9.7* 8.8*  HCT 28.7* 27.7* 24.4*  PLT 291 253 253   BMET Recent Labs    06/01/20 2049 06/02/20 0051 06/03/20 0046  NA 136 135 141  K 3.0* 2.8* 2.7*  CL 107 105 110  CO2 12* 12* 14*  GLUCOSE 156* 147* 144*  BUN 114* 111* 87*  CREATININE 4.43*  4.41* 3.01*  CALCIUM 8.6* 8.3* 7.7*   LFT Recent Labs    06/02/20 0051  PROT 5.7*  ALBUMIN 2.8*  AST 15  ALT 16  ALKPHOS 48  BILITOT 0.5   PT/INR Recent Labs    06/01/20 1509 06/02/20 0051  LABPROT 16.8* 16.6*  INR 1.4* 1.3*   Hepatitis Panel No results for input(s): HEPBSAG, HCVAB, HEPAIGM, HEPBIGM in the last 72 hours.  DG Knee 1-2 Views Left  Result Date: 06/03/2020 CLINICAL DATA:  80 year old male with fall and knee pain. EXAM: LEFT KNEE - 1-2 VIEW COMPARISON:  None. 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 Otherwise mild tricompartmental degenerative changes. Patella appears intact. On cross-table lateral views there is no significant joint effusion. No acute osseous abnormality identified. Mild calcified peripheral vascular disease. IMPRESSION: 1. No acute fracture or dislocation identified about the left knee. 2. 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 Electronically Signed   By: Genevie Ann M.D.   On: 06/03/2020 11:14   CT Cervical Spine Wo Contrast  Result Date: 06/01/2020 CLINICAL DATA:  Fall yesterday EXAM: CT HEAD WITHOUT CONTRAST CT CERVICAL SPINE WITHOUT CONTRAST TECHNIQUE: Multidetector CT imaging of the head and cervical spine was performed following the standard protocol without intravenous contrast. Multiplanar CT image reconstructions of the cervical spine were also generated. COMPARISON:  None. FINDINGS: CT HEAD FINDINGS Brain: No evidence of acute infarction, hemorrhage, hydrocephalus, extra-axial collection or mass lesion/mass effect. Periventricular white matter hypodensity. Vascular: No hyperdense vessel or unexpected calcification. Skull: Normal. Negative for fracture or focal lesion. Sinuses/Orbits: No acute finding. Other: None. CT CERVICAL SPINE FINDINGS Alignment: Normal. Skull base and vertebrae: No acute fracture. No primary bone lesion or  focal pathologic process. Soft tissues and spinal canal: No prevertebral fluid or swelling. No visible canal hematoma. Disc levels: Severe disc space height and osteophytosis of C4 through C6. Upper chest: Negative. Other: None. IMPRESSION: 1. No acute intracranial pathology. Small-vessel white matter disease. 2. No fracture or static subluxation of the cervical spine. 3. Severe disc space height and osteophytosis of C4 through C6. Electronically Signed   By: Eddie Candle M.D.   On: 06/01/2020 15:32   MR BRAIN WO CONTRAST  Result Date: 06/02/2020 CLINICAL DATA:  Fall.  Slurred speech.  Confusion. EXAM: MRI HEAD WITHOUT CONTRAST TECHNIQUE: Multiplanar, multiecho pulse sequences of the brain and surrounding structures were obtained without intravenous contrast. COMPARISON:  Head CT 06/01/2020 FINDINGS: The study is mildly motion degraded. Brain: There is no evidence of an acute infarct, intracranial hemorrhage, mass, midline shift, or extra-axial fluid collection. There is mild generalized cerebral atrophy. T2 hyperintensities in the cerebral white matter are nonspecific but compatible with minimal chronic small vessel ischemic disease, within normal limits for age. Vascular: Major intracranial  vascular flow voids are preserved. Skull and upper cervical spine: Unremarkable bone marrow signal. Sinuses/Orbits: Unremarkable orbits. Paranasal sinuses and mastoid air cells are clear. Other: None. IMPRESSION: No acute intracranial abnormality. Electronically Signed   By: Logan Bores M.D.   On: 06/02/2020 11:32   DG Chest Portable 1 View  Result Date: 06/01/2020 CLINICAL DATA:  80 year old male with weakness. EXAM: PORTABLE CHEST 1 VIEW COMPARISON:  Chest radiograph dated 04/24/2017. FINDINGS: Shallow inspiration with left lung base atelectasis or infiltrate. There is no pleural effusion pneumothorax. The cardiac silhouette is within limits. Atherosclerotic calcification of the aorta. Degenerative changes of spine.  No acute osseous pathology. IMPRESSION: Shallow inspiration with left lung base atelectasis or infiltrate. Electronically Signed   By: Anner Crete M.D.   On: 06/01/2020 18:09   DG Ankle Left Port  Result Date: 06/03/2020 CLINICAL DATA:  Pain left leg and ankle. EXAM: PORTABLE LEFT ANKLE - 2 VIEW COMPARISON:  No prior. FINDINGS: Mild soft tissue swelling Corticated bony density noted adjacent to the medial malleolus. This is most likely an old fracture fragment. No acute bony or joint abnormality identified. Peripheral vascular calcification. IMPRESSION: 1. Mild soft tissue swelling. Corticated bony density noted adjacent to the medial malleolus. This is most likely an old fracture fragment. No acute abnormality identified. 2.  Peripheral vascular disease. Electronically Signed   By: Marcello Moores  Register   On: 06/03/2020 15:06   DG Foot 2 Views Right  Result Date: 06/02/2020 CLINICAL DATA:  Right foot pain and swelling, initial encounter EXAM: RIGHT FOOT - 2 VIEW COMPARISON:  09/18/2019 FINDINGS: Generalized osteopenia is noted. Mild widening at the base of the second proximal phalanx is noted when compared with the prior exam suspicious for prior fracture and healing. No acute fracture is noted. Small calcaneal spurs are seen. IMPRESSION: No acute fracture noted. Changes suspicious for prior fracture with healing at the base of the second proximal phalanx. Generalized osteopenia is noted. Electronically Signed   By: Inez Catalina M.D.   On: 06/02/2020 18:30        Principal Problem:   AKI (acute kidney injury) ([place]) Active Problems:   Hyperlipidemia   Essential hypertension   Urinary retention   Acute encephalopathy   High anion gap metabolic acidosis   Leukocytosis   Acute on chronic anemia   GERD (gastroesophageal reflux disease)     LOS: 2 days   Tye Savoy ,NP 06/03/2020, 3:23 PM  GI ATTENDING  Interval history and data reviewed.  Agree with interval progress note.  Discussion  with family noted.  At this point there is no acute GI process.  His anemia is clearly multifactorial.  I would recommend transfusing 2 clinically desired hemoglobin when necessary.  Also, recommend daily PPI for upper GI mucosal protection.  No plans for endoscopic evaluations as noted.  Should the patient develop an acute GI problem that you need help with, feel free to contact us.  Otherwise, we will sign off.  Primary service aware.  Docia Chuck. Geri Seminole., M.D. [facility] Division of Gastroenterology

## 2020-06-03 NOTE — Progress Notes (Addendum)
PROGRESS NOTE    Marvin Morrison  XBJ:[id number] DOB: October 26, 1940 DOA: 06/01/2020 PCP: Cassandria Anger, MD   Brief Narrative:   Marvin Morrison is a 80 y.o. male with medical history significant for hypertension, hyperlipidemia, stage IIIb chronic kidney disease with baseline creatinine 1.5-1.8, urinary retention, chronic anemia with baseline hemoglobin of 12, who is admitted to [facility] on 06/01/2020 with acute kidney injury superimposed on stage IIIb chronic kidney disease after presenting from home to [facility] ED complaining of slurred speech. Patient's wife reports that at 1400 on 05/31/2020 that the patient fell while ambulating at their home without loss of consciousness but exhibiting evidence of new onset slurred speech, without associated evidence of facial droop.  She reports that he also appeared confused relative to his baseline mental status, and she notes that patient was at his baseline level of health leading up to this.   Subjective:  No acute issues or events overnight,he is complaining of left ankle pain, denies any fever, chills,  Assessment & Plan:   Principal Problem:   AKI (acute kidney injury) ([place]) Active Problems:   Hyperlipidemia   Essential hypertension   Urinary retention   Acute encephalopathy   High anion gap metabolic acidosis   Leukocytosis   Acute on chronic anemia   GERD (gastroesophageal reflux disease)   Acute metabolic encephalopathy likely in the setting of uremia of unclear source.  POA. Rule out possible infection as below - Certainly could be due to worsening CKD 3B as below however patient also has FOBT positive, unclear if he has concurrent upper GI bleed given uremia appears to be more pronounced than would be expected with simple AKI - GI has been consulted for further evaluation and testing, he may not be appropriate for endoscopy given his current status but appreciate GI insight and recommendations - MRI which was unremarkable for  any acute findings stroke bleed or tumor.   - mental status continue to improve  AKI on CKD3B -Unclear etiology, wife is a somewhat poor historian but indicates poor p.o. intake and the patient - The patient is essentially nonresponsive after Ativan for MRI  -Creatinine improved with IV fluid, continue to avoid nephrotoxic medications.  Leukocytosis Rule out acute infection -Questionably reactive, procalcitonin negative -We will continue antibiotics for additional 24 hours and follow cultures, if still negative at that time we will likely discontinue antibiotics and follow clinically -Patient has multiple skin lesions, skin tears, notable right foot scab that will need addressing with wound care.  Will order plain x-ray of the right foot to ensure no underlying fractures or osteomyelitis given what appears to be extremely poor hygiene -I did consult podiatry to evaluate right foot, and second toe wound, as well to look at left ankle pain.  Left ankle pain -we will obtain x-ray to rule out fracture  Hypokalemia  -Severe, repleted  Hypertension -Blood pressure started to increase, will resume on Toprol-XL, but will lower her home dose from 50 to 25 mg.  AGMA -secondary to uremia -As above unclear if completely related to patient's renal decline or questionably upper GI bleed given elevated BUN out of proportion with creatinine  FOBT -GI following, awaiting further recommendations  Severe protein caloric malnutrition -Continue to encourage p.o. intake once able to tolerate p.o. safely  Profound medication and medical noncompliance Unsafe disposition -Lengthy discussion with daughter at bedside about general lack of hygiene and the patient, unsafe home situation and limited care by her mother, the patient's wife,  due to what appears to be memory issues/early onset but undiagnosed dementia. -We will ask case management and social work to discuss possible modalities for discharge and  assistance with the daughter given patient may require placement due to his mental status changes and daughter would like some insight into how to best care for her mother who would not be able to care for herself if the patient were unable to return home and his previous mental state  DVT prophylaxis: SCDs only given questionable GI bleed as above Code Status: DNR, confirmed with daughter and wife at bedside -this is a code change since admission.  Patient apparently has living will and would not want any aggressive medical care including CPR, intubation. Family Communication: Discussed with daughter at bedside  Status is: Inpatient  Dispo: The patient is from: Home              Anticipated d/c is to: To be determined              Anticipated d/c date is: >72h              Patient currently not medically stable for discharge  Consultants:   GI  Procedures:   None  Antimicrobials:  Azithromycin, ceftriaxone    Objective: Vitals:   06/03/20 0024 06/03/20 0517 06/03/20 0747 06/03/20 1200  BP: (!) 145/67 129/68 124/65 (!) 127/56  Pulse: 100 (!) 101 98 95  Resp: 15 18 16 17   Temp: 98.9 F (37.2 C) 98.7 F (37.1 C) 97.7 F (36.5 C)   TempSrc: Axillary Axillary Oral   SpO2: 95% 99%  98%  Weight:      Height:        Intake/Output Summary (Last 24 hours) at 06/03/2020 1532 Last data filed at 06/03/2020 0947 Gross per 24 hour  Intake 2000 ml  Output 1450 ml  Net 550 ml   Filed Weights   06/01/20 1446 06/02/20 0456  Weight: 81.6 kg 80.3 kg    Examination:   Awake Alert, Oriented X 3, No new F.N deficits, Normal affect, easily distracted Symmetrical Chest wall movement, Good air movement bilaterally, CTAB RRR,No Gallops,Rubs or new Murmurs, No Parasternal Heave +ve B.Sounds, Abd Soft, No tenderness, No rebound - guarding or rigidity. No Cyanosis, patient with tenderness to palpation in the left ankle area, left foot wrapped, with second toe wound, but no bleeding or  discharge.     Data Reviewed: I have personally reviewed following labs and imaging studies  CBC: Recent Labs  Lab 06/01/20 1455 06/02/20 0051 06/03/20 0046  WBC 23.9* 20.2* 16.2*  NEUTROABS 20.4* 17.3*  --   HGB 9.8* 9.7* 8.8*  HCT 28.7* 27.7* 24.4*  MCV 102.5* 99.6 98.0  PLT 291 253 096   Basic Metabolic Panel: Recent Labs  Lab 06/01/20 1455 06/01/20 2049 06/02/20 0051 06/03/20 0046  NA 135 136 135 141  K 3.2* 3.0* 2.8* 2.7*  CL 105 107 105 110  CO2 11* 12* 12* 14*  GLUCOSE 146* 156* 147* 144*  BUN 123* 114* 111* 87*  CREATININE 4.77* 4.43* 4.41* 3.01*  CALCIUM 9.1 8.6* 8.3* 7.7*  MG  --  2.8* 2.8* 2.3  PHOS  --   --  6.8*  --    GFR: Estimated Creatinine Clearance: 21.1 mL/min (A) (by C-G formula based on SCr of 3.01 mg/dL (H)). Liver Function Tests: Recent Labs  Lab 06/01/20 1455 06/02/20 0051  AST 14* 15  ALT 15 16  ALKPHOS 51 48  BILITOT  0.7 0.5  PROT 6.3* 5.7*  ALBUMIN 3.0* 2.8*   No results for input(s): LIPASE, AMYLASE in the last 168 hours. Recent Labs  Lab 06/01/20 2049  AMMONIA 26   Coagulation Profile: Recent Labs  Lab 06/01/20 1509 06/02/20 0051  INR 1.4* 1.3*   Cardiac Enzymes: Recent Labs  Lab 06/01/20 2049  CKTOTAL 61   BNP (last 3 results) No results for input(s): PROBNP in the last 8760 hours. HbA1C: No results for input(s): HGBA1C in the last 72 hours. CBG: No results for input(s): GLUCAP in the last 168 hours. Lipid Profile: No results for input(s): CHOL, HDL, LDLCALC, TRIG, CHOLHDL, LDLDIRECT in the last 72 hours. Thyroid Function Tests: Recent Labs    06/01/20 2049  TSH 1.083   Anemia Panel: Recent Labs    06/01/20 2049  FOLATE 33.4  FERRITIN 241  TIBC 224*  IRON 56  RETICCTPCT 2.9   Sepsis Labs: Recent Labs  Lab 06/01/20 2049  PROCALCITON 0.77    Recent Results (from the past 240 hour(s))  Resp Panel by RT-PCR (Flu A&B, Covid) Nasopharyngeal Swab     Status: None   Collection Time: 06/01/20   3:54 PM   Specimen: Nasopharyngeal Swab; Nasopharyngeal(NP) swabs in vial transport medium  Result Value Ref Range Status   SARS Coronavirus 2 by RT PCR NEGATIVE NEGATIVE Final    Comment: (NOTE) SARS-CoV-2 target nucleic acids are NOT DETECTED.  The SARS-CoV-2 RNA is generally detectable in upper respiratory specimens during the acute phase of infection. The lowest concentration of SARS-CoV-2 viral copies this assay can detect is 138 copies/mL. A negative result does not preclude SARS-Cov-2 infection and should not be used as the sole basis for treatment or other patient management decisions. A negative result may occur with  improper specimen collection/handling, submission of specimen other than nasopharyngeal swab, presence of viral mutation(s) within the areas targeted by this assay, and inadequate number of viral copies(<138 copies/mL). A negative result must be combined with clinical observations, patient history, and epidemiological information. The expected result is Negative.  Fact Sheet for Patients:  EntrepreneurPulse.com.au  Fact Sheet for Healthcare Providers:  IncredibleEmployment.be  This test is no t yet approved or cleared by the [place] FDA and  has been authorized for detection and/or diagnosis of SARS-CoV-2 by FDA under an Emergency Use Authorization (EUA). This EUA will remain  in effect (meaning this test can be used) for the duration of the COVID-19 declaration under Section 564(b)(1) of the Act, 21 U.S.C.section 360bbb-3(b)(1), unless the authorization is terminated  or revoked sooner.       Influenza A by PCR NEGATIVE NEGATIVE Final   Influenza B by PCR NEGATIVE NEGATIVE Final    Comment: (NOTE) The Xpert Xpress SARS-CoV-2/FLU/RSV plus assay is intended as an aid in the diagnosis of influenza from Nasopharyngeal swab specimens and should not be used as a sole basis for treatment. Nasal washings and aspirates  are unacceptable for Xpert Xpress SARS-CoV-2/FLU/RSV testing.  Fact Sheet for Patients: EntrepreneurPulse.com.au  Fact Sheet for Healthcare Providers: IncredibleEmployment.be  This test is not yet approved or cleared by the [place] FDA and has been authorized for detection and/or diagnosis of SARS-CoV-2 by FDA under an Emergency Use Authorization (EUA). This EUA will remain in effect (meaning this test can be used) for the duration of the COVID-19 declaration under Section 564(b)(1) of the Act, 21 U.S.C. section 360bbb-3(b)(1), unless the authorization is terminated or revoked.  Performed at [facility], [place]  [address], [place], [place] [address]   Blood culture (routine x 2)     Status: None (Preliminary result)   Collection Time: 06/01/20  5:32 PM   Specimen: BLOOD  Result Value Ref Range Status   Specimen Description BLOOD BLOOD RIGHT FOREARM  Final   Special Requests   Final    BOTTLES DRAWN AEROBIC AND ANAEROBIC Blood Culture adequate volume   Culture   Final    NO GROWTH 2 DAYS Performed at [facility], [place] [address] [place], [place] [address]    Report Status PENDING  Incomplete  Blood culture (routine x 2)     Status: None (Preliminary result)   Collection Time: 06/01/20  6:25 PM   Specimen: BLOOD RIGHT HAND  Result Value Ref Range Status   Specimen Description BLOOD RIGHT HAND  Final   Special Requests   Final    BOTTLES DRAWN AEROBIC AND ANAEROBIC Blood Culture results may not be optimal due to an inadequate volume of blood received in culture bottles   Culture   Final    NO GROWTH 2 DAYS Performed at [facility], [place] [address], [place],  [address]    Report Status PENDING  Incomplete         Radiology Studies: DG Knee 1-2 Views Left  Result Date: 06/03/2020 CLINICAL DATA:  80 year old male with fall and knee pain. EXAM: LEFT KNEE - 1-2 VIEW COMPARISON:  None. 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 Otherwise mild tricompartmental degenerative changes. Patella appears intact. On cross-table lateral views there is no significant joint effusion. No acute osseous abnormality identified. Mild calcified peripheral vascular disease. IMPRESSION: 1. No acute fracture or dislocation identified about the left knee. 2. 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 Electronically Signed   By: Genevie Ann M.D.   On: 06/03/2020 11:14   MR BRAIN WO CONTRAST  Result Date: 06/02/2020 CLINICAL DATA:  Fall.  Slurred speech.  Confusion. EXAM: MRI HEAD WITHOUT CONTRAST TECHNIQUE: Multiplanar, multiecho pulse sequences of the brain and surrounding structures were obtained without intravenous contrast. COMPARISON:  Head CT 06/01/2020 FINDINGS: The study is mildly motion degraded. Brain: There is no evidence of an acute infarct, intracranial hemorrhage, mass, midline shift, or extra-axial fluid collection. There is mild generalized cerebral atrophy. T2 hyperintensities in the cerebral white matter are nonspecific but compatible with minimal chronic small vessel ischemic disease, within normal limits for age. Vascular: Major intracranial vascular flow voids are preserved. Skull and upper cervical spine: Unremarkable bone marrow signal. Sinuses/Orbits: Unremarkable orbits. Paranasal sinuses and mastoid air cells are clear. Other: None. IMPRESSION: No acute intracranial abnormality. Electronically Signed   By: Logan Bores M.D.   On: 06/02/2020 11:32   DG Chest Portable 1 View  Result Date: 06/01/2020 CLINICAL DATA:  80 year old male with weakness. EXAM: PORTABLE CHEST 1 VIEW COMPARISON:  Chest radiograph dated 04/24/2017. FINDINGS: Shallow inspiration with left lung base atelectasis or infiltrate. There is no pleural effusion pneumothorax. The cardiac silhouette is within limits.  Atherosclerotic calcification of the aorta. Degenerative changes of spine. No acute osseous pathology. IMPRESSION: Shallow inspiration with left lung base atelectasis or infiltrate. Electronically Signed   By: Anner Crete M.D.   On: 06/01/2020 18:09   DG Ankle Left Port  Result Date: 06/03/2020 CLINICAL DATA:  Pain left leg and ankle. EXAM: PORTABLE LEFT ANKLE - 2 VIEW COMPARISON:  No prior. FINDINGS: Mild soft  tissue swelling Corticated bony density noted adjacent to the medial malleolus. This is most likely an old fracture fragment. No acute bony or joint abnormality identified. Peripheral vascular calcification. IMPRESSION: 1. Mild soft tissue swelling. Corticated bony density noted adjacent to the medial malleolus. This is most likely an old fracture fragment. No acute abnormality identified. 2.  Peripheral vascular disease. Electronically Signed   By: Marcello Moores  Register   On: 06/03/2020 15:06   DG Foot 2 Views Right  Result Date: 06/02/2020 CLINICAL DATA:  Right foot pain and swelling, initial encounter EXAM: RIGHT FOOT - 2 VIEW COMPARISON:  09/18/2019 FINDINGS: Generalized osteopenia is noted. Mild widening at the base of the second proximal phalanx is noted when compared with the prior exam suspicious for prior fracture and healing. No acute fracture is noted. Small calcaneal spurs are seen. IMPRESSION: No acute fracture noted. Changes suspicious for prior fracture with healing at the base of the second proximal phalanx. Generalized osteopenia is noted. Electronically Signed   By: Inez Catalina M.D.   On: 06/02/2020 18:30   Scheduled Meds: . calcium acetate  1,334 mg Oral TID WC  . Chlorhexidine Gluconate Cloth  6 each Topical Daily   Continuous Infusions: . azithromycin Stopped (06/03/20 1248)  . cefTRIAXone (ROCEPHIN)  IV 1 g (06/02/20 1925)  . lactated ringers 100 mL/hr at 06/02/20 1233     LOS: 2 days    Phillips Climes, MD [facility]  If 7PM-7AM, please contact  night-coverage [email or web address]  06/03/2020, 3:32 PM

## 2020-06-04 ENCOUNTER — Inpatient Hospital Stay (HOSPITAL_COMMUNITY): Payer: Medicare Other

## 2020-06-04 LAB — CBC
HCT: 23.5 % — ABNORMAL LOW (ref 39.0–52.0)
Hemoglobin: 8.2 g/dL — ABNORMAL LOW (ref 13.0–17.0)
MCH: 34.6 pg — ABNORMAL HIGH (ref 26.0–34.0)
MCHC: 34.9 g/dL (ref 30.0–36.0)
MCV: 99.2 fL (ref 80.0–100.0)
Platelets: 231 10*3/uL (ref 150–400)
RBC: 2.37 MIL/uL — ABNORMAL LOW (ref 4.22–5.81)
RDW: 15 % (ref 11.5–15.5)
WBC: 13.5 10*3/uL — ABNORMAL HIGH (ref 4.0–10.5)
nRBC: 0 % (ref 0.0–0.2)

## 2020-06-04 LAB — BASIC METABOLIC PANEL
Anion gap: 11 (ref 5–15)
BUN: 59 mg/dL — ABNORMAL HIGH (ref 8–23)
CO2: 17 mmol/L — ABNORMAL LOW (ref 22–32)
Calcium: 7.4 mg/dL — ABNORMAL LOW (ref 8.9–10.3)
Chloride: 113 mmol/L — ABNORMAL HIGH (ref 98–111)
Creatinine, Ser: 2.01 mg/dL — ABNORMAL HIGH (ref 0.61–1.24)
GFR, Estimated: 33 mL/min — ABNORMAL LOW (ref >60)
Glucose, Bld: 112 mg/dL — ABNORMAL HIGH (ref 70–99)
Potassium: 2.9 mmol/L — ABNORMAL LOW (ref 3.5–5.1)
Sodium: 141 mmol/L (ref 135–145)

## 2020-06-04 MED ORDER — POTASSIUM CHLORIDE 10 MEQ/100ML IV SOLN
10.0000 meq | INTRAVENOUS | Status: AC
Start: 2020-06-04 — End: 2020-06-04
  Administered 2020-06-04 (×4): 10 meq via INTRAVENOUS
  Filled 2020-06-04 (×4): qty 100

## 2020-06-04 MED ORDER — ONDANSETRON HCL 4 MG/2ML IJ SOLN
4.0000 mg | Freq: Four times a day (QID) | INTRAMUSCULAR | Status: DC | PRN
  Administered 2020-06-05 – 2020-06-07 (×5): 4 mg via INTRAVENOUS
  Filled 2020-06-04 (×5): qty 2

## 2020-06-04 MED ORDER — METOPROLOL SUCCINATE ER 25 MG PO TB24
25.0000 mg | ORAL_TABLET | Freq: Every day | ORAL | Status: DC
  Administered 2020-06-04 – 2020-06-08 (×5): 25 mg via ORAL
  Filled 2020-06-04 (×5): qty 1

## 2020-06-04 MED ORDER — POTASSIUM CHLORIDE CRYS ER 20 MEQ PO TBCR
40.0000 meq | EXTENDED_RELEASE_TABLET | Freq: Four times a day (QID) | ORAL | Status: AC
  Administered 2020-06-04 (×2): 40 meq via ORAL
  Filled 2020-06-04 (×2): qty 2

## 2020-06-04 MED ORDER — PANTOPRAZOLE SODIUM 40 MG PO TBEC
40.0000 mg | DELAYED_RELEASE_TABLET | Freq: Every day | ORAL | Status: DC
  Administered 2020-06-04 – 2020-06-08 (×5): 40 mg via ORAL
  Filled 2020-06-04 (×5): qty 1

## 2020-06-04 MED ORDER — OXYCODONE-ACETAMINOPHEN 5-325 MG PO TABS
1.0000 | ORAL_TABLET | Freq: Four times a day (QID) | ORAL | Status: DC | PRN
  Administered 2020-06-04 – 2020-06-08 (×12): 1 via ORAL
  Filled 2020-06-04 (×12): qty 1

## 2020-06-04 MED ORDER — KETOCONAZOLE 2 % EX SHAM
MEDICATED_SHAMPOO | CUTANEOUS | Status: DC
  Filled 2020-06-04: qty 120

## 2020-06-04 MED ORDER — TAMSULOSIN HCL 0.4 MG PO CAPS
0.4000 mg | ORAL_CAPSULE | Freq: Every day | ORAL | Status: DC
  Administered 2020-06-04 – 2020-06-05 (×2): 0.4 mg via ORAL
  Filled 2020-06-04 (×2): qty 1

## 2020-06-04 MED ORDER — KETOCONAZOLE 2 % EX CREA
TOPICAL_CREAM | Freq: Two times a day (BID) | CUTANEOUS | Status: DC
  Filled 2020-06-04 (×2): qty 15

## 2020-06-04 NOTE — Evaluation (Signed)
Physical Therapy Evaluation Patient Details Name: Marvin Morrison MRN: [id number] DOB: 1940/12/02 Today's Date: 06/04/2020   History of Present Illness  Pt is an 80 year old man admitted with N/V/D and AMS on 06/02/20. +AKI on CKD IIIb.  Head MRI negative for acute changes. PMH: HTN, HLD, urinary retention, chronic anemia, arthritis, chronic foot ulcers.  Clinical Impression   Patient received in bed, immediately agitated at therapist. Refused EOB/OOB but agreeable to bed level evaluation- but even that was limited as he began to vomit immediately after attempting MMT. Estimated BLE strength 2+ to 3/5 at best. Unsure of baseline cognitive status- was laying in liquid bowel movement with seemingly no awareness until PT brought it up, and yelled at me not touch his left leg even when I was standing over 19f away from it but then tolerated pressure of 3 stacked blankets on it when I was distracting him. Left in bed with all needs met, bed alarm active and nursing staff aware of patient status. Will definitely benefit from SNF and 24/7A moving forward.     Follow Up Recommendations SNF;Supervision/Assistance - 24 hour    Equipment Recommendations  Rolling walker with 5" wheels;3in1 (PT);Wheelchair (measurements PT);Wheelchair cushion (measurements PT)    Recommendations for Other Services       Precautions / Restrictions Precautions Precautions: Fall Restrictions Weight Bearing Restrictions: No      Mobility  Bed Mobility Overal bed mobility: Needs Assistance Bed Mobility: Supine to Sit;Sit to Supine     Supine to sit: Mod assist Sit to supine: Mod assist   General bed mobility comments: refused    Transfers                 General transfer comment: refused  Ambulation/Gait             General Gait Details: refused  Stairs            Wheelchair Mobility    Modified Rankin (Stroke Patients Only)       Balance Overall balance assessment: Needs  assistance Sitting-balance support: Bilateral upper extremity supported;Feet supported Sitting balance-Leahy Scale: Fair (per OT eval)                                       Pertinent Vitals/Pain Pain Assessment: Faces Faces Pain Scale: Hurts whole lot Pain Location: feet L>R Pain Descriptors / Indicators: Grimacing;Guarding;Discomfort Pain Intervention(s): Limited activity within patient's tolerance;Monitored during session    Home Living Family/patient expects to be discharged to:: Private residence Living Arrangements: Spouse/significant other Available Help at Discharge: Family;Available 24 hours/day Type of Home: House Home Access: Stairs to enter   [employer] Steps: 2 Home Layout: Two level;1/2 bath on main level;Able to live on main level with bedroom/bathroom Home Equipment: Wheelchair - manual Additional Comments: pt is a questionable historian, he does report his wife states she cannot take care of pt at home any longer    Prior Function Level of Independence: Needs assistance   Gait / Transfers Assistance Needed: transfers and propels w/c as his means of mobility  ADL's / Homemaking Assistance Needed: can dress, self feed, groom and toilet independently per pt, wife manages IADL        Hand Dominance   Dominant Hand: Right    Extremity/Trunk Assessment   Upper Extremity Assessment Upper Extremity Assessment: Defer to OT evaluation    Lower Extremity Assessment  Lower Extremity Assessment: Generalized weakness (3/5 R LE, 2+ to 3-/5 L LE at best)    Cervical / Trunk Assessment Cervical / Trunk Assessment: Kyphotic  Communication   Communication: HOH  Cognition Arousal/Alertness: Awake/alert Behavior During Therapy: Agitated Overall Cognitive Status: No family/caregiver present to determine baseline cognitive functioning                                 General Comments: agitated with therapist, yells "I won't  make any bargains with you". Became agitated and yelled not to touch his left foot even when I was standing over 99f away from his foot. States that he was doing fine before he fell. Laying in bowel movement with seemingly no awareness and tells me that he told [place] days ago he had a BM.      General Comments      Exercises     Assessment/Plan    PT Assessment Patient needs continued PT services  PT Problem List Decreased strength;Decreased cognition;Decreased knowledge of use of DME;Decreased activity tolerance;Decreased safety awareness;Decreased balance;Pain;Decreased mobility;Decreased coordination       PT Treatment Interventions DME instruction;Balance training;Gait training;Stair training;Cognitive remediation;Functional mobility training;Patient/family education;Therapeutic activities;Therapeutic exercise;Wheelchair mobility training;Manual techniques    PT Goals (Current goals can be found in the Care Plan section)  Acute Rehab PT Goals Patient Stated Goal: my wife said I have to go to a home PT Goal Formulation: With patient Time For Goal Achievement: 06/18/20 Potential to Achieve Goals: Fair    Frequency Min 2X/week   Barriers to discharge        Co-evaluation               AM-PAC PT "6 Clicks" Mobility  Outcome Measure Help needed turning from your back to your side while in a flat bed without using bedrails?: A Lot Help needed moving from lying on your back to sitting on the side of a flat bed without using bedrails?: A Lot Help needed moving to and from a bed to a chair (including a wheelchair)?: Total Help needed standing up from a chair using your arms (e.g., wheelchair or bedside chair)?: Total Help needed to walk in hospital room?: Total Help needed climbing 3-5 steps with a railing? : Total 6 Click Score: 8    End of Session   Activity Tolerance: Treatment limited secondary to agitation Patient left: in bed;with call bell/phone within reach;with  bed alarm set Nurse Communication: Mobility status PT Visit Diagnosis: Unsteadiness on feet (R26.81);Difficulty in walking, not elsewhere classified (R26.2);Muscle weakness (generalized) (M62.81);History of falling (Z91.81);Pain Pain - Right/Left:  (bilateral) Pain - part of body: Leg    Time: 1456-1520 PT Time Calculation (min) (ACUTE ONLY): 24 min   Charges:   PT Evaluation $PT Eval Moderate Complexity: 1 Mod PT Treatments $Therapeutic Exercise: 8-22 mins        KWindell Norfolk DPT, PN1   Supplemental Physical Therapist [place]   Pager [phone] Rehab Office [phone]

## 2020-06-04 NOTE — Progress Notes (Signed)
Noted blood in patient's urine. Dr. Waldron Labs notified.

## 2020-06-04 NOTE — Evaluation (Signed)
Occupational Therapy Evaluation Patient Details Name: Marvin Morrison MRN: [id number] DOB: 01-09-1940 Today's Date: 06/04/2020    History of Present Illness Pt is an 80 year old man admitted with N/V/D and AMS on 06/02/20. +AKI on CKD IIIb.  Head MRI negative for acute changes. PMH: HTN, HLD, urinary retention, chronic anemia, arthritis, chronic foot ulcers.   Clinical Impression   Pt reports being able to transfer and propel his manual w/c prior to admission. He sponge bathed since he stays on the first floor of his home and there is only a half bath. He reports being able to toilet and dress himself. He relied on his wife for IADL. Per chart, daughter reports pt's wife cannot care for pt at home adequately. Pt presents with generalized weakness, nausea and vomiting during session and painful feet (unable to tolerate socks). He requires set up to total assist for ADL. Recommending SNF upon discharge.     Follow Up Recommendations  SNF;Supervision/Assistance - 24 hour    Equipment Recommendations  None recommended by OT    Recommendations for Other Services       Precautions / Restrictions Precautions Precautions: Fall      Mobility Bed Mobility Overal bed mobility: Needs Assistance Bed Mobility: Supine to Sit;Sit to Supine     Supine to sit: Mod assist Sit to supine: Mod assist   General bed mobility comments: increased time, assist for L LE over EOb and to raise trunk, assist for LEs back into bed    Transfers                 General transfer comment: deferred, pt unable to tolerate socks on his feet    Balance Overall balance assessment: Needs assistance Sitting-balance support: Bilateral upper extremity supported;Feet supported Sitting balance-Leahy Scale: Fair                                     ADL either performed or assessed with clinical judgement   ADL Overall ADL's : Needs assistance/impaired Eating/Feeding: Set up;Bed level    Grooming: Wash/dry hands;Wash/dry face;Sitting;Set up   Upper Body Bathing: Moderate assistance;Sitting   Lower Body Bathing: Total assistance;Bed level   Upper Body Dressing : Minimal assistance;Bed level   Lower Body Dressing: Total assistance;Bed level                       Vision Patient Visual Report: No change from baseline       Perception     Praxis      Pertinent Vitals/Pain Pain Assessment: Faces Faces Pain Scale: Hurts whole lot Pain Location: feet Pain Descriptors / Indicators: Grimacing;Guarding;Discomfort Pain Intervention(s): Repositioned;Monitored during session     Hand Dominance Right   Extremity/Trunk Assessment Upper Extremity Assessment Upper Extremity Assessment: Generalized weakness   Lower Extremity Assessment Lower Extremity Assessment: Defer to PT evaluation   Cervical / Trunk Assessment Cervical / Trunk Assessment: Kyphotic   Communication Communication Communication: HOH   Cognition Arousal/Alertness: Awake/alert Behavior During Therapy: WFL for tasks assessed/performed Overall Cognitive Status: No family/caregiver present to determine baseline cognitive functioning                                     General Comments       Exercises     Shoulder Instructions  Home Living Family/patient expects to be discharged to:: Private residence Living Arrangements: Spouse/significant other Available Help at Discharge: Family;Available 24 hours/day Type of Home: House Home Access: Stairs to enter [employer] of Steps: 2   Home Layout: Two level;1/2 bath on main level;Able to live on main level with bedroom/bathroom     Bathroom Shower/Tub: Other (comment) (spong bathes)         Home Equipment: Wheelchair - manual   Additional Comments: pt is a questionable historian, he does report his wife states she cannot take care of pt at home any longer      Prior Functioning/Environment Level  of Independence: Needs assistance  Gait / Transfers Assistance Needed: transfers and propels w/c as his means of mobility ADL's / Homemaking Assistance Needed: can dress, self feed, groom and toilet independently per pt, wife manages IADL            OT Problem List: Decreased strength;Decreased activity tolerance;Impaired balance (sitting and/or standing);Decreased cognition;Pain      OT Treatment/Interventions: Self-care/ADL training;DME and/or AE instruction;Cognitive remediation/compensation;Patient/family education;Balance training    OT Goals(Current goals can be found in the care plan section) Acute Rehab OT Goals Patient Stated Goal: my wife said I have to go to a home OT Goal Formulation: With patient Time For Goal Achievement: 06/18/20 Potential to Achieve Goals: Fair ADL Goals Pt Will Perform Grooming: with supervision;sitting Pt Will Perform Upper Body Bathing: with min assist;sitting Pt Will Perform Upper Body Dressing: with supervision;sitting Pt Will Transfer to Toilet: with mod assist;stand pivot transfer;bedside commode  OT Frequency: Min 2X/week   Barriers to D/C: Decreased caregiver support          Co-evaluation              AM-PAC OT "6 Clicks" Daily Activity     Outcome Measure Help from another person eating meals?: A Little Help from another person taking care of personal grooming?: A Little Help from another person toileting, which includes using toliet, bedpan, or urinal?: Total Help from another person bathing (including washing, rinsing, drying)?: A Lot Help from another person to put on and taking off regular upper body clothing?: A Little Help from another person to put on and taking off regular lower body clothing?: Total 6 Click Score: 13   End of Session Nurse Communication: Other (comment)  Activity Tolerance: Treatment limited secondary to medical complications (Comment);Patient limited by pain (vomiting) Patient left: in bed;with  call bell/phone within reach;with bed alarm set  OT Visit Diagnosis: Muscle weakness (generalized) (M62.81);Cognitive communication deficit (R41.841);Pain                Time: 6712-4580 OT Time Calculation (min): 22 min Charges:  OT General Charges $OT Visit: 1 Visit OT Evaluation $OT Eval Moderate Complexity: 1 Mod  Nestor Lewandowsky, OTR/L Acute Rehabilitation Services Pager: [phone] Office: [phone] Malka So 06/04/2020, 1:56 PM

## 2020-06-04 NOTE — Progress Notes (Signed)
PROGRESS NOTE    Marvin Morrison  EUM:[id number] DOB: December 31, 1940 DOA: 06/01/2020 PCP: Cassandria Anger, MD   Brief Narrative:   Marvin Morrison is a 80 y.o. male with medical history significant for hypertension, hyperlipidemia, stage IIIb chronic kidney disease with baseline creatinine 1.5-1.8, urinary retention, chronic anemia with baseline hemoglobin of 12, who is admitted to [facility] on 06/01/2020 with acute kidney injury superimposed on stage IIIb chronic kidney disease after presenting from home to [facility] ED complaining of slurred speech. Patient's wife reports that at 1400 on 05/31/2020 that the patient fell while ambulating at their home without loss of consciousness but exhibiting evidence of new onset slurred speech, without associated evidence of facial droop.  She reports that he also appeared confused relative to his baseline mental status, and she notes that patient was at his baseline level of health leading up to this.   Subjective:  No acute issues or events overnight,he is still complaining of left ankle pain, denies any fever, chills,  Assessment & Plan:   Principal Problem:   AKI (acute kidney injury) ([place]) Active Problems:   Hyperlipidemia   Essential hypertension   Urinary retention   Heme positive stool   Acute encephalopathy   High anion gap metabolic acidosis   Leukocytosis   Acute on chronic anemia   GERD (gastroesophageal reflux disease)   Acute metabolic encephalopathy likely in the setting of AKI/uremia. -Altered mental status most likely in the setting of uremia due to renal failure, with BUN significantly elevated 114 on admission, as well most likely in the setting of worsening renal failure while using gabapentin. - MRI which was unremarkable for any acute findings stroke bleed or tumor.   - mental status continue to improve  AKI on CKD3B -Unclear etiology, wife is a somewhat poor historian but indicates poor p.o. intake and the patient -  The patient is essentially nonresponsive after Ativan for MRI  -Creatinine improved with IV fluid, continue to avoid nephrotoxic medications. -Kidney function and BUN improving  Leukocytosis -Likely reactive, procalcitonin negative -No evidence of infection, will discontinue antibiotics.  Right second toe ulcer -Podiatry input greatly appreciated, it does appear patient having some exposed bones due to ulcer, recommendation for second toe amputation, patient and family currently declined   FOBT with anemia  -GI following, patient/family declined endoscopy/colonoscopy, continue with PPI.  Left ankle pain -Ray with no acute fracture, but patient had old fracture in that area, will start on some as needed Percocet  Hypokalemia  -Severe, repleted again today.  AGMA -secondary to uremia -As above unclear if completely related to patient's renal decline or questionably upper GI bleed given elevated BUN out of proportion with creatinine   Severe protein caloric malnutrition -Continue to encourage p.o. intake once able to tolerate p.o. safely  Profound medication and medical noncompliance/goals of care -Patient with severe noncompliance, does not like to follow with the physicians, or seek any medical intervention, as discussed with wife and daughter at bedside, patient with overall poor life quality, and he would not like any escalation of care at any point, they were not interested in any endoscopy, colonoscopy, or any intervention regarding his toe ulcer, and they do inquire about hospice, I will consult palliative medicine regarding goals of care discussion, and disposition, and it is clear that wife will not be able to take care of her husband at home.  DVT prophylaxis: SCDs only given questionable GI bleed as above Code Status: DNR, confirmed with daughter  and wife at bedside -this is a code change since admission.  Patient apparently has living will and would not want any aggressive  medical care including CPR, intubation. Family Communication: Discussed with daughter and wife   Status is: Inpatient  Dispo: The patient is from: Home              Anticipated d/c is to: To be determined              Anticipated d/c date is: >72h              Patient currently not medically stable for discharge  Consultants:   GI  Podiatry  Procedures:   None  Antimicrobials:  Azithromycin, ceftriaxone    Objective: Vitals:   06/04/20 0400 06/04/20 0454 06/04/20 0723 06/04/20 1217  BP: 111/69  (!) 141/84 (!) 142/82  Pulse: 89  97 95  Resp: 15  18 18   Temp: 97.8 F (36.6 C)  (!) 97.4 F (36.3 C) 98.4 F (36.9 C)  TempSrc: Axillary  Oral Oral  SpO2: 98%  97% 98%  Weight:  81.1 kg    Height:        Intake/Output Summary (Last 24 hours) at 06/04/2020 1230 Last data filed at 06/04/2020 0521 Gross per 24 hour  Intake 368.87 ml  Output 2150 ml  Net -1781.13 ml   Filed Weights   06/01/20 1446 06/02/20 0456 06/04/20 0454  Weight: 81.6 kg 80.3 kg 81.1 kg    Examination:  Awake Alert, Oriented X 3, deconditioned, frail ,no new F.N deficits, Normal affect Symmetrical Chest wall movement, Good air movement bilaterally, CTAB RRR,No Gallops,Rubs or new Murmurs, No Parasternal Heave +ve B.Sounds, Abd Soft, No tenderness, No rebound - guarding or rigidity. No Cyanosis, patient with tenderness to palpation in the left ankle area(inconsistent), left foot wrapped, with second toe wound, but no bleeding or discharge.     Data Reviewed: I have personally reviewed following labs and imaging studies  CBC: Recent Labs  Lab 06/01/20 1455 06/02/20 0051 06/03/20 0046 06/04/20 0132  WBC 23.9* 20.2* 16.2* 13.5*  NEUTROABS 20.4* 17.3*  --   --   HGB 9.8* 9.7* 8.8* 8.2*  HCT 28.7* 27.7* 24.4* 23.5*  MCV 102.5* 99.6 98.0 99.2  PLT 291 253 253 347   Basic Metabolic Panel: Recent Labs  Lab 06/01/20 2049 06/02/20 0051 06/03/20 0046 06/03/20 1723 06/04/20 0132  NA 136  135 141 141 141  K 3.0* 2.8* 2.7* 2.7* 2.9*  CL 107 105 110 114* 113*  CO2 12* 12* 14* 17* 17*  GLUCOSE 156* 147* 144* 116* 112*  BUN 114* 111* 87* 66* 59*  CREATININE 4.43* 4.41* 3.01* 2.31* 2.01*  CALCIUM 8.6* 8.3* 7.7* 7.3* 7.4*  MG 2.8* 2.8* 2.3  --   --   PHOS  --  6.8*  --   --   --    GFR: Estimated Creatinine Clearance: 31.6 mL/min (A) (by C-G formula based on SCr of 2.01 mg/dL (H)). Liver Function Tests: Recent Labs  Lab 06/01/20 1455 06/02/20 0051  AST 14* 15  ALT 15 16  ALKPHOS 51 48  BILITOT 0.7 0.5  PROT 6.3* 5.7*  ALBUMIN 3.0* 2.8*   No results for input(s): LIPASE, AMYLASE in the last 168 hours. Recent Labs  Lab 06/01/20 2049  AMMONIA 26   Coagulation Profile: Recent Labs  Lab 06/01/20 1509 06/02/20 0051  INR 1.4* 1.3*   Cardiac Enzymes: Recent Labs  Lab 06/01/20 2049  CKTOTAL 61  BNP (last 3 results) No results for input(s): PROBNP in the last 8760 hours. HbA1C: No results for input(s): HGBA1C in the last 72 hours. CBG: No results for input(s): GLUCAP in the last 168 hours. Lipid Profile: No results for input(s): CHOL, HDL, LDLCALC, TRIG, CHOLHDL, LDLDIRECT in the last 72 hours. Thyroid Function Tests: Recent Labs    06/01/20 2049  TSH 1.083   Anemia Panel: Recent Labs    06/01/20 2049  FOLATE 33.4  FERRITIN 241  TIBC 224*  IRON 56  RETICCTPCT 2.9   Sepsis Labs: Recent Labs  Lab 06/01/20 2049  PROCALCITON 0.77    Recent Results (from the past 240 hour(s))  Resp Panel by RT-PCR (Flu A&B, Covid) Nasopharyngeal Swab     Status: None   Collection Time: 06/01/20  3:54 PM   Specimen: Nasopharyngeal Swab; Nasopharyngeal(NP) swabs in vial transport medium  Result Value Ref Range Status   SARS Coronavirus 2 by RT PCR NEGATIVE NEGATIVE Final    Comment: (NOTE) SARS-CoV-2 target nucleic acids are NOT DETECTED.  The SARS-CoV-2 RNA is generally detectable in upper respiratory specimens during the acute phase of infection. The  lowest concentration of SARS-CoV-2 viral copies this assay can detect is 138 copies/mL. A negative result does not preclude SARS-Cov-2 infection and should not be used as the sole basis for treatment or other patient management decisions. A negative result may occur with  improper specimen collection/handling, submission of specimen other than nasopharyngeal swab, presence of viral mutation(s) within the areas targeted by this assay, and inadequate number of viral copies(<138 copies/mL). A negative result must be combined with clinical observations, patient history, and epidemiological information. The expected result is Negative.  Fact Sheet for Patients:  EntrepreneurPulse.com.au  Fact Sheet for Healthcare Providers:  IncredibleEmployment.be  This test is no t yet approved or cleared by the [place] FDA and  has been authorized for detection and/or diagnosis of SARS-CoV-2 by FDA under an Emergency Use Authorization (EUA). This EUA will remain  in effect (meaning this test can be used) for the duration of the COVID-19 declaration under Section 564(b)(1) of the Act, 21 U.S.C.section 360bbb-3(b)(1), unless the authorization is terminated  or revoked sooner.       Influenza A by PCR NEGATIVE NEGATIVE Final   Influenza B by PCR NEGATIVE NEGATIVE Final    Comment: (NOTE) The Xpert Xpress SARS-CoV-2/FLU/RSV plus assay is intended as an aid in the diagnosis of influenza from Nasopharyngeal swab specimens and should not be used as a sole basis for treatment. Nasal washings and aspirates are unacceptable for Xpert Xpress SARS-CoV-2/FLU/RSV testing.  Fact Sheet for Patients: EntrepreneurPulse.com.au  Fact Sheet for Healthcare Providers: IncredibleEmployment.be  This test is not yet approved or cleared by the [place] FDA and has been authorized for detection and/or diagnosis of SARS-CoV-2 by FDA under  an Emergency Use Authorization (EUA). This EUA will remain in effect (meaning this test can be used) for the duration of the COVID-19 declaration under Section 564(b)(1) of the Act, 21 U.S.C. section 360bbb-3(b)(1), unless the authorization is terminated or revoked.  Performed at [facility], [place] [address] [place], [place] [address]   Blood culture (routine x 2)     Status: None (Preliminary result)   Collection Time: 06/01/20  5:32 PM   Specimen: BLOOD  Result Value Ref Range Status   Specimen Description BLOOD BLOOD RIGHT FOREARM  Final   Special Requests   Final    BOTTLES DRAWN AEROBIC AND ANAEROBIC Blood  Culture adequate volume   Culture   Final    NO GROWTH 3 DAYS Performed at [facility], [place] [address], [place], [place] [address]    Report Status PENDING  Incomplete  Blood culture (routine x 2)     Status: None (Preliminary result)   Collection Time: 06/01/20  6:25 PM   Specimen: BLOOD RIGHT HAND  Result Value Ref Range Status   Specimen Description BLOOD RIGHT HAND  Final   Special Requests   Final    BOTTLES DRAWN AEROBIC AND ANAEROBIC Blood Culture results may not be optimal due to an inadequate volume of blood received in culture bottles   Culture   Final    NO GROWTH 3 DAYS Performed at [facility], [place] [address], [place], [place] [address]    Report Status PENDING  Incomplete         Radiology Studies: DG Knee 1-2 Views Left  Result Date: 06/03/2020 CLINICAL DATA:  80 year old male with fall and knee pain. EXAM: LEFT KNEE - 1-2 VIEW COMPARISON:  None. 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 Otherwise mild tricompartmental degenerative changes. Patella appears intact. On cross-table lateral views there is no significant joint effusion. No acute osseous abnormality identified. Mild calcified peripheral vascular disease. IMPRESSION: 1. No acute fracture or dislocation  identified about the left knee. 2. 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 Electronically Signed   By: Genevie Ann M.D.   On: 06/03/2020 11:14   DG Ankle Left Port  Result Date: 06/03/2020 CLINICAL DATA:  Pain left leg and ankle. EXAM: PORTABLE LEFT ANKLE - 2 VIEW COMPARISON:  No prior. FINDINGS: Mild soft tissue swelling Corticated bony density noted adjacent to the medial malleolus. This is most likely an old fracture fragment. No acute bony or joint abnormality identified. Peripheral vascular calcification. IMPRESSION: 1. Mild soft tissue swelling. Corticated bony density noted adjacent to the medial malleolus. This is most likely an old fracture fragment. No acute abnormality identified. 2.  Peripheral vascular disease. Electronically Signed   By: Marcello Moores  Register   On: 06/03/2020 15:06   DG Foot 2 Views Right  Result Date: 06/02/2020 CLINICAL DATA:  Right foot pain and swelling, initial encounter EXAM: RIGHT FOOT - 2 VIEW COMPARISON:  09/18/2019 FINDINGS: Generalized osteopenia is noted. Mild widening at the base of the second proximal phalanx is noted when compared with the prior exam suspicious for prior fracture and healing. No acute fracture is noted. Small calcaneal spurs are seen. IMPRESSION: No acute fracture noted. Changes suspicious for prior fracture with healing at the base of the second proximal phalanx. Generalized osteopenia is noted. Electronically Signed   By: Inez Catalina M.D.   On: 06/02/2020 18:30   Scheduled Meds: . calcium acetate  1,334 mg Oral TID WC  . Chlorhexidine Gluconate Cloth  6 each Topical Daily  . ketoconazole   Topical BID  . ketoconazole   Topical Once per day on Mon Thu  . potassium chloride  40 mEq Oral Q6H   Continuous Infusions: . azithromycin 500 mg (06/04/20 0434)  . cefTRIAXone (ROCEPHIN)  IV 1 g (06/03/20 1750)  . lactated ringers 100 mL/hr at 06/02/20 1233  . potassium chloride 10 mEq (06/04/20  1137)     LOS: 3 days    Phillips Climes, MD [facility]  If 7PM-7AM, please contact night-coverage [email or web address]  06/04/2020, 12:30 PM

## 2020-06-05 DIAGNOSIS — Z515 Encounter for palliative care: Secondary | ICD-10-CM

## 2020-06-05 DIAGNOSIS — Z66 Do not resuscitate: Secondary | ICD-10-CM

## 2020-06-05 DIAGNOSIS — Z7189 Other specified counseling: Secondary | ICD-10-CM

## 2020-06-05 LAB — BASIC METABOLIC PANEL
Anion gap: 12 (ref 5–15)
BUN: 39 mg/dL — ABNORMAL HIGH (ref 8–23)
CO2: 16 mmol/L — ABNORMAL LOW (ref 22–32)
Calcium: 8 mg/dL — ABNORMAL LOW (ref 8.9–10.3)
Chloride: 114 mmol/L — ABNORMAL HIGH (ref 98–111)
Creatinine, Ser: 1.64 mg/dL — ABNORMAL HIGH (ref 0.61–1.24)
GFR, Estimated: 42 mL/min — ABNORMAL LOW (ref >60)
Glucose, Bld: 112 mg/dL — ABNORMAL HIGH (ref 70–99)
Potassium: 3.3 mmol/L — ABNORMAL LOW (ref 3.5–5.1)
Sodium: 142 mmol/L (ref 135–145)

## 2020-06-05 LAB — CBC
HCT: 23.1 % — ABNORMAL LOW (ref 39.0–52.0)
Hemoglobin: 8.1 g/dL — ABNORMAL LOW (ref 13.0–17.0)
MCH: 35.2 pg — ABNORMAL HIGH (ref 26.0–34.0)
MCHC: 35.1 g/dL (ref 30.0–36.0)
MCV: 100.4 fL — ABNORMAL HIGH (ref 80.0–100.0)
Platelets: 244 10*3/uL (ref 150–400)
RBC: 2.3 MIL/uL — ABNORMAL LOW (ref 4.22–5.81)
RDW: 15.1 % (ref 11.5–15.5)
WBC: 13.4 10*3/uL — ABNORMAL HIGH (ref 4.0–10.5)
nRBC: 0 % (ref 0.0–0.2)

## 2020-06-05 MED ORDER — BIOTENE DRY MOUTH MT LIQD: 15.0000 mL | OROMUCOSAL | Status: DC | PRN

## 2020-06-05 MED ORDER — POLYVINYL ALCOHOL 1.4 % OP SOLN
1.0000 [drp] | Freq: Four times a day (QID) | OPHTHALMIC | Status: DC | PRN
  Filled 2020-06-05: qty 15

## 2020-06-05 MED ORDER — GLYCOPYRROLATE 0.2 MG/ML IJ SOLN
0.2000 mg | INTRAMUSCULAR | Status: DC | PRN
  Administered 2020-06-06: 0.2 mg via INTRAVENOUS
  Filled 2020-06-05: qty 1

## 2020-06-05 MED ORDER — TRAZODONE HCL 50 MG PO TABS
50.0000 mg | ORAL_TABLET | Freq: Every day | ORAL | Status: DC
  Administered 2020-06-05 – 2020-06-07 (×3): 50 mg via ORAL
  Filled 2020-06-05 (×3): qty 1

## 2020-06-05 MED ORDER — GLYCOPYRROLATE 0.2 MG/ML IJ SOLN: 0.2000 mg | INTRAMUSCULAR | Status: DC | PRN

## 2020-06-05 MED ORDER — LORAZEPAM 2 MG/ML IJ SOLN
1.0000 mg | Freq: Four times a day (QID) | INTRAMUSCULAR | Status: DC
  Administered 2020-06-05 – 2020-06-08 (×11): 1 mg via INTRAVENOUS
  Filled 2020-06-05 (×11): qty 1

## 2020-06-05 MED ORDER — GLYCOPYRROLATE 1 MG PO TABS
1.0000 mg | ORAL_TABLET | ORAL | Status: DC | PRN
  Filled 2020-06-05: qty 1

## 2020-06-05 MED ORDER — POTASSIUM CHLORIDE CRYS ER 20 MEQ PO TBCR
40.0000 meq | EXTENDED_RELEASE_TABLET | Freq: Four times a day (QID) | ORAL | Status: AC
  Administered 2020-06-05: 40 meq via ORAL
  Filled 2020-06-05: qty 2

## 2020-06-05 NOTE — Progress Notes (Signed)
This chaplain responded to the PMT consult for EOL spiritual care and the Pt. request for Last Rites.  The Pt. clarified he does not want the priest to be present until "he is actively dying."  The Pt. added, "then when I am dying, let me die."  To honor the Pt. wishes please page spiritual care at the time that best fits the Pt. description and request for a [employer] priest visit.  The chaplain is available for F/U spiritual care as needed.

## 2020-06-05 NOTE — Consult Note (Signed)
Palliative Medicine Inpatient Consult Note  Reason for consult:  Goals of Care "GOALS OF CARE, DISPOSITION"  HPI:  Per intake H&P --> Marvin Morrison a 80 y.o.malewith medical history significant forhypertension, hyperlipidemia, stage IIIb chronic kidney disease with baseline creatinine 1.5-1.8, urinary retention, chronic anemia with baseline hemoglobin of 12,who is admitted to [facility] on 5/30/2022with acute kidney injury superimposed on stage IIIb chronic kidney diseaseafter presenting from home to [facility] ED complaining of slurred speech. Patient's wife reports that at 1400 on 05/31/2020 that the patient fell while ambulating at their home without loss of consciousness but exhibiting evidence of new onset slurred speech,without associated evidence of facial droop. She reports that he also appeared confused relative to his baseline mental status,and she notes that patient was at his baseline level of health leading up to this.  Clinical Assessment/Goals of Care:  *Please note that this is a verbal dictation therefore any spelling or grammatical errors are due to the "[place] [employer]" system interpretation.  I have reviewed medical records including EPIC notes, labs and imaging, received report from bedside RN, assessed the patient is lying in bed in no acute distress..    I met with Marvin Morrison, his wife Marvin Morrison, and his daughter Marvin Morrison to further discuss diagnosis prognosis, GOC, EOL wishes, disposition and options.   I introduced Palliative Medicine as specialized medical care for people living with serious illness. It focuses on providing relief from the symptoms and stress of a serious illness. The goal is to improve quality of life for both the patient and the family.  Marvin Morrison shares with me that he is originally from [place], [place].  He has been married to his wife, Marvin Morrison.  They share 1 daughter, Marvin Morrison. He enrolled in the [employer] at 80 years old and was sent to  [place] to aid in support, he mentions being "behind the iron curtain".  He later worked as the Marketing executive for [employer] he and his family have lived in [place], [place] in [place].  Prior to admission Marvin Morrison had been confined to a recliner for the past 4 years after a spinal surgery.  He has been doing very poorly in the home requiring assistance with all basic activities of daily living.  He never did well postoperatively with physical therapy and Occupational Therapy.  His family is tried on multiple occasions to have hired help come into the home all of which he fires.  Per Marvin Morrison is grossly noncompliant with all medical advice and is neglectful of his own self-care. Marvin Morrison shares with me that she is no longer in a position where she could care for Marvin Morrison.  From nutritional perspective Marvin Morrison has been eating and drinking very little as he is "unable to keep anything down".  Completed a brief review of prior medical conditions inclusive of stage III chronic kidney disease, colitis, arthritis, hyperlipidemia.  The patient's active medical problems inclusive of his kidney injury due to uremia.  Reviewed patient's severe protein calorie deficit and what this means for his overall clinical picture.   A detailed discussion was had today regarding advanced directives - we have none on file patient's daughter Marvin Morrison plans to meet with a lawyer this week for confirmation of these documents.    Concepts specific to code status, artifical feeding and hydration, continued IV antibiotics and rehospitalization was had.  It is very clear if he were to die suddenly he would not wish  for any resuscitation effort.  We reviewed and summarized in the past his noncompliance with offered medical treatments and care which he endorses that he wants to do "what he wants to do".   The difference between a aggressive medical intervention path  and a  palliative comfort care path for this patient at this time was had.  Patient shares that he has lived for 19 years and it has been a very good 55 years.  He has lived "a very full life".  We reviewed that his chronic comorbidities are coming to a head and face with a decision whether to continue with aggressive modalities of care or to transition her focus to more of a comfort emphasis.  Reviewed what comfort looks like in house in great detail.  Both patient, his daughter, and his wife all agreed to pursue comfort measures.  A MOST was completed to reflect this.  Primary team informed via secure chat of comprehensive conversation.  Transitions of care team have been alerted to aid in a custodial bed placement with hospice care.  A symptom perspective I have initiated Ativan 1 mg every 6 hours around-the-clock given that patient has severe anxiety.  Have added trazodone 50 mg nightly for complaints of insomnia.  Decision Maker: Marvin Morrison (daughter): [phone] SUMMARY OF RECOMMENDATIONS   DNAR/DNI  MOST Completed, paper copy placed onto the chart electric copy can be found in [facility]  DNR Form Completed, paper copy placed onto the chart electric copy can be found in [facility]  Comfort focused care  Comfort medications per [facility]   Appreciate spiritual care arranging for Catholic priest to come and deliver last rites  Unrestricted visitation  Appreciate transitions of care team aiding in finding a facility to meet Marvin Morrison's healthcare needs  Placed to transitions of care consult for hospice  Code Status/Advance Care Planning: DNAR/DNI    Palliative Prophylaxis:   Oral Care, Turn Q2H, Delirium & Aspiration precuations  Additional Recommendations (Limitations, Scope, Preferences):  Comfort care  Psycho-social/Spiritual:   Desire for further Chaplaincy support:  Yes, patient is Catholic  Additional Recommendations:  Acacian on end-of-life process   Prognosis:  Prognosis is grim  in the setting of failure to thrive, severe protein calorie deficit, terminally ill frail state.  Hospice is appropriate at this point time  Discharge Planning:  Discharge to facility with hospice support.  Vitals:   06/05/20 0000 06/05/20 0400  BP: 119/70 (!) 123/104  Pulse: 100 92  Resp: 15 15  Temp: 98 F (36.7 C) 98.3 F (36.8 C)  SpO2: 98% 95%    Intake/Output Summary (Last 24 hours) at 06/05/2020 1157 Last data filed at 06/04/2020 2300 Gross per 24 hour  Intake 1858.65 ml  Output 2200 ml  Net -341.35 ml   Last Weight  Most recent update: 06/04/2020  4:54 AM   Weight  81.1 kg (178 lb 12.7 oz)           Gen: Very frail cachectic elderly male HEENT: moist mucous membranes CV: Irregular rate and rhythm PULM: On RA ABD: soft/nontender  EXT: No edema  Neuro: Alert and oriented x2  PPS: 10-20%   This conversation/these recommendations were discussed with patient primary care team, Dr. Waldron Labs  Time In: 1030 Time Out: 1140 Total Time: 70 Greater than 50%  of this time was spent counseling and coordinating care related to the above assessment and plan.  [facility] Palliative Medicine Team Team Cell Phone: [phone] Please utilize secure chat with additional  questions, if there is no response within 30 minutes please call the above phone number  Palliative Medicine Team providers are available by phone from 7am to 7pm daily and can be reached through the team cell phone.  Should this patient require assistance outside of these hours, please call the patient's attending physician.

## 2020-06-05 NOTE — TOC Progression Note (Addendum)
Transition of Care [facility]) - Progression Note    Patient Details  Name: Marvin Morrison MRN: [id number] Date of Birth: 11-18-1940  Transition of Care [facility] CM/SW [place], LCSW Phone Number: 06/05/2020, 11:33 AM  Clinical Narrative:    11:33am-CSW received consult for hospice at [facility] private pay. CSW searching for facilities. So far, [facility] is only bed offer.   3pm-CSW provided patient's daughter with additional two bed offers: Clinical biochemist (private room, $320/day) and Research scientist (physical sciences) (private room, $276/day). CSW will touch base with her Monday and she will work out the financial piece with patient's spouse.    Expected Discharge Plan: [place] Barriers to Discharge: Continued Medical Work up,SNF Pending bed offer  Expected Discharge Plan and Services Expected Discharge Plan: [employer] In-house Referral: Clinical Social Work   Post Acute Care Choice: [place] Living arrangements for the past 2 months: Single Family Home                                       Social Determinants of Health (SDOH) Interventions    Readmission Risk Interventions No flowsheet data found.

## 2020-06-05 NOTE — Progress Notes (Signed)
PROGRESS NOTE    PARNELL SPIELER  EYC:[id number] DOB: 25-Sep-1940 DOA: 06/01/2020 PCP: Cassandria Anger, MD   Brief Narrative:   Marvin Morrison is a 80 y.o. male with medical history significant for hypertension, hyperlipidemia, stage IIIb chronic kidney disease with baseline creatinine 1.5-1.8, urinary retention, chronic anemia with baseline hemoglobin of 12, who is admitted to [facility] on 06/01/2020 with acute kidney injury superimposed on stage IIIb chronic kidney disease after presenting from home to [facility] ED complaining of slurred speech. Patient's wife reports that at 1400 on 05/31/2020 that the patient fell while ambulating at their home without loss of consciousness but exhibiting evidence of new onset slurred speech, without associated evidence of facial droop.  She reports that he also appeared confused relative to his baseline mental status, and she notes that patient was at his baseline level of health leading up to this.   Subjective:  No acute issues or events overnight,he is still complaining of left ankle pain, denies any fever, chills,  Assessment & Plan:   Principal Problem:   AKI (acute kidney injury) ([place]) Active Problems:   Hyperlipidemia   Essential hypertension   Urinary retention   Heme positive stool   Acute encephalopathy   High anion gap metabolic acidosis   Leukocytosis   Acute on chronic anemia   GERD (gastroesophageal reflux disease)   Acute metabolic encephalopathy likely in the setting of AKI/uremia.  AKI on CKD3B  Leukocytosis  Right second toe ulcer  FOBT with anemia   Left ankle pain  Hypokalemia   AGMA -secondary to uremia  Severe protein caloric malnutrition  Profound medication and medical noncompliance  Goals of care -Overall patient is very frail, deconditioned, has been nonambulatory/bedbound for few months now, with very poor compliance, with multiple comorbidities and poor life quality, and significant  deconditioning and functional/ambulatory disorder, palliative medicine were consulted, at this point patient/family want to pursue comfort measures, a MOST form has been completed, continue with comfort measures only, with no escalation of care, a plan will be to discharge to a facility under hospice care, patient was started on Ativan 1 mg every 6 hours around-the-clock given his severe anxiety, as well with trazodone as needed for sleep.   DVT prophylaxis: Comfort  Code Status: DNR, Comfort  Family Communication: Discussed with daughter and wife   Status is: Inpatient  Dispo: The patient is from: Home              Anticipated d/c is to: SNF facility under hospice care              Anticipated d/c date is: When bed is available              Patient currently medically stable for discharge  Consultants:   GI  Podiatry  Procedures:   None  Antimicrobials:  Azithromycin, ceftriaxone    Objective: Vitals:   06/04/20 2011 06/05/20 0000 06/05/20 0400 06/05/20 0723  BP: (!) 151/79 119/70 (!) 123/104 (!) 144/73  Pulse: 100 100 92 (!) 105  Resp: 20 15 15 15   Temp: 97.9 F (36.6 C) 98 F (36.7 C) 98.3 F (36.8 C) 98.2 F (36.8 C)  TempSrc: Axillary Axillary Axillary Oral  SpO2: 99% 98% 95% 97%  Weight:      Height:        Intake/Output Summary (Last 24 hours) at 06/05/2020 1554 Last data filed at 06/05/2020 1511 Gross per 24 hour  Intake 2098.65 ml  Output 4500 ml  Net -2401.35 ml   Filed Weights   06/01/20 1446 06/02/20 0456 06/04/20 0454  Weight: 81.6 kg 80.3 kg 81.1 kg    Examination:   Awake Alert, Oriented X 3, deconditioned, frail, easily distracted  Symmetrical Chest wall movement, Good air movement bilaterally, CTAB RRR,No Gallops,Rubs or new Murmurs, No Parasternal Heave +ve B.Sounds,non tender No Cyanosis, patient with tenderness to palpation in the left ankle area(inconsistent), left foot wrapped, with second toe wound, but no bleeding or  discharge.     Data Reviewed: I have personally reviewed following labs and imaging studies  CBC: Recent Labs  Lab 06/01/20 1455 06/02/20 0051 06/03/20 0046 06/04/20 0132 06/05/20 0418  WBC 23.9* 20.2* 16.2* 13.5* 13.4*  NEUTROABS 20.4* 17.3*  --   --   --   HGB 9.8* 9.7* 8.8* 8.2* 8.1*  HCT 28.7* 27.7* 24.4* 23.5* 23.1*  MCV 102.5* 99.6 98.0 99.2 100.4*  PLT 291 253 253 231 235   Basic Metabolic Panel: Recent Labs  Lab 06/01/20 2049 06/02/20 0051 06/03/20 0046 06/03/20 1723 06/04/20 0132 06/05/20 0418  NA 136 135 141 141 141 142  K 3.0* 2.8* 2.7* 2.7* 2.9* 3.3*  CL 107 105 110 114* 113* 114*  CO2 12* 12* 14* 17* 17* 16*  GLUCOSE 156* 147* 144* 116* 112* 112*  BUN 114* 111* 87* 66* 59* 39*  CREATININE 4.43* 4.41* 3.01* 2.31* 2.01* 1.64*  CALCIUM 8.6* 8.3* 7.7* 7.3* 7.4* 8.0*  MG 2.8* 2.8* 2.3  --   --   --   PHOS  --  6.8*  --   --   --   --    GFR: Estimated Creatinine Clearance: 38.8 mL/min (A) (by C-G formula based on SCr of 1.64 mg/dL (H)). Liver Function Tests: Recent Labs  Lab 06/01/20 1455 06/02/20 0051  AST 14* 15  ALT 15 16  ALKPHOS 51 48  BILITOT 0.7 0.5  PROT 6.3* 5.7*  ALBUMIN 3.0* 2.8*   No results for input(s): LIPASE, AMYLASE in the last 168 hours. Recent Labs  Lab 06/01/20 2049  AMMONIA 26   Coagulation Profile: Recent Labs  Lab 06/01/20 1509 06/02/20 0051  INR 1.4* 1.3*   Cardiac Enzymes: Recent Labs  Lab 06/01/20 2049  CKTOTAL 61   BNP (last 3 results) No results for input(s): PROBNP in the last 8760 hours. HbA1C: No results for input(s): HGBA1C in the last 72 hours. CBG: No results for input(s): GLUCAP in the last 168 hours. Lipid Profile: No results for input(s): CHOL, HDL, LDLCALC, TRIG, CHOLHDL, LDLDIRECT in the last 72 hours. Thyroid Function Tests: No results for input(s): TSH, T4TOTAL, FREET4, T3FREE, THYROIDAB in the last 72 hours. Anemia Panel: No results for input(s): VITAMINB12, FOLATE, FERRITIN, TIBC,  IRON, RETICCTPCT in the last 72 hours. Sepsis Labs: Recent Labs  Lab 06/01/20 2049  PROCALCITON 0.77    Recent Results (from the past 240 hour(s))  Resp Panel by RT-PCR (Flu A&B, Covid) Nasopharyngeal Swab     Status: None   Collection Time: 06/01/20  3:54 PM   Specimen: Nasopharyngeal Swab; Nasopharyngeal(NP) swabs in vial transport medium  Result Value Ref Range Status   SARS Coronavirus 2 by RT PCR NEGATIVE NEGATIVE Final    Comment: (NOTE) SARS-CoV-2 target nucleic acids are NOT DETECTED.  The SARS-CoV-2 RNA is generally detectable in upper respiratory specimens during the acute phase of infection. The lowest concentration of SARS-CoV-2 viral copies this assay can detect is 138 copies/mL. A negative result does not preclude SARS-Cov-2 infection and  should not be used as the sole basis for treatment or other patient management decisions. A negative result may occur with  improper specimen collection/handling, submission of specimen other than nasopharyngeal swab, presence of viral mutation(s) within the areas targeted by this assay, and inadequate number of viral copies(<138 copies/mL). A negative result must be combined with clinical observations, patient history, and epidemiological information. The expected result is Negative.  Fact Sheet for Patients:  EntrepreneurPulse.com.au  Fact Sheet for Healthcare Providers:  IncredibleEmployment.be  This test is no t yet approved or cleared by the [place] FDA and  has been authorized for detection and/or diagnosis of SARS-CoV-2 by FDA under an Emergency Use Authorization (EUA). This EUA will remain  in effect (meaning this test can be used) for the duration of the COVID-19 declaration under Section 564(b)(1) of the Act, 21 U.S.C.section 360bbb-3(b)(1), unless the authorization is terminated  or revoked sooner.       Influenza A by PCR NEGATIVE NEGATIVE Final   Influenza B by PCR  NEGATIVE NEGATIVE Final    Comment: (NOTE) The Xpert Xpress SARS-CoV-2/FLU/RSV plus assay is intended as an aid in the diagnosis of influenza from Nasopharyngeal swab specimens and should not be used as a sole basis for treatment. Nasal washings and aspirates are unacceptable for Xpert Xpress SARS-CoV-2/FLU/RSV testing.  Fact Sheet for Patients: EntrepreneurPulse.com.au  Fact Sheet for Healthcare Providers: IncredibleEmployment.be  This test is not yet approved or cleared by the [place] FDA and has been authorized for detection and/or diagnosis of SARS-CoV-2 by FDA under an Emergency Use Authorization (EUA). This EUA will remain in effect (meaning this test can be used) for the duration of the COVID-19 declaration under Section 564(b)(1) of the Act, 21 U.S.C. section 360bbb-3(b)(1), unless the authorization is terminated or revoked.  Performed at [facility], [place] [address] [place], [place] [address]   Blood culture (routine x 2)     Status: None (Preliminary result)   Collection Time: 06/01/20  5:32 PM   Specimen: BLOOD  Result Value Ref Range Status   Specimen Description BLOOD BLOOD RIGHT FOREARM  Final   Special Requests   Final    BOTTLES DRAWN AEROBIC AND ANAEROBIC Blood Culture adequate volume   Culture   Final    NO GROWTH 4 DAYS Performed at [facility], [place] [address], [place], [place] [address]    Report Status PENDING  Incomplete  Blood culture (routine x 2)     Status: None (Preliminary result)   Collection Time: 06/01/20  6:25 PM   Specimen: BLOOD RIGHT HAND  Result Value Ref Range Status   Specimen Description BLOOD RIGHT HAND  Final   Special Requests   Final    BOTTLES DRAWN AEROBIC AND ANAEROBIC Blood Culture results may not be optimal due to an inadequate volume of blood received in culture bottles   Culture   Final    NO GROWTH 4 DAYS Performed at [facility], [place] [address]  [place], [place] [address]    Report Status PENDING  Incomplete         Radiology Studies: DG Abd Portable 1V  Result Date: 06/04/2020 CLINICAL DATA:  Vomiting. EXAM: PORTABLE ABDOMEN - 1 VIEW COMPARISON:  None. FINDINGS: Gaseous gastric distension. Mild gaseous distension of ascending and transverse colon. No obvious small bowel dilatation. No visualized radiopaque calculi or abnormal soft tissue calcifications. No acute osseous abnormalities are seen. IMPRESSION: Nonspecific gaseous gastric distension. Mild gaseous distension of  ascending and transverse colon. No evidence of small bowel obstruction. Electronically Signed   By: Keith Rake M.D.   On: 06/04/2020 20:05   Scheduled Meds: . ketoconazole   Topical BID  . ketoconazole   Topical Once per day on Mon Thu  . LORazepam  1 mg Intravenous Q6H  . metoprolol succinate  25 mg Oral Daily  . pantoprazole  40 mg Oral Daily  . potassium chloride  40 mEq Oral Q6H  . traZODone  50 mg Oral QHS   Continuous Infusions:    LOS: 4 days    Phillips Climes, MD [facility]  If 7PM-7AM, please contact night-coverage [email or web address]  06/05/2020, 3:54 PM

## 2020-06-05 NOTE — Progress Notes (Signed)
[facility]) Hospital Liaison RN note  Received request from [facility] for hospice services at LTC after discharge. Chart and patient information under review by Hospice physician. Hospice eligibility pending at this time.  Spoke with patient in room and daughter Cristie Hem by phone to initiate education related to hospice philosophy, services, and team approach to care. Patient/family verbalized understanding of information given. Per discussion, the plan is for discharge is still pending a LTC placement.  DME needs discussed. Daughter Cristie Hem does not foresee any DME needs due to LTC placement. Please send signed and completed DNR with patient/family. Please provide symptoms at discharge as needed for ongoing symptom management.   [facility] information and contact numbers given to [employer]. Above information  shared with Cristie Hem, daughter. Please call with any hospice related questions or concerns.   Thank you for the opportunity to participate in this patient's care.  Jhonnie Garner, Therapist, sports, BSN [employer] [phone]

## 2020-06-06 LAB — CULTURE, BLOOD (ROUTINE X 2)
Culture: NO GROWTH
Culture: NO GROWTH
Special Requests: ADEQUATE

## 2020-06-06 LAB — METHYLMALONIC ACID, SERUM: Methylmalonic Acid, Quantitative: 337 nmol/L (ref 0–378)

## 2020-06-06 MED ORDER — GABAPENTIN 100 MG PO CAPS
100.0000 mg | ORAL_CAPSULE | Freq: Three times a day (TID) | ORAL | Status: DC
  Administered 2020-06-06 – 2020-06-08 (×8): 100 mg via ORAL
  Filled 2020-06-06 (×8): qty 1

## 2020-06-06 NOTE — Progress Notes (Signed)
PROGRESS NOTE    Marvin Morrison  YQM:[id number] DOB: 01-03-1941 DOA: 06/01/2020 PCP: Cassandria Anger, MD   Brief Narrative:   Marvin Morrison is a 80 y.o. 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 Patient's wife reports that at 1400 on 05/31/2020 that the patient fell while ambulating at their home without loss of consciousness but exhibiting evidence of new onset slurred speech, without associated evidence of facial droop.  She reports that he also appeared confused relative to his baseline mental status, and she notes that patient was at his baseline level of health leading up to this.   Subjective:  Denies any complaints today  Assessment & Plan:   Principal Problem:   AKI (acute kidney injury) ([place]) Active Problems:   Hyperlipidemia   Essential hypertension   Urinary retention   Heme positive stool   Acute encephalopathy   High anion gap metabolic acidosis   Leukocytosis   Acute on chronic anemia   GERD (gastroesophageal reflux disease)   Acute metabolic encephalopathy likely in the setting of AKI/uremia.  AKI on CKD3B  Leukocytosis  Right second toe ulcer  FOBT with anemia   Left ankle pain  Hypokalemia   AGMA -secondary to uremia  Severe protein caloric malnutrition  Profound medication and medical noncompliance  Goals of care -Overall patient is very frail, deconditioned, has been nonambulatory/bedbound for few months now, with very poor compliance, with multiple comorbidities and poor life quality, and significant deconditioning and functional/ambulatory disorder, palliative medicine were consulted, at  this point patient/family want to pursue comfort measures, a MOST form has been completed, continue with comfort measures only, with no escalation of care, a plan will be to discharge to a facility under hospice care, patient was started on Ativan 1 mg every 6 hours around-the-clock given his severe anxiety, as well with trazodone as needed for sleep.   DVT prophylaxis: Comfort  Code Status: DNR, Comfort  Family Communication:  None at bedside  Status is: Inpatient  Dispo: The patient is from: Home              Anticipated d/c is to: SNF facility under hospice care              Anticipated d/c date is: When bed is available              Patient currently medically stable for discharge  Consultants:   GI  Podiatry  Procedures:   None  Antimicrobials:  Azithromycin, ceftriaxone    Objective: Vitals:   06/05/20 0000 06/05/20 0400 06/05/20 0723 06/05/20 1947  BP: 119/70 (!) 123/104 (!) 144/73 (!) 175/73  Pulse: 100 92 (!) 105 (!) 105  Resp: 15 15 15 18   Temp: 98 F (36.7 C) 98.3 F (36.8 C) 98.2 F (36.8 C) 98.4 F (36.9 C)  TempSrc: Axillary Axillary Oral Oral  SpO2: 98% 95% 97% 97%  Weight:      Height:        Intake/Output Summary (Last 24 hours) at 06/06/2020 1552 Last data filed at 06/06/2020 0955 Gross per 24 hour  Intake --  Output 1800 ml  Net -1800 ml   Filed Weights   06/01/20 1446 06/02/20 0456 06/04/20  0454  Weight: 81.6 kg 80.3 kg 81.1 kg    Examination:  Awake, alert, confused, frail Symmetrical Chest wall movement, Good air movement bilaterally, CTAB RRR,No Gallops,Rubs or new Murmurs, No Parasternal Heave +ve B.Sounds, Abd Soft, No tenderness, No rebound - guarding or rigidity. No Cyanosis,      Data Reviewed: I have personally reviewed following labs and imaging studies  CBC: Recent Labs  Lab 06/01/20 1455 06/02/20 0051 06/03/20 0046 06/04/20 0132 06/05/20 0418  WBC 23.9* 20.2* 16.2* 13.5* 13.4*  NEUTROABS 20.4* 17.3*  --    --   --   HGB 9.8* 9.7* 8.8* 8.2* 8.1*  HCT 28.7* 27.7* 24.4* 23.5* 23.1*  MCV 102.5* 99.6 98.0 99.2 100.4*  PLT 291 253 253 231 124   Basic Metabolic Panel: Recent Labs  Lab 06/01/20 2049 06/02/20 0051 06/03/20 0046 06/03/20 1723 06/04/20 0132 06/05/20 0418  NA 136 135 141 141 141 142  K 3.0* 2.8* 2.7* 2.7* 2.9* 3.3*  CL 107 105 110 114* 113* 114*  CO2 12* 12* 14* 17* 17* 16*  GLUCOSE 156* 147* 144* 116* 112* 112*  BUN 114* 111* 87* 66* 59* 39*  CREATININE 4.43* 4.41* 3.01* 2.31* 2.01* 1.64*  CALCIUM 8.6* 8.3* 7.7* 7.3* 7.4* 8.0*  MG 2.8* 2.8* 2.3  --   --   --   PHOS  --  6.8*  --   --   --   --    GFR: Estimated Creatinine Clearance: 38.8 mL/min (A) (by C-G formula based on SCr of 1.64 mg/dL (H)). Liver Function Tests: Recent Labs  Lab 06/01/20 1455 06/02/20 0051  AST 14* 15  ALT 15 16  ALKPHOS 51 48  BILITOT 0.7 0.5  PROT 6.3* 5.7*  ALBUMIN 3.0* 2.8*   No results for input(s): LIPASE, AMYLASE in the last 168 hours. Recent Labs  Lab 06/01/20 2049  AMMONIA 26   Coagulation Profile: Recent Labs  Lab 06/01/20 1509 06/02/20 0051  INR 1.4* 1.3*   Cardiac Enzymes: Recent Labs  Lab 06/01/20 2049  CKTOTAL 61   BNP (last 3 results) No results for input(s): PROBNP in the last 8760 hours. HbA1C: No results for input(s): HGBA1C in the last 72 hours. CBG: No results for input(s): GLUCAP in the last 168 hours. Lipid Profile: No results for input(s): CHOL, HDL, LDLCALC, TRIG, CHOLHDL, LDLDIRECT in the last 72 hours. Thyroid Function Tests: No results for input(s): TSH, T4TOTAL, FREET4, T3FREE, THYROIDAB in the last 72 hours. Anemia Panel: No results for input(s): VITAMINB12, FOLATE, FERRITIN, TIBC, IRON, RETICCTPCT in the last 72 hours. Sepsis Labs: Recent Labs  Lab 06/01/20 2049  PROCALCITON 0.77    Recent Results (from the past 240 hour(s))  Resp Panel by RT-PCR (Flu A&B, Covid) Nasopharyngeal Swab     Status: None   Collection Time: 06/01/20   3:54 PM   Specimen: Nasopharyngeal Swab; Nasopharyngeal(NP) swabs in vial transport medium  Result Value Ref Range Status   SARS Coronavirus 2 by RT PCR NEGATIVE NEGATIVE Final    Comment: (NOTE) SARS-CoV-2 target nucleic acids are NOT DETECTED.  The SARS-CoV-2 RNA is generally detectable in upper respiratory specimens during the acute phase of infection. The lowest concentration of SARS-CoV-2 viral copies this assay can detect is 138 copies/mL. A negative result does not preclude SARS-Cov-2 infection and should not be used as the sole basis for treatment or other patient management decisions. A negative result may occur with  improper specimen collection/handling, submission of specimen other than nasopharyngeal swab, presence  of viral mutation(s) within the areas targeted by this assay, and inadequate number of viral copies(<138 copies/mL). A negative result must be combined with clinical observations, patient history, and epidemiological information. The expected result is Negative.  Fact Sheet for Patients:  EntrepreneurPulse.com.au  Fact Sheet for Healthcare Providers:  IncredibleEmployment.be  This test is no t yet approved or cleared by the [place] FDA and  has been authorized for detection and/or diagnosis of SARS-CoV-2 by FDA under an Emergency Use Authorization (EUA). This EUA will remain  in effect (meaning this test can be used) for the duration of the COVID-19 declaration under Section 564(b)(1) of the Act, 21 U.S.C.section 360bbb-3(b)(1), unless the authorization is terminated  or revoked sooner.       Influenza A by PCR NEGATIVE NEGATIVE Final   Influenza B by PCR NEGATIVE NEGATIVE Final    Comment: (NOTE) The Xpert Xpress SARS-CoV-2/FLU/RSV plus assay is intended as an aid in the diagnosis of influenza from Nasopharyngeal swab specimens and should not be used as a sole basis for treatment. Nasal washings and aspirates  are unacceptable for Xpert Xpress SARS-CoV-2/FLU/RSV testing.  Fact Sheet for Patients: EntrepreneurPulse.com.au  Fact Sheet for Healthcare Providers: IncredibleEmployment.be  This test is not yet approved or cleared by the [place] FDA and has been authorized for detection and/or diagnosis of SARS-CoV-2 by FDA under an Emergency Use Authorization (EUA). This EUA will remain in effect (meaning this test can be used) for the duration of the COVID-19 declaration under Section 564(b)(1) of the Act, 21 U.S.C. section 360bbb-3(b)(1), unless the authorization is terminated or revoked.  Performed at [facility], [place] [address] [place], [place] [address]   Blood culture (routine x 2)     Status: None   Collection Time: 06/01/20  5:32 PM   Specimen: BLOOD  Result Value Ref Range Status   Specimen Description BLOOD BLOOD RIGHT FOREARM  Final   Special Requests   Final    BOTTLES DRAWN AEROBIC AND ANAEROBIC Blood Culture adequate volume   Culture   Final    NO GROWTH 5 DAYS Performed at [facility], [place] [address] [place], [place] [address]    Report Status 06/06/2020 FINAL  Final  Blood culture (routine x 2)     Status: None   Collection Time: 06/01/20  6:25 PM   Specimen: BLOOD RIGHT HAND  Result Value Ref Range Status   Specimen Description BLOOD RIGHT HAND  Final   Special Requests   Final    BOTTLES DRAWN AEROBIC AND ANAEROBIC Blood Culture results may not be optimal due to an inadequate volume of blood received in culture bottles   Culture   Final    NO GROWTH 5 DAYS Performed at [facility], [place] [address], [place], [place] [address]    Report Status 06/06/2020 FINAL  Final         Radiology Studies: DG Abd Portable 1V  Result Date: 06/04/2020 CLINICAL DATA:  Vomiting. EXAM: PORTABLE ABDOMEN - 1 VIEW COMPARISON:  None. FINDINGS: Gaseous gastric distension. Mild gaseous distension of ascending and transverse  colon. No obvious small bowel dilatation. No visualized radiopaque calculi or abnormal soft tissue calcifications. No acute osseous abnormalities are seen. IMPRESSION: Nonspecific gaseous gastric distension. Mild gaseous distension of ascending and transverse colon. No evidence of small bowel obstruction. Electronically Signed   By: Keith Rake M.D.   On: 06/04/2020 20:05   Scheduled Meds: . gabapentin  100 mg Oral TID  .  ketoconazole   Topical BID  . ketoconazole   Topical Once per day on Mon Thu  . LORazepam  1 mg Intravenous Q6H  . metoprolol succinate  25 mg Oral Daily  . pantoprazole  40 mg Oral Daily  . traZODone  50 mg Oral QHS   Continuous Infusions:    LOS: 5 days    Phillips Climes, MD [facility]  If 7PM-7AM, please contact night-coverage [email or web address]  06/06/2020, 3:52 PM

## 2020-06-06 NOTE — Plan of Care (Signed)

## 2020-06-06 NOTE — Progress Notes (Signed)
Have been attempting to clean up patient and give him a bath throughout the day, but he has been refusing. Will continue to offer, and I have also notified his wife who is now at bedside.

## 2020-06-07 MED ORDER — OXYCODONE-ACETAMINOPHEN 5-325 MG PO TABS
1.0000 | ORAL_TABLET | Freq: Once | ORAL | Status: AC
  Administered 2020-06-07: 1 via ORAL
  Filled 2020-06-07: qty 1

## 2020-06-07 NOTE — Plan of Care (Signed)

## 2020-06-07 NOTE — Progress Notes (Signed)
PROGRESS NOTE    Marvin Morrison  ZOX:[id number] DOB: [birth year] DOA: 06/01/2020 PCP: Cassandria Anger, MD   Brief Narrative:   Marvin Morrison is a 80 y.o. male with medical history significant for hypertension, hyperlipidemia, stage IIIb chronic kidney disease with baseline creatinine 1.5-1.8, urinary retention, chronic anemia with baseline hemoglobin of 12, who is admitted to [facility] on 06/01/2020 with acute kidney injury superimposed on stage IIIb chronic kidney disease after presenting from home to [facility] ED complaining of slurred speech. Patient's wife reports that at 1400 on 05/31/2020 that the patient fell while ambulating at their home without loss of consciousness but exhibiting evidence of new onset slurred speech, without associated evidence of facial droop.  She reports that he also appeared confused relative to his baseline mental status, and she notes that patient was at his baseline level of health leading up to this.   Subjective:  Denies any complaints today  Assessment & Plan:   Principal Problem:   AKI (acute kidney injury) ([place]) Active Problems:   Hyperlipidemia   Essential hypertension   Urinary retention   Heme positive stool   Acute encephalopathy   High anion gap metabolic acidosis   Leukocytosis   Acute on chronic anemia   GERD (gastroesophageal reflux disease)   Acute metabolic encephalopathy likely in the setting of AKI/uremia.  AKI on CKD3B  Leukocytosis  Right second toe ulcer  FOBT with anemia   Left ankle pain  Hypokalemia   AGMA -secondary to uremia  Severe protein caloric malnutrition  Profound medication and medical noncompliance  Goals of care -Overall patient is very frail, deconditioned, has been nonambulatory/bedbound for few months now, with very poor compliance, with multiple comorbidities and poor life quality, and significant deconditioning and functional/ambulatory disorder, palliative medicine were consulted, at  this point patient/family want to pursue comfort measures, a MOST form has been completed, continue with comfort measures only, with no escalation of care, a plan will be to discharge to a facility under hospice care, patient was started on Ativan 1 mg every 6 hours around-the-clock given his severe anxiety, as well with trazodone as needed for sleep.   DVT prophylaxis: Comfort  Code Status: DNR, Comfort  Family Communication:  None at bedside  Status is: Inpatient  Dispo: The patient is from: Home              Anticipated d/c is to: SNF facility under hospice care              Anticipated d/c date is: When bed is available              Patient currently medically stable for discharge  Consultants:   GI  Podiatry  Procedures:   None  Antimicrobials:  Azithromycin, ceftriaxone    Objective: Vitals:   06/05/20 0723 06/05/20 1947 06/06/20 1700 06/07/20 0647  BP: (!) 144/73 (!) 175/73 126/81 136/72  Pulse: (!) 105 (!) 105 94 92  Resp: 15 18 17 18   Temp: 98.2 F (36.8 C) 98.4 F (36.9 C) 98.2 F (36.8 C) 98 F (36.7 C)  TempSrc: Oral Oral Axillary Axillary  SpO2: 97% 97% 93% 91%  Weight:      Height:       No intake or output data in the 24 hours ending 06/07/20 1456 Filed Weights   06/01/20 1446 06/02/20 0456 06/04/20 0454  Weight: 81.6 kg 80.3 kg 81.1 kg    Examination:  Awake, confused, frail Good air entry bilaterally  Regular rate and rhythm Abdomen soft No Cyanosis      Data Reviewed: I have personally reviewed following labs and imaging studies  CBC: Recent Labs  Lab 06/01/20 1455 06/02/20 0051 06/03/20 0046 06/04/20 0132 06/05/20 0418  WBC 23.9* 20.2* 16.2* 13.5* 13.4*  NEUTROABS 20.4* 17.3*  --   --   --   HGB 9.8* 9.7* 8.8* 8.2* 8.1*  HCT 28.7* 27.7* 24.4* 23.5* 23.1*  MCV 102.5* 99.6 98.0 99.2 100.4*  PLT 291 253 253 231 629   Basic Metabolic Panel: Recent Labs  Lab 06/01/20 2049 06/02/20 0051 06/03/20 0046 06/03/20 1723  06/04/20 0132 06/05/20 0418  NA 136 135 141 141 141 142  K 3.0* 2.8* 2.7* 2.7* 2.9* 3.3*  CL 107 105 110 114* 113* 114*  CO2 12* 12* 14* 17* 17* 16*  GLUCOSE 156* 147* 144* 116* 112* 112*  BUN 114* 111* 87* 66* 59* 39*  CREATININE 4.43* 4.41* 3.01* 2.31* 2.01* 1.64*  CALCIUM 8.6* 8.3* 7.7* 7.3* 7.4* 8.0*  MG 2.8* 2.8* 2.3  --   --   --   PHOS  --  6.8*  --   --   --   --    GFR: Estimated Creatinine Clearance: 38.8 mL/min (A) (by C-G formula based on SCr of 1.64 mg/dL (H)). Liver Function Tests: Recent Labs  Lab 06/01/20 1455 06/02/20 0051  AST 14* 15  ALT 15 16  ALKPHOS 51 48  BILITOT 0.7 0.5  PROT 6.3* 5.7*  ALBUMIN 3.0* 2.8*   No results for input(s): LIPASE, AMYLASE in the last 168 hours. Recent Labs  Lab 06/01/20 2049  AMMONIA 26   Coagulation Profile: Recent Labs  Lab 06/01/20 1509 06/02/20 0051  INR 1.4* 1.3*   Cardiac Enzymes: Recent Labs  Lab 06/01/20 2049  CKTOTAL 61   BNP (last 3 results) No results for input(s): PROBNP in the last 8760 hours. HbA1C: No results for input(s): HGBA1C in the last 72 hours. CBG: No results for input(s): GLUCAP in the last 168 hours. Lipid Profile: No results for input(s): CHOL, HDL, LDLCALC, TRIG, CHOLHDL, LDLDIRECT in the last 72 hours. Thyroid Function Tests: No results for input(s): TSH, T4TOTAL, FREET4, T3FREE, THYROIDAB in the last 72 hours. Anemia Panel: No results for input(s): VITAMINB12, FOLATE, FERRITIN, TIBC, IRON, RETICCTPCT in the last 72 hours. Sepsis Labs: Recent Labs  Lab 06/01/20 2049  PROCALCITON 0.77    Recent Results (from the past 240 hour(s))  Resp Panel by RT-PCR (Flu A&B, Covid) Nasopharyngeal Swab     Status: None   Collection Time: 06/01/20  3:54 PM   Specimen: Nasopharyngeal Swab; Nasopharyngeal(NP) swabs in vial transport medium  Result Value Ref Range Status   SARS Coronavirus 2 by RT PCR NEGATIVE NEGATIVE Final    Comment: (NOTE) SARS-CoV-2 target nucleic acids are NOT  DETECTED.  The SARS-CoV-2 RNA is generally detectable in upper respiratory specimens during the acute phase of infection. The lowest concentration of SARS-CoV-2 viral copies this assay can detect is 138 copies/mL. A negative result does not preclude SARS-Cov-2 infection and should not be used as the sole basis for treatment or other patient management decisions. A negative result may occur with  improper specimen collection/handling, submission of specimen other than nasopharyngeal swab, presence of viral mutation(s) within the areas targeted by this assay, and inadequate number of viral copies(<138 copies/mL). A negative result must be combined with clinical observations, patient history, and epidemiological information. The expected result is Negative.  Fact Sheet for Patients:  EntrepreneurPulse.com.au  Fact Sheet for Healthcare Providers:  IncredibleEmployment.be  This test is no t yet approved or cleared by the [place] FDA and  has been authorized for detection and/or diagnosis of SARS-CoV-2 by FDA under an Emergency Use Authorization (EUA). This EUA will remain  in effect (meaning this test can be used) for the duration of the COVID-19 declaration under Section 564(b)(1) of the Act, 21 U.S.C.section 360bbb-3(b)(1), unless the authorization is terminated  or revoked sooner.       Influenza A by PCR NEGATIVE NEGATIVE Final   Influenza B by PCR NEGATIVE NEGATIVE Final    Comment: (NOTE) The Xpert Xpress SARS-CoV-2/FLU/RSV plus assay is intended as an aid in the diagnosis of influenza from Nasopharyngeal swab specimens and should not be used as a sole basis for treatment. Nasal washings and aspirates are unacceptable for Xpert Xpress SARS-CoV-2/FLU/RSV testing.  Fact Sheet for Patients: EntrepreneurPulse.com.au  Fact Sheet for Healthcare Providers: IncredibleEmployment.be  This test is not yet  approved or cleared by the [place] FDA and has been authorized for detection and/or diagnosis of SARS-CoV-2 by FDA under an Emergency Use Authorization (EUA). This EUA will remain in effect (meaning this test can be used) for the duration of the COVID-19 declaration under Section 564(b)(1) of the Act, 21 U.S.C. section 360bbb-3(b)(1), unless the authorization is terminated or revoked.  Performed at [facility], [place] [address] [place], [place] [address]   Blood culture (routine x 2)     Status: None   Collection Time: 06/01/20  5:32 PM   Specimen: BLOOD  Result Value Ref Range Status   Specimen Description BLOOD BLOOD RIGHT FOREARM  Final   Special Requests   Final    BOTTLES DRAWN AEROBIC AND ANAEROBIC Blood Culture adequate volume   Culture   Final    NO GROWTH 5 DAYS Performed at [facility], [place] [address], [place], [place] [address]    Report Status 06/06/2020 FINAL  Final  Blood culture (routine x 2)     Status: None   Collection Time: 06/01/20  6:25 PM   Specimen: BLOOD RIGHT HAND  Result Value Ref Range Status   Specimen Description BLOOD RIGHT HAND  Final   Special Requests   Final    BOTTLES DRAWN AEROBIC AND ANAEROBIC Blood Culture results may not be optimal due to an inadequate volume of blood received in culture bottles   Culture   Final    NO GROWTH 5 DAYS Performed at [facility], [place] [address] [place], [place] [address]    Report Status 06/06/2020 FINAL  Final         Radiology Studies: No results found. Scheduled Meds: . gabapentin  100 mg Oral TID  . ketoconazole   Topical BID  . ketoconazole   Topical Once per day on Mon Thu  . LORazepam  1 mg Intravenous Q6H  . metoprolol succinate  25 mg Oral Daily  . pantoprazole  40 mg Oral Daily  . traZODone  50 mg Oral QHS   Continuous Infusions:    LOS: 6 days    Phillips Climes, MD [facility]  If 7PM-7AM, please contact  night-coverage [email or web address]  06/07/2020, 2:56 PM

## 2020-06-07 NOTE — Progress Notes (Signed)
[facility]) Hospital Liaison RN note  Received request from [facility] for hospice services at LTC after discharge. Hospice eligibility confirmed.  [facility] will continue to follow this patient to coordinate hospice services once he is discharge to LTC facility.   Please send signed and completed DNR form home with patient/family. Patient will need prescriptions for discharge comfort medications.    [facility] aware of the above. Please notify [facility] when patient is ready to leave the unit at discharge. (Call [phone] or [phone] after 5pm.)  Please do not hesitate to call with questions.    Thank you,   [place]rel Gordon, RN, [place] (listed on [facility] under [place])    [phone]

## 2020-06-08 DIAGNOSIS — Z515 Encounter for palliative care: Secondary | ICD-10-CM

## 2020-06-08 LAB — RESP PANEL BY RT-PCR (FLU A&B, COVID) ARPGX2
Influenza A by PCR: NEGATIVE
Influenza B by PCR: NEGATIVE
SARS Coronavirus 2 by RT PCR: NEGATIVE

## 2020-06-08 MED ORDER — METOPROLOL SUCCINATE ER 25 MG PO TB24: 25.0000 mg | ORAL_TABLET | Freq: Every day | ORAL | Status: AC

## 2020-06-08 MED ORDER — LORAZEPAM 1 MG PO TABS
1.0000 mg | ORAL_TABLET | Freq: Four times a day (QID) | ORAL | Status: DC
  Administered 2020-06-08: 1 mg via ORAL
  Filled 2020-06-08: qty 1

## 2020-06-08 MED ORDER — LORAZEPAM 1 MG PO TABS: 1.0000 mg | ORAL_TABLET | Freq: Four times a day (QID) | ORAL | 0 refills | Status: AC | PRN

## 2020-06-08 MED ORDER — TRAZODONE HCL 50 MG PO TABS: 50.0000 mg | ORAL_TABLET | Freq: Every day | ORAL | Status: AC

## 2020-06-08 MED ORDER — OXYCODONE HCL ER 10 MG PO T12A: 10.0000 mg | EXTENDED_RELEASE_TABLET | Freq: Two times a day (BID) | ORAL | 0 refills | Status: AC

## 2020-06-08 MED ORDER — ONDANSETRON HCL 4 MG PO TABS: 4.0000 mg | ORAL_TABLET | Freq: Four times a day (QID) | ORAL | 0 refills | Status: AC | PRN

## 2020-06-08 MED ORDER — OXYCODONE HCL ER 10 MG PO T12A
10.0000 mg | EXTENDED_RELEASE_TABLET | Freq: Two times a day (BID) | ORAL | Status: DC
  Administered 2020-06-08: 10 mg via ORAL
  Filled 2020-06-08: qty 1

## 2020-06-08 MED ORDER — OXYCODONE-ACETAMINOPHEN 5-325 MG PO TABS: 1.0000 | ORAL_TABLET | ORAL | 0 refills | Status: AC | PRN

## 2020-06-08 MED ORDER — ONDANSETRON HCL 4 MG PO TABS
4.0000 mg | ORAL_TABLET | Freq: Four times a day (QID) | ORAL | Status: DC | PRN
  Administered 2020-06-08: 4 mg via ORAL
  Filled 2020-06-08: qty 1

## 2020-06-08 MED ORDER — LORAZEPAM 1 MG PO TABS: 1.0000 mg | ORAL_TABLET | Freq: Four times a day (QID) | ORAL | 0 refills | Status: AC

## 2020-06-08 NOTE — Progress Notes (Signed)
Nsg Discharge Note  Admit Date:  06/01/2020 Discharge date: 06/08/2020   Gretchen Short to be D/C'd Skilled nursing facility per MD order.  AVS completed.    Discharge Medication: Allergies as of 06/08/2020   No Known Allergies     Medication List    STOP taking these medications   acyclovir 400 MG tablet Commonly known as: ZOVIRAX   furosemide 40 MG tablet Commonly known as: LASIX   multivitamin with minerals Tabs tablet     TAKE these medications   gabapentin 100 MG capsule Commonly known as: NEURONTIN TAKE 1 CAPSULE BY MOUTH THREE TIMES A DAY What changed:   how much to take  how to take this  when to take this  additional instructions   LORazepam 1 MG tablet Commonly known as: ATIVAN Take 1 tablet (1 mg total) by mouth every 6 (six) hours.   LORazepam 1 MG tablet Commonly known as: Ativan Take 1 tablet (1 mg total) by mouth every 6 (six) hours as needed for up to 5 days for anxiety.   metoprolol succinate 25 MG 24 hr tablet Commonly known as: TOPROL-XL Take 1 tablet (25 mg total) by mouth daily. Start taking on: June 09, 2020 What changed:   medication strength  how much to take  how to take this  when to take this  additional instructions   ondansetron 4 MG tablet Commonly known as: ZOFRAN Take 1 tablet (4 mg total) by mouth every 6 (six) hours as needed for nausea or vomiting. What changed: See the new instructions.   oxyCODONE 10 mg 12 hr tablet Commonly known as: OXYCONTIN Take 1 tablet (10 mg total) by mouth every 12 (twelve) hours.   oxyCODONE-acetaminophen 5-325 MG tablet Commonly known as: PERCOCET/ROXICET Take 1 tablet by mouth every 4 (four) hours as needed for severe pain.   pantoprazole 40 MG tablet Commonly known as: PROTONIX Take 1 tablet (40 mg total) by mouth daily.   tamsulosin 0.4 MG Caps capsule Commonly known as: FLOMAX TAKE 1 CAPSULE BY MOUTH EVERY DAY   traZODone 50 MG tablet Commonly known as: DESYREL Take 1  tablet (50 mg total) by mouth at bedtime.            Discharge Care Instructions  (From admission, onward)         Start     Ordered   06/08/20 0000  Discharge wound care:       Comments: Clean the right foot including in between the toes with soap and water, rinse and dry. Apply Xeroform gauze in between the toes by weaving in and out and then a small Kerlex. Wrap the foot with Xeroform gauze and secure with Kerlex. Change daily.  Apply Prevalon heel lift boots to both feet.  Clean the sacral area with no rinse cleanser, dry and apply triple paste. Apply daily or PRN soiling.   06/08/20 1104          Discharge Assessment: Vitals:   06/07/20 1820 06/07/20 2313  BP: (!) 150/80 129/69  Pulse: 100 99  Resp: 18 18  Temp: 98.2 F (36.8 C) 98.1 F (36.7 C)  SpO2: 95% 95%   Skin clean, dry and intact without evidence of skin break down, no evidence of skin tears noted. IV catheter discontinued intact. Site without signs and symptoms of complications - no redness or edema noted at insertion site, patient denies c/o pain - only slight tenderness at site.  Dressing with slight pressure applied.  D/c  Instructions-Education: Discharge instructions given to receiving facility & patient/family who verbalized understanding. D/c education completed including follow up instructions, medication list, d/c activities limitations if indicated, with other d/c instructions as indicated by MD -, all questions fully answered. Patient instructed to return to ED, call 911, or call MD for any changes in condition.  Patient escorted via stretcher and [place] ambulance services.   Hiram Comber, RN 06/08/2020 5:43 PM

## 2020-06-08 NOTE — Progress Notes (Addendum)
   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  Palliative care asked to get involved in the setting of multiple comorbid conditions and concerns the patient is at the end phases of his disease process.  Goals of care conversations were held and comfort care modality of care was selected on 6/3.  Have been awaiting custodial hospice placement since then.  Today's Discussion (06/08/2020):  *Please note that this is a verbal dictation therefore any spelling or grammatical errors are due to the "[employer]" system interpretation.  Chart reviewed.   I met with Marvin Morrison at bedside this morning his daughter, Marvin Morrison was present as was his wife, Marvin Morrison.  We reviewed the plan for [name] to transition to [facility] today.  [name] reviewed with me his questions inclusive of the daily cost for room and board.  I shared with him that I could not comment on this though there is a Education officer, museum at the facility who can answer these questions more comprehensively.  Being that his expectancy is greater than 2 weeks there will be an out-of-pocket expense which she and his family are able to accommodate.  At this time symptoms appear to be under good control.  Questions and concerns addressed   Objective Assessment: Vital Signs Vitals:   06/07/20 1820 06/07/20 2313  BP: (!) 150/80 129/69  Pulse: 100 99  Resp: 18 18  Temp: 98.2 F (36.8 C) 98.1 F (36.7 C)  SpO2: 95% 95%    Intake/Output  Summary (Last 24 hours) at 06/08/2020 1102 Last data filed at 06/08/2020 4098 Gross per 24 hour  Intake 240 ml  Output 4100 ml  Net -3860 ml   Last Weight  Most recent update: 06/04/2020  4:54 AM   Weight  81.1 kg (178 lb 12.7 oz)           Gen: Very frail cachectic elderly male HEENT: moist mucous membranes CV: Irregular rate and rhythm PULM: On RA ABD: soft/nontender  EXT: R ankle wrapped Neuro: Alert and oriented x2  SUMMARY OF RECOMMENDATIONS DNAR/DNI  MOST Completed, paper copy placed onto the chart electric copy can be found in [place]  DNR Form Completed, paper copy placed onto the chart electric copy can be found in [place] focused care  Comfort medications per [facility]   Appreciate spiritual care arranging for Catholic priest to come and deliver last rites  Unrestricted visitation  Plan for transfer to [facility] this afternoon  Time Spent: 25 Greater than 50% of the time was spent in counseling and coordination of care ______________________________________________________________________________________ [place] Team Team Cell Phone: [phone] Please utilize secure chat with additional questions, if there is no response within 30 minutes please call the above phone number  Palliative Medicine Team providers are available by phone from 7am to 7pm daily and can be reached through the team cell phone.  Should this patient require assistance outside of these hours, please call the patient's attending physician.

## 2020-06-08 NOTE — TOC Transition Note (Signed)
Transition of Care [facility]) - CM/SW Discharge Note   Patient Details  Name: Marvin Morrison MRN: [id number] Date of Birth: Dec 01, 1940  Transition of Care [facility]) CM/SW Contact:  Benard Halsted, LCSW Phone Number: 06/08/2020, 2:38 PM   Clinical Narrative:    Patient will DC to: [facility] Anticipated DC date: 06/08/20 Family notified: Daughter, Cristie Hem, spouse Transport by: Corey Harold    Per MD patient ready for DC to [place]. RN to call report prior to discharge [phone]). RN, patient, patient's family, hospice, and facility notified of DC. Discharge Summary and FL2 sent to facility. DC packet on chart. Ambulance transport requested for patient.   CSW will sign off for now as social work intervention is no longer needed. Please consult [place] again if new needs arise.      Final next level of care: Skilled Nursing Facility Barriers to Discharge: Barriers Resolved   Patient Goals and CMS Choice Patient states their goals for this hospitalization and ongoing recovery are:: comfort CMS Medicare.gov Compare Post Acute Care list provided to:: Patient Represenative (must comment) Choice offered to / list presented to : Adult Children  Discharge Placement   Existing PASRR number confirmed : 06/08/20          Patient chooses bed at: [facility] Patient to be transferred to facility by: [place] Name of family member notified: Cristie Hem, daughter Patient and family notified of of transfer: 06/08/20  Discharge Plan and Services In-house Referral: Clinical Social Work   Post Acute Care Choice: [place]                      [facility] Agency: [facility] [place] Date [place]: 06/08/20 Time [place]: [place] Representative spoke with at [place]: Judeen Hammans  Social Determinants of Health ([place]) Interventions     Readmission Risk Interventions No flowsheet data found.

## 2020-06-08 NOTE — Discharge Instructions (Signed)
Management per Hospice

## 2020-06-08 NOTE — Progress Notes (Signed)
[place] [id number]) Hospital Liaison RN note  Update on this referral:  TOC confirmed discharge to [facility] this afternoon - awaiting PTAR at this time.  [place] referral center aware of patient disposition and will follow upon discharge.  Please send signed and completed DNR form home with patient/family. Patient will need prescriptions for discharge comfort medications.    Thank you for the opportunity to be involved in this patient's care,.    Thank you,    Gar Ponto, RN     [facility] Liaison (listed on [facility] under [place])    [phone]

## 2020-06-08 NOTE — Discharge Summary (Signed)
Physician Discharge Summary  Marvin Morrison OBS:[id number] DOB: 1940/10/09 DOA: 06/01/2020  PCP: Cassandria Anger, MD  Admit date: 06/01/2020 Discharge date: 06/08/2020  Admitted From: Home Disposition:  [facility]  Recommendations for Outpatient Follow-up:  -Patient is being discharged under hospice care, main goal is comfort .  Home Health:NO Equipment/Devices:Hospice  Discharge Condition:hospice CODE STATUS:DNR/ Comfort Care Diet recommendation: Regular  Brief/Interim Summary:   - Acute metabolic encephalopathy likely in the setting of AKI/uremia. - AKI on CKD3B - Leukocytosis - Right second toe ulcer - FOBT with anemia  - Left ankle pain - Hypokalemia  - AGMA -secondary to uremia - Severe protein caloric malnutrition - Profound medication and medical noncompliance  Goals of care -Overall patient is very frail, deconditioned, has been nonambulatory/bedbound for few months now, with very poor compliance, with multiple comorbidities and poor life quality, and significant deconditioning and functional/ambulatory disorder, palliative medicine were consulted, at this point patient/family want to pursue comfort measures, a MOST form has been completed, continue with comfort measures only, with no escalation of care, a plan will be to discharge to a facility under hospice care, patient was started on Ativan 1 mg every 6 hours around-the-clock given his severe anxiety, as well with trazodone as needed for sleep.  We will patient with chronic significant left ankle pain, so he was started on OxyContin, and he will be discharged on as needed Ativan and as needed Percocet as well for further anxiety and pain control as needed, discussed multiple times with family, they were updated at bedside today as well.   Discharge Diagnoses:  Principal Problem:   AKI (acute kidney injury) ([place]) Active Problems:   Hyperlipidemia   Essential hypertension   Urinary retention   Heme positive  stool   Acute encephalopathy   High anion gap metabolic acidosis   Leukocytosis   Acute on chronic anemia   GERD (gastroesophageal reflux disease)   Hospice care patient   Palliative care patient    Discharge Instructions  Discharge Instructions    Discharge instructions   Complete by: As directed    Management per hospice at facility   Discharge wound care:   Complete by: As directed    Clean the right foot including in between the toes with soap and water, rinse and dry. Apply Xeroform gauze in between the toes by weaving in and out and then a small Kerlex. Wrap the foot with Xeroform gauze and secure with Kerlex. Change daily.  Apply Prevalon heel lift boots to both feet.  Clean the sacral area with no rinse cleanser, dry and apply triple paste. Apply daily or PRN soiling.   Increase activity slowly   Complete by: As directed    No wound care   Complete by: As directed      Allergies as of 06/08/2020   No Known Allergies     Medication List    STOP taking these medications   acyclovir 400 MG tablet Commonly known as: ZOVIRAX   furosemide 40 MG tablet Commonly known as: LASIX   multivitamin with minerals Tabs tablet     TAKE these medications   gabapentin 100 MG capsule Commonly known as: NEURONTIN TAKE 1 CAPSULE BY MOUTH THREE TIMES A DAY What changed:   how much to take  how to take this  when to take this  additional instructions   LORazepam 1 MG tablet Commonly known as: ATIVAN Take 1 tablet (1 mg total) by mouth every 6 (six) hours.  LORazepam 1 MG tablet Commonly known as: Ativan Take 1 tablet (1 mg total) by mouth every 6 (six) hours as needed for up to 5 days for anxiety.   metoprolol succinate 25 MG 24 hr tablet Commonly known as: TOPROL-XL Take 1 tablet (25 mg total) by mouth daily. Start taking on: June 09, 2020 What changed:   medication strength  how much to take  how to take this  when to take this  additional  instructions   ondansetron 4 MG tablet Commonly known as: ZOFRAN Take 1 tablet (4 mg total) by mouth every 6 (six) hours as needed for nausea or vomiting. What changed: See the new instructions.   oxyCODONE 10 mg 12 hr tablet Commonly known as: OXYCONTIN Take 1 tablet (10 mg total) by mouth every 12 (twelve) hours.   oxyCODONE-acetaminophen 5-325 MG tablet Commonly known as: PERCOCET/ROXICET Take 1 tablet by mouth every 4 (four) hours as needed for severe pain.   pantoprazole 40 MG tablet Commonly known as: PROTONIX Take 1 tablet (40 mg total) by mouth daily.   tamsulosin 0.4 MG Caps capsule Commonly known as: FLOMAX TAKE 1 CAPSULE BY MOUTH EVERY DAY   traZODone 50 MG tablet Commonly known as: DESYREL Take 1 tablet (50 mg total) by mouth at bedtime.            Discharge Care Instructions  (From admission, onward)         Start     Ordered   06/08/20 0000  Discharge wound care:       Comments: Clean the right foot including in between the toes with soap and water, rinse and dry. Apply Xeroform gauze in between the toes by weaving in and out and then a small Kerlex. Wrap the foot with Xeroform gauze and secure with Kerlex. Change daily.  Apply Prevalon heel lift boots to both feet.  Clean the sacral area with no rinse cleanser, dry and apply triple paste. Apply daily or PRN soiling.   06/08/20 1104          Contact information for after-discharge care    Destination    [facility] Preferred SNF .   Service: Skilled Nursing Contact information: [address] [place] [phone]                 No Known Allergies  Consultations: Palliative medicine History enterology  Procedures/Studies: DG Knee 1-2 Views Left  Result Date: 06/03/2020 CLINICAL DATA:  80 year old male with fall and knee pain. EXAM: LEFT KNEE - 1-2 VIEW COMPARISON:  None. FINDINGS: Chondrocalcinosis which can be seen in the setting  of calcium pyrophosphate deposition disease. Otherwise mild tricompartmental degenerative changes. Patella appears intact. On cross-table lateral views there is no significant joint effusion. No acute osseous abnormality identified. Mild calcified peripheral vascular disease. IMPRESSION: 1. No acute fracture or dislocation identified about the left knee. 2. 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 Electronically Signed   By: Genevie Ann M.D.   On: 06/03/2020 11:14   CT Head Wo Contrast  Result Date: 06/01/2020 CLINICAL DATA:  Fall yesterday EXAM: CT HEAD WITHOUT CONTRAST CT CERVICAL SPINE WITHOUT CONTRAST TECHNIQUE: Multidetector CT imaging of the head and cervical spine was performed following the standard protocol without intravenous contrast. Multiplanar CT image reconstructions of the cervical spine were also generated. COMPARISON:  None. FINDINGS: CT HEAD FINDINGS Brain: No evidence of acute infarction, hemorrhage, hydrocephalus, extra-axial collection or  mass lesion/mass effect. Periventricular white matter hypodensity. Vascular: No hyperdense vessel or unexpected calcification. Skull: Normal. Negative for fracture or focal lesion. Sinuses/Orbits: No acute finding. Other: None. CT CERVICAL SPINE FINDINGS Alignment: Normal. Skull base and vertebrae: No acute fracture. No primary bone lesion or focal pathologic process. Soft tissues and spinal canal: No prevertebral fluid or swelling. No visible canal hematoma. Disc levels: Severe disc space height and osteophytosis of C4 through C6. Upper chest: Negative. Other: None. IMPRESSION: 1. No acute intracranial pathology. Small-vessel white matter disease. 2. No fracture or static subluxation of the cervical spine. 3. Severe disc space height and osteophytosis of C4 through C6. Electronically Signed   By: Eddie Candle M.D.   On: 06/01/2020 15:32   CT Cervical Spine Wo Contrast  Result Date: 06/01/2020 CLINICAL  DATA:  Fall yesterday EXAM: CT HEAD WITHOUT CONTRAST CT CERVICAL SPINE WITHOUT CONTRAST TECHNIQUE: Multidetector CT imaging of the head and cervical spine was performed following the standard protocol without intravenous contrast. Multiplanar CT image reconstructions of the cervical spine were also generated. COMPARISON:  None. FINDINGS: CT HEAD FINDINGS Brain: No evidence of acute infarction, hemorrhage, hydrocephalus, extra-axial collection or mass lesion/mass effect. Periventricular white matter hypodensity. Vascular: No hyperdense vessel or unexpected calcification. Skull: Normal. Negative for fracture or focal lesion. Sinuses/Orbits: No acute finding. Other: None. CT CERVICAL SPINE FINDINGS Alignment: Normal. Skull base and vertebrae: No acute fracture. No primary bone lesion or focal pathologic process. Soft tissues and spinal canal: No prevertebral fluid or swelling. No visible canal hematoma. Disc levels: Severe disc space height and osteophytosis of C4 through C6. Upper chest: Negative. Other: None. IMPRESSION: 1. No acute intracranial pathology. Small-vessel white matter disease. 2. No fracture or static subluxation of the cervical spine. 3. Severe disc space height and osteophytosis of C4 through C6. Electronically Signed   By: Eddie Candle M.D.   On: 06/01/2020 15:32   MR BRAIN WO CONTRAST  Result Date: 06/02/2020 CLINICAL DATA:  Fall.  Slurred speech.  Confusion. EXAM: MRI HEAD WITHOUT CONTRAST TECHNIQUE: Multiplanar, multiecho pulse sequences of the brain and surrounding structures were obtained without intravenous contrast. COMPARISON:  Head CT 06/01/2020 FINDINGS: The study is mildly motion degraded. Brain: There is no evidence of an acute infarct, intracranial hemorrhage, mass, midline shift, or extra-axial fluid collection. There is mild generalized cerebral atrophy. T2 hyperintensities in the cerebral white matter are nonspecific but compatible with minimal chronic small vessel ischemic  disease, within normal limits for age. Vascular: Major intracranial vascular flow voids are preserved. Skull and upper cervical spine: Unremarkable bone marrow signal. Sinuses/Orbits: Unremarkable orbits. Paranasal sinuses and mastoid air cells are clear. Other: None. IMPRESSION: No acute intracranial abnormality. Electronically Signed   By: Logan Bores M.D.   On: 06/02/2020 11:32   DG Chest Portable 1 View  Result Date: 06/01/2020 CLINICAL DATA:  80 year old male with weakness. EXAM: PORTABLE CHEST 1 VIEW COMPARISON:  Chest radiograph dated 04/24/2017. FINDINGS: Shallow inspiration with left lung base atelectasis or infiltrate. There is no pleural effusion pneumothorax. The cardiac silhouette is within limits. Atherosclerotic calcification of the aorta. Degenerative changes of spine. No acute osseous pathology. IMPRESSION: Shallow inspiration with left lung base atelectasis or infiltrate. Electronically Signed   By: Anner Crete M.D.   On: 06/01/2020 18:09   DG Ankle Left Port  Result Date: 06/03/2020 CLINICAL DATA:  Pain left leg and ankle. EXAM: PORTABLE LEFT ANKLE - 2 VIEW COMPARISON:  No prior. FINDINGS: Mild soft tissue swelling Corticated bony density noted  adjacent to the medial malleolus. This is most likely an old fracture fragment. No acute bony or joint abnormality identified. Peripheral vascular calcification. IMPRESSION: 1. Mild soft tissue swelling. Corticated bony density noted adjacent to the medial malleolus. This is most likely an old fracture fragment. No acute abnormality identified. 2.  Peripheral vascular disease. Electronically Signed   By: Marcello Moores  Register   On: 06/03/2020 15:06   DG Abd Portable 1V  Result Date: 06/04/2020 CLINICAL DATA:  Vomiting. EXAM: PORTABLE ABDOMEN - 1 VIEW COMPARISON:  None. FINDINGS: Gaseous gastric distension. Mild gaseous distension of ascending and transverse colon. No obvious small bowel dilatation. No visualized radiopaque calculi or abnormal  soft tissue calcifications. No acute osseous abnormalities are seen. IMPRESSION: Nonspecific gaseous gastric distension. Mild gaseous distension of ascending and transverse colon. No evidence of small bowel obstruction. Electronically Signed   By: Keith Rake M.D.   On: 06/04/2020 20:05   DG Foot 2 Views Right  Result Date: 06/02/2020 CLINICAL DATA:  Right foot pain and swelling, initial encounter EXAM: RIGHT FOOT - 2 VIEW COMPARISON:  09/18/2019 FINDINGS: Generalized osteopenia is noted. Mild widening at the base of the second proximal phalanx is noted when compared with the prior exam suspicious for prior fracture and healing. No acute fracture is noted. Small calcaneal spurs are seen. IMPRESSION: No acute fracture noted. Changes suspicious for prior fracture with healing at the base of the second proximal phalanx. Generalized osteopenia is noted. Electronically Signed   By: Inez Catalina M.D.   On: 06/02/2020 18:30     Subjective:  Complains of pain in left ankle area, denies any fever, chills, still reporting some nausea.  Discharge Exam: Vitals:   06/07/20 1820 06/07/20 2313  BP: (!) 150/80 129/69  Pulse: 100 99  Resp: 18 18  Temp: 98.2 F (36.8 C) 98.1 F (36.7 C)  SpO2: 95% 95%   Vitals:   06/06/20 1700 06/07/20 0647 06/07/20 1820 06/07/20 2313  BP: 126/81 136/72 (!) 150/80 129/69  Pulse: 94 92 100 99  Resp: 17 18 18 18   Temp: 98.2 F (36.8 C) 98 F (36.7 C) 98.2 F (36.8 C) 98.1 F (36.7 C)  TempSrc: Axillary Axillary Oral Oral  SpO2: 93% 91% 95% 95%  Weight:      Height:        General: Pt is awake, confused, pleasant, easily distracted  Cardiovascular: RRR, S1/S2 +, no rubs, no gallops Respiratory: CTA bilaterally, no wheezing, no rhonchi Abdominal: Soft, NT, ND, bowel sounds + Extremities: no edema, no cyanosis    The results of significant diagnostics from this hospitalization (including imaging, microbiology, ancillary and laboratory) are listed below  for reference.     Microbiology: Recent Results (from the past 240 hour(s))  Resp Panel by RT-PCR (Flu A&B, Covid) Nasopharyngeal Swab     Status: None   Collection Time: 06/01/20  3:54 PM   Specimen: Nasopharyngeal Swab; Nasopharyngeal(NP) swabs in vial transport medium  Result Value Ref Range Status   SARS Coronavirus 2 by RT PCR NEGATIVE NEGATIVE Final    Comment: (NOTE) SARS-CoV-2 target nucleic acids are NOT DETECTED.  The SARS-CoV-2 RNA is generally detectable in upper respiratory specimens during the acute phase of infection. The lowest concentration of SARS-CoV-2 viral copies this assay can detect is 138 copies/mL. A negative result does not preclude SARS-Cov-2 infection and should not be used as the sole basis for treatment or other patient management decisions. A negative result may occur with  improper specimen collection/handling, submission  of specimen other than nasopharyngeal swab, presence of viral mutation(s) within the areas targeted by this assay, and inadequate number of viral copies(<138 copies/mL). A negative result must be combined with clinical observations, patient history, and epidemiological information. The expected result is Negative.  Fact Sheet for Patients:  EntrepreneurPulse.com.au  Fact Sheet for Healthcare Providers:  IncredibleEmployment.be  This test is no t yet approved or cleared by the [place] FDA and  has been authorized for detection and/or diagnosis of SARS-CoV-2 by FDA under an Emergency Use Authorization (EUA). This EUA will remain  in effect (meaning this test can be used) for the duration of the COVID-19 declaration under Section 564(b)(1) of the Act, 21 U.S.C.section 360bbb-3(b)(1), unless the authorization is terminated  or revoked sooner.       Influenza A by PCR NEGATIVE NEGATIVE Final   Influenza B by PCR NEGATIVE NEGATIVE Final    Comment: (NOTE) The Xpert Xpress  SARS-CoV-2/FLU/RSV plus assay is intended as an aid in the diagnosis of influenza from Nasopharyngeal swab specimens and should not be used as a sole basis for treatment. Nasal washings and aspirates are unacceptable for Xpert Xpress SARS-CoV-2/FLU/RSV testing.  Fact Sheet for Patients: EntrepreneurPulse.com.au  Fact Sheet for Healthcare Providers: IncredibleEmployment.be  This test is not yet approved or cleared by the [place] FDA and has been authorized for detection and/or diagnosis of SARS-CoV-2 by FDA under an Emergency Use Authorization (EUA). This EUA will remain in effect (meaning this test can be used) for the duration of the COVID-19 declaration under Section 564(b)(1) of the Act, 21 U.S.C. section 360bbb-3(b)(1), unless the authorization is terminated or revoked.  Performed at [facility], [place] [address], [place], [place] [address]   Blood culture (routine x 2)     Status: None   Collection Time: 06/01/20  5:32 PM   Specimen: BLOOD  Result Value Ref Range Status   Specimen Description BLOOD BLOOD RIGHT FOREARM  Final   Special Requests   Final    BOTTLES DRAWN AEROBIC AND ANAEROBIC Blood Culture adequate volume   Culture   Final    NO GROWTH 5 DAYS Performed at [facility], [place] [address], [place], [place] [address]    Report Status 06/06/2020 FINAL  Final  Blood culture (routine x 2)     Status: None   Collection Time: 06/01/20  6:25 PM   Specimen: BLOOD RIGHT HAND  Result Value Ref Range Status   Specimen Description BLOOD RIGHT HAND  Final   Special Requests   Final    BOTTLES DRAWN AEROBIC AND ANAEROBIC Blood Culture results may not be optimal due to an inadequate volume of blood received in culture bottles   Culture   Final    NO GROWTH 5 DAYS Performed at [facility], [place] [address], [place], [place] [address]    Report Status 06/06/2020 FINAL  Final     Labs: BNP (last 3 results) No  results for input(s): BNP in the last 8760 hours. Basic Metabolic Panel: Recent Labs  Lab 06/01/20 2049 06/02/20 0051 06/03/20 0046 06/03/20 1723 06/04/20 0132 06/05/20 0418  NA 136 135 141 141 141 142  K 3.0* 2.8* 2.7* 2.7* 2.9* 3.3*  CL 107 105 110 114* 113* 114*  CO2 12* 12* 14* 17* 17* 16*  GLUCOSE 156* 147* 144* 116* 112* 112*  BUN 114* 111* 87* 66* 59* 39*  CREATININE 4.43* 4.41* 3.01* 2.31* 2.01* 1.64*  CALCIUM 8.6* 8.3* 7.7* 7.3*  7.4* 8.0*  MG 2.8* 2.8* 2.3  --   --   --   PHOS  --  6.8*  --   --   --   --    Liver Function Tests: Recent Labs  Lab 06/01/20 1455 06/02/20 0051  AST 14* 15  ALT 15 16  ALKPHOS 51 48  BILITOT 0.7 0.5  PROT 6.3* 5.7*  ALBUMIN 3.0* 2.8*   No results for input(s): LIPASE, AMYLASE in the last 168 hours. Recent Labs  Lab 06/01/20 2049  AMMONIA 26   CBC: Recent Labs  Lab 06/01/20 1455 06/02/20 0051 06/03/20 0046 06/04/20 0132 06/05/20 0418  WBC 23.9* 20.2* 16.2* 13.5* 13.4*  NEUTROABS 20.4* 17.3*  --   --   --   HGB 9.8* 9.7* 8.8* 8.2* 8.1*  HCT 28.7* 27.7* 24.4* 23.5* 23.1*  MCV 102.5* 99.6 98.0 99.2 100.4*  PLT 291 253 253 231 244   Cardiac Enzymes: Recent Labs  Lab 06/01/20 2049  CKTOTAL 61   BNP: Invalid input(s): POCBNP CBG: No results for input(s): GLUCAP in the last 168 hours. D-Dimer No results for input(s): DDIMER in the last 72 hours. Hgb A1c No results for input(s): HGBA1C in the last 72 hours. Lipid Profile No results for input(s): CHOL, HDL, LDLCALC, TRIG, CHOLHDL, LDLDIRECT in the last 72 hours. Thyroid function studies No results for input(s): TSH, T4TOTAL, T3FREE, THYROIDAB in the last 72 hours.  Invalid input(s): FREET3 Anemia work up No results for input(s): VITAMINB12, FOLATE, FERRITIN, TIBC, IRON, RETICCTPCT in the last 72 hours. Urinalysis    Component Value Date/Time   COLORURINE YELLOW 06/01/2020 1455   APPEARANCEUR HAZY (A) 06/01/2020 1455   LABSPEC 1.013 06/01/2020 1455   PHURINE  5.0 06/01/2020 1455   GLUCOSEU NEGATIVE 06/01/2020 1455   GLUCOSEU NEGATIVE 03/27/2019 1437   HGBUR NEGATIVE 06/01/2020 1455   BILIRUBINUR NEGATIVE 06/01/2020 1455   KETONESUR NEGATIVE 06/01/2020 1455   PROTEINUR 30 (A) 06/01/2020 1455   UROBILINOGEN 0.2 03/27/2019 1437   NITRITE NEGATIVE 06/01/2020 1455   LEUKOCYTESUR NEGATIVE 06/01/2020 1455   Sepsis Labs Invalid input(s): PROCALCITONIN,  WBC,  LACTICIDVEN Microbiology Recent Results (from the past 240 hour(s))  Resp Panel by RT-PCR (Flu A&B, Covid) Nasopharyngeal Swab     Status: None   Collection Time: 06/01/20  3:54 PM   Specimen: Nasopharyngeal Swab; Nasopharyngeal(NP) swabs in vial transport medium  Result Value Ref Range Status   SARS Coronavirus 2 by RT PCR NEGATIVE NEGATIVE Final    Comment: (NOTE) SARS-CoV-2 target nucleic acids are NOT DETECTED.  The SARS-CoV-2 RNA is generally detectable in upper respiratory specimens during the acute phase of infection. The lowest concentration of SARS-CoV-2 viral copies this assay can detect is 138 copies/mL. A negative result does not preclude SARS-Cov-2 infection and should not be used as the sole basis for treatment or other patient management decisions. A negative result may occur with  improper specimen collection/handling, submission of specimen other than nasopharyngeal swab, presence of viral mutation(s) within the areas targeted by this assay, and inadequate number of viral copies(<138 copies/mL). A negative result must be combined with clinical observations, patient history, and epidemiological information. The expected result is Negative.  Fact Sheet for Patients:  EntrepreneurPulse.com.au  Fact Sheet for Healthcare Providers:  IncredibleEmployment.be  This test is no t yet approved or cleared by the [place] FDA and  has been authorized for detection and/or diagnosis of SARS-CoV-2 by FDA under an Emergency Use Authorization  (EUA). This EUA will remain  in effect (meaning this test can be used) for the duration of the COVID-19 declaration under Section 564(b)(1) of the Act, 21 U.S.C.section 360bbb-3(b)(1), unless the authorization is terminated  or revoked sooner.       Influenza A by PCR NEGATIVE NEGATIVE Final   Influenza B by PCR NEGATIVE NEGATIVE Final    Comment: (NOTE) The Xpert Xpress SARS-CoV-2/FLU/RSV plus assay is intended as an aid in the diagnosis of influenza from Nasopharyngeal swab specimens and should not be used as a sole basis for treatment. Nasal washings and aspirates are unacceptable for Xpert Xpress SARS-CoV-2/FLU/RSV testing.  Fact Sheet for Patients: EntrepreneurPulse.com.au  Fact Sheet for Healthcare Providers: IncredibleEmployment.be  This test is not yet approved or cleared by the [place] FDA and has been authorized for detection and/or diagnosis of SARS-CoV-2 by FDA under an Emergency Use Authorization (EUA). This EUA will remain in effect (meaning this test can be used) for the duration of the COVID-19 declaration under Section 564(b)(1) of the Act, 21 U.S.C. section 360bbb-3(b)(1), unless the authorization is terminated or revoked.  Performed at [facility], [place] [address], [place], [place] [address]   Blood culture (routine x 2)     Status: None   Collection Time: 06/01/20  5:32 PM   Specimen: BLOOD  Result Value Ref Range Status   Specimen Description BLOOD BLOOD RIGHT FOREARM  Final   Special Requests   Final    BOTTLES DRAWN AEROBIC AND ANAEROBIC Blood Culture adequate volume   Culture   Final    NO GROWTH 5 DAYS Performed at [facility], [place] [address], [place], [place] [address]    Report Status 06/06/2020 FINAL  Final  Blood culture (routine x 2)     Status: None   Collection Time: 06/01/20  6:25 PM   Specimen: BLOOD RIGHT HAND  Result Value Ref Range Status   Specimen Description BLOOD RIGHT HAND   Final   Special Requests   Final    BOTTLES DRAWN AEROBIC AND ANAEROBIC Blood Culture results may not be optimal due to an inadequate volume of blood received in culture bottles   Culture   Final    NO GROWTH 5 DAYS Performed at [facility], [place] [address] [place], [place] [address]    Report Status 06/06/2020 FINAL  Final     Time coordinating discharge: Over 30 minutes  SIGNED:   Phillips Climes, MD  [facility] 06/08/2020, 11:04 AM Pager   If 7PM-7AM, please contact night-coverage [email or web address] Password [email or web address]

## 2020-06-08 NOTE — TOC Progression Note (Signed)
Transition of Care [facility]) - Progression Note    Patient Details  Name: SHREY BOIKE MRN: [id number] Date of Birth: 1940/09/28  Transition of Care [employer]) CM/SW [place], LCSW Phone Number: 06/08/2020, 12:47 PM  Clinical Narrative:    Helene Kelp will let CSW know when they have determine which bed to put patient in since he is unvaccinated for COVID.    Expected Discharge Plan: [place] Barriers to Discharge: Continued Medical Work up,SNF Pending bed offer  Expected Discharge Plan and Services Expected Discharge Plan: [place] In-house Referral: Clinical Social Work   Post Acute Care Choice: [place] Living arrangements for the past 2 months: Single Family Home Expected Discharge Date: 06/08/20                                     Social Determinants of Health (SDOH) Interventions    Readmission Risk Interventions No flowsheet data found.

## 2020-06-09 ENCOUNTER — Encounter: Payer: Self-pay | Admitting: Adult Health

## 2020-06-09 ENCOUNTER — Non-Acute Institutional Stay (SKILLED_NURSING_FACILITY): Payer: Medicare Other | Admitting: Adult Health

## 2020-06-09 DIAGNOSIS — F419 Anxiety disorder, unspecified: Secondary | ICD-10-CM | POA: Diagnosis not present

## 2020-06-09 DIAGNOSIS — I10 Essential (primary) hypertension: Secondary | ICD-10-CM | POA: Diagnosis not present

## 2020-06-09 DIAGNOSIS — G894 Chronic pain syndrome: Secondary | ICD-10-CM | POA: Diagnosis not present

## 2020-06-09 DIAGNOSIS — R339 Retention of urine, unspecified: Secondary | ICD-10-CM | POA: Diagnosis not present

## 2020-06-09 DIAGNOSIS — N179 Acute kidney failure, unspecified: Secondary | ICD-10-CM | POA: Diagnosis not present

## 2020-06-09 NOTE — Progress Notes (Signed)
Location:  [place] Room Number: [place] of Service:  SNF (31) Provider:  Durenda Age, DNP, FNP-BC  Patient Care Team: Cassandria Anger, MD as PCP - General  Extended Emergency Contact Information Primary Emergency Contact: Wynelle Link [place] of [place] Phone: [phone] Relation: Daughter Secondary Emergency Contact: Janis,Pamela Address: [address]          [place] [address] Johnnette Litter of [place] Phone: [phone] Mobile Phone: [phone] Relation: Spouse  Code Status:  DNR  Goals of care: Advanced Directive information Advanced Directives 06/09/2020  Does Patient Have a Medical Advance Directive? Yes  Type of Advance Directive Out of facility DNR (pink MOST or yellow form)  Does patient want to make changes to medical advance directive? No - Patient declined  Copy of [place] in Chart? -  Would patient like information on creating a medical advance directive? -  Pre-existing out of facility DNR order (yellow form or pink MOST form) Yellow form placed in chart (order not valid for inpatient use);Pink MOST form placed in chart (order not valid for inpatient use)     Chief Complaint  Patient presents with  . Medical Management of Chronic Issues    Follow up admission    HPI:  Pt is a 80 y.o. male who was admitted to [facility] on 06/08/20 post hospital admission 06/01/20 to 06/08/20 with acute kidney injury superimposed on stage IIIb chronic kidney disease after presenting from home to [name], ED complaining of slurred speech without facial droop. He was reported to have had a fall at home on 05/31/20 while ambulating in their home without loss of consciousness.  He appeared to be confused as reported by the wife.  He was reported to be nonambulatory/bedbound for few months now, with very poor compliance, with multiple comorbidities and poor quality  and significant for deconditioning and functional ambulatory disorder.  Palliative care was consulted.  He was reported to be nonambulatory/bedbound for a few months now, noncompliance with multiple comorbidities.  Family wanted to pursue comfort measures, a MOST form has been completed.  He was started on Ativan 1 mg every 6 hours round-the-clock for his severe anxiety, as well with trazodone PRN for sleep.  He was seen in his room today with daughter and wife at bedside. Daughter stated that patient has wound on his right foot second toe and was needing amputation of the toe but family opted out and was  Wanting hospice care.   Past Medical History:  Diagnosis Date  . Arthritis   . Chronic kidney disease   . Colitis   . Diarrhea   . Eczema   . Elevated glucose   . HTN (hypertension)   . Hyperlipidemia   . Pneumonia   . Shingles 2009   Past Surgical History:  Procedure Laterality Date  . LUMBAR LAMINECTOMY/DECOMPRESSION MICRODISCECTOMY N/A 01/11/2016   Procedure: Lumbar two- three, Lumbar three- four, Lumbar four- five, Lumbar five-Sacral one Laminectomy;  Surgeon: Kristeen Miss, MD;  Location: [place];  Service: Neurosurgery;  Laterality: N/A;  L2-3 L3-4 L4-5 L5-S1 Laminectomy  . TONSILLECTOMY     Age 80    No Known Allergies  Outpatient Encounter Medications as of 06/09/2020  Medication Sig  . gabapentin (NEURONTIN) 100 MG capsule TAKE 1 CAPSULE BY MOUTH THREE TIMES A DAY  . LORazepam (ATIVAN) 1 MG tablet Take 1 tablet (1 mg total) by mouth every 6 (six) hours.  Marland Kitchen  LORazepam (ATIVAN) 1 MG tablet Take 1 tablet (1 mg total) by mouth every 6 (six) hours as needed for up to 5 days for anxiety.  . metoprolol succinate (TOPROL-XL) 25 MG 24 hr tablet Take 1 tablet (25 mg total) by mouth daily.  . ondansetron (ZOFRAN) 4 MG tablet Take 1 tablet (4 mg total) by mouth every 6 (six) hours as needed for nausea or vomiting.  Marland Kitchen oxyCODONE (OXYCONTIN) 10 mg 12 hr tablet Take 1 tablet (10 mg total) by  mouth every 12 (twelve) hours.  Marland Kitchen oxyCODONE-acetaminophen (PERCOCET/ROXICET) 5-325 MG tablet Take 1 tablet by mouth every 4 (four) hours as needed for severe pain.  . pantoprazole (PROTONIX) 40 MG tablet Take 1 tablet (40 mg total) by mouth daily.  . tamsulosin (FLOMAX) 0.4 MG CAPS capsule TAKE 1 CAPSULE BY MOUTH EVERY DAY  . traZODone (DESYREL) 50 MG tablet Take 1 tablet (50 mg total) by mouth at bedtime.   No facility-administered encounter medications on file as of 06/09/2020.    Review of Systems  GENERAL: No change in appetite, no fatigue, no weight changes, no fever or chills  MOUTH and THROAT: Denies oral discomfort, gingival pain or bleeding  RESPIRATORY: no cough, SOB, DOE, wheezing, hemoptysis CARDIAC: No chest pain, edema or palpitations GI: No abdominal pain, diarrhea, constipation, heart burn, nausea or vomiting GU: Denies dysuria, frequency, hematuria, incontinence, or discharge NEUROLOGICAL: Denies dizziness, syncope, numbness, or headache PSYCHIATRIC: Denies feelings of depression or anxiety. No report of hallucinations, insomnia, paranoia, or agitation   There is no immunization history for the selected administration types on file for this patient. Pertinent  Health Maintenance Due  Topic Date Due  . PNA vac Low Risk Adult (1 of 2 - PCV13) Never done  . INFLUENZA VACCINE  08/03/2020   Fall Risk  04/01/2020 04/30/2019 03/27/2019 11/26/2018 08/02/2018  Falls in the past year? 0 1 1 0 (No Data)  Comment - - - Emmi Telephone Survey: data to providers prior to load Emmi Telephone Survey: data to providers prior to load  Number falls in past yr: 0 1 1 - (No Data)  Comment - - - - Emmi Telephone Survey Actual Response =   Injury with Fall? 0 1 1 - -  Risk for fall due to : - Impaired balance/gait;Impaired mobility;Orthopedic patient - - -  Follow up - Falls evaluation completed;Education provided;Falls prevention discussed - - -     Vitals:   06/09/20 1009  BP: (!) 148/78   Pulse: 88  Resp: 17  Temp: 97.7 F (36.5 C)  Weight: 178 lb 12.6 oz (81.1 kg)  Height: 6' (1.829 m)   Body mass index is 24.25 kg/m.  Physical Exam  GENERAL APPEARANCE: Well nourished. In no acute distress. Normal body habitus SKIN:  Has dry flaky facial skin, right foot with dressing.  MOUTH and THROAT: Lips are without lesions. Oral mucosa is moist and without lesions. Tongue is normal in shape, size, and color and without lesions RESPIRATORY: Breathing is even & unlabored, BS CTAB CARDIAC: RRR, no murmur,no extra heart sounds GI: Abdomen soft, normal BS, no masses, no tenderness GU:  Has foley catheter french 16 NEUROLOGICAL: There is no tremor. Speech is clear. Alert and oriented X 3.  Spastic LLE. PSYCHIATRIC:  Agitated and anxious  Labs reviewed: Recent Labs    06/01/20 2049 06/02/20 0051 06/03/20 0046 06/03/20 1723 06/04/20 0132 06/05/20 0418  NA 136 135 141 141 141 142  K 3.0* 2.8* 2.7* 2.7* 2.9* 3.3*  CL 107 105 110 114* 113* 114*  CO2 12* 12* 14* 17* 17* 16*  GLUCOSE 156* 147* 144* 116* 112* 112*  BUN 114* 111* 87* 66* 59* 39*  CREATININE 4.43* 4.41* 3.01* 2.31* 2.01* 1.64*  CALCIUM 8.6* 8.3* 7.7* 7.3* 7.4* 8.0*  MG 2.8* 2.8* 2.3  --   --   --   PHOS  --  6.8*  --   --   --   --    Recent Labs    04/01/20 1525 06/01/20 1455 06/02/20 0051  AST 15 14* 15  ALT 13 15 16   ALKPHOS 59 51 48  BILITOT 0.4 0.7 0.5  PROT 6.6 6.3* 5.7*  ALBUMIN 4.0 3.0* 2.8*   Recent Labs    04/01/20 1525 06/01/20 1455 06/02/20 0051 06/03/20 0046 06/04/20 0132 06/05/20 0418  WBC 9.0 23.9* 20.2* 16.2* 13.5* 13.4*  NEUTROABS 6.5 20.4* 17.3*  --   --   --   HGB 11.7* 9.8* 9.7* 8.8* 8.2* 8.1*  HCT 34.4* 28.7* 27.7* 24.4* 23.5* 23.1*  MCV 100.5* 102.5* 99.6 98.0 99.2 100.4*  PLT 202.0 291 253 253 231 244   Lab Results  Component Value Date   TSH 1.083 06/01/2020   Lab Results  Component Value Date   HGBA1C 5.6 03/27/2019   Lab Results  Component Value Date    CHOL 238 (H) 03/27/2019   HDL 29.40 (L) 03/27/2019   LDLCALC 102 (H) 06/13/2013   LDLDIRECT 141.0 03/27/2019   TRIG 304.0 (H) 03/27/2019   CHOLHDL 8 03/27/2019    Significant Diagnostic Results in last 30 days:  DG Knee 1-2 Views Left  Result Date: 06/03/2020 CLINICAL DATA:  80 year old male with fall and knee pain. EXAM: LEFT KNEE - 1-2 VIEW COMPARISON:  None. 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 Otherwise mild tricompartmental degenerative changes. Patella appears intact. On cross-table lateral views there is no significant joint effusion. No acute osseous abnormality identified. Mild calcified peripheral vascular disease. IMPRESSION: 1. No acute fracture or dislocation identified about the left knee. 2. 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 Electronically Signed   By: Genevie Ann M.D.   On: 06/03/2020 11:14   CT Head Wo Contrast  Result Date: 06/01/2020 CLINICAL DATA:  Fall yesterday EXAM: CT HEAD WITHOUT CONTRAST CT CERVICAL SPINE WITHOUT CONTRAST TECHNIQUE: Multidetector CT imaging of the head and cervical spine was performed following the standard protocol without intravenous contrast. Multiplanar CT image reconstructions of the cervical spine were also generated. COMPARISON:  None. FINDINGS: CT HEAD FINDINGS Brain: No evidence of acute infarction, hemorrhage, hydrocephalus, extra-axial collection or mass lesion/mass effect. Periventricular white matter hypodensity. Vascular: No hyperdense vessel or unexpected calcification. Skull: Normal. Negative for fracture or focal lesion. Sinuses/Orbits: No acute finding. Other: None. CT CERVICAL SPINE FINDINGS Alignment: Normal. Skull base and vertebrae: No acute fracture. No primary bone lesion or focal pathologic process. Soft tissues and spinal canal: No prevertebral fluid or swelling. No visible canal hematoma. Disc levels: Severe  disc space height and osteophytosis of C4 through C6. Upper chest: Negative. Other: None. IMPRESSION: 1. No acute intracranial pathology. Small-vessel white matter disease. 2. No fracture or static subluxation of the cervical spine. 3. Severe disc space height and osteophytosis of C4 through C6. Electronically Signed   By: Eddie Candle M.D.   On: 06/01/2020 15:32   CT Cervical Spine Wo Contrast  Result Date: 06/01/2020 CLINICAL DATA:  Fall yesterday  EXAM: CT HEAD WITHOUT CONTRAST CT CERVICAL SPINE WITHOUT CONTRAST TECHNIQUE: Multidetector CT imaging of the head and cervical spine was performed following the standard protocol without intravenous contrast. Multiplanar CT image reconstructions of the cervical spine were also generated. COMPARISON:  None. FINDINGS: CT HEAD FINDINGS Brain: No evidence of acute infarction, hemorrhage, hydrocephalus, extra-axial collection or mass lesion/mass effect. Periventricular white matter hypodensity. Vascular: No hyperdense vessel or unexpected calcification. Skull: Normal. Negative for fracture or focal lesion. Sinuses/Orbits: No acute finding. Other: None. CT CERVICAL SPINE FINDINGS Alignment: Normal. Skull base and vertebrae: No acute fracture. No primary bone lesion or focal pathologic process. Soft tissues and spinal canal: No prevertebral fluid or swelling. No visible canal hematoma. Disc levels: Severe disc space height and osteophytosis of C4 through C6. Upper chest: Negative. Other: None. IMPRESSION: 1. No acute intracranial pathology. Small-vessel white matter disease. 2. No fracture or static subluxation of the cervical spine. 3. Severe disc space height and osteophytosis of C4 through C6. Electronically Signed   By: Eddie Candle M.D.   On: 06/01/2020 15:32   MR BRAIN WO CONTRAST  Result Date: 06/02/2020 CLINICAL DATA:  Fall.  Slurred speech.  Confusion. EXAM: MRI HEAD WITHOUT CONTRAST TECHNIQUE: Multiplanar, multiecho pulse sequences of the brain and surrounding  structures were obtained without intravenous contrast. COMPARISON:  Head CT 06/01/2020 FINDINGS: The study is mildly motion degraded. Brain: There is no evidence of an acute infarct, intracranial hemorrhage, mass, midline shift, or extra-axial fluid collection. There is mild generalized cerebral atrophy. T2 hyperintensities in the cerebral white matter are nonspecific but compatible with minimal chronic small vessel ischemic disease, within normal limits for age. Vascular: Major intracranial vascular flow voids are preserved. Skull and upper cervical spine: Unremarkable bone marrow signal. Sinuses/Orbits: Unremarkable orbits. Paranasal sinuses and mastoid air cells are clear. Other: None. IMPRESSION: No acute intracranial abnormality. Electronically Signed   By: Logan Bores M.D.   On: 06/02/2020 11:32   DG Chest Portable 1 View  Result Date: 06/01/2020 CLINICAL DATA:  80 year old male with weakness. EXAM: PORTABLE CHEST 1 VIEW COMPARISON:  Chest radiograph dated 04/24/2017. FINDINGS: Shallow inspiration with left lung base atelectasis or infiltrate. There is no pleural effusion pneumothorax. The cardiac silhouette is within limits. Atherosclerotic calcification of the aorta. Degenerative changes of spine. No acute osseous pathology. IMPRESSION: Shallow inspiration with left lung base atelectasis or infiltrate. Electronically Signed   By: Anner Crete M.D.   On: 06/01/2020 18:09   DG Ankle Left Port  Result Date: 06/03/2020 CLINICAL DATA:  Pain left leg and ankle. EXAM: PORTABLE LEFT ANKLE - 2 VIEW COMPARISON:  No prior. FINDINGS: Mild soft tissue swelling Corticated bony density noted adjacent to the medial malleolus. This is most likely an old fracture fragment. No acute bony or joint abnormality identified. Peripheral vascular calcification. IMPRESSION: 1. Mild soft tissue swelling. Corticated bony density noted adjacent to the medial malleolus. This is most likely an old fracture fragment. No acute  abnormality identified. 2.  Peripheral vascular disease. Electronically Signed   By: Marcello Moores  Register   On: 06/03/2020 15:06   DG Abd Portable 1V  Result Date: 06/04/2020 CLINICAL DATA:  Vomiting. EXAM: PORTABLE ABDOMEN - 1 VIEW COMPARISON:  None. FINDINGS: Gaseous gastric distension. Mild gaseous distension of ascending and transverse colon. No obvious small bowel dilatation. No visualized radiopaque calculi or abnormal soft tissue calcifications. No acute osseous abnormalities are seen. IMPRESSION: Nonspecific gaseous gastric distension. Mild gaseous distension of ascending and transverse colon. No evidence of small bowel  obstruction. Electronically Signed   By: Keith Rake M.D.   On: 06/04/2020 20:05   DG Foot 2 Views Right  Result Date: 06/02/2020 CLINICAL DATA:  Right foot pain and swelling, initial encounter EXAM: RIGHT FOOT - 2 VIEW COMPARISON:  09/18/2019 FINDINGS: Generalized osteopenia is noted. Mild widening at the base of the second proximal phalanx is noted when compared with the prior exam suspicious for prior fracture and healing. No acute fracture is noted. Small calcaneal spurs are seen. IMPRESSION: No acute fracture noted. Changes suspicious for prior fracture with healing at the base of the second proximal phalanx. Generalized osteopenia is noted. Electronically Signed   By: Inez Catalina M.D.   On: 06/02/2020 18:30    Assessment/Plan  1. Acute kidney injury [facility]) Lab Results  Component Value Date   NA 142 06/05/2020   K 3.3 (L) 06/05/2020   CO2 16 (L) 06/05/2020   GLUCOSE 112 (H) 06/05/2020   BUN 39 (H) 06/05/2020   CREATININE 1.64 (H) 06/05/2020   CALCIUM 8.0 (L) 06/05/2020   GFRNONAA 42 (L) 06/05/2020   GFRAA 47 (L) 09/18/2019   -  Continue comfort  care  2. Urinary retention -   Has foley catheter fr 16 -   Continue tamsulosin  3. Essential hypertension -  Stable, continue metoprolol succinate ER  4. Anxiety -  Anxious -Continue Ativan  5. Chronic pain  syndrome -   Stable, continue gabapentin teen and Percocet      Family/ staff Communication: Plan of care with resident, daughter, wife and charge nurse.  Labs/tests ordered:  None  Goals of care:   Hospice care   Durenda Age, DNP, MSN, FNP-BC [facility] and Adult Medicine [phone] (Monday-Friday 8:00 a.m. - 5:00 p.m.) [phone] (after hours)

## 2020-06-12 ENCOUNTER — Encounter: Payer: Self-pay | Admitting: Internal Medicine

## 2020-06-12 ENCOUNTER — Telehealth: Payer: Self-pay

## 2020-06-12 NOTE — Telephone Encounter (Signed)
Facility nurse Hilda Blades called and stated that patients current percocet prescription reads 4 times daily prn. Nurse is requesting for prescription to be changed to every four hours per patients request for pain management.  Please advise.  Call back number is [phone] Message routed to Dr. Linna Darner.

## 2020-06-15 ENCOUNTER — Encounter: Payer: Self-pay | Admitting: Internal Medicine

## 2020-06-15 ENCOUNTER — Non-Acute Institutional Stay (SKILLED_NURSING_FACILITY): Payer: Medicare Other | Admitting: Internal Medicine

## 2020-06-15 DIAGNOSIS — E876 Hypokalemia: Secondary | ICD-10-CM | POA: Diagnosis not present

## 2020-06-15 DIAGNOSIS — D72829 Elevated white blood cell count, unspecified: Secondary | ICD-10-CM

## 2020-06-15 DIAGNOSIS — N179 Acute kidney failure, unspecified: Secondary | ICD-10-CM | POA: Diagnosis not present

## 2020-06-15 DIAGNOSIS — Z515 Encounter for palliative care: Secondary | ICD-10-CM

## 2020-06-15 DIAGNOSIS — G9340 Encephalopathy, unspecified: Secondary | ICD-10-CM

## 2020-06-15 DIAGNOSIS — D649 Anemia, unspecified: Secondary | ICD-10-CM | POA: Diagnosis not present

## 2020-06-15 NOTE — Progress Notes (Signed)
NURSING HOME LOCATION:  [facility] ROOM NUMBER:  [id number]  CODE STATUS:  DNR  PCP:  Walker Kehr MD  This is a comprehensive admission note to this SNFperformed on this date less than 30 days from date of admission. Included are preadmission medical/surgical history; reconciled medication list; family history; social history and comprehensive review of systems.  Corrections and additions to the records were documented. Comprehensive physical exam was also performed. Additionally a clinical summary was entered for each active diagnosis pertinent to this admission in the Problem List to enhance continuity of care.  HPI: Patient was hospitalized 5/30 - 06/08/2020 presenting from home with slurred speech and confusion.  He had an unwitnessed fall 5/29; he was unaware of any new symptomatology or any cardiac or neurologic prodrome prior to the fall.  The fall was in the context of marked frailty and deconditioning as the patient had been nonambulatory and bedbound for several months.   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 His history was also characterized by extremely poor compliance with medications and recommendations.   The neurologic presentation was clinically felt to be secondary to acute metabolic encephalopathy in the setting of AKI/uremia with a baseline CKD 3B status.  Anion gap metabolic acidosis was present.  Initial BUN was 123 and creatinine 4.77; final BUN was 114 and creatinine 4.43.  Baseline renal function was felt to be a creatinine of 1.5-1.8. Also severe protein caloric malnutrition was documented with an albumin of 3.8 and total protein of 5.7.  Course was complicated by hypokalemia with a nadir potassium of 2.7.  Leukocytosis with white count is up to 24,000 was present.  An ulcer of the second right toe was documented as present prior to admission.  Wounds of the right foot and sacrum are also present. He exhibited anemia with H/H of 8.1/23;  FOBT was positive.  He received oxycodone for left ankle pain.  Imaging revealed osteopenia and old fracture but no definite acute process.  He was transitioned to every 12 hour OxyContin as maintenance with every 4 hours Percocet as needed. Course was also complicated by urinary retention despite tamsulosin. Palliative Care was consulted and comfort measures were pursued with completion of a MOST form.  He was to be discharged to SNF with transition to [place].  Past medical and surgical history: Also includes history of colitis, eczema, hyperglycemia, history of shingles, and dyslipidemia. Surgeries and procedures include lumbar laminectomy/microdiscectomy.  Social history: Nondrinker; never smoked.  Family history: Significant family history of hypertension.   Review of systems: Patient exhibits profound confusion with confabulation.   He never answered my question as to why he had been hospitalized.  He could not name his primary care physician. He initially told me that he had been working "hard all day and had just finished dinner" despite the fact that it was shortly past 9:30 AM.  He he stated that he would answer my questions when he could "find the metal container with the pills".  He seemingly confabulated about [place] "[place]s "and then laughed. Actually his wife & daughter later confirmed he may have been in [employer] @ that time. He went on to state that he was "not sick at all".  Physical exam:  Pertinent or positive findings: He appears chronically ill.  He is agitated ,picking at his skin.  There was no eye contact as he kept his head tilted to the left.  He has  pattern alopecia.  There is marked dermatitis of the face with scattered keratotic lesions with exfoliation and faint hyperemia.  His beard and mustache are disheveled.  Teeth are coated.  Excoriations are present over the left chest.  Heart sounds are markedly distant with a slight  tachycardia with a very faint flow murmur.  Bronchovesicular quality to the lungs is present with scattered low-grade rales.  Abdomen is protuberant.  Foley catheter is present.  Pedal pulses are not palpable.  There is marked interosseous wasting of the hands.  The right foot is dressed  & there is a shallow ulcer w slight eschar over the second right toe.  There is a shallow deficit with raised slightly keratotic border at the lateral aspect of the right great toe.  He cried out in pain when the ankles were touched or when the bed linen was lifted from the feet or placed back all over them.  Tattoos present over the right biceps area.  General appearance: no acute distress, increased work of breathing is present.   Lymphatic: No lymphadenopathy about the head, neck, axilla. Eyes: No conjunctival inflammation or lid edema is present. There is no scleral icterus. Ears:  External ear exam shows no significant lesions or deformities.   Nose:  External nasal examination shows no deformity or inflammation. Nasal mucosa are pink and moist without lesions, exudates Neck:  No thyromegaly, masses, tenderness noted.    Heart:  No click, rub.  Lungs: without wheezes, rhonchi, rubs. Abdomen: Bowel sounds are normal.  Abdomen is soft and nontender with no organomegaly, hernias, masses. GU: Deferred  Extremities:  No cyanosis, clubbing, edema. Neurologic exam: Balance, Rhomberg, finger to nose testing could not be completed due to clinical state Skin: Warm & dry   See clinical summary under each active problem in the Problem List with associated updated therapeutic plan

## 2020-06-16 NOTE — Assessment & Plan Note (Addendum)
Hospice care most appropriate. I shall se him only on emergency basis @ request of Dr Gildardo Cranker or her care team. Discussed w wife & daughter.

## 2020-06-16 NOTE — Patient Instructions (Signed)
.  assessm

## 2020-06-16 NOTE — Assessment & Plan Note (Addendum)
CKD Stage 4 Medication List reviewed; Gabapentin dose can be weaned by Hospice as per Up to Date .

## 2020-06-16 NOTE — Assessment & Plan Note (Signed)
He remains afebrile w/o signs of active infection. High WBC probably demarginaion of WBCs due to acute stress.

## 2020-06-16 NOTE — Assessment & Plan Note (Signed)
Encephalopathy persists w confabulation & inability to provide any meaningful history. Wife seeking POA but he can not provide informed consent for such. This is not expected to change in content of acute on chronic advanced co-morbidites. Legal guardianship may be best option.

## 2020-06-16 NOTE — Assessment & Plan Note (Signed)
Profound AKI/uremia complicates supplementation

## 2020-07-01 ENCOUNTER — Ambulatory Visit: Payer: Medicare Other

## 2020-07-01 ENCOUNTER — Ambulatory Visit: Payer: Medicare Other | Admitting: Internal Medicine

## 2020-08-03 DEATH — deceased
